# Patient Record
Sex: Male | Born: 1945 | Race: White | Hispanic: No | Marital: Married | Smoking: Current some day smoker
Health system: Southern US, Community
[De-identification: ages and names within clinical notes are randomized; demographics above are authoritative.]

## PROBLEM LIST (undated history)

## (undated) DIAGNOSIS — K5730 Diverticulosis of large intestine without perforation or abscess without bleeding: Secondary | ICD-10-CM

## (undated) DIAGNOSIS — I4891 Unspecified atrial fibrillation: Secondary | ICD-10-CM

## (undated) HISTORY — PX: ATRIAL FIBRILLATION ABLATION: SHX5732

## (undated) HISTORY — PX: VALVE REPLACEMENT: SUR13

## (undated) HISTORY — PX: HERNIA REPAIR: SHX51

---

## 1999-04-01 ENCOUNTER — Inpatient Hospital Stay (HOSPITAL_COMMUNITY): Admission: RE | Admit: 1999-04-01 | Discharge: 1999-04-07 | Payer: Self-pay | Admitting: General Surgery

## 1999-04-01 ENCOUNTER — Encounter: Payer: Self-pay | Admitting: General Surgery

## 1999-04-03 HISTORY — PX: LAPAROSCOPIC INGUINAL HERNIA REPAIR: SUR788

## 2002-11-13 ENCOUNTER — Encounter: Payer: Self-pay | Admitting: Orthopedic Surgery

## 2002-11-13 ENCOUNTER — Ambulatory Visit (HOSPITAL_COMMUNITY): Admission: RE | Admit: 2002-11-13 | Discharge: 2002-11-13 | Payer: Self-pay | Admitting: Orthopedic Surgery

## 2005-04-16 DIAGNOSIS — K56699 Other intestinal obstruction unspecified as to partial versus complete obstruction: Secondary | ICD-10-CM

## 2005-04-16 HISTORY — DX: Other intestinal obstruction unspecified as to partial versus complete obstruction: K56.699

## 2005-05-29 ENCOUNTER — Encounter: Admission: RE | Admit: 2005-05-29 | Discharge: 2005-05-29 | Payer: Self-pay | Admitting: Gastroenterology

## 2005-06-08 ENCOUNTER — Encounter (INDEPENDENT_AMBULATORY_CARE_PROVIDER_SITE_OTHER): Payer: Self-pay | Admitting: Specialist

## 2005-06-08 ENCOUNTER — Ambulatory Visit (HOSPITAL_COMMUNITY): Admission: RE | Admit: 2005-06-08 | Discharge: 2005-06-08 | Payer: Self-pay | Admitting: Gastroenterology

## 2005-07-24 ENCOUNTER — Inpatient Hospital Stay (HOSPITAL_COMMUNITY): Admission: RE | Admit: 2005-07-24 | Discharge: 2005-08-03 | Payer: Self-pay | Admitting: Surgery

## 2005-07-26 ENCOUNTER — Encounter (INDEPENDENT_AMBULATORY_CARE_PROVIDER_SITE_OTHER): Payer: Self-pay | Admitting: *Deleted

## 2005-07-26 HISTORY — PX: COLON RESECTION SIGMOID: SHX6737

## 2009-04-16 DIAGNOSIS — K8020 Calculus of gallbladder without cholecystitis without obstruction: Secondary | ICD-10-CM

## 2009-04-16 HISTORY — DX: Calculus of gallbladder without cholecystitis without obstruction: K80.20

## 2009-12-28 ENCOUNTER — Inpatient Hospital Stay (HOSPITAL_COMMUNITY): Admission: EM | Admit: 2009-12-28 | Discharge: 2009-12-29 | Payer: Self-pay | Admitting: Emergency Medicine

## 2010-06-29 LAB — T4, FREE: Free T4: 1.16 ng/dL (ref 0.80–1.80)

## 2010-06-29 LAB — COMPREHENSIVE METABOLIC PANEL
Albumin: 3.3 g/dL — ABNORMAL LOW (ref 3.5–5.2)
Albumin: 4.7 g/dL (ref 3.5–5.2)
BUN: 23 mg/dL (ref 6–23)
BUN: 23 mg/dL (ref 6–23)
Chloride: 95 mEq/L — ABNORMAL LOW (ref 96–112)
Creatinine, Ser: 1.42 mg/dL (ref 0.4–1.5)
Creatinine, Ser: 1.55 mg/dL — ABNORMAL HIGH (ref 0.4–1.5)
GFR calc Af Amer: >60 mL/min (ref >60)
GFR calc non Af Amer: 46 mL/min — ABNORMAL LOW (ref >60)
Potassium: 3.8 mEq/L (ref 3.5–5.1)
Total Bilirubin: 2.9 mg/dL — ABNORMAL HIGH (ref 0.3–1.2)
Total Protein: 6 g/dL (ref 6.0–8.3)

## 2010-06-29 LAB — URINALYSIS, ROUTINE W REFLEX MICROSCOPIC
Leukocytes, UA: NEGATIVE
Protein, ur: 30 mg/dL — AB
Urobilinogen, UA: 0.2 mg/dL (ref 0.0–1.0)

## 2010-06-29 LAB — CBC
MCH: 32.1 pg (ref 26.0–34.0)
MCH: 32 pg (ref 26.0–34.0)
MCV: 91.8 fL (ref 78.0–100.0)
MCV: 90.4 fL (ref 78.0–100.0)
Platelets: 131 10*3/uL — ABNORMAL LOW (ref 150–400)
Platelets: 172 10*3/uL (ref 150–400)
RBC: 5.88 MIL/uL — ABNORMAL HIGH (ref 4.22–5.81)
RDW: 13.6 % (ref 11.5–15.5)
WBC: 10 10*3/uL (ref 4.0–10.5)

## 2010-06-29 LAB — CULTURE, BLOOD (ROUTINE X 2): Culture  Setup Time: 201109150239

## 2010-06-29 LAB — DIFFERENTIAL
Basophils Absolute: 0 10*3/uL (ref 0.0–0.1)
Lymphocytes Relative: 18 % (ref 12–46)
Lymphocytes Relative: 7 % — ABNORMAL LOW (ref 12–46)
Monocytes Absolute: 0.9 10*3/uL (ref 0.1–1.0)
Monocytes Absolute: 1 10*3/uL (ref 0.1–1.0)
Monocytes Relative: 9 % (ref 3–12)
Neutro Abs: 7.3 10*3/uL (ref 1.7–7.7)
Neutro Abs: 17.3 10*3/uL — ABNORMAL HIGH (ref 1.7–7.7)
Neutrophils Relative %: 88 % — ABNORMAL HIGH (ref 43–77)

## 2010-06-29 LAB — PHOSPHORUS: Phosphorus: 1.9 mg/dL — ABNORMAL LOW (ref 2.3–4.6)

## 2010-06-29 LAB — PROTIME-INR
INR: 3.5 — ABNORMAL HIGH (ref 0.00–1.49)
INR: 2.47 — ABNORMAL HIGH (ref 0.00–1.49)
Prothrombin Time: 35.1 seconds — ABNORMAL HIGH (ref 11.6–15.2)
Prothrombin Time: 26.9 seconds — ABNORMAL HIGH (ref 11.6–15.2)

## 2010-06-29 LAB — TSH: TSH: 1.232 u[IU]/mL (ref 0.350–4.500)

## 2010-06-29 LAB — HEMOGLOBIN A1C
Hgb A1c MFr Bld: 5.4 % (ref <5.7)
Mean Plasma Glucose: 108 mg/dL (ref <117)

## 2010-06-29 LAB — URINE MICROSCOPIC-ADD ON

## 2010-06-29 LAB — PROCALCITONIN: Procalcitonin: 3.61 ng/mL

## 2010-06-29 LAB — LIPASE, BLOOD: Lipase: 24 U/L (ref 11–59)

## 2010-06-29 LAB — APTT: aPTT: 51 seconds — ABNORMAL HIGH (ref 24–37)

## 2010-09-01 NOTE — Op Note (Signed)
NAME:  Alan Willis, Alan Willis NO.:  [id number]   MEDICAL RECORD NO.:  [id number]          PATIENT TYPE:  INP   LOCATION:  5740                         FACILITY:  [facility]   PHYSICIAN:  Velora Heckler, MD      DATE OF BIRTH:  [birth year]   DATE OF PROCEDURE:  07/26/2005  DATE OF DISCHARGE:                                 OPERATIVE REPORT   PREOPERATIVE DIAGNOSIS:  Sigmoid colon stricture.   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   PROCEDURE:  Sigmoid colectomy.   SURGEON:  Velora Heckler, M.D., FACS   ASSISTANT:  Leonie Man, M.D.   ANESTHESIA:  General per Dr. Bedelia Person.   ESTIMATED BLOOD LOSS:  200 mL.   PREPARATION:  Betadine.   COMPLICATIONS:  None.   INDICATIONS:  The patient is a 65 year old white male from [place], [place], referred by Graylin Shiver, M.D. for sigmoid colectomy.  The  patient had had episodes of lower abdominal pain in November and December  2006.  The patient underwent screening virtual colonoscopy and sigmoidoscopy  by Dr. Evette Cristal.  This showed a significant stricture of the distal sigmoid  colon at approximately 35 cm from the anus, worrisome for malignancy.  However, biopsies were negative.  The patient now comes to surgery for  resection.   BODY OF REPORT.:  The procedure was done in OR #1 at the [facility].  The patient is brought to the operating room and placed  a in supine position on the operating room table.  Following administration  of general anesthesia, the patient is prepped and draped in the usual strict  aseptic fashion.  After ascertaining that an adequate level of anesthesia  had been obtained, a midline abdominal incision was made with a #10 blade.  Dissection is carried down through subcutaneous tissues.  Fascia is incised  in the midline and the peritoneal cavity is entered cautiously.  At the  inferior-most extent of the incision, a titanium  tack is encountered.  Mesh  material is encountered in the midline consistent with previous bilateral  laparoscopic inguinal hernia repair.  A Balfour retractor is placed for  exposure.  The abdomen is explored.  Adhesions are lysed.  Sigmoid colon  appears moderately dilated.  It then courses towards the retroperitoneum,  where it is densely adherent at the pelvic brim.  Small bowel loops are also  involved with this inflammatory process.  Using gentle blunt dissection and  limited sharp dissection, this area is separated.  Small bowel mesentery is  mobilized off of the inflammatory mass.  Distal terminal ileum is also  mobilized by incising the retroperitoneum.  The mass is then gently  dissected free from the sacral promontory and mobilized.  This all appears  to be a dense inflammatory reaction encompassing about 15 cm of sigmoid  colon.  A point proximal to the inflammatory process is selected and the  bowel is transected between bowel clamps.  Mesentery is taken down between  [facility] clamps, divided and ligated with 2-0 silk ties and 2-0  silk suture  ligatures.  Dissection is carried along the mesentery distally to posterior  to the inflammatory mass.  Mesentery is markedly thickened at this level.  However, dissection does proceed past the mass to what appears to be normal  distal sigmoid colon at the peritoneal reflection.  Remaining mesentery is  divided between [place]idge clamps and ligated with 2-0 silk ties.  The bowel is  then elevated between stay sutures and a Satinsky clamp is placed below the  inflammatory mass and the bowel is transected with a #10 blade.  Specimen is  passed off the field and submitted to pathology, where Dr. Tammi Sou  confirms an inflammatory process consistent with diverticular disease.  No  evidence of malignancy is identified on gross examination.   Next, an end-to-end anastomosis is created between the proximal sigmoid  colon and distal sigmoid colon.   This is done with a single layer of  interrupted 2-0 silk sutures.  Good approximation is obtained.  There is no  tension on the anastomosis.  Mesenteric defect is closed with interrupted 2-  0 silk sutures.  Abdomen is copiously irrigated with warm saline, which is  evacuated.  Good hemostasis was noted.  A sheet of Surgicel is placed over  the area on the retroperitoneum where the inflammatory mass had been  adherent to achieve complete hemostasis.  Abdomen is copiously irrigated  with warm saline, which was evacuated.  Viscera are returned to their normal  location.  All packs are removed.  Omentum is used to cover the small bowel.  Midline incision is closed with a running #1 Novofil suture.  Subcutaneous  tissues are irrigated.  Skin is closed with stainless steel staples.  Sterile dressings are applied.  The patient is awakened from anesthesia and  brought to the recovery room in stable condition.  The patient tolerated the  procedure well.      Velora Heckler, MD  Electronically Signed     TMG/MEDQ  D:  07/26/2005  T:  07/27/2005  Job:  [id number]   cc:   Loney Loh, M.D.  Cardiology/Internal Medicine Division  [facility]   Graylin Shiver, M.D.  Fax: [phone]   Dellis Anes. Idell Pickles, M.D.  Fax: [phone]

## 2010-09-01 NOTE — Op Note (Signed)
NAME:  Alan Willis, ENGELMANN NO.:  [id number]   MEDICAL RECORD NO.:  [id number]          PATIENT TYPE:  AMB   LOCATION:  ENDO                         FACILITY:  [facility]   PHYSICIAN:  Graylin Shiver, M.D.   DATE OF BIRTH:  [birth year]   DATE OF PROCEDURE:  06/08/2005  DATE OF DISCHARGE:                                 OPERATIVE REPORT   PROCEDURE:  Flexible sigmoidoscopy with biopsy.   INDICATIONS FOR PROCEDURE:  The patient is a 65 year old male who underwent  a screening virtual colonoscopy. This virtual colonoscopy showed a  constricting lesion in the sigmoid colon and a flexible sigmoidoscopy is  being done today to further evaluate. The constricting lesion in the sigmoid  on the virtual colonoscopy was suspicious for malignancy and I discussed  this with the patient and we proceeded with flexible sigmoidoscopy. The  patient is on Coumadin and he remained on it for this procedure. 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   Informed consent was obtained after explanation of the risks of bleeding,  infection, and perforation.   The patient was premedicated as stated on the medication review chart.   PROCEDURE:  With the patient in the left lateral decubitus position, a  rectal exam was performed and no masses were felt. The Olympus pediatric  adjustable colonoscope was inserted into the rectum and advanced into the  sigmoid colon. The scope was advanced to 30 cm. At this point, there was a  stricture noted. There were also numerous diverticula in the area. There was  acute angulation at this area also. There did appear to be some reddish  inflamed appearing mucosa which was biopsied. I did not see an obvious  tumor. However, I cannot get into the strictured area nor can I see above  it. I feel that this area must be considered malignant until proven  otherwise.  No lesions were seen in the rectum. He tolerated the procedure  well without complications.   IMPRESSION:  1.  Diverticulosis.  2.  Stricture at 30 cm with some inflamed mucosa at this area. Biopsies were      obtained for histology. I palpated the mucosa in this area, it did not      feel hard. I cannot see into the strictured area nor can I see above it.      I feel that this area must be considered malignant until proven      otherwise and will therefore refer the patient to a surgeon for further      evaluation to include sigmoid resection.           ______________________________  Graylin Shiver, M.D.     SFG/MEDQ  D:  06/08/2005  T:  06/11/2005  Job:  [id number]   cc:   Dellis Anes. Idell Pickles, M.D.  Fax: [phone]

## 2010-09-01 NOTE — Op Note (Signed)
[facility]  Patient:    Alan Willis                    MRN: [id number] Proc. Date: 04/03/99 Adm. Date:  [id number] Attending:  Arlis Porta                           Operative Report  PREOPERATIVE DIAGNOSIS:  Bilateral inguinal hernias.  POSTOPERATIVE DIAGNOSIS:  Bilateral inguinal hernias.  OPERATION PERFORMED:  Laparoscopic bilateral inguinal hernia repair with mesh.  SURGEON:  Adolph Pollack, M.D.  ASSISTANT:  Nurse.  ANESTHESIA:  General.  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  He has had an enlarging left inguinal bulge that is more painful. On exam, he has bilateral inguinal hernias left greater than right and presents for repair.  He was admitted two days early for IV heparinization and was given perioperative endocarditis prophylaxis with ampicillin and gentamicin.  DESCRIPTION OF PROCEDURE:  He was placed supine on the operating table and a general anesthetic was administered.  The lower abdomen was shaved.  The Foley catheter was placed in the bladder.  The lower abdomen was then sterilely prepped and draped.  Subumbilical transverse incision was made incising the skin sharply and carrying this down to the fascia.  A 1 cm incision was made in the right anterior rectus sheath and then the muscle identified and swept laterally creating a plane where I could see the posterior rectus sheath all the way down to the pubis.  Next, the balloon dissector was placed and balloon dissection was performed under direct vision with adequate dissection, the muscle remained anteriorly and the peritoneum was displaced posteriorly along with the preperitoneal fat.  Next, a trocar was introduced and CO2 gas was used to insufflate the extraperitoneal space.  Two 5 mm trocars were placed in the lower midline.  The  right side was approached first.  The symphysis  pubis and Coopers ligament were  identified.  A direct hernia defect was also identified on the right side.  I identified the spermatic cord, dissected this free and noticed that there was no indirect defect.  The left side was then approached.  The Coopers ligament was identified. Inferior epigastric vessels were identified and there was small direct defect.  I did isolate the spermatic cord and there was also an indirect sac present that was dissected free from the spermatic cord all the way back to the level of the anterior superior iliac spine.  A 4 x 6 inch piece of mesh was then placed in the left extraperitoneal space, anchored to Coopers ligament, the anterior abdominal wall and the lateral abdominal wall with the spiral tacker so that it covered the direct, indirect and femoral spaces adequately.  At this point I noticed that there appeared to be a  pneumoperitoneum and the Veress needle was inserted into the peritoneal cavity ith release of some CO2 gas.  Next, I placed a piece of 4 x 6 inch mesh in the right extraperitoneal space and anchored it to the Coopers ligament, the anterior abdominal wall and the lateral abdominal wall with the spiral tacker.  This also covered the direct, indirect nd femoral spaces adequately.  We then made sure the inferior lateral portions of ach of the meshes was in place and the CO2 gas was released and the extraperitoneal fat was allowed to  approximate with the mesh in the rectus muscle.  The fascial defect in the anterior rectus sheath on the right side was then closed with interrupted 0 Vicryl sutures.  The skin incisions were closed with 4-0 Monocryl subcuticular stitches.  Upon emerging from the anesthetic, the patient strained, sat up and grunted and a loud pop was heard.  We subsequently reinstituted a general anesthetic, reprepped and draped the abdomen and explored the subumbilical wound which demonstrated the sutures  had disrupted.  I then reclosed the fascia layer with interrupted 0 PDS  sutures close together.  I then irrigated this out with antibiotic solution and  reclosed the skin with a running 4-0 Monocryl subcuticular stitch followed by a  sterile dressing.  He then emerged from the anesthetic calmly and without complication.  He was then taken to the recovery room in satisfactory condition. DD:  04/03/99 TD:  04/05/99 Job: [id number]

## 2010-09-01 NOTE — Discharge Summary (Signed)
NAME:  Alan Willis, Alan Willis NO.:  [id number]   MEDICAL RECORD NO.:  [id number]          PATIENT TYPE:  INP   LOCATION:  5740                         FACILITY:  [facility]   PHYSICIAN:  Velora Heckler, MD      DATE OF BIRTH:  [birth year]   DATE OF ADMISSION:  07/24/2005  DATE OF DISCHARGE:  08/03/2005                                 DISCHARGE SUMMARY   REASON FOR ADMISSION:  Stricture sigmoid colon, rule out malignancy.   HISTORY OF PRESENT ILLNESS:  Alan Willis is a pleasant 65 year old  white male from [place], [place], referred by Dr. Wandalee Ferdinand for  sigmoid colon stricture. The patient had undergone a complete workup  including virtual colonoscopy and flexible sigmoidoscopy. He was found to  have a tight stricture at 30 cm with associated diverticulosis. Biopsies  were negative. The patient had partial obstructive symptoms. He is admitted  for sigmoid colectomy.   HOSPITAL COURSE:  The patient was admitted prior to surgery due to need for  heparin anticoagulation due to an aortic valve replacement. The patient was  placed on a heparin drip on April10,2007. He underwent a mechanical bowel  prep. He was taken to the operating room on July 26, 2005 where he  underwent sigmoid colectomy without complication. Postoperatively, the  patient was placed back on intravenous heparin. Final pathology showed acute  and chronic diverticulitis with stricture. No malignancy was identified. The  patient's diet was advanced. He was started on clear liquids and advanced to  a regular diet. He resumed his oral Coumadin therapy. The patient's heparin  levels, prothrombin time, and INR were monitored daily. The patient had a  slow gradual rise in his prothrombin time and INR. The patient was prepared  for discharge home on the eighth postoperative day.   DISCHARGE/PLAN:  The patient is discharged home on April 20,2007, in good  condition, tolerating a regular diet, ambulating  independently. The patient  will be seen back in my office at [facility] in 2-3 weeks for  a wound check. The patient will take 7.5 mg of Coumadin tonight and then  begin his regular dose of Coumadin 5 mg daily on Saturday, April 21. The  patient will go to [facility] for a prothrombin time and INR  level on Monday, April 23.   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   CONDITION ON DISCHARGE:  Improved.      Velora Heckler, MD  Electronically Signed     TMG/MEDQ  D:  08/03/2005  T:  08/03/2005  Job:  [id number]   cc:   Dellis Anes. Idell Pickles, M.D.  Fax: [phone]   Graylin Shiver, M.D.  Fax: [phone]   Redge Gainer, M.D.

## 2011-05-21 DIAGNOSIS — Z952 Presence of prosthetic heart valve: Secondary | ICD-10-CM

## 2013-04-03 ENCOUNTER — Other Ambulatory Visit: Payer: Self-pay | Admitting: Family Medicine

## 2013-04-03 DIAGNOSIS — Z87891 Personal history of nicotine dependence: Secondary | ICD-10-CM

## 2013-04-13 ENCOUNTER — Ambulatory Visit
Admission: RE | Admit: 2013-04-13 | Discharge: 2013-04-13 | Disposition: A | Discharge status: Home / Self Care | Payer: Medicare Other | Source: Ambulatory Visit | Attending: Family Medicine | Admitting: Family Medicine

## 2013-04-13 DIAGNOSIS — Z87891 Personal history of nicotine dependence: Secondary | ICD-10-CM

## 2014-02-17 ENCOUNTER — Other Ambulatory Visit: Payer: Self-pay | Admitting: Dermatology

## 2017-02-05 ENCOUNTER — Other Ambulatory Visit: Payer: Self-pay | Admitting: Dermatology

## 2019-05-11 ENCOUNTER — Ambulatory Visit: Payer: Medicare Other | Attending: Internal Medicine

## 2019-05-11 DIAGNOSIS — Z23 Encounter for immunization: Secondary | ICD-10-CM | POA: Insufficient documentation

## 2019-05-11 NOTE — Progress Notes (Addendum)
   Covid-19 Vaccination Clinic  Name:  Alan Willis    MRN: [id number] DOB: May 06, 1945  05/11/2019  Mr. Badie was observed post Covid-19 immunization for 30 minutes based on pre-vaccination screening without incidence. He was provided with Vaccine Information Sheet and instruction to access the V-Safe system.   Mr. Chriscoe was instructed to call 911 with any severe reactions post vaccine: Marland Kitchen Difficulty breathing  . Swelling of your face and throat  . A fast heartbeat  . A bad rash all over your body  . Dizziness and weakness    Immunizations Administered    Name Date Dose VIS Date Route   Pfizer COVID-19 Vaccine 05/11/2019  8:21 AM 0.3 mL 03/27/2019 Intramuscular   Manufacturer: [employer], [employer]   Lot: [id number]   NDC: [address]-3

## 2019-06-02 ENCOUNTER — Ambulatory Visit: Payer: Medicare Other

## 2019-06-05 ENCOUNTER — Ambulatory Visit: Payer: Medicare Other

## 2019-06-07 ENCOUNTER — Ambulatory Visit: Payer: Medicare Other | Attending: Internal Medicine

## 2019-06-07 DIAGNOSIS — Z23 Encounter for immunization: Secondary | ICD-10-CM | POA: Insufficient documentation

## 2019-06-07 NOTE — Progress Notes (Signed)
   Covid-19 Vaccination Clinic  Name:  Alan Willis    MRN: [id number] DOB: [birth year]  06/07/2019  Alan Willis was observed post Covid-19 immunization for 15 minutes without incidence. He was provided with Vaccine Information Sheet and instruction to access the V-Safe system.   Alan Willis was instructed to call 911 with any severe reactions post vaccine: Marland Kitchen Difficulty breathing  . Swelling of your face and throat  . A fast heartbeat  . A bad rash all over your body  . Dizziness and weakness    Immunizations Administered    Name Date Dose VIS Date Route   Pfizer COVID-19 Vaccine 06/07/2019  8:50 AM 0.3 mL 03/27/2019 Intramuscular   Manufacturer: [employer], [employer]   Lot: [id number]   NDC: [address]-6

## 2019-07-14 DIAGNOSIS — I050 Rheumatic mitral stenosis: Secondary | ICD-10-CM | POA: Insufficient documentation

## 2019-07-14 DIAGNOSIS — I051 Rheumatic mitral insufficiency: Secondary | ICD-10-CM | POA: Insufficient documentation

## 2020-01-08 ENCOUNTER — Ambulatory Visit: Payer: Medicare Other | Attending: Internal Medicine

## 2020-01-08 DIAGNOSIS — Z23 Encounter for immunization: Secondary | ICD-10-CM

## 2020-01-08 NOTE — Progress Notes (Signed)
   Covid-19 Vaccination Clinic  Name:  Alan Willis    MRN: [id number] DOB: 07-07-1945  01/08/2020  Alan Willis was observed post Covid-19 immunization for 15 minutes without incident. He was provided with Vaccine Information Sheet and instruction to access the V-Safe system. Vaccinated by [facility].  Alan Willis was instructed to call 911 with any severe reactions post vaccine: Marland Kitchen Difficulty breathing  . Swelling of face and throat  . A fast heartbeat  . A bad rash all over body  . Dizziness and weakness

## 2020-04-14 ENCOUNTER — Emergency Department (HOSPITAL_BASED_OUTPATIENT_CLINIC_OR_DEPARTMENT_OTHER): Payer: Medicare Other

## 2020-04-14 ENCOUNTER — Encounter (HOSPITAL_BASED_OUTPATIENT_CLINIC_OR_DEPARTMENT_OTHER): Payer: Self-pay

## 2020-04-14 ENCOUNTER — Other Ambulatory Visit: Payer: Self-pay

## 2020-04-14 ENCOUNTER — Emergency Department (HOSPITAL_BASED_OUTPATIENT_CLINIC_OR_DEPARTMENT_OTHER)
Admission: EM | Admit: 2020-04-14 | Discharge: 2020-04-14 | Disposition: A | Discharge status: Home / Self Care | Payer: Medicare Other | Attending: Emergency Medicine | Admitting: Emergency Medicine

## 2020-04-14 DIAGNOSIS — I700 Atherosclerosis of aorta: Secondary | ICD-10-CM | POA: Insufficient documentation

## 2020-04-14 DIAGNOSIS — Z20822 Contact with and (suspected) exposure to covid-19: Secondary | ICD-10-CM | POA: Diagnosis not present

## 2020-04-14 DIAGNOSIS — I4891 Unspecified atrial fibrillation: Secondary | ICD-10-CM | POA: Diagnosis not present

## 2020-04-14 DIAGNOSIS — R404 Transient alteration of awareness: Secondary | ICD-10-CM

## 2020-04-14 DIAGNOSIS — E871 Hypo-osmolality and hyponatremia: Secondary | ICD-10-CM | POA: Diagnosis not present

## 2020-04-14 DIAGNOSIS — Z7901 Long term (current) use of anticoagulants: Secondary | ICD-10-CM | POA: Insufficient documentation

## 2020-04-14 DIAGNOSIS — R6883 Chills (without fever): Secondary | ICD-10-CM

## 2020-04-14 DIAGNOSIS — L0291 Cutaneous abscess, unspecified: Secondary | ICD-10-CM

## 2020-04-14 DIAGNOSIS — R778 Other specified abnormalities of plasma proteins: Secondary | ICD-10-CM | POA: Diagnosis not present

## 2020-04-14 HISTORY — DX: Unspecified atrial fibrillation: I48.91

## 2020-04-14 LAB — CBC
HCT: 39.7 % (ref 39.0–52.0)
Hemoglobin: 13.9 g/dL (ref 13.0–17.0)
MCH: 31.8 pg (ref 26.0–34.0)
MCHC: 35 g/dL (ref 30.0–36.0)
MCV: 90.8 fL (ref 80.0–100.0)
Platelets: 150 10*3/uL (ref 150–400)
RBC: 4.37 MIL/uL (ref 4.22–5.81)
RDW: 13.2 % (ref 11.5–15.5)
WBC: 10 10*3/uL (ref 4.0–10.5)
nRBC: 0 % (ref 0.0–0.2)

## 2020-04-14 LAB — TROPONIN I (HIGH SENSITIVITY)
Troponin I (High Sensitivity): 24 ng/L — ABNORMAL HIGH (ref <18)
Troponin I (High Sensitivity): 25 ng/L — ABNORMAL HIGH (ref <18)

## 2020-04-14 LAB — URINALYSIS, ROUTINE W REFLEX MICROSCOPIC
Bilirubin Urine: NEGATIVE
Glucose, UA: NEGATIVE mg/dL
Hgb urine dipstick: NEGATIVE
Ketones, ur: NEGATIVE mg/dL
Leukocytes,Ua: NEGATIVE
Nitrite: NEGATIVE
Protein, ur: NEGATIVE mg/dL
Specific Gravity, Urine: 1.005 (ref 1.005–1.030)
pH: 6.5 (ref 5.0–8.0)

## 2020-04-14 LAB — CBG MONITORING, ED: Glucose-Capillary: 100 mg/dL — ABNORMAL HIGH (ref 70–99)

## 2020-04-14 LAB — COMPREHENSIVE METABOLIC PANEL
ALT: 19 U/L (ref 0–44)
AST: 31 U/L (ref 15–41)
Albumin: 4.3 g/dL (ref 3.5–5.0)
Alkaline Phosphatase: 42 U/L (ref 38–126)
Anion gap: 10 (ref 5–15)
BUN: 8 mg/dL (ref 8–23)
CO2: 23 mmol/L (ref 22–32)
Calcium: 8.7 mg/dL — ABNORMAL LOW (ref 8.9–10.3)
Chloride: 92 mmol/L — ABNORMAL LOW (ref 98–111)
Creatinine, Ser: 1.02 mg/dL (ref 0.61–1.24)
GFR, Estimated: >60 mL/min (ref >60)
Glucose, Bld: 94 mg/dL (ref 70–99)
Potassium: 3.9 mmol/L (ref 3.5–5.1)
Sodium: 125 mmol/L — ABNORMAL LOW (ref 135–145)
Total Bilirubin: 1.8 mg/dL — ABNORMAL HIGH (ref 0.3–1.2)
Total Protein: 6.6 g/dL (ref 6.5–8.1)

## 2020-04-14 LAB — PROTIME-INR
INR: 1.7 — ABNORMAL HIGH (ref 0.8–1.2)
Prothrombin Time: 19.1 seconds — ABNORMAL HIGH (ref 11.4–15.2)

## 2020-04-14 LAB — SARS CORONAVIRUS 2 (TAT 6-24 HRS): SARS Coronavirus 2: NEGATIVE

## 2020-04-14 MED ORDER — SODIUM CHLORIDE 0.9 % IV SOLN: INTRAVENOUS | Status: DC

## 2020-04-14 MED ORDER — SODIUM CHLORIDE 0.9 % IV BOLUS
500.0000 mL | Freq: Once | INTRAVENOUS | Status: AC
  Administered 2020-04-14: 500 mL via INTRAVENOUS

## 2020-04-14 MED ORDER — IOHEXOL 300 MG/ML  SOLN
100.0000 mL | Freq: Once | INTRAMUSCULAR | Status: AC | PRN
  Administered 2020-04-14: 100 mL via INTRAVENOUS

## 2020-04-14 NOTE — ED Notes (Signed)
Pt. And family were made aware that a urine sample is needed, but could not provide one right now

## 2020-04-14 NOTE — Discharge Instructions (Addendum)
Today received some salt water help bring her sodium up.  You could add some salt to your food.  Just since you are having trouble with the sodium being low.  Would recommend following back up with your primary care doctor and also consideration for an endocrinology evaluation.  Would also consider MRI brain.  And recheck of your sodium.

## 2020-04-14 NOTE — ED Notes (Signed)
PT to CT.

## 2020-04-14 NOTE — ED Notes (Addendum)
Pt had symptoms of flu 10 days ago 12/23 neg covid.  Excessive thirst, chills and confusion.  Been to [facility] walkin clinic, uti negative. Hypothyroid, diabetes all negative.  Low sodium. Pro time is out of balance. Afib in April. Has artificial valve. Pt also complains of headache taking tylenol with relieves pain

## 2020-04-14 NOTE — ED Notes (Signed)
Pt is on the monitor and cycling vitals

## 2020-04-14 NOTE — ED Triage Notes (Signed)
Pt states "I've been having a lot of confusion and running a fever" x 1 week-alert to name/DOB/ place/date-NAD-steady gait-wife states pt with chills/no known fever-states pt has been seen by PCP yesterday and today-states unable to find cause for c/o-neg covid test 12/23 and states pt had flu like sx at that time

## 2020-04-14 NOTE — ED Provider Notes (Addendum)
[facility] EMERGENCY DEPARTMENT Provider Note   CSN: [id number] Arrival date & time: 04/14/20  1128     History Chief Complaint  Patient presents with  . Altered Mental Status    Alan Willis is a 74 y.o. male.  Patient has been seen frequently by [facility] physicians.  Trying to sort out his complaints and his persistent hyponatremia.  Patient had a negative Covid test on December 23.  Patient symptoms have been ongoing for 10 days.  Said excessive thirst chills episodic confusion other times completely normal.  His work-up at [facility] physicians has been negative for UTI hypothyroid diabetes.  Has had some persistent low sodium which they have been able to explain.  His INR was elevated and they stopped his Coumadin.  He just recently restarted that.  Patient has a history of atrial fibrillation in April and has an artificial valve.  Patient had also has complaints of headache taking Tylenol which relieves the pain.  But they did her headaches do come and go.  [facility] physicians recommended that he come in here for additional work-up.  No specific instructions.        Past Medical History:  Diagnosis Date  . A-fib (HCC)     There are no problems to display for this patient.   Past Surgical History:  Procedure Laterality Date  . ATRIAL FIBRILLATION ABLATION    . HERNIA REPAIR    . VALVE REPLACEMENT         No family history on file.  Social History   Tobacco Use  . Smoking status: Former Games developer  . Smokeless tobacco: Never Used  Substance Use Topics  . Alcohol use: Yes    Comment: daily  . Drug use: Never    Home Medications Prior to Admission medications   Medication Sig Start Date End Date Taking? Authorizing Provider  warfarin (COUMADIN) 6 MG tablet Take 6 mg by mouth daily. Friday, Saturday Sunday retest monday   Yes [provider]    Allergies    Codeine  Review of Systems   Review of Systems  Constitutional: Positive for chills.  Negative for fever.  HENT: Negative for congestion, rhinorrhea and sore throat.   Eyes: Negative for visual disturbance.  Respiratory: Negative for cough and shortness of breath.   Cardiovascular: Negative for chest pain and leg swelling.  Gastrointestinal: Negative for abdominal pain, diarrhea, nausea and vomiting.  Endocrine: Positive for polyphagia and polyuria.  Genitourinary: Negative for dysuria.  Musculoskeletal: Negative for back pain and neck pain.  Skin: Negative for rash.  Neurological: Positive for headaches. Negative for dizziness and light-headedness.  Hematological: Does not bruise/bleed easily.  Psychiatric/Behavioral: Positive for confusion.    Physical Exam Updated Vital Signs BP 132/72 (BP Location: Right Arm)   Pulse (!) 50   Temp 97.8 F (36.6 C) (Oral)   Resp 18   Ht 1.778 m (5\' 10" )   Wt 78 kg   SpO2 100%   BMI 24.68 kg/m   Physical Exam Vitals and nursing note reviewed.  Constitutional:      Appearance: Normal appearance. He is well-developed and well-nourished.  HENT:     Head: Normocephalic and atraumatic.     Mouth/Throat:     Mouth: Mucous membranes are moist.  Eyes:     Extraocular Movements: Extraocular movements intact.     Conjunctiva/sclera: Conjunctivae normal.     Pupils: Pupils are equal, round, and reactive to light.  Cardiovascular:     Rate and  Rhythm: Normal rate and regular rhythm.     Heart sounds: No murmur heard.   Pulmonary:     Effort: Pulmonary effort is normal. No respiratory distress.     Breath sounds: Normal breath sounds.  Abdominal:     Palpations: Abdomen is soft.     Tenderness: There is no abdominal tenderness.  Musculoskeletal:        General: No edema. Normal range of motion.     Cervical back: Normal range of motion and neck supple.  Skin:    General: Skin is warm and dry.     Capillary Refill: Capillary refill takes less than 2 seconds.  Neurological:     General: No focal deficit present.      Mental Status: He is alert and oriented to person, place, and time.     Cranial Nerves: No cranial nerve deficit.     Sensory: No sensory deficit.     Motor: No weakness.     Coordination: Coordination normal.  Psychiatric:        Mood and Affect: Mood and affect normal.     ED Results / Procedures / Treatments   Labs (all labs ordered are listed, but only abnormal results are displayed) Labs Reviewed  COMPREHENSIVE METABOLIC PANEL - Abnormal; Notable for the following components:      Result Value   Sodium 125 (*)    Chloride 92 (*)    Calcium 8.7 (*)    Total Bilirubin 1.8 (*)    All other components within normal limits  CBG MONITORING, ED - Abnormal; Notable for the following components:   Glucose-Capillary 100 (*)    All other components within normal limits  TROPONIN I (HIGH SENSITIVITY) - Abnormal; Notable for the following components:   Troponin I (High Sensitivity) 24 (*)    All other components within normal limits  SARS CORONAVIRUS 2 (TAT 6-24 HRS)  CBC  URINALYSIS, ROUTINE W REFLEX MICROSCOPIC  PROTIME-INR    EKG EKG Interpretation  Date/Time:  Thursday April 14 2020 11:51:45 EST Ventricular Rate:  53 PR Interval:  240 QRS Duration: 128 QT Interval:  456 QTC Calculation: 427 R Axis:   -42 Text Interpretation: Sinus bradycardia with 1st degree A-V block Left axis deviation Non-specific intra-ventricular conduction block Minimal voltage criteria for LVH, may be normal variant ( Cornell product ) Abnormal ECG ST elevation in V3-4 Otherwise no significant change Confirmed by Vanetta Mulders [phone]) on 04/14/2020 12:29:12 PM   Radiology DG Chest Port 1 View  Result Date: 04/14/2020 CLINICAL DATA:  Confusion, fever EXAM: PORTABLE CHEST 1 VIEW COMPARISON:  07/24/2005 FINDINGS: No focal consolidation. No pleural effusion or pneumothorax. Heart and mediastinal contours are unremarkable. Prior median sternotomy. No acute osseous abnormality. IMPRESSION: No  active disease. Electronically Signed   By: Elige Ko   On: 04/14/2020 12:50    Procedures Procedures (including critical care time)  Medications Ordered in ED Medications  0.9 %  sodium chloride infusion (has no administration in time range)  sodium chloride 0.9 % bolus 500 mL (500 mLs Intravenous New Bag/Given 04/14/20 1355)    ED Course  I have reviewed the triage vital signs and the nursing notes.  Pertinent labs & imaging results that were available during my care of the patient were reviewed by me and considered in my medical decision making (see chart for details).    MDM Rules/Calculators/A&P  Patient's work-up here troponins x2 without any acute findings.  His INR is 1.7 still little below therapeutic but he is just recently been started back on his Coumadin.  Sodium is 125 definitely low.  But necessarily in a totally critical level.  And his confusion and mental status changes are not persistent they are intermittent.  No good explanation for the hyponatremia.  Not on a diuretic.  But has had the increased urination and thirst.  Assume he was sent in for radiology studies by primary care so we will do chest x-ray which was done no acute findings on that.  CT head has no acute findings.  CT chest abdomen pelvis still pending.  If no significant findings on that would recommend follow back up to [facility] physicians may be consider endocrine follow-up.  Since patient's last Covid test was on the 23rd.  Have ordered the repeat on the 6 to 12-hour test.  This to be complete.  Patient currently nontoxic no acute distress  CT chest abdomen pelvis without any acute findings to explain the symptomatology.  Patient stable for discharge back.  Repeat Covid test is pending and they can follow that up on MyChart.  Will recommend going back to primary care doctor consideration for MRI brain and may be an endocrinology referral.  Since patient's mental status is  fine currently.  Do not feel that he needs admission for the hyponatremia.  Certainly has no depressed mental status.  And the mental status changes are episodic in nature.   Final Clinical Impression(s) / ED Diagnoses Final diagnoses:  Transient alteration of awareness  Hyponatremia  Chills (without fever)  Elevated troponin    Rx / DC Orders ED Discharge Orders    None       Vanetta Mulders, MD 04/14/20 1526    Vanetta Mulders, MD 04/14/20 1538    Vanetta Mulders, MD 04/14/20 [phone]

## 2020-04-18 DIAGNOSIS — Z952 Presence of prosthetic heart valve: Secondary | ICD-10-CM | POA: Diagnosis not present

## 2020-04-18 DIAGNOSIS — E871 Hypo-osmolality and hyponatremia: Secondary | ICD-10-CM | POA: Diagnosis not present

## 2020-04-18 DIAGNOSIS — R631 Polydipsia: Secondary | ICD-10-CM | POA: Diagnosis not present

## 2020-04-18 DIAGNOSIS — Z7901 Long term (current) use of anticoagulants: Secondary | ICD-10-CM | POA: Diagnosis not present

## 2020-04-25 DIAGNOSIS — Z952 Presence of prosthetic heart valve: Secondary | ICD-10-CM | POA: Diagnosis not present

## 2020-04-25 DIAGNOSIS — E871 Hypo-osmolality and hyponatremia: Secondary | ICD-10-CM | POA: Diagnosis not present

## 2020-04-25 DIAGNOSIS — N1831 Chronic kidney disease, stage 3a: Secondary | ICD-10-CM | POA: Diagnosis not present

## 2020-04-25 DIAGNOSIS — R413 Other amnesia: Secondary | ICD-10-CM | POA: Diagnosis not present

## 2020-05-16 DIAGNOSIS — Z7901 Long term (current) use of anticoagulants: Secondary | ICD-10-CM | POA: Diagnosis not present

## 2020-05-16 DIAGNOSIS — E871 Hypo-osmolality and hyponatremia: Secondary | ICD-10-CM | POA: Diagnosis not present

## 2020-06-13 DIAGNOSIS — Z7901 Long term (current) use of anticoagulants: Secondary | ICD-10-CM | POA: Diagnosis not present

## 2020-06-15 DIAGNOSIS — R413 Other amnesia: Secondary | ICD-10-CM | POA: Diagnosis not present

## 2020-06-15 DIAGNOSIS — R631 Polydipsia: Secondary | ICD-10-CM | POA: Diagnosis not present

## 2020-06-15 DIAGNOSIS — E871 Hypo-osmolality and hyponatremia: Secondary | ICD-10-CM | POA: Diagnosis not present

## 2020-06-15 DIAGNOSIS — N1831 Chronic kidney disease, stage 3a: Secondary | ICD-10-CM | POA: Diagnosis not present

## 2020-06-20 DIAGNOSIS — Z7901 Long term (current) use of anticoagulants: Secondary | ICD-10-CM | POA: Diagnosis not present

## 2020-07-11 DIAGNOSIS — Z7901 Long term (current) use of anticoagulants: Secondary | ICD-10-CM | POA: Diagnosis not present

## 2020-07-26 DIAGNOSIS — Z952 Presence of prosthetic heart valve: Secondary | ICD-10-CM | POA: Diagnosis not present

## 2020-07-26 DIAGNOSIS — E871 Hypo-osmolality and hyponatremia: Secondary | ICD-10-CM | POA: Diagnosis not present

## 2020-07-26 DIAGNOSIS — R413 Other amnesia: Secondary | ICD-10-CM | POA: Diagnosis not present

## 2020-07-26 DIAGNOSIS — Z1211 Encounter for screening for malignant neoplasm of colon: Secondary | ICD-10-CM | POA: Diagnosis not present

## 2020-07-26 DIAGNOSIS — N1831 Chronic kidney disease, stage 3a: Secondary | ICD-10-CM | POA: Diagnosis not present

## 2020-07-26 DIAGNOSIS — Z7901 Long term (current) use of anticoagulants: Secondary | ICD-10-CM | POA: Diagnosis not present

## 2020-08-03 DIAGNOSIS — Z7901 Long term (current) use of anticoagulants: Secondary | ICD-10-CM | POA: Diagnosis not present

## 2020-08-12 DIAGNOSIS — Z7901 Long term (current) use of anticoagulants: Secondary | ICD-10-CM | POA: Diagnosis not present

## 2020-08-16 DIAGNOSIS — Z7901 Long term (current) use of anticoagulants: Secondary | ICD-10-CM | POA: Diagnosis not present

## 2020-08-16 DIAGNOSIS — E871 Hypo-osmolality and hyponatremia: Secondary | ICD-10-CM | POA: Diagnosis not present

## 2020-08-16 DIAGNOSIS — Z952 Presence of prosthetic heart valve: Secondary | ICD-10-CM | POA: Diagnosis not present

## 2020-08-16 DIAGNOSIS — I4892 Unspecified atrial flutter: Secondary | ICD-10-CM | POA: Diagnosis not present

## 2020-08-16 DIAGNOSIS — I052 Rheumatic mitral stenosis with insufficiency: Secondary | ICD-10-CM | POA: Diagnosis not present

## 2020-08-16 DIAGNOSIS — I483 Typical atrial flutter: Secondary | ICD-10-CM | POA: Diagnosis not present

## 2020-08-19 DIAGNOSIS — Z7901 Long term (current) use of anticoagulants: Secondary | ICD-10-CM | POA: Diagnosis not present

## 2020-08-22 DIAGNOSIS — Z7901 Long term (current) use of anticoagulants: Secondary | ICD-10-CM | POA: Diagnosis not present

## 2020-08-22 DIAGNOSIS — E871 Hypo-osmolality and hyponatremia: Secondary | ICD-10-CM | POA: Diagnosis not present

## 2020-08-23 DIAGNOSIS — Z0000 Encounter for general adult medical examination without abnormal findings: Secondary | ICD-10-CM | POA: Diagnosis not present

## 2020-08-29 DIAGNOSIS — Z952 Presence of prosthetic heart valve: Secondary | ICD-10-CM | POA: Diagnosis not present

## 2020-08-29 DIAGNOSIS — I440 Atrioventricular block, first degree: Secondary | ICD-10-CM | POA: Diagnosis not present

## 2020-08-29 DIAGNOSIS — Z7901 Long term (current) use of anticoagulants: Secondary | ICD-10-CM | POA: Diagnosis not present

## 2020-08-29 DIAGNOSIS — R001 Bradycardia, unspecified: Secondary | ICD-10-CM | POA: Diagnosis not present

## 2020-08-29 DIAGNOSIS — I444 Left anterior fascicular block: Secondary | ICD-10-CM | POA: Diagnosis not present

## 2020-08-29 DIAGNOSIS — E871 Hypo-osmolality and hyponatremia: Secondary | ICD-10-CM | POA: Diagnosis not present

## 2020-08-29 DIAGNOSIS — I4892 Unspecified atrial flutter: Secondary | ICD-10-CM | POA: Diagnosis not present

## 2020-08-29 DIAGNOSIS — I340 Nonrheumatic mitral (valve) insufficiency: Secondary | ICD-10-CM | POA: Diagnosis not present

## 2020-08-30 DIAGNOSIS — Z7901 Long term (current) use of anticoagulants: Secondary | ICD-10-CM | POA: Diagnosis not present

## 2020-08-31 DIAGNOSIS — R001 Bradycardia, unspecified: Secondary | ICD-10-CM | POA: Diagnosis not present

## 2020-08-31 DIAGNOSIS — I444 Left anterior fascicular block: Secondary | ICD-10-CM | POA: Diagnosis not present

## 2020-08-31 DIAGNOSIS — I440 Atrioventricular block, first degree: Secondary | ICD-10-CM | POA: Diagnosis not present

## 2020-09-06 DIAGNOSIS — Z7901 Long term (current) use of anticoagulants: Secondary | ICD-10-CM | POA: Diagnosis not present

## 2020-09-13 DIAGNOSIS — Z7901 Long term (current) use of anticoagulants: Secondary | ICD-10-CM | POA: Diagnosis not present

## 2020-09-15 DIAGNOSIS — R6883 Chills (without fever): Secondary | ICD-10-CM | POA: Diagnosis not present

## 2020-09-15 DIAGNOSIS — R631 Polydipsia: Secondary | ICD-10-CM | POA: Diagnosis not present

## 2020-09-15 DIAGNOSIS — R519 Headache, unspecified: Secondary | ICD-10-CM | POA: Diagnosis not present

## 2020-09-15 DIAGNOSIS — E871 Hypo-osmolality and hyponatremia: Secondary | ICD-10-CM | POA: Diagnosis not present

## 2020-09-15 DIAGNOSIS — Z8349 Family history of other endocrine, nutritional and metabolic diseases: Secondary | ICD-10-CM | POA: Diagnosis not present

## 2020-09-19 DIAGNOSIS — E871 Hypo-osmolality and hyponatremia: Secondary | ICD-10-CM | POA: Diagnosis not present

## 2020-09-19 DIAGNOSIS — Z8349 Family history of other endocrine, nutritional and metabolic diseases: Secondary | ICD-10-CM | POA: Diagnosis not present

## 2020-09-19 DIAGNOSIS — R519 Headache, unspecified: Secondary | ICD-10-CM | POA: Diagnosis not present

## 2020-09-19 DIAGNOSIS — R6883 Chills (without fever): Secondary | ICD-10-CM | POA: Diagnosis not present

## 2020-09-26 DIAGNOSIS — Z7901 Long term (current) use of anticoagulants: Secondary | ICD-10-CM | POA: Diagnosis not present

## 2020-09-30 DIAGNOSIS — Z7901 Long term (current) use of anticoagulants: Secondary | ICD-10-CM | POA: Diagnosis not present

## 2020-10-05 DIAGNOSIS — R631 Polydipsia: Secondary | ICD-10-CM | POA: Diagnosis not present

## 2020-10-05 DIAGNOSIS — R519 Headache, unspecified: Secondary | ICD-10-CM | POA: Diagnosis not present

## 2020-10-10 DIAGNOSIS — Z7901 Long term (current) use of anticoagulants: Secondary | ICD-10-CM | POA: Diagnosis not present

## 2020-10-14 DIAGNOSIS — Z7901 Long term (current) use of anticoagulants: Secondary | ICD-10-CM | POA: Diagnosis not present

## 2020-10-21 DIAGNOSIS — Z7901 Long term (current) use of anticoagulants: Secondary | ICD-10-CM | POA: Diagnosis not present

## 2020-10-31 DIAGNOSIS — Z7901 Long term (current) use of anticoagulants: Secondary | ICD-10-CM | POA: Diagnosis not present

## 2020-11-02 ENCOUNTER — Other Ambulatory Visit: Payer: Self-pay | Admitting: Internal Medicine

## 2020-11-02 DIAGNOSIS — R631 Polydipsia: Secondary | ICD-10-CM

## 2020-11-02 DIAGNOSIS — R519 Headache, unspecified: Secondary | ICD-10-CM

## 2020-11-04 DIAGNOSIS — Z7901 Long term (current) use of anticoagulants: Secondary | ICD-10-CM | POA: Diagnosis not present

## 2020-11-18 DIAGNOSIS — Z7901 Long term (current) use of anticoagulants: Secondary | ICD-10-CM | POA: Diagnosis not present

## 2020-11-25 DIAGNOSIS — Z7901 Long term (current) use of anticoagulants: Secondary | ICD-10-CM | POA: Diagnosis not present

## 2020-12-02 DIAGNOSIS — Z7901 Long term (current) use of anticoagulants: Secondary | ICD-10-CM | POA: Diagnosis not present

## 2020-12-16 DIAGNOSIS — Z7901 Long term (current) use of anticoagulants: Secondary | ICD-10-CM | POA: Diagnosis not present

## 2020-12-23 DIAGNOSIS — Z7901 Long term (current) use of anticoagulants: Secondary | ICD-10-CM | POA: Diagnosis not present

## 2020-12-26 ENCOUNTER — Other Ambulatory Visit: Payer: Self-pay

## 2020-12-26 ENCOUNTER — Ambulatory Visit
Admission: RE | Admit: 2020-12-26 | Discharge: 2020-12-26 | Disposition: A | Discharge status: Home / Self Care | Payer: Medicare Other | Source: Ambulatory Visit | Attending: Internal Medicine | Admitting: Internal Medicine

## 2020-12-26 DIAGNOSIS — R519 Headache, unspecified: Secondary | ICD-10-CM

## 2020-12-26 DIAGNOSIS — R631 Polydipsia: Secondary | ICD-10-CM

## 2020-12-26 DIAGNOSIS — R410 Disorientation, unspecified: Secondary | ICD-10-CM | POA: Diagnosis not present

## 2020-12-26 MED ORDER — GADOBENATE DIMEGLUMINE 529 MG/ML IV SOLN
10.0000 mL | Freq: Once | INTRAVENOUS | Status: AC | PRN
  Administered 2020-12-26: 10 mL via INTRAVENOUS

## 2020-12-30 DIAGNOSIS — Z7901 Long term (current) use of anticoagulants: Secondary | ICD-10-CM | POA: Diagnosis not present

## 2021-01-06 DIAGNOSIS — Z7901 Long term (current) use of anticoagulants: Secondary | ICD-10-CM | POA: Diagnosis not present

## 2021-01-20 DIAGNOSIS — Z7901 Long term (current) use of anticoagulants: Secondary | ICD-10-CM | POA: Diagnosis not present

## 2021-01-27 DIAGNOSIS — Z7901 Long term (current) use of anticoagulants: Secondary | ICD-10-CM | POA: Diagnosis not present

## 2021-02-01 DIAGNOSIS — I342 Nonrheumatic mitral (valve) stenosis: Secondary | ICD-10-CM | POA: Diagnosis not present

## 2021-02-01 DIAGNOSIS — I483 Typical atrial flutter: Secondary | ICD-10-CM | POA: Diagnosis not present

## 2021-02-01 DIAGNOSIS — Z952 Presence of prosthetic heart valve: Secondary | ICD-10-CM | POA: Diagnosis not present

## 2021-02-01 DIAGNOSIS — I340 Nonrheumatic mitral (valve) insufficiency: Secondary | ICD-10-CM | POA: Diagnosis not present

## 2021-02-01 DIAGNOSIS — I371 Nonrheumatic pulmonary valve insufficiency: Secondary | ICD-10-CM | POA: Diagnosis not present

## 2021-02-06 DIAGNOSIS — Z7901 Long term (current) use of anticoagulants: Secondary | ICD-10-CM | POA: Diagnosis not present

## 2021-02-13 DIAGNOSIS — Z7901 Long term (current) use of anticoagulants: Secondary | ICD-10-CM | POA: Diagnosis not present

## 2021-02-27 DIAGNOSIS — Z7901 Long term (current) use of anticoagulants: Secondary | ICD-10-CM | POA: Diagnosis not present

## 2021-02-28 DIAGNOSIS — I4892 Unspecified atrial flutter: Secondary | ICD-10-CM | POA: Diagnosis not present

## 2021-02-28 DIAGNOSIS — I483 Typical atrial flutter: Secondary | ICD-10-CM | POA: Diagnosis not present

## 2021-02-28 DIAGNOSIS — I340 Nonrheumatic mitral (valve) insufficiency: Secondary | ICD-10-CM | POA: Diagnosis not present

## 2021-02-28 DIAGNOSIS — I517 Cardiomegaly: Secondary | ICD-10-CM | POA: Diagnosis not present

## 2021-02-28 DIAGNOSIS — Z952 Presence of prosthetic heart valve: Secondary | ICD-10-CM | POA: Diagnosis not present

## 2021-02-28 DIAGNOSIS — I342 Nonrheumatic mitral (valve) stenosis: Secondary | ICD-10-CM | POA: Diagnosis not present

## 2021-03-06 DIAGNOSIS — Z7901 Long term (current) use of anticoagulants: Secondary | ICD-10-CM | POA: Diagnosis not present

## 2021-03-13 DIAGNOSIS — Z7901 Long term (current) use of anticoagulants: Secondary | ICD-10-CM | POA: Diagnosis not present

## 2021-03-14 DIAGNOSIS — I340 Nonrheumatic mitral (valve) insufficiency: Secondary | ICD-10-CM | POA: Diagnosis not present

## 2021-03-14 DIAGNOSIS — I499 Cardiac arrhythmia, unspecified: Secondary | ICD-10-CM | POA: Diagnosis not present

## 2021-03-14 DIAGNOSIS — Z8679 Personal history of other diseases of the circulatory system: Secondary | ICD-10-CM | POA: Diagnosis not present

## 2021-03-14 DIAGNOSIS — Z952 Presence of prosthetic heart valve: Secondary | ICD-10-CM | POA: Diagnosis not present

## 2021-03-14 DIAGNOSIS — I050 Rheumatic mitral stenosis: Secondary | ICD-10-CM | POA: Diagnosis not present

## 2021-03-15 DIAGNOSIS — I509 Heart failure, unspecified: Secondary | ICD-10-CM | POA: Diagnosis not present

## 2021-03-15 DIAGNOSIS — I484 Atypical atrial flutter: Secondary | ICD-10-CM | POA: Diagnosis not present

## 2021-03-15 DIAGNOSIS — J90 Pleural effusion, not elsewhere classified: Secondary | ICD-10-CM | POA: Diagnosis not present

## 2021-03-15 DIAGNOSIS — I440 Atrioventricular block, first degree: Secondary | ICD-10-CM | POA: Diagnosis not present

## 2021-03-15 DIAGNOSIS — R0682 Tachypnea, not elsewhere classified: Secondary | ICD-10-CM | POA: Diagnosis not present

## 2021-03-15 DIAGNOSIS — I052 Rheumatic mitral stenosis with insufficiency: Secondary | ICD-10-CM | POA: Diagnosis not present

## 2021-03-15 DIAGNOSIS — I444 Left anterior fascicular block: Secondary | ICD-10-CM | POA: Diagnosis not present

## 2021-03-15 DIAGNOSIS — J811 Chronic pulmonary edema: Secondary | ICD-10-CM | POA: Diagnosis not present

## 2021-03-15 DIAGNOSIS — R000 Tachycardia, unspecified: Secondary | ICD-10-CM | POA: Diagnosis not present

## 2021-03-15 DIAGNOSIS — Z87891 Personal history of nicotine dependence: Secondary | ICD-10-CM | POA: Diagnosis not present

## 2021-03-15 DIAGNOSIS — I959 Hypotension, unspecified: Secondary | ICD-10-CM | POA: Diagnosis not present

## 2021-03-15 DIAGNOSIS — Z9889 Other specified postprocedural states: Secondary | ICD-10-CM | POA: Diagnosis not present

## 2021-03-15 DIAGNOSIS — Z7901 Long term (current) use of anticoagulants: Secondary | ICD-10-CM | POA: Diagnosis not present

## 2021-03-15 DIAGNOSIS — I110 Hypertensive heart disease with heart failure: Secondary | ICD-10-CM | POA: Diagnosis not present

## 2021-03-15 DIAGNOSIS — I499 Cardiac arrhythmia, unspecified: Secondary | ICD-10-CM | POA: Diagnosis not present

## 2021-03-15 DIAGNOSIS — Z20822 Contact with and (suspected) exposure to covid-19: Secondary | ICD-10-CM | POA: Diagnosis not present

## 2021-03-15 DIAGNOSIS — I454 Nonspecific intraventricular block: Secondary | ICD-10-CM | POA: Diagnosis not present

## 2021-03-15 DIAGNOSIS — J9690 Respiratory failure, unspecified, unspecified whether with hypoxia or hypercapnia: Secondary | ICD-10-CM | POA: Diagnosis not present

## 2021-03-15 DIAGNOSIS — Z952 Presence of prosthetic heart valve: Secondary | ICD-10-CM | POA: Diagnosis not present

## 2021-03-15 DIAGNOSIS — I480 Paroxysmal atrial fibrillation: Secondary | ICD-10-CM | POA: Diagnosis not present

## 2021-03-15 DIAGNOSIS — I4891 Unspecified atrial fibrillation: Secondary | ICD-10-CM | POA: Diagnosis not present

## 2021-03-15 DIAGNOSIS — I471 Supraventricular tachycardia: Secondary | ICD-10-CM | POA: Diagnosis not present

## 2021-03-15 DIAGNOSIS — I5033 Acute on chronic diastolic (congestive) heart failure: Secondary | ICD-10-CM | POA: Diagnosis not present

## 2021-03-15 DIAGNOSIS — R0602 Shortness of breath: Secondary | ICD-10-CM | POA: Diagnosis not present

## 2021-03-15 DIAGNOSIS — J9601 Acute respiratory failure with hypoxia: Secondary | ICD-10-CM | POA: Diagnosis not present

## 2021-03-15 DIAGNOSIS — I483 Typical atrial flutter: Secondary | ICD-10-CM | POA: Diagnosis not present

## 2021-03-15 DIAGNOSIS — Z9981 Dependence on supplemental oxygen: Secondary | ICD-10-CM | POA: Diagnosis not present

## 2021-03-15 DIAGNOSIS — I4892 Unspecified atrial flutter: Secondary | ICD-10-CM | POA: Diagnosis not present

## 2021-03-15 DIAGNOSIS — E871 Hypo-osmolality and hyponatremia: Secondary | ICD-10-CM | POA: Diagnosis not present

## 2021-03-15 DIAGNOSIS — I5030 Unspecified diastolic (congestive) heart failure: Secondary | ICD-10-CM | POA: Diagnosis not present

## 2021-03-15 DIAGNOSIS — I080 Rheumatic disorders of both mitral and aortic valves: Secondary | ICD-10-CM | POA: Diagnosis not present

## 2021-03-15 DIAGNOSIS — I517 Cardiomegaly: Secondary | ICD-10-CM | POA: Diagnosis not present

## 2021-03-16 ENCOUNTER — Emergency Department (HOSPITAL_BASED_OUTPATIENT_CLINIC_OR_DEPARTMENT_OTHER)
Admission: EM | Admit: 2021-03-16 | Discharge: 2021-03-16 | Disposition: A | Discharge status: Other Acute Inpatient Hospital | Payer: Medicare Other | Attending: Emergency Medicine | Admitting: Emergency Medicine

## 2021-03-16 ENCOUNTER — Encounter (HOSPITAL_BASED_OUTPATIENT_CLINIC_OR_DEPARTMENT_OTHER): Payer: Self-pay | Admitting: Emergency Medicine

## 2021-03-16 ENCOUNTER — Emergency Department (HOSPITAL_BASED_OUTPATIENT_CLINIC_OR_DEPARTMENT_OTHER): Payer: Medicare Other

## 2021-03-16 ENCOUNTER — Other Ambulatory Visit: Payer: Self-pay

## 2021-03-16 DIAGNOSIS — I517 Cardiomegaly: Secondary | ICD-10-CM | POA: Diagnosis not present

## 2021-03-16 DIAGNOSIS — Z20822 Contact with and (suspected) exposure to covid-19: Secondary | ICD-10-CM | POA: Insufficient documentation

## 2021-03-16 DIAGNOSIS — Z87891 Personal history of nicotine dependence: Secondary | ICD-10-CM | POA: Insufficient documentation

## 2021-03-16 DIAGNOSIS — I4892 Unspecified atrial flutter: Secondary | ICD-10-CM | POA: Diagnosis not present

## 2021-03-16 DIAGNOSIS — E871 Hypo-osmolality and hyponatremia: Secondary | ICD-10-CM | POA: Diagnosis not present

## 2021-03-16 DIAGNOSIS — R0602 Shortness of breath: Secondary | ICD-10-CM | POA: Diagnosis not present

## 2021-03-16 DIAGNOSIS — I4891 Unspecified atrial fibrillation: Secondary | ICD-10-CM | POA: Insufficient documentation

## 2021-03-16 DIAGNOSIS — Z7901 Long term (current) use of anticoagulants: Secondary | ICD-10-CM | POA: Insufficient documentation

## 2021-03-16 DIAGNOSIS — J90 Pleural effusion, not elsewhere classified: Secondary | ICD-10-CM | POA: Diagnosis not present

## 2021-03-16 DIAGNOSIS — I509 Heart failure, unspecified: Secondary | ICD-10-CM | POA: Insufficient documentation

## 2021-03-16 HISTORY — DX: Unspecified atrial flutter: I48.92

## 2021-03-16 LAB — BASIC METABOLIC PANEL
Anion gap: 10 (ref 5–15)
BUN: 17 mg/dL (ref 8–23)
CO2: 22 mmol/L (ref 22–32)
Calcium: 8.9 mg/dL (ref 8.9–10.3)
Chloride: 95 mmol/L — ABNORMAL LOW (ref 98–111)
Creatinine, Ser: 1.18 mg/dL (ref 0.61–1.24)
GFR, Estimated: >60 mL/min (ref >60)
Glucose, Bld: 162 mg/dL — ABNORMAL HIGH (ref 70–99)
Potassium: 4.4 mmol/L (ref 3.5–5.1)
Sodium: 127 mmol/L — ABNORMAL LOW (ref 135–145)

## 2021-03-16 LAB — RESP PANEL BY RT-PCR (FLU A&B, COVID) ARPGX2
Influenza A by PCR: NEGATIVE
Influenza B by PCR: NEGATIVE
SARS Coronavirus 2 by RT PCR: NEGATIVE

## 2021-03-16 LAB — PROTIME-INR
INR: 3.4 — ABNORMAL HIGH (ref 0.8–1.2)
Prothrombin Time: 34.1 seconds — ABNORMAL HIGH (ref 11.4–15.2)

## 2021-03-16 LAB — CBC
HCT: 34.4 % — ABNORMAL LOW (ref 39.0–52.0)
Hemoglobin: 11.3 g/dL — ABNORMAL LOW (ref 13.0–17.0)
MCH: 28.8 pg (ref 26.0–34.0)
MCHC: 32.8 g/dL (ref 30.0–36.0)
MCV: 87.8 fL (ref 80.0–100.0)
Platelets: 196 10*3/uL (ref 150–400)
RBC: 3.92 MIL/uL — ABNORMAL LOW (ref 4.22–5.81)
RDW: 14.6 % (ref 11.5–15.5)
WBC: 14.1 10*3/uL — ABNORMAL HIGH (ref 4.0–10.5)
nRBC: 0 % (ref 0.0–0.2)

## 2021-03-16 LAB — MAGNESIUM: Magnesium: 2 mg/dL (ref 1.7–2.4)

## 2021-03-16 LAB — BRAIN NATRIURETIC PEPTIDE: B Natriuretic Peptide: 644.6 pg/mL — ABNORMAL HIGH (ref 0.0–100.0)

## 2021-03-16 MED ORDER — DILTIAZEM LOAD VIA INFUSION
10.0000 mg | Freq: Once | INTRAVENOUS | Status: AC
  Administered 2021-03-16: 10 mg via INTRAVENOUS
  Filled 2021-03-16: qty 10

## 2021-03-16 MED ORDER — FUROSEMIDE 10 MG/ML IJ SOLN
40.0000 mg | Freq: Once | INTRAMUSCULAR | Status: AC
  Administered 2021-03-16: 40 mg via INTRAVENOUS
  Filled 2021-03-16: qty 4

## 2021-03-16 MED ORDER — DILTIAZEM HCL-DEXTROSE 125-5 MG/125ML-% IV SOLN (PREMIX)
5.0000 mg/h | INTRAVENOUS | Status: DC
  Administered 2021-03-16: 5 mg/h via INTRAVENOUS
  Administered 2021-03-16: 10 mg/h via INTRAVENOUS
  Filled 2021-03-16: qty 125

## 2021-03-16 NOTE — ED Triage Notes (Incomplete)
Pt arrives to ED with c/o shortness of breath

## 2021-03-16 NOTE — ED Triage Notes (Signed)
Pt arrives to ED with c/o SOB that started last night. Pt reports he had a cardioversion for AFlutter at [facility] yesterday and started to feel SOB a couple hours afterwards.Pt with HR 160's, O2 90%.

## 2021-03-16 NOTE — ED Notes (Signed)
Pt at 90% with resp in 40's Moved pt to 4L. Pt now at 94% but resp remain high 30's to low 40's

## 2021-03-16 NOTE — ED Provider Notes (Signed)
[facility] EMERGENCY DEPT Provider Note   CSN: [id number] Arrival date & time: 03/16/21  1358     History Chief Complaint  Patient presents with   Shortness of Breath    Alan Willis is a 75 y.o. male.   Shortness of Breath Associated symptoms: no abdominal pain, no chest pain and no rash   Patient with shortness of breath.  Began last night.  Yesterday at [facility] had a cardioversion for atrial fibrillation.  Last night began to feel worse again.  Upon arrival.  Has heart rate of 160.  Mild hypoxia with sats of about 90%.  Feeling a little better over.  A little cough without sputum production.  Some swelling in his legs.  History of hyponatremia also.  Is on Coumadin and was therapeutic yesterday.  Negative COVID test at home 2 days ago.  Past Medical History:  Diagnosis Date   A-fib (HCC)     There are no problems to display for this patient.   Past Surgical History:  Procedure Laterality Date   ATRIAL FIBRILLATION ABLATION     HERNIA REPAIR     VALVE REPLACEMENT         History reviewed. No pertinent family history.  Social History   Tobacco Use   Smoking status: Former   Smokeless tobacco: Never  Substance Use Topics   Alcohol use: Yes    Comment: daily   Drug use: Never    Home Medications Prior to Admission medications   Medication Sig Start Date End Date Taking? Authorizing Provider  warfarin (COUMADIN) 6 MG tablet Take 6 mg by mouth See admin instructions. Sunday,Monday,Wednesday,Friday-6 mg Tuesday,Thursday,Saturday-3 mg   Yes [provider]    Allergies    Amoxicillin-pot clavulanate and Codeine  Review of Systems   Review of Systems  Constitutional:  Positive for appetite change and fatigue.  HENT:  Negative for congestion.   Respiratory:  Positive for shortness of breath.   Cardiovascular:  Positive for palpitations and leg swelling. Negative for chest pain.  Gastrointestinal:  Negative for abdominal  pain.  Genitourinary:  Negative for flank pain.  Musculoskeletal:  Negative for back pain.  Skin:  Negative for rash.  Neurological:  Negative for weakness.  Psychiatric/Behavioral:  Negative for confusion.    Physical Exam Updated Vital Signs BP (!) 117/99   Pulse (!) 156   Temp 97.9 F (36.6 C) (Oral)   Resp (!) 39   Ht 5\' 10"  (1.778 m)   Wt 77.1 kg   SpO2 93%   BMI 24.39 kg/m   Physical Exam Vitals and nursing note reviewed.  HENT:     Head: Normocephalic.  Cardiovascular:     Rate and Rhythm: Tachycardia present.  Pulmonary:     Comments: Mildly harsh breath sounds.  Tachypnea. Chest:     Chest wall: No tenderness.  Abdominal:     Tenderness: There is no abdominal tenderness.  Musculoskeletal:     Cervical back: Neck supple.     Right lower leg: Edema present.     Left lower leg: Edema present.  Skin:    General: Skin is warm.     Capillary Refill: Capillary refill takes less than 2 seconds.  Neurological:     Mental Status: He is alert and oriented to person, place, and time.    ED Results / Procedures / Treatments   Labs (all labs ordered are listed, but only abnormal results are displayed) Labs Reviewed  BASIC METABOLIC PANEL -  Abnormal; Notable for the following components:      Result Value   Sodium 127 (*)    Chloride 95 (*)    Glucose, Bld 162 (*)    All other components within normal limits  CBC - Abnormal; Notable for the following components:   WBC 14.1 (*)    RBC 3.92 (*)    Hemoglobin 11.3 (*)    HCT 34.4 (*)    All other components within normal limits  PROTIME-INR - Abnormal; Notable for the following components:   Prothrombin Time 34.1 (*)    INR 3.4 (*)    All other components within normal limits  RESP PANEL BY RT-PCR (FLU A&B, COVID) ARPGX2  MAGNESIUM  BRAIN NATRIURETIC PEPTIDE    EKG EKG Interpretation  Date/Time:  Thursday March 16 2021 14:10:23 EST Ventricular Rate:  169 PR Interval:    QRS Duration: 130 QT  Interval:  298 QTC Calculation: 500 R Axis:   -7 Text Interpretation: Wide-QRS tachycardia Nonspecific intraventricular conduction delay Baseline wander in lead(s) I II aVR aVL Confirmed by Davonna Belling [phone]) on 03/16/2021 2:12:50 PM  Radiology DG Chest Portable 1 View  Result Date: 03/16/2021 CLINICAL DATA:  Shortness of breath since last night, had cardioversion for atrial flutter yesterday at [facility], onset of shortness of breath a couple hours afterwards, heart rate in 160s EXAM: PORTABLE CHEST 1 VIEW COMPARISON:  Portable exam 1440 hours compared to 04/14/2020 FINDINGS: Enlargement of cardiac silhouette post AVR. Atherosclerotic calcification aorta. BILATERAL pulmonary infiltrates in the perihilar regions question pulmonary edema versus multifocal infection. Small LEFT pleural effusion. No pneumothorax. Bones demineralized. IMPRESSION: Enlargement of cardiac silhouette with BILATERAL perihilar infiltrates particularly in the upper lobes with small associated LEFT pleural effusion, question pulmonary edema versus multifocal pneumonia. Aortic Atherosclerosis (ICD10-I70.0). Electronically Signed   By: Lavonia Dana M.D.   On: 03/16/2021 14:47    Procedures Procedures   Medications Ordered in ED Medications  diltiazem (CARDIZEM) 1 mg/mL load via infusion 10 mg (10 mg Intravenous Bolus from Bag 03/16/21 1549)    And  diltiazem (CARDIZEM) 125 mg in dextrose 5% 125 mL (1 mg/mL) infusion (5 mg/hr Intravenous New Bag/Given 03/16/21 1548)  furosemide (LASIX) injection 40 mg (40 mg Intravenous Given 03/16/21 1539)    ED Course  I have reviewed the triage vital signs and the nursing notes.  Pertinent labs & imaging results that were available during my care of the patient were reviewed by me and considered in my medical decision making (see chart for details).    MDM Rules/Calculators/A&P                           Patient with shortness of breath.  Found to be in atrial flutter with RVR.   History of same and was cardioverted yesterday.  Likely had been in that for a few days.  Not on any rate control medicine.  X-ray showed pulmonary edema versus multifocal pneumonia.  Does have edema on his legs.  He is on anticoagulation is therapeutic.  However if he is in and out of A. fib this much cardioversion again may not help.  IV Lasix given.  Also start on Cardizem drip.  Will discuss with patient's electrophysiologist.  Likely require admission to the hospital.   Discussed with Dr. Wilmon Pali at [facility] from cardiology.  He request transfer to them for further treatment.  Some Lasix has been given here.  Cardizem drip started.  Chest x-ray showed pneumonia versus CHF.  CRITICAL CARE Performed by: Davonna Belling Total critical care time: 30 minutes Critical care time was exclusive of separately billable procedures and treating other patients. Critical care was necessary to treat or prevent imminent or life-threatening deterioration. Critical care was time spent personally by me on the following activities: development of treatment plan with patient and/or surrogate as well as nursing, discussions with consultants, evaluation of patient's response to treatment, examination of patient, obtaining history from patient or surrogate, ordering and performing treatments and interventions, ordering and review of laboratory studies, ordering and review of radiographic studies, pulse oximetry and re-evaluation of patient's condition.  Final Clinical Impression(s) / ED Diagnoses Final diagnoses:  Atrial flutter with rapid ventricular response (HCC)  Hyponatremia  Congestive heart failure, unspecified HF chronicity, unspecified heart failure type [facility])    Rx / DC Orders ED Discharge Orders     None        Davonna Belling, MD 03/16/21 [phone]

## 2021-03-16 NOTE — ED Notes (Signed)
Pt gave pt 2nd bolus of Cardizem 10 mg over two minutes Per verbal Order Dr. Wallace Cullens

## 2021-03-16 NOTE — ED Notes (Signed)
Patient placed on 2 L [place] per O2 sats of 90-91% w/ shob. Patient HR currently 161; MD aware and at bedside when patient was placed on O2.

## 2021-03-16 NOTE — ED Provider Notes (Signed)
  Provider Note MRN:  [id number]  Arrival date & time: 03/16/21    ED Course and Medical Decision Making  Assumed care from Dr Rubin Payor at shift change.  See not from prior team for complete details, in brief: Pt with atrial flutter, recent cardioversion. To ED with elevated hr, dib. Prior EDP spoke with cardiology at [facility] who recommended transfer. Started on cardiazem and lasix.  Plan per prior physician plan transfer to [facility] for continuity and escalation of care.   Upon EMS arrival patient does have persistently elevated heart rate 150s, 160s, dyspnea.  Bolus of Cardizem ordered, improvement of heart rate.  Improvement to work of breathing.  Blood pressure remained stable. Continue cardiazem infusion, advised EMS to down-titrate if needed if pressure reduces.   .Critical Care Performed by: Sloan Leiter, DO Authorized by: Sloan Leiter, DO   Critical care provider statement:    Critical care time (minutes):  30   Critical care time was exclusive of:  Separately billable procedures and treating other patients   Critical care was necessary to treat or prevent imminent or life-threatening deterioration of the following conditions:  Cardiac failure   Critical care was time spent personally by me on the following activities:  Development of treatment plan with patient or surrogate, discussions with consultants, evaluation of patient's response to treatment, examination of patient, ordering and review of laboratory studies, ordering and review of radiographic studies, ordering and performing treatments and interventions, pulse oximetry, re-evaluation of patient's condition and review of old charts  Final Clinical Impressions(s) / ED Diagnoses     ICD-10-CM   1. Atrial flutter with rapid ventricular response (HCC)  I48.92     2. Hyponatremia  E87.1     3. Congestive heart failure, unspecified HF chronicity, unspecified heart failure type [facility])  I50.9       ED Discharge Orders     None        Discharge Instructions   None         Sloan Leiter, DO 03/16/21 1654

## 2021-03-16 NOTE — ED Notes (Signed)
Lung sounds clear. S1 S2 heard, irregular rapid HR

## 2021-03-29 DIAGNOSIS — I4892 Unspecified atrial flutter: Secondary | ICD-10-CM | POA: Diagnosis not present

## 2021-04-07 DIAGNOSIS — Z7901 Long term (current) use of anticoagulants: Secondary | ICD-10-CM | POA: Diagnosis not present

## 2021-04-24 DIAGNOSIS — D6489 Other specified anemias: Secondary | ICD-10-CM | POA: Diagnosis not present

## 2021-04-24 DIAGNOSIS — I4892 Unspecified atrial flutter: Secondary | ICD-10-CM | POA: Diagnosis not present

## 2021-04-24 DIAGNOSIS — E871 Hypo-osmolality and hyponatremia: Secondary | ICD-10-CM | POA: Diagnosis not present

## 2021-04-24 DIAGNOSIS — Z952 Presence of prosthetic heart valve: Secondary | ICD-10-CM | POA: Diagnosis not present

## 2021-05-08 DIAGNOSIS — I4892 Unspecified atrial flutter: Secondary | ICD-10-CM | POA: Diagnosis not present

## 2021-05-08 DIAGNOSIS — Z9889 Other specified postprocedural states: Secondary | ICD-10-CM | POA: Diagnosis not present

## 2021-05-08 DIAGNOSIS — Z79899 Other long term (current) drug therapy: Secondary | ICD-10-CM | POA: Diagnosis not present

## 2021-05-10 DIAGNOSIS — I498 Other specified cardiac arrhythmias: Secondary | ICD-10-CM | POA: Diagnosis not present

## 2021-05-10 DIAGNOSIS — I440 Atrioventricular block, first degree: Secondary | ICD-10-CM | POA: Diagnosis not present

## 2021-05-10 DIAGNOSIS — I454 Nonspecific intraventricular block: Secondary | ICD-10-CM | POA: Diagnosis not present

## 2021-05-10 DIAGNOSIS — I4892 Unspecified atrial flutter: Secondary | ICD-10-CM | POA: Diagnosis not present

## 2021-05-16 DIAGNOSIS — I4892 Unspecified atrial flutter: Secondary | ICD-10-CM | POA: Diagnosis not present

## 2021-05-16 DIAGNOSIS — I483 Typical atrial flutter: Secondary | ICD-10-CM | POA: Diagnosis not present

## 2021-05-22 DIAGNOSIS — Z7901 Long term (current) use of anticoagulants: Secondary | ICD-10-CM | POA: Diagnosis not present

## 2021-05-29 DIAGNOSIS — Z7901 Long term (current) use of anticoagulants: Secondary | ICD-10-CM | POA: Diagnosis not present

## 2021-06-05 DIAGNOSIS — Z7901 Long term (current) use of anticoagulants: Secondary | ICD-10-CM | POA: Diagnosis not present

## 2021-06-12 DIAGNOSIS — Z7901 Long term (current) use of anticoagulants: Secondary | ICD-10-CM | POA: Diagnosis not present

## 2021-06-13 DIAGNOSIS — I340 Nonrheumatic mitral (valve) insufficiency: Secondary | ICD-10-CM | POA: Diagnosis not present

## 2021-06-13 DIAGNOSIS — I4892 Unspecified atrial flutter: Secondary | ICD-10-CM | POA: Diagnosis not present

## 2021-06-13 DIAGNOSIS — Z952 Presence of prosthetic heart valve: Secondary | ICD-10-CM | POA: Diagnosis not present

## 2021-06-13 DIAGNOSIS — I272 Pulmonary hypertension, unspecified: Secondary | ICD-10-CM | POA: Diagnosis not present

## 2021-06-13 DIAGNOSIS — I081 Rheumatic disorders of both mitral and tricuspid valves: Secondary | ICD-10-CM | POA: Diagnosis not present

## 2021-06-26 DIAGNOSIS — Z7901 Long term (current) use of anticoagulants: Secondary | ICD-10-CM | POA: Diagnosis not present

## 2021-07-04 DIAGNOSIS — I4892 Unspecified atrial flutter: Secondary | ICD-10-CM | POA: Diagnosis not present

## 2021-07-04 DIAGNOSIS — I493 Ventricular premature depolarization: Secondary | ICD-10-CM | POA: Diagnosis not present

## 2021-07-04 DIAGNOSIS — Z952 Presence of prosthetic heart valve: Secondary | ICD-10-CM | POA: Diagnosis not present

## 2021-07-04 DIAGNOSIS — I4891 Unspecified atrial fibrillation: Secondary | ICD-10-CM | POA: Diagnosis not present

## 2021-07-04 DIAGNOSIS — I050 Rheumatic mitral stenosis: Secondary | ICD-10-CM | POA: Diagnosis not present

## 2021-07-04 DIAGNOSIS — Z79899 Other long term (current) drug therapy: Secondary | ICD-10-CM | POA: Diagnosis not present

## 2021-07-10 DIAGNOSIS — Z7901 Long term (current) use of anticoagulants: Secondary | ICD-10-CM | POA: Diagnosis not present

## 2021-07-24 DIAGNOSIS — Z7901 Long term (current) use of anticoagulants: Secondary | ICD-10-CM | POA: Diagnosis not present

## 2021-07-31 DIAGNOSIS — I499 Cardiac arrhythmia, unspecified: Secondary | ICD-10-CM | POA: Diagnosis not present

## 2021-07-31 DIAGNOSIS — Z79899 Other long term (current) drug therapy: Secondary | ICD-10-CM | POA: Diagnosis not present

## 2021-07-31 DIAGNOSIS — I4892 Unspecified atrial flutter: Secondary | ICD-10-CM | POA: Diagnosis not present

## 2021-07-31 DIAGNOSIS — I454 Nonspecific intraventricular block: Secondary | ICD-10-CM | POA: Diagnosis not present

## 2021-07-31 DIAGNOSIS — R9439 Abnormal result of other cardiovascular function study: Secondary | ICD-10-CM | POA: Diagnosis not present

## 2021-08-07 DIAGNOSIS — Z7901 Long term (current) use of anticoagulants: Secondary | ICD-10-CM | POA: Diagnosis not present

## 2021-08-24 DIAGNOSIS — N1831 Chronic kidney disease, stage 3a: Secondary | ICD-10-CM | POA: Diagnosis not present

## 2021-08-24 DIAGNOSIS — D649 Anemia, unspecified: Secondary | ICD-10-CM | POA: Diagnosis not present

## 2021-08-24 DIAGNOSIS — Z7901 Long term (current) use of anticoagulants: Secondary | ICD-10-CM | POA: Diagnosis not present

## 2021-08-24 DIAGNOSIS — Z0000 Encounter for general adult medical examination without abnormal findings: Secondary | ICD-10-CM | POA: Diagnosis not present

## 2021-08-25 DIAGNOSIS — Z1211 Encounter for screening for malignant neoplasm of colon: Secondary | ICD-10-CM | POA: Diagnosis not present

## 2021-08-27 DIAGNOSIS — I4891 Unspecified atrial fibrillation: Secondary | ICD-10-CM | POA: Diagnosis not present

## 2021-08-27 DIAGNOSIS — R0902 Hypoxemia: Secondary | ICD-10-CM | POA: Diagnosis not present

## 2021-08-27 DIAGNOSIS — R609 Edema, unspecified: Secondary | ICD-10-CM | POA: Diagnosis not present

## 2021-08-27 DIAGNOSIS — R55 Syncope and collapse: Secondary | ICD-10-CM | POA: Diagnosis not present

## 2021-08-28 DIAGNOSIS — R790 Abnormal level of blood mineral: Secondary | ICD-10-CM | POA: Diagnosis not present

## 2021-08-28 DIAGNOSIS — I454 Nonspecific intraventricular block: Secondary | ICD-10-CM | POA: Diagnosis not present

## 2021-08-28 DIAGNOSIS — I340 Nonrheumatic mitral (valve) insufficiency: Secondary | ICD-10-CM | POA: Diagnosis not present

## 2021-08-28 DIAGNOSIS — R0602 Shortness of breath: Secondary | ICD-10-CM | POA: Diagnosis not present

## 2021-08-28 DIAGNOSIS — I483 Typical atrial flutter: Secondary | ICD-10-CM | POA: Diagnosis not present

## 2021-08-28 DIAGNOSIS — I4892 Unspecified atrial flutter: Secondary | ICD-10-CM | POA: Diagnosis not present

## 2021-08-28 DIAGNOSIS — Z952 Presence of prosthetic heart valve: Secondary | ICD-10-CM | POA: Diagnosis not present

## 2021-08-31 DIAGNOSIS — I340 Nonrheumatic mitral (valve) insufficiency: Secondary | ICD-10-CM | POA: Diagnosis not present

## 2021-08-31 DIAGNOSIS — R0602 Shortness of breath: Secondary | ICD-10-CM | POA: Diagnosis not present

## 2021-09-01 DIAGNOSIS — I482 Chronic atrial fibrillation, unspecified: Secondary | ICD-10-CM | POA: Diagnosis not present

## 2021-09-01 DIAGNOSIS — Z952 Presence of prosthetic heart valve: Secondary | ICD-10-CM | POA: Diagnosis not present

## 2021-09-01 DIAGNOSIS — I081 Rheumatic disorders of both mitral and tricuspid valves: Secondary | ICD-10-CM | POA: Diagnosis not present

## 2021-09-01 DIAGNOSIS — Z87891 Personal history of nicotine dependence: Secondary | ICD-10-CM | POA: Diagnosis not present

## 2021-09-01 DIAGNOSIS — E871 Hypo-osmolality and hyponatremia: Secondary | ICD-10-CM | POA: Diagnosis not present

## 2021-09-01 DIAGNOSIS — I4892 Unspecified atrial flutter: Secondary | ICD-10-CM | POA: Diagnosis not present

## 2021-09-01 DIAGNOSIS — I5030 Unspecified diastolic (congestive) heart failure: Secondary | ICD-10-CM | POA: Diagnosis not present

## 2021-09-01 DIAGNOSIS — I454 Nonspecific intraventricular block: Secondary | ICD-10-CM | POA: Diagnosis not present

## 2021-09-01 DIAGNOSIS — I509 Heart failure, unspecified: Secondary | ICD-10-CM | POA: Diagnosis not present

## 2021-09-01 DIAGNOSIS — I350 Nonrheumatic aortic (valve) stenosis: Secondary | ICD-10-CM | POA: Diagnosis not present

## 2021-09-01 DIAGNOSIS — R0902 Hypoxemia: Secondary | ICD-10-CM | POA: Diagnosis not present

## 2021-09-01 DIAGNOSIS — I272 Pulmonary hypertension, unspecified: Secondary | ICD-10-CM | POA: Diagnosis not present

## 2021-09-01 DIAGNOSIS — D509 Iron deficiency anemia, unspecified: Secondary | ICD-10-CM | POA: Diagnosis not present

## 2021-09-01 DIAGNOSIS — R791 Abnormal coagulation profile: Secondary | ICD-10-CM | POA: Diagnosis not present

## 2021-09-01 DIAGNOSIS — I5033 Acute on chronic diastolic (congestive) heart failure: Secondary | ICD-10-CM | POA: Diagnosis not present

## 2021-09-01 DIAGNOSIS — J90 Pleural effusion, not elsewhere classified: Secondary | ICD-10-CM | POA: Diagnosis not present

## 2021-09-01 DIAGNOSIS — I342 Nonrheumatic mitral (valve) stenosis: Secondary | ICD-10-CM | POA: Diagnosis not present

## 2021-09-01 DIAGNOSIS — Z7901 Long term (current) use of anticoagulants: Secondary | ICD-10-CM | POA: Diagnosis not present

## 2021-09-01 DIAGNOSIS — I052 Rheumatic mitral stenosis with insufficiency: Secondary | ICD-10-CM | POA: Diagnosis not present

## 2021-09-01 DIAGNOSIS — E876 Hypokalemia: Secondary | ICD-10-CM | POA: Diagnosis not present

## 2021-09-01 DIAGNOSIS — J9601 Acute respiratory failure with hypoxia: Secondary | ICD-10-CM | POA: Diagnosis not present

## 2021-09-06 DIAGNOSIS — Z7901 Long term (current) use of anticoagulants: Secondary | ICD-10-CM | POA: Diagnosis not present

## 2021-09-12 DIAGNOSIS — I4430 Unspecified atrioventricular block: Secondary | ICD-10-CM | POA: Diagnosis not present

## 2021-09-12 DIAGNOSIS — Z952 Presence of prosthetic heart valve: Secondary | ICD-10-CM | POA: Diagnosis not present

## 2021-09-12 DIAGNOSIS — I484 Atypical atrial flutter: Secondary | ICD-10-CM | POA: Diagnosis not present

## 2021-09-12 DIAGNOSIS — Z8639 Personal history of other endocrine, nutritional and metabolic disease: Secondary | ICD-10-CM | POA: Diagnosis not present

## 2021-09-12 DIAGNOSIS — Z79899 Other long term (current) drug therapy: Secondary | ICD-10-CM | POA: Diagnosis not present

## 2021-09-12 DIAGNOSIS — Z7901 Long term (current) use of anticoagulants: Secondary | ICD-10-CM | POA: Diagnosis not present

## 2021-09-12 DIAGNOSIS — I454 Nonspecific intraventricular block: Secondary | ICD-10-CM | POA: Diagnosis not present

## 2021-09-12 DIAGNOSIS — I340 Nonrheumatic mitral (valve) insufficiency: Secondary | ICD-10-CM | POA: Diagnosis not present

## 2021-09-12 DIAGNOSIS — I052 Rheumatic mitral stenosis with insufficiency: Secondary | ICD-10-CM | POA: Diagnosis not present

## 2021-09-12 DIAGNOSIS — I4892 Unspecified atrial flutter: Secondary | ICD-10-CM | POA: Diagnosis not present

## 2021-09-17 DIAGNOSIS — I4892 Unspecified atrial flutter: Secondary | ICD-10-CM | POA: Diagnosis not present

## 2021-09-17 DIAGNOSIS — I454 Nonspecific intraventricular block: Secondary | ICD-10-CM | POA: Diagnosis not present

## 2021-09-17 DIAGNOSIS — I340 Nonrheumatic mitral (valve) insufficiency: Secondary | ICD-10-CM | POA: Diagnosis not present

## 2021-09-18 DIAGNOSIS — Z7901 Long term (current) use of anticoagulants: Secondary | ICD-10-CM | POA: Diagnosis not present

## 2021-09-27 DIAGNOSIS — Z7901 Long term (current) use of anticoagulants: Secondary | ICD-10-CM | POA: Diagnosis not present

## 2021-09-30 DIAGNOSIS — I342 Nonrheumatic mitral (valve) stenosis: Secondary | ICD-10-CM | POA: Diagnosis not present

## 2021-09-30 DIAGNOSIS — Z79899 Other long term (current) drug therapy: Secondary | ICD-10-CM | POA: Diagnosis not present

## 2021-09-30 DIAGNOSIS — I340 Nonrheumatic mitral (valve) insufficiency: Secondary | ICD-10-CM | POA: Diagnosis not present

## 2021-09-30 DIAGNOSIS — I444 Left anterior fascicular block: Secondary | ICD-10-CM | POA: Diagnosis not present

## 2021-09-30 DIAGNOSIS — I440 Atrioventricular block, first degree: Secondary | ICD-10-CM | POA: Diagnosis not present

## 2021-09-30 DIAGNOSIS — Z952 Presence of prosthetic heart valve: Secondary | ICD-10-CM | POA: Diagnosis not present

## 2021-09-30 DIAGNOSIS — Z87891 Personal history of nicotine dependence: Secondary | ICD-10-CM | POA: Diagnosis not present

## 2021-09-30 DIAGNOSIS — I4891 Unspecified atrial fibrillation: Secondary | ICD-10-CM | POA: Diagnosis not present

## 2021-09-30 DIAGNOSIS — D6869 Other thrombophilia: Secondary | ICD-10-CM | POA: Diagnosis not present

## 2021-09-30 DIAGNOSIS — I4892 Unspecified atrial flutter: Secondary | ICD-10-CM | POA: Diagnosis not present

## 2021-09-30 DIAGNOSIS — Z5181 Encounter for therapeutic drug level monitoring: Secondary | ICD-10-CM | POA: Diagnosis not present

## 2021-09-30 DIAGNOSIS — I499 Cardiac arrhythmia, unspecified: Secondary | ICD-10-CM | POA: Diagnosis not present

## 2021-09-30 DIAGNOSIS — Z7901 Long term (current) use of anticoagulants: Secondary | ICD-10-CM | POA: Diagnosis not present

## 2021-10-03 DIAGNOSIS — I444 Left anterior fascicular block: Secondary | ICD-10-CM | POA: Diagnosis not present

## 2021-10-03 DIAGNOSIS — I4891 Unspecified atrial fibrillation: Secondary | ICD-10-CM | POA: Diagnosis not present

## 2021-10-03 DIAGNOSIS — I454 Nonspecific intraventricular block: Secondary | ICD-10-CM | POA: Diagnosis not present

## 2021-10-03 DIAGNOSIS — I4892 Unspecified atrial flutter: Secondary | ICD-10-CM | POA: Diagnosis not present

## 2021-10-12 DIAGNOSIS — I700 Atherosclerosis of aorta: Secondary | ICD-10-CM | POA: Diagnosis not present

## 2021-10-12 DIAGNOSIS — H6123 Impacted cerumen, bilateral: Secondary | ICD-10-CM | POA: Diagnosis not present

## 2021-10-12 DIAGNOSIS — Z87891 Personal history of nicotine dependence: Secondary | ICD-10-CM | POA: Diagnosis not present

## 2021-10-12 DIAGNOSIS — Z23 Encounter for immunization: Secondary | ICD-10-CM | POA: Diagnosis not present

## 2021-10-12 DIAGNOSIS — H903 Sensorineural hearing loss, bilateral: Secondary | ICD-10-CM | POA: Diagnosis not present

## 2021-10-12 DIAGNOSIS — Z952 Presence of prosthetic heart valve: Secondary | ICD-10-CM | POA: Diagnosis not present

## 2021-10-12 DIAGNOSIS — Z7901 Long term (current) use of anticoagulants: Secondary | ICD-10-CM | POA: Diagnosis not present

## 2021-10-12 DIAGNOSIS — I4892 Unspecified atrial flutter: Secondary | ICD-10-CM | POA: Diagnosis not present

## 2021-10-26 DIAGNOSIS — Z7901 Long term (current) use of anticoagulants: Secondary | ICD-10-CM | POA: Diagnosis not present

## 2021-10-30 DIAGNOSIS — I4430 Unspecified atrioventricular block: Secondary | ICD-10-CM | POA: Diagnosis not present

## 2021-10-30 DIAGNOSIS — I499 Cardiac arrhythmia, unspecified: Secondary | ICD-10-CM | POA: Diagnosis not present

## 2021-10-30 DIAGNOSIS — R001 Bradycardia, unspecified: Secondary | ICD-10-CM | POA: Diagnosis not present

## 2021-10-30 DIAGNOSIS — Z7901 Long term (current) use of anticoagulants: Secondary | ICD-10-CM | POA: Diagnosis not present

## 2021-10-30 DIAGNOSIS — I471 Supraventricular tachycardia: Secondary | ICD-10-CM | POA: Diagnosis not present

## 2021-10-30 DIAGNOSIS — Z79899 Other long term (current) drug therapy: Secondary | ICD-10-CM | POA: Diagnosis not present

## 2021-10-30 DIAGNOSIS — Z5181 Encounter for therapeutic drug level monitoring: Secondary | ICD-10-CM | POA: Diagnosis not present

## 2021-10-30 DIAGNOSIS — I4892 Unspecified atrial flutter: Secondary | ICD-10-CM | POA: Diagnosis not present

## 2021-10-30 DIAGNOSIS — Z952 Presence of prosthetic heart valve: Secondary | ICD-10-CM | POA: Diagnosis not present

## 2021-10-30 DIAGNOSIS — I454 Nonspecific intraventricular block: Secondary | ICD-10-CM | POA: Diagnosis not present

## 2021-10-31 DIAGNOSIS — Z952 Presence of prosthetic heart valve: Secondary | ICD-10-CM | POA: Diagnosis not present

## 2021-10-31 DIAGNOSIS — I4892 Unspecified atrial flutter: Secondary | ICD-10-CM | POA: Diagnosis not present

## 2021-10-31 DIAGNOSIS — R5383 Other fatigue: Secondary | ICD-10-CM | POA: Diagnosis not present

## 2021-10-31 DIAGNOSIS — Z7901 Long term (current) use of anticoagulants: Secondary | ICD-10-CM | POA: Diagnosis not present

## 2021-10-31 DIAGNOSIS — E871 Hypo-osmolality and hyponatremia: Secondary | ICD-10-CM | POA: Diagnosis not present

## 2021-10-31 DIAGNOSIS — Z79899 Other long term (current) drug therapy: Secondary | ICD-10-CM | POA: Diagnosis not present

## 2021-10-31 DIAGNOSIS — I342 Nonrheumatic mitral (valve) stenosis: Secondary | ICD-10-CM | POA: Diagnosis not present

## 2021-10-31 DIAGNOSIS — R001 Bradycardia, unspecified: Secondary | ICD-10-CM | POA: Diagnosis not present

## 2021-10-31 DIAGNOSIS — I509 Heart failure, unspecified: Secondary | ICD-10-CM | POA: Diagnosis not present

## 2021-10-31 DIAGNOSIS — I340 Nonrheumatic mitral (valve) insufficiency: Secondary | ICD-10-CM | POA: Diagnosis not present

## 2021-10-31 DIAGNOSIS — I052 Rheumatic mitral stenosis with insufficiency: Secondary | ICD-10-CM | POA: Diagnosis not present

## 2021-11-06 ENCOUNTER — Other Ambulatory Visit: Payer: Self-pay

## 2021-11-06 ENCOUNTER — Emergency Department (HOSPITAL_COMMUNITY)
Admission: EM | Admit: 2021-11-06 | Discharge: 2021-11-06 | Disposition: A | Discharge status: Home / Self Care | Payer: Medicare Other | Attending: Emergency Medicine | Admitting: Emergency Medicine

## 2021-11-06 ENCOUNTER — Emergency Department (HOSPITAL_COMMUNITY): Payer: Medicare Other

## 2021-11-06 DIAGNOSIS — R569 Unspecified convulsions: Secondary | ICD-10-CM | POA: Diagnosis not present

## 2021-11-06 DIAGNOSIS — Z7901 Long term (current) use of anticoagulants: Secondary | ICD-10-CM | POA: Insufficient documentation

## 2021-11-06 DIAGNOSIS — R4182 Altered mental status, unspecified: Secondary | ICD-10-CM | POA: Insufficient documentation

## 2021-11-06 DIAGNOSIS — I499 Cardiac arrhythmia, unspecified: Secondary | ICD-10-CM | POA: Diagnosis not present

## 2021-11-06 DIAGNOSIS — R404 Transient alteration of awareness: Secondary | ICD-10-CM | POA: Diagnosis not present

## 2021-11-06 DIAGNOSIS — R000 Tachycardia, unspecified: Secondary | ICD-10-CM | POA: Diagnosis not present

## 2021-11-06 DIAGNOSIS — Z743 Need for continuous supervision: Secondary | ICD-10-CM | POA: Diagnosis not present

## 2021-11-06 DIAGNOSIS — E871 Hypo-osmolality and hyponatremia: Secondary | ICD-10-CM | POA: Diagnosis not present

## 2021-11-06 LAB — PROTIME-INR
INR: 2.8 — ABNORMAL HIGH (ref 0.8–1.2)
Prothrombin Time: 29.3 seconds — ABNORMAL HIGH (ref 11.4–15.2)

## 2021-11-06 LAB — COMPREHENSIVE METABOLIC PANEL
ALT: 14 U/L (ref 0–44)
AST: 23 U/L (ref 15–41)
Albumin: 3.8 g/dL (ref 3.5–5.0)
Alkaline Phosphatase: 56 U/L (ref 38–126)
Anion gap: 8 (ref 5–15)
BUN: 13 mg/dL (ref 8–23)
CO2: 25 mmol/L (ref 22–32)
Calcium: 9.1 mg/dL (ref 8.9–10.3)
Chloride: 104 mmol/L (ref 98–111)
Creatinine, Ser: 1.33 mg/dL — ABNORMAL HIGH (ref 0.61–1.24)
GFR, Estimated: 56 mL/min — ABNORMAL LOW (ref >60)
Glucose, Bld: 104 mg/dL — ABNORMAL HIGH (ref 70–99)
Potassium: 3.7 mmol/L (ref 3.5–5.1)
Sodium: 137 mmol/L (ref 135–145)
Total Bilirubin: 0.7 mg/dL (ref 0.3–1.2)
Total Protein: 7.2 g/dL (ref 6.5–8.1)

## 2021-11-06 LAB — CBC WITH DIFFERENTIAL/PLATELET
Abs Immature Granulocytes: 0.08 10*3/uL — ABNORMAL HIGH (ref 0.00–0.07)
Basophils Absolute: 0 10*3/uL (ref 0.0–0.1)
Basophils Relative: 0 %
Eosinophils Absolute: 0.2 10*3/uL (ref 0.0–0.5)
Eosinophils Relative: 3 %
HCT: 40.7 % (ref 39.0–52.0)
Hemoglobin: 12.8 g/dL — ABNORMAL LOW (ref 13.0–17.0)
Immature Granulocytes: 1 %
Lymphocytes Relative: 14 %
Lymphs Abs: 1.1 10*3/uL (ref 0.7–4.0)
MCH: 25.8 pg — ABNORMAL LOW (ref 26.0–34.0)
MCHC: 31.4 g/dL (ref 30.0–36.0)
MCV: 81.9 fL (ref 80.0–100.0)
Monocytes Absolute: 0.4 10*3/uL (ref 0.1–1.0)
Monocytes Relative: 6 %
Neutro Abs: 5.5 10*3/uL (ref 1.7–7.7)
Neutrophils Relative %: 76 %
Platelets: 122 10*3/uL — ABNORMAL LOW (ref 150–400)
RBC: 4.97 MIL/uL (ref 4.22–5.81)
RDW: 18.4 % — ABNORMAL HIGH (ref 11.5–15.5)
WBC: 7.3 10*3/uL (ref 4.0–10.5)
nRBC: 0 % (ref 0.0–0.2)

## 2021-11-06 MED ORDER — LEVETIRACETAM 500 MG PO TABS: 500.0000 mg | ORAL_TABLET | Freq: Two times a day (BID) | ORAL | 2 refills | Status: DC

## 2021-11-06 MED ORDER — LEVETIRACETAM 500 MG PO TABS
500.0000 mg | ORAL_TABLET | Freq: Once | ORAL | Status: AC
  Administered 2021-11-06: 500 mg via ORAL
  Filled 2021-11-06: qty 1

## 2021-11-06 NOTE — ED Triage Notes (Signed)
Pt is arriving from home via [facility]. His wife called because he was having a seizure. Pt does not have a hx of seizures. Episode lasted 1 minute. Pt did not fall out of bed, however EMS noticed small trauma to tongue.  Pt spO2 was 90 on arrival, nonrebreather was administered and his spO2 has been at 100% since.   Pt is Aox1, GCS currently 14 however the pt is confused. Pt has a hx of Afib and is on Coumadin.   Last VS   HR 111 BP 111/74 CBG 127  SPo2 100  no pain reported

## 2021-11-06 NOTE — ED Notes (Signed)
Patient transported to CT 

## 2021-11-06 NOTE — ED Provider Notes (Signed)
[facility] EMERGENCY DEPARTMENT Provider Note   CSN: [id number] Arrival date & time: 11/06/21  0435     History  Chief Complaint  Patient presents with   Seizures   Altered Mental Status    Alan Willis is a 76 y.o. male.  Patient brought to the emergency department for evaluation of seizure.  Patient was noted to be experiencing generalized tonic-clonic seizure by wife.  Lasted about 1 minute.  After the seizure resolved, patient was confused.  Patient does not have a history of seizures, does have a history of low sodium secondary to diuretic use.       Home Medications Prior to Admission medications   Medication Sig Start Date End Date Taking? Authorizing Provider  levETIRAcetam (KEPPRA) 500 MG tablet Take 1 tablet (500 mg total) by mouth 2 (two) times daily. 11/06/21  Yes Baily Serpe, Canary Brim, MD  warfarin (COUMADIN) 6 MG tablet Take 6 mg by mouth See admin instructions. Sunday,Monday,Wednesday,Friday-6 mg Tuesday,Thursday,Saturday-3 mg    [provider]      Allergies    Amoxicillin-pot clavulanate and Codeine    Review of Systems   Review of Systems  Physical Exam Updated Vital Signs BP 114/75   Pulse 64   Temp 98.5 F (36.9 C)   Resp (!) 23   SpO2 99%  Physical Exam Vitals and nursing note reviewed.  Constitutional:      General: He is not in acute distress.    Appearance: He is well-developed.  HENT:     Head: Normocephalic and atraumatic.     Mouth/Throat:     Mouth: Mucous membranes are moist.     Comments: Tongue abrasion right side Eyes:     General: Vision grossly intact. Gaze aligned appropriately.     Extraocular Movements: Extraocular movements intact.     Conjunctiva/sclera: Conjunctivae normal.  Cardiovascular:     Rate and Rhythm: Normal rate and regular rhythm.     Pulses: Normal pulses.     Heart sounds: Normal heart sounds, S1 normal and S2 normal. No murmur heard.    No friction rub. No gallop.   Pulmonary:     Effort: Pulmonary effort is normal. No respiratory distress.     Breath sounds: Normal breath sounds.  Abdominal:     Palpations: Abdomen is soft.     Tenderness: There is no abdominal tenderness. There is no guarding or rebound.     Hernia: No hernia is present.  Musculoskeletal:        General: No swelling.     Cervical back: Full passive range of motion without pain, normal range of motion and neck supple. No pain with movement, spinous process tenderness or muscular tenderness. Normal range of motion.     Right lower leg: No edema.     Left lower leg: No edema.  Skin:    General: Skin is warm and dry.     Capillary Refill: Capillary refill takes less than 2 seconds.     Findings: No ecchymosis, erythema, lesion or wound.  Neurological:     Mental Status: He is alert.     GCS: GCS eye subscore is 4. GCS verbal subscore is 5. GCS motor subscore is 6.     Cranial Nerves: Cranial nerves 2-12 are intact.     Sensory: Sensation is intact.     Motor: Motor function is intact. No weakness or abnormal muscle tone.     Coordination: Coordination is intact.  Psychiatric:  Mood and Affect: Mood normal.        Speech: Speech normal.        Behavior: Behavior normal.     ED Results / Procedures / Treatments   Labs (all labs ordered are listed, but only abnormal results are displayed) Labs Reviewed  CBC WITH DIFFERENTIAL/PLATELET - Abnormal; Notable for the following components:      Result Value   Hemoglobin 12.8 (*)    MCH 25.8 (*)    RDW 18.4 (*)    Platelets 122 (*)    Abs Immature Granulocytes 0.08 (*)    All other components within normal limits  COMPREHENSIVE METABOLIC PANEL - Abnormal; Notable for the following components:   Glucose, Bld 104 (*)    Creatinine, Ser 1.33 (*)    GFR, Estimated 56 (*)    All other components within normal limits  PROTIME-INR - Abnormal; Notable for the following components:   Prothrombin Time 29.3 (*)    INR 2.8 (*)     All other components within normal limits    EKG EKG Interpretation  Date/Time:  Monday November 06 2021 04:57:00 EDT Ventricular Rate:  106 PR Interval:  144 QRS Duration: 128 QT Interval:  380 QTC Calculation: 505 R Axis:   -53 Text Interpretation: Sinus tachycardia LAE, consider biatrial enlargement Left bundle branch block Confirmed by Gilda Crease [phone]) on 11/06/2021 5:50:23 AM  Radiology CT HEAD WO CONTRAST ( )  Result Date: 11/06/2021 CLINICAL DATA:  Seizure EXAM: CT HEAD WITHOUT CONTRAST TECHNIQUE: Contiguous axial images were obtained from the base of the skull through the vertex without intravenous contrast. RADIATION DOSE REDUCTION: This exam was performed according to the departmental dose-optimization program which includes automated exposure control, adjustment of the mA and/or kV according to patient size and/or use of iterative reconstruction technique. COMPARISON:  04/14/2020 FINDINGS: Brain: No evidence of acute infarction, hemorrhage, hydrocephalus, extra-axial collection or mass lesion/mass effect. Small foci of peripheral calcification along the left cerebral convexity also seen in 2021. These could be vascular or subarachnoid, no associated infarcts. Mild cerebral volume loss. Vascular: No hyperdense vessel or unexpected calcification. Skull: Normal. Negative for fracture or focal lesion. Sinuses/Orbits: No acute finding. IMPRESSION: No acute finding.  No focal cortical abnormality. Electronically Signed   By: Tiburcio Pea M.D.   On: 11/06/2021 05:42    Procedures Procedures    Medications Ordered in ED Medications  levETIRAcetam (KEPPRA) tablet 500 mg (has no administration in time range)    ED Course/ Medical Decision Making/ A&P                           Medical Decision Making Amount and/or Complexity of Data Reviewed Labs: ordered. Radiology: ordered.   Patient presents with what sounds convincingly as a self-limited generalized  tonic-clonic seizure.  He has never had a generalized tonic-clonic seizure, but wife does describe a previous episode where he was staring off and not responding.  Cannot rule out this being a focal seizure followed by a generalized tonic-clonic seizure tonight.  His work-up has been unrevealing.  Discussed briefly with Dr. Derry Lory, recommends initiating Keppra and outpatient follow-up for routine seizure work-up.  Will return for any further episodes.        Final Clinical Impression(s) / ED Diagnoses Final diagnoses:  Seizure (HCC)    Rx / DC Orders ED Discharge Orders          Ordered    levETIRAcetam (KEPPRA) 500  MG tablet  2 times daily        11/06/21 0708              Gilda Crease, MD 11/06/21 [phone]

## 2021-11-07 ENCOUNTER — Ambulatory Visit: Payer: Medicare Other | Admitting: Neurology

## 2021-11-07 ENCOUNTER — Encounter: Payer: Self-pay | Admitting: Neurology

## 2021-11-07 VITALS — BP 111/73 | HR 110 | Ht 70.0 in | Wt 162.2 lb

## 2021-11-07 DIAGNOSIS — R569 Unspecified convulsions: Secondary | ICD-10-CM

## 2021-11-07 NOTE — Progress Notes (Signed)
NEUROLOGY CONSULTATION NOTE  Alan Willis MRN: [id number] DOB: [birth year]  Referring provider: Dr. Jaci Carrel (ER) Primary care provider: Dr. [name]  Reason for consult:  new onset seizure  Dear Dr Blinda Leatherwood:  Thank you for your kind referral of Alan Willis for consultation of the above symptoms. Although his history is well known to you, please allow me to reiterate it for the purpose of our medical record. The patient was accompanied to the clinic by his wife who also provides collateral information. Records and images were personally reviewed where available.   HISTORY OF PRESENT ILLNESS: This is a pleasant 76 year old right-handed man with a history of mechanical aortic valve replacement, atrial fibrillation on Coumadin, presenting for evaluation of new onset seizures. He was in his usual state of health, went to bed feeling fine and woke up to use the bathroom with no issues earlier that night, went back to sleep, then next recollection is coming to in the hospital. His wife reports that at around 4am she heard him make a guttural sound and found him convulsing with his head turned to the left side and arms flexed over his chest. This lasted a couple of minutes followed by sonorous respiration. No focal weakness, he was confused when EMS arrived but started answering questions and was back to baseline in the ER. In the ER, he was found to have a small abrasion on the right side of his tongue. Bloodwork showed normal sodium 137, creatinine slightly elevated 1.33, normal WBC and glucose (104). I personally reviewed head CT without contrast which did not show any acute changes, there was moderate diffuse atrophy. EKG showed sinus tachycardia, LAE, consider biatrial enlargement, LBBB. He was discharged home on Levetiracetam 500mg  BID. He only took one dose yesterday and it made him sleepy. He did not take dose today.    He denies any prior history of convulsions,  however his wife reports an incident last May 2023 on Mother's Day, they were talking when he suddenly stared off and was unresponsive for a few minutes. By the time EMS arrived, he started responding and vital signs were normal. He saw his cardiologist afterwards who noted his sodium level and Hct were low, so he was admitted to the hospital. His wife recalls a different episode in 2017 or 2018 while he was in the car and suddenly just stopped talking, she would see he was looking for his words with extended pauses but was not staring off. He denies any olfactory/gustatory hallucinations, deja vu, rising epigastric sensation, focal numbness/tingling/weakness, myoclonic jerks. Since the seizure, he has noticed pain in his left buttock and hip, with pain on hip flexion. 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 He usually takes Tylenol with good response. No dizziness, diplopia, dysarthria/dysphagia, neck pain, bowel/bladder dysfunction. He usually gets 8 hours of sleep. He drinks 1-2 beers daily but his wife reminds him he had more alcohol than usual the day prior when they had a family gathering. Memory is "so-so." His wife started noticing more confusion and memory changes 4-5 years ago, losing his keys, phone, and wallet more often. He was getting confused with directions, going to a different place from where she told him to go. He manages his own Coumadin, but she took over his other medications 1.5 years ago because he was forgetting them. He continues to manage finances without issues. Mood is pretty  good, no personality changes, paranoia, or hallucinations.   I personally reviewed MRI brain with and without contrast done 12/2020 for "increased thirst, confusion," no acute changes, there was moderate diffuse atrophy.   Epilepsy Risk Factors:  He had a normal birth and early development.  There is no  history of febrile convulsions, CNS infections such as meningitis/encephalitis, significant traumatic brain injury, neurosurgical procedures, or family history of seizures.    PAST MEDICAL HISTORY: Past Medical History:  Diagnosis Date   A-fib (HCC)     PAST SURGICAL HISTORY: Past Surgical History:  Procedure Laterality Date   ATRIAL FIBRILLATION ABLATION     HERNIA REPAIR     VALVE REPLACEMENT      MEDICATIONS: Current Outpatient Medications on File Prior to Visit  Medication Sig Dispense Refill   furosemide (LASIX) 40 MG tablet 1 tablet     dofetilide (TIKOSYN) 250 MCG capsule Take 250 mcg by mouth every 12 (twelve) hours.     levETIRAcetam (KEPPRA) 500 MG tablet Take 1 tablet (500 mg total) by mouth 2 (two) times daily. 60 tablet 2   spironolactone (ALDACTONE) 25 MG tablet Take 12.5 mg by mouth daily.     warfarin (COUMADIN) 6 MG tablet Take 6 mg by mouth See admin instructions. Sunday,Monday,Wednesday,Friday-6 mg Tuesday,Thursday,Saturday-3 mg     No current facility-administered medications on file prior to visit.    ALLERGIES: Allergies  Allergen Reactions   Amoxicillin-Pot Clavulanate Other (See Comments)    Other reaction(s): rash   Codeine     FAMILY HISTORY: No family history on file.  SOCIAL HISTORY: Social History   Socioeconomic History   Marital status: Married    Spouse name: Not on file   Number of children: Not on file   Years of education: Not on file   Highest education level: Not on file  Occupational History   Not on file  Tobacco Use   Smoking status: Former   Smokeless tobacco: Never  Substance and Sexual Activity   Alcohol use: Yes    Comment: daily   Drug use: Never   Sexual activity: Not on file  Other Topics Concern   Not on file  Social History Narrative   Right handed    Social Determinants of Health   Financial Resource Strain: Not on file  Food Insecurity: Not on file  Transportation Needs: Not on file  Physical  Activity: Not on file  Stress: Not on file  Social Connections: Not on file  Intimate Partner Violence: Not on file     PHYSICAL EXAM: Vitals:   11/07/21 1018  BP: 111/73  Pulse: (!) 110  SpO2: 98%   General: No acute distress Head:  Normocephalic/atraumatic Skin/Extremities: No rash, no edema Neurological Exam: Mental status: alert and oriented to person, place, year/day of week. Month is November, season is Spring. No dysarthria or aphasia, Fund of knowledge is appropriate.  Recent and remote memory are impaired.  Attention and concentration are normal.    Able to name objects and repeat phrases. MMSE 23/30    11/07/2021   11:00 AM  MMSE - Mini Mental State Exam  Orientation to time 2  Orientation to Place 4  Registration 3  Attention/ Calculation 5  Recall 0  Language- name 2 objects 2  Language- repeat 1  Language- follow 3 step command 3  Language- read & follow direction 1  Write a sentence 1  Copy design 1  Total score 23    Cranial nerves: CN I:  not tested CN II: pupils equal, round and reactive to light, visual fields intact CN III, IV, VI:  full range of motion, no nystagmus, no ptosis CN V: facial sensation intact CN VII: upper and lower face symmetric CN VIII: hearing intact to conversation Bulk & Tone: normal, no fasciculations. Motor: 5/5 throughout except for pain on left hip flexion, no pronator drift. Sensation: intact to light touch, cold, pin, vibration sense.  No extinction to double simultaneous stimulation.  Romberg test negative Deep Tendon Reflexes: +1 throughout Cerebellar: no incoordination on finger to nose testing Gait: slow and cautious favoring left leg due to pain Tremor: none   IMPRESSION: This is a pleasant 76 year old right-handed man with a history of mechanical aortic valve replacement, atrial fibrillation on Coumadin, presenting for evaluation of new onset seizures. He had a nocturnal convulsion on 11/06/21 with head turn to the  left, suggestive of right hemisphere onset. His wife also reports an episode of staring last May 2023 in the setting of hyponatremia. Sodium level normal with recent seizure. Etiology of seizures unclear, we discussed doing a brain MRI with and without contrast and EEG to further classify his seizures. He has only taken one dose of Levetiracetam, discussed side effects, they are agreeable to trying 500mg  BID and if too drowsy, will call our office to switch to possibly Lacosamide or Briviact. He has had memory changes the past 4-5 years, MMSE today 23/30, continue to monitor. [address] driving laws were discussed with the patient, and he knows to stop driving after a seizure, until 6 months seizure-free. We discussed avoidance of seizure triggers, including missing medication, sleep deprivation, and alcohol. Follow-up in 3 months, call for any changes.    Thank you for allowing me to participate in the care of this patient. Please do not hesitate to call for any questions or concerns.   01-14-1997, M.D.  CC: Dr. Patrcia Dolly, Dr. Blinda Leatherwood

## 2021-11-07 NOTE — Patient Instructions (Addendum)
Good to meet you.  Schedule MRI brain with and without contrast  2. Schedule routine EEG  3. Start the Keppra 500mg  twice a day. Let me know if any issues  4. Follow-up in 3 months, call for any changes   Seizure Precautions: 1. If medication has been prescribed for you to prevent seizures, take it exactly as directed.  Do not stop taking the medicine without talking to your doctor first, even if you have not had a seizure in a long time.   2. Avoid activities in which a seizure would cause danger to yourself or to others.  Don't operate dangerous machinery, swim alone, or climb in high or dangerous places, such as on ladders, roofs, or girders.  Do not drive unless your doctor says you may.  3. If you have any warning that you may have a seizure, lay down in a safe place where you can't hurt yourself.    4.  No driving for 6 months from last seizure, as per [facility].   Please refer to the following link on the Epile[employer] of [employer]'s website for more information: http://www.epilepsyfoundation.org/answerplace/Social/driving/drivingu.cfm   5.  Maintain good sleep hygiene. Start weaning down alcohol intake.  6.  Contact your doctor if you have any problems that may be related to the medicine you are taking.  7.  Call 911 and bring the patient back to the ED if:        A.  The seizure lasts longer than 5 minutes.       B.  The patient doesn't awaken shortly after the seizure  C.  The patient has new problems such as difficulty seeing, speaking or moving  D.  The patient was injured during the seizure  E.  The patient has a temperature over 102 F (39C)  F.  The patient vomited and now is having trouble breathing

## 2021-11-14 DIAGNOSIS — R001 Bradycardia, unspecified: Secondary | ICD-10-CM | POA: Diagnosis not present

## 2021-11-14 DIAGNOSIS — M25562 Pain in left knee: Secondary | ICD-10-CM | POA: Diagnosis not present

## 2021-11-14 DIAGNOSIS — M25552 Pain in left hip: Secondary | ICD-10-CM | POA: Diagnosis not present

## 2021-11-19 ENCOUNTER — Ambulatory Visit
Admission: RE | Admit: 2021-11-19 | Discharge: 2021-11-19 | Disposition: A | Discharge status: Home / Self Care | Payer: Medicare Other | Source: Ambulatory Visit | Attending: Neurology | Admitting: Neurology

## 2021-11-19 DIAGNOSIS — R569 Unspecified convulsions: Secondary | ICD-10-CM | POA: Diagnosis not present

## 2021-11-19 MED ORDER — GADOBENATE DIMEGLUMINE 529 MG/ML IV SOLN
15.0000 mL | Freq: Once | INTRAVENOUS | Status: AC | PRN
  Administered 2021-11-19: 15 mL via INTRAVENOUS

## 2021-11-20 DIAGNOSIS — Z7901 Long term (current) use of anticoagulants: Secondary | ICD-10-CM | POA: Diagnosis not present

## 2021-11-24 DIAGNOSIS — Z7901 Long term (current) use of anticoagulants: Secondary | ICD-10-CM | POA: Diagnosis not present

## 2021-11-28 DIAGNOSIS — I471 Supraventricular tachycardia: Secondary | ICD-10-CM | POA: Diagnosis not present

## 2021-11-28 DIAGNOSIS — I440 Atrioventricular block, first degree: Secondary | ICD-10-CM | POA: Diagnosis not present

## 2021-11-28 DIAGNOSIS — R001 Bradycardia, unspecified: Secondary | ICD-10-CM | POA: Diagnosis not present

## 2021-12-01 DIAGNOSIS — Z7901 Long term (current) use of anticoagulants: Secondary | ICD-10-CM | POA: Diagnosis not present

## 2021-12-13 ENCOUNTER — Telehealth: Payer: Self-pay | Admitting: Neurology

## 2021-12-13 DIAGNOSIS — M25552 Pain in left hip: Secondary | ICD-10-CM | POA: Diagnosis not present

## 2021-12-13 DIAGNOSIS — M25562 Pain in left knee: Secondary | ICD-10-CM | POA: Diagnosis not present

## 2021-12-13 NOTE — Telephone Encounter (Signed)
Called and spoke to patients wife. Patient takes Keppra at 7:30 AM and 7:30 PM and has been getting these episodes of HA and nausea for the past two weeks around 1:00 PM he doesn't have more than one of these episodes a day and usually takes a Tylenol and a cold compress to help with the discomfort. I asked   about fluids and patients wife said after the congestive heart failure that the patietn is limited on his fluids but has been drinking more recently with the heat.

## 2021-12-13 NOTE — Telephone Encounter (Signed)
Pt has been on keppra about a month now. He seems to be having increased HA and nausea. Wants to know if this is due to the new meds.

## 2021-12-15 DIAGNOSIS — Z7901 Long term (current) use of anticoagulants: Secondary | ICD-10-CM | POA: Diagnosis not present

## 2021-12-15 DIAGNOSIS — Z952 Presence of prosthetic heart valve: Secondary | ICD-10-CM | POA: Diagnosis not present

## 2021-12-15 NOTE — Telephone Encounter (Signed)
Pls let him know that increased headaches and nausea could be from a lot of different reasons, and Keppra could be one of them. The only way we can say it is medication is if he switches to a different medication and does not feel the same way. If he would like to do this, I have samples of a different medication that he would switch to, it is called Briviact. Side effects of Briviact may include drowsiness, dizziness. Thanks

## 2021-12-15 NOTE — Telephone Encounter (Signed)
Patient would like to think about it further and get back to me saying that the patient seems to be doing better at this time

## 2021-12-22 DIAGNOSIS — Z7901 Long term (current) use of anticoagulants: Secondary | ICD-10-CM | POA: Diagnosis not present

## 2021-12-22 DIAGNOSIS — Z952 Presence of prosthetic heart valve: Secondary | ICD-10-CM | POA: Diagnosis not present

## 2021-12-25 ENCOUNTER — Ambulatory Visit (HOSPITAL_COMMUNITY)
Admission: RE | Admit: 2021-12-25 | Discharge: 2021-12-25 | Disposition: A | Discharge status: Home / Self Care | Payer: Medicare Other | Source: Ambulatory Visit | Attending: Neurology | Admitting: Neurology

## 2021-12-25 DIAGNOSIS — R569 Unspecified convulsions: Secondary | ICD-10-CM

## 2021-12-25 NOTE — Procedures (Signed)
ELECTROENCEPHALOGRAM REPORT  Date of Study: 12/25/2021  Patient's Name: Alan Willis MRN: [id number] Date of Birth: [birth year]  Referring Provider: Dr. Patrcia Dolly  Clinical History: This is a 76 year old man with new onset seizures. EEG for classification.  Medications: Keppra Lasix Tikosyn Aldactone Coumadin  Technical Summary: A multichannel digital EEG recording measured by the international 10-20 system with electrodes applied with paste and impedances below 5000 ohms performed in our laboratory with EKG monitoring in an awake and asleep patient.  Hyperventilation was not performed. Photic stimulation were performed.  The digital EEG was referentially recorded, reformatted, and digitally filtered in a variety of bipolar and referential montages for optimal display.    Description: The patient is awake and asleep during the recording.  During maximal wakefulness, there is a symmetric, medium voltage 9.5-10 Hz posterior dominant rhythm that attenuates with eye opening.  The record is symmetric.  During drowsiness and sleep, there is an increase in theta slowing of the background.  Vertex waves and symmetric sleep spindles were seen. Photic stimulation did not elicit any abnormalities.  There were no epileptiform discharges or electrographic seizures seen.    EKG lead was unremarkable.  Impression: This awake and asleep EEG is normal.    Clinical Correlation: A normal EEG does not exclude a clinical diagnosis of epilepsy.  If further clinical questions remain, prolonged EEG may be helpful.  Clinical correlation is advised.   Patrcia Dolly, M.D.

## 2021-12-25 NOTE — Progress Notes (Signed)
Outpatient EEG complete - results pending.  

## 2021-12-27 ENCOUNTER — Telehealth: Payer: Self-pay | Admitting: Neurology

## 2021-12-27 NOTE — Telephone Encounter (Signed)
Pt gave verbal consent over the phone for me to to talk to his wife she was given the results of the EEG and she said that the pt stated he still has some nausea and a headache but its not bothering him much when asked if she need to call the Dr he said no he is fine.

## 2021-12-27 NOTE — Telephone Encounter (Signed)
Patient's wife called, no DPR on file to share PHI, for EEG results.   She also said they are getting ready to travel out of state next week and hope to get the results before then.

## 2021-12-27 NOTE — Telephone Encounter (Signed)
Pt called no answer left a voice mail to call the office back  °

## 2021-12-29 ENCOUNTER — Telehealth: Payer: Self-pay | Admitting: Neurology

## 2021-12-29 NOTE — Telephone Encounter (Signed)
Called Pharmacy and Patient has refill.Called Pt and informed her of refill.

## 2021-12-29 NOTE — Telephone Encounter (Signed)
1. Which medications need refilled? (List name and dosage, if known) levetiracetam   2. Which pharmacy/location is medication to be sent to? (include street and city if local pharmacy) [facility] [address] 

## 2022-01-01 DIAGNOSIS — Z952 Presence of prosthetic heart valve: Secondary | ICD-10-CM | POA: Diagnosis not present

## 2022-01-01 DIAGNOSIS — Z7901 Long term (current) use of anticoagulants: Secondary | ICD-10-CM | POA: Diagnosis not present

## 2022-01-15 DIAGNOSIS — Z7901 Long term (current) use of anticoagulants: Secondary | ICD-10-CM | POA: Diagnosis not present

## 2022-01-15 DIAGNOSIS — Z952 Presence of prosthetic heart valve: Secondary | ICD-10-CM | POA: Diagnosis not present

## 2022-01-25 ENCOUNTER — Telehealth: Payer: Self-pay | Admitting: Neurology

## 2022-01-25 MED ORDER — LEVETIRACETAM 500 MG PO TABS: 500.0000 mg | ORAL_TABLET | Freq: Two times a day (BID) | ORAL | 0 refills | Status: DC

## 2022-01-25 NOTE — Telephone Encounter (Signed)
1. Which medications need refilled? (List name and dosage, if known) levetiracetam   2. Which pharmacy/location is medication to be sent to? (include street and city if local pharmacy) [facility] [address]

## 2022-01-25 NOTE — Telephone Encounter (Signed)
Refill sent in for pt. 

## 2022-01-29 DIAGNOSIS — Z7901 Long term (current) use of anticoagulants: Secondary | ICD-10-CM | POA: Diagnosis not present

## 2022-02-08 ENCOUNTER — Encounter: Payer: Self-pay | Admitting: Neurology

## 2022-02-08 ENCOUNTER — Ambulatory Visit: Payer: Medicare Other | Admitting: Neurology

## 2022-02-08 VITALS — BP 123/64 | HR 70 | Ht 70.0 in | Wt 166.0 lb

## 2022-02-08 DIAGNOSIS — R569 Unspecified convulsions: Secondary | ICD-10-CM

## 2022-02-08 DIAGNOSIS — G3184 Mild cognitive impairment, so stated: Secondary | ICD-10-CM

## 2022-02-08 MED ORDER — LEVETIRACETAM 500 MG PO TABS: 500.0000 mg | ORAL_TABLET | Freq: Two times a day (BID) | ORAL | 3 refills | Status: DC

## 2022-02-08 MED ORDER — DONEPEZIL HCL 10 MG PO TABS: ORAL_TABLET | ORAL | 6 refills | Status: DC

## 2022-02-08 NOTE — Progress Notes (Signed)
NEUROLOGY FOLLOW UP OFFICE NOTE  Alan Willis [id number] 03-Apr-1946  HISTORY OF PRESENT ILLNESS: I had the pleasure of seeing Alan Willis in follow-up in the neurology clinic on 02/08/2022.  The patient was last seen 3 months ago for new onset seizures. He is again accompanied by his wife who helps supplement the history today.  Records and images were personally reviewed where available.  I personally reviewed MRI brain with and without contrast done 11/2021 which did not show any acute changes, there was mild diffuse atrophy. EEG in 12/2021 was normal. He is on Levetiracetam 500mg  BID with no further seizures since 11/06/2021. No staring/unresponsive episodes, gaps in time, olfactory/gustatory hallucinations, focal numbness/tingling/weakness, myoclonic jerks. Since starting the LEV, they feel he has had an increase in headaches. He states today that it starts with his migraine aura of bright flashing lights, then he would have a mild headache over the temples. He states it is not frequent, however his wife feels it is every other day. He takes Tylenol which helps. He would then be drowsy after. Headaches tend to occur toward early afternoon after lunch, but sometimes in the morning. No dizziness, vision changes, no falls. We had previously discussed memory concerns, MMSE 23/30 in July 2023. His wife now manages medications otherwise he would forget. She manages finances. He has not been driving since the seizure but was getting confused with directions. No personality changes but he is sometimes more irritable.     History on Initial Assessment 11/07/2021: This is a pleasant 76 year old right-handed man with a history of mechanical aortic valve replacement, atrial fibrillation on Coumadin, presenting for evaluation of new onset seizures. He was in his usual state of health, went to bed feeling fine and woke up to use the bathroom with no issues earlier that night, went back to sleep, then next  recollection is coming to in the hospital. His wife reports that at around 4am she heard him make a guttural sound and found him convulsing with his head turned to the left side and arms flexed over his chest. This lasted a couple of minutes followed by sonorous respiration. No focal weakness, he was confused when EMS arrived but started answering questions and was back to baseline in the ER. In the ER, he was found to have a small abrasion on the right side of his tongue. Bloodwork showed normal sodium 137, creatinine slightly elevated 1.33, normal WBC and glucose (104). I personally reviewed head CT without contrast which did not show any acute changes, there was moderate diffuse atrophy. EKG showed sinus tachycardia, LAE, consider biatrial enlargement, LBBB. He was discharged home on Levetiracetam 500mg  BID. He only took one dose yesterday and it made him sleepy. He did not take dose today.    He denies any prior history of convulsions, however his wife reports an incident last May 2023 on Mother's Day, they were talking when he suddenly stared off and was unresponsive for a few minutes. By the time EMS arrived, he started responding and vital signs were normal. He saw his cardiologist afterwards who noted his sodium level and Hct were low, so he was admitted to the hospital. His wife recalls a different episode in 2017 or 2018 while he was in the car and suddenly just stopped talking, she would see he was looking for his words with extended pauses but was not staring off. He denies any olfactory/gustatory hallucinations, deja vu, rising epigastric sensation, focal numbness/tingling/weakness, myoclonic jerks. Since the  seizure, he has noticed pain in his left buttock and hip, with pain on hip flexion. 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 He usually takes Tylenol with good response. No dizziness,  diplopia, dysarthria/dysphagia, neck pain, bowel/bladder dysfunction. He usually gets 8 hours of sleep. He drinks 1-2 beers daily but his wife reminds him he had more alcohol than usual the day prior when they had a family gathering. Memory is "so-so." His wife started noticing more confusion and memory changes 4-5 years ago, losing his keys, phone, and wallet more often. He was getting confused with directions, going to a different place from where she told him to go. He manages his own Coumadin, but she took over his other medications 1.5 years ago because he was forgetting them. He continues to manage finances without issues. Mood is pretty good, no personality changes, paranoia, or hallucinations.   I personally reviewed MRI brain with and without contrast done 12/2020 for "increased thirst, confusion," no acute changes, there was moderate diffuse atrophy.   Epilepsy Risk Factors:  He had a normal birth and early development.  There is no history of febrile convulsions, CNS infections such as meningitis/encephalitis, significant traumatic brain injury, neurosurgical procedures, or family history of seizures.    PAST MEDICAL HISTORY: Past Medical History:  Diagnosis Date   A-fib [facility])     MEDICATIONS: Current Outpatient Medications on File Prior to Visit  Medication Sig Dispense Refill   dofetilide (TIKOSYN) 250 MCG capsule Take 250 mcg by mouth every 12 (twelve) hours.     furosemide (LASIX) 40 MG tablet 1 tablet     levETIRAcetam (KEPPRA) 500 MG tablet Take 1 tablet (500 mg total) by mouth 2 (two) times daily. 60 tablet 0   spironolactone (ALDACTONE) 25 MG tablet Take 12.5 mg by mouth daily.     warfarin (COUMADIN) 6 MG tablet Take 6 mg by mouth See admin instructions. Sunday,Monday,Wednesday,Friday-6 mg Tuesday,Thursday,Saturday-3 mg     No current facility-administered medications on file prior to visit.    ALLERGIES: Allergies  Allergen Reactions   Amoxicillin-Pot Clavulanate  Other (See Comments)    Other reaction(s): rash   Codeine     FAMILY HISTORY: History reviewed. No pertinent family history.  SOCIAL HISTORY: Social History   Socioeconomic History   Marital status: Married    Spouse name: Not on file   Number of children: Not on file   Years of education: Not on file   Highest education level: Not on file  Occupational History   Not on file  Tobacco Use   Smoking status: Former   Smokeless tobacco: Never  Substance and Sexual Activity   Alcohol use: Yes    Comment: daily   Drug use: Never   Sexual activity: Not on file  Other Topics Concern   Not on file  Social History Narrative   Right handed    Social Determinants of Health   Financial Resource Strain: Not on file  Food Insecurity: Not on file  Transportation Needs: Not on file  Physical Activity: Not on file  Stress: Not on file  Social Connections: Not on file  Intimate Partner Violence: Not on file     PHYSICAL EXAM: Vitals:   02/08/22 1039  BP: (!) 141/102  Pulse: 70  SpO2: 97%   General: No acute distress Head:  Normocephalic/atraumatic Skin/Extremities: No rash, no edema Neurological Exam: alert and oriented to person, place,  month. Year is 2024. No aphasia or dysarthria. Fund of knowledge is appropriate.  Recent and remote memory are intact, 2/3 delayed recall.  Attention and concentration are normal, 5/5 WORLD backwards.   Cranial nerves: Pupils equal, round. Extraocular movements intact with no nystagmus. Visual fields full.  No facial asymmetry.  Motor: Bulk and tone normal, muscle strength 5/5 throughout with no pronator drift.   Finger to nose testing intact.  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 Last seizure was 11/06/2021. He is on Levetiracetam  500mg  BID but appears to be having an increase in headaches. We discussed switching to Lacosamide, he would like to stay on Levetiracetam for now. We agreed to monitor headaches and keep a diary of frequency, intensity, triggers. We had previously discussed memory concerns, MMSE 23/30 in July 2023, they would like to proceed with starting Donepezil, side effects and expectations discussed. Start 10mg  1/2 tablet daily for 2 weeks, then increase to 1 tablet daily. We discussed the importance of control of vascular risk factors, physical exercise, brain stimulation exercises, and MIND diet for overall brain health. He is aware of [place] driving laws to stop driving until 6 months seizure-free. Follow-up in 3-4 months, call for any changes.   Thank you for allowing me to participate in his care.  Please do not hesitate to call for any questions or concerns.    10-12-1993, M.D.   CC: Dr. 

## 2022-02-08 NOTE — Patient Instructions (Addendum)
Good to see you.  Continue Levetiracetam (Keppra) 500mg  twice a day  2. Start Donepezil 10mg : take 1/2 tablet daily for 2 weeks, then increase to 1 tablet daily  3. Start keeping a diary of your headaches, log down what you are feeling and how intense they are on a scale of 1-10, how long they last  4. Follow-up in 3-4 months, call for any changes   Seizure Precautions: 1. If medication has been prescribed for you to prevent seizures, take it exactly as directed.  Do not stop taking the medicine without talking to your doctor first, even if you have not had a seizure in a long time.   2. Avoid activities in which a seizure would cause danger to yourself or to others.  Don't operate dangerous machinery, swim alone, or climb in high or dangerous places, such as on ladders, roofs, or girders.  Do not drive unless your doctor says you may.  3. If you have any warning that you may have a seizure, lay down in a safe place where you can't hurt yourself.    4.  No driving for 6 months from last seizure, as per [facility].   Please refer to the following link on the [place] website for more information: http://[email or web address].org/[email or web address]/driving/drivingu.cfm   5.  Maintain good sleep hygiene. Avoid alcohol  6.  Contact your doctor if you have any problems that may be related to the medicine you are taking.  7.  Call 911 and bring the patient back to the ED if:        A.  The seizure lasts longer than 5 minutes.       B.  The patient doesn't awaken shortly after the seizure  C.  The patient has new problems such as difficulty seeing, speaking or moving  D.  The patient was injured during the seizure  E.  The patient has a temperature over 102 F (39C)  F.  The patient vomited and now is having trouble breathing

## 2022-02-26 DIAGNOSIS — Z7901 Long term (current) use of anticoagulants: Secondary | ICD-10-CM | POA: Diagnosis not present

## 2022-03-12 DIAGNOSIS — Z7901 Long term (current) use of anticoagulants: Secondary | ICD-10-CM | POA: Diagnosis not present

## 2022-04-05 DIAGNOSIS — R1031 Right lower quadrant pain: Secondary | ICD-10-CM | POA: Diagnosis not present

## 2022-04-10 DIAGNOSIS — Z7901 Long term (current) use of anticoagulants: Secondary | ICD-10-CM | POA: Diagnosis not present

## 2022-05-07 DIAGNOSIS — Z7901 Long term (current) use of anticoagulants: Secondary | ICD-10-CM | POA: Diagnosis not present

## 2022-05-08 DIAGNOSIS — Z7901 Long term (current) use of anticoagulants: Secondary | ICD-10-CM | POA: Diagnosis not present

## 2022-05-08 DIAGNOSIS — I050 Rheumatic mitral stenosis: Secondary | ICD-10-CM | POA: Diagnosis not present

## 2022-05-08 DIAGNOSIS — Z9889 Other specified postprocedural states: Secondary | ICD-10-CM | POA: Diagnosis not present

## 2022-05-08 DIAGNOSIS — I051 Rheumatic mitral insufficiency: Secondary | ICD-10-CM | POA: Diagnosis not present

## 2022-05-08 DIAGNOSIS — Z952 Presence of prosthetic heart valve: Secondary | ICD-10-CM | POA: Diagnosis not present

## 2022-05-08 DIAGNOSIS — I4892 Unspecified atrial flutter: Secondary | ICD-10-CM | POA: Diagnosis not present

## 2022-05-08 DIAGNOSIS — R001 Bradycardia, unspecified: Secondary | ICD-10-CM | POA: Diagnosis not present

## 2022-05-08 DIAGNOSIS — Z79899 Other long term (current) drug therapy: Secondary | ICD-10-CM | POA: Diagnosis not present

## 2022-05-08 DIAGNOSIS — I052 Rheumatic mitral stenosis with insufficiency: Secondary | ICD-10-CM | POA: Diagnosis not present

## 2022-05-08 DIAGNOSIS — E871 Hypo-osmolality and hyponatremia: Secondary | ICD-10-CM | POA: Diagnosis not present

## 2022-05-14 DIAGNOSIS — Z5181 Encounter for therapeutic drug level monitoring: Secondary | ICD-10-CM | POA: Diagnosis not present

## 2022-05-14 DIAGNOSIS — R001 Bradycardia, unspecified: Secondary | ICD-10-CM | POA: Diagnosis not present

## 2022-05-14 DIAGNOSIS — Z7901 Long term (current) use of anticoagulants: Secondary | ICD-10-CM | POA: Diagnosis not present

## 2022-05-14 DIAGNOSIS — Z79899 Other long term (current) drug therapy: Secondary | ICD-10-CM | POA: Diagnosis not present

## 2022-05-14 DIAGNOSIS — I4892 Unspecified atrial flutter: Secondary | ICD-10-CM | POA: Diagnosis not present

## 2022-05-19 DIAGNOSIS — I440 Atrioventricular block, first degree: Secondary | ICD-10-CM | POA: Diagnosis not present

## 2022-05-19 DIAGNOSIS — I454 Nonspecific intraventricular block: Secondary | ICD-10-CM | POA: Diagnosis not present

## 2022-05-19 DIAGNOSIS — I499 Cardiac arrhythmia, unspecified: Secondary | ICD-10-CM | POA: Diagnosis not present

## 2022-05-19 DIAGNOSIS — I4892 Unspecified atrial flutter: Secondary | ICD-10-CM | POA: Diagnosis not present

## 2022-05-21 ENCOUNTER — Encounter: Payer: Self-pay | Admitting: Neurology

## 2022-05-21 ENCOUNTER — Ambulatory Visit: Payer: Medicare Other | Admitting: Neurology

## 2022-05-21 VITALS — BP 128/64 | HR 48 | Ht 70.0 in | Wt 172.6 lb

## 2022-05-21 DIAGNOSIS — R569 Unspecified convulsions: Secondary | ICD-10-CM

## 2022-05-21 DIAGNOSIS — Z7901 Long term (current) use of anticoagulants: Secondary | ICD-10-CM | POA: Diagnosis not present

## 2022-05-21 DIAGNOSIS — G3184 Mild cognitive impairment, so stated: Secondary | ICD-10-CM | POA: Diagnosis not present

## 2022-05-21 MED ORDER — LEVETIRACETAM 500 MG PO TABS
500.0000 mg | ORAL_TABLET | Freq: Two times a day (BID) | ORAL | 3 refills | Status: DC
Start: 2022-05-21 — End: 2022-08-23

## 2022-05-21 NOTE — Patient Instructions (Signed)
Good to see you. Continue Levetiracetam (Keppra) 500mg  twice a day. Follow-up in 6 months, call for any changes.   Seizure Precautions: 1. If medication has been prescribed for you to prevent seizures, take it exactly as directed.  Do not stop taking the medicine without talking to your doctor first, even if you have not had a seizure in a long time.   2. Avoid activities in which a seizure would cause danger to yourself or to others.  Don't operate dangerous machinery, swim alone, or climb in high or dangerous places, such as on ladders, roofs, or girders.  Do not drive unless your doctor says you may.  3. If you have any warning that you may have a seizure, lay down in a safe place where you can't hurt yourself.    4.  No driving for 6 months from last seizure, as per [facility].   Please refer to the following link on the [place] website for more information: http://www.epilepsyfoundation.org/answerplace/Social/driving/drivingu.cfm   5.  Maintain good sleep hygiene.  6.  Contact your doctor if you have any problems that may be related to the medicine you are taking.  7.  Call 911 and bring the patient back to the ED if:        A.  The seizure lasts longer than 5 minutes.       B.  The patient doesn't awaken shortly after the seizure  C.  The patient has new problems such as difficulty seeing, speaking or moving  D.  The patient was injured during the seizure  E.  The patient has a temperature over 102 F (39C)  F.  The patient vomited and now is having trouble breathing        FALL PRECAUTIONS: Be cautious when walking. Scan the area for obstacles that may increase the risk of trips and falls. When getting up in the mornings, sit up at the edge of the bed for a few minutes before getting out of bed. Consider elevating the bed at the head end to avoid drop of blood pressure when getting up. Walk always in a well-lit room (use night lights in the  walls). Avoid area rugs or power cords from appliances in the middle of the walkways. Use a walker or a cane if necessary and consider physical therapy for balance exercise. Get your eyesight checked regularly.  FINANCIAL OVERSIGHT: Supervision, especially oversight when making financial decisions or transactions is also recommended as difficulties arise.  HOME SAFETY: Consider the safety of the kitchen when operating appliances like stoves, microwave oven, and blender. Consider having supervision and share cooking responsibilities until no longer able to participate in those. Accidents with firearms and other hazards in the house should be identified and addressed as well.  DRIVING: Regarding driving, in patients with progressive memory problems, driving will be impaired. We advise to have someone else do the driving if trouble finding directions or if minor accidents are reported. Independent driving assessment is available to determine safety of driving.  ABILITY TO BE LEFT ALONE: If patient is unable to contact 911 operator, consider using LifeLine, or when the need is there, arrange for someone to stay with patients. Smoking is a fire hazard, consider supervision or cessation. Risk of wandering should be assessed by caregiver and if detected at any point, supervision and safe proof recommendations should be instituted.  MEDICATION SUPERVISION: Inability to self-administer medication needs to be constantly addressed. Implement a mechanism to ensure  safe administration of the medications.  RECOMMENDATIONS FOR ALL PATIENTS WITH MEMORY PROBLEMS: 1. Continue to exercise (Recommend 30 minutes of walking everyday, or 3 hours every week) 2. Increase social interactions - continue going to [place] and enjoy social gatherings with friends and family 3. Eat healthy, avoid fried foods and eat more fruits and vegetables 4. Maintain adequate blood pressure, blood sugar, and blood cholesterol level. Reducing  the risk of stroke and cardiovascular disease also helps promoting better memory. 5. Avoid stressful situations. Live a simple life and avoid aggravations. Organize your time and prepare for the next day in anticipation. 6. Sleep well, avoid any interruptions of sleep and avoid any distractions in the bedroom that may interfere with adequate sleep quality 7. Avoid sugar, avoid sweets as there is a strong link between excessive sugar intake, diabetes, and cognitive impairment We discussed the Mediterranean diet, which has been shown to help patients reduce the risk of progressive memory disorders and reduces cardiovascular risk. This includes eating fish, eat fruits and green leafy vegetables, nuts like almonds and hazelnuts, walnuts, and also use olive oil. Avoid fast foods and fried foods as much as possible. Avoid sweets and sugar as sugar use has been linked to worsening of memory function.      Mediterranean Diet  Why follow it? Research shows. Those who follow the Mediterranean diet have a reduced risk of heart disease  The diet is associated with a reduced incidence of Parkinson's and Alzheimer's diseases People following the diet may have longer life expectancies and lower rates of chronic diseases  The Dietary Guidelines for Americans recommends the Mediterranean diet as an eating plan to promote health and prevent disease  What Is the Mediterranean Diet?  Healthy eating plan based on typical foods and recipes of Mediterranean-style cooking The diet is primarily a plant based diet; these foods should make up a majority of meals   Starches - Plant based foods should make up a majority of meals - They are an important sources of vitamins, minerals, energy, antioxidants, and fiber - Choose whole grains, foods high in fiber and minimally processed items  - Typical grain sources include wheat, oats, barley, corn, brown rice, bulgar, farro, millet, polenta, couscous  - Various types of beans  include chickpeas, lentils, fava beans, black beans, white beans   Fruits  Veggies - Large quantities of antioxidant rich fruits & veggies; 6 or more servings  - Vegetables can be eaten raw or lightly drizzled with oil and cooked  - Vegetables common to the traditional Mediterranean Diet include: artichokes, arugula, beets, broccoli, brussel sprouts, cabbage, carrots, celery, collard greens, cucumbers, eggplant, kale, leeks, lemons, lettuce, mushrooms, okra, onions, peas, peppers, potatoes, pumpkin, radishes, rutabaga, shallots, spinach, sweet potatoes, turnips, zucchini - Fruits common to the Mediterranean Diet include: apples, apricots, avocados, cherries, clementines, dates, figs, grapefruits, grapes, melons, nectarines, oranges, peaches, pears, pomegranates, strawberries, tangerines  Fats - Replace butter and margarine with healthy oils, such as olive oil, canola oil, and tahini  - Limit nuts to no more than a handful a day  - Nuts include walnuts, almonds, pecans, pistachios, pine nuts  - Limit or avoid candied, honey roasted or heavily salted nuts - Olives are central to the [employer] - can be eaten whole or used in a variety of dishes   Meats Protein - Limiting red meat: no more than a few times a month - When eating red meat: choose lean cuts and keep the portion to the size of  deck of cards - Eggs: approx. 0 to 4 times a week  - Fish and lean poultry: at least 2 a week  - Healthy protein sources include, chicken, [place], lean beef, lamb - Increase intake of seafood such as tuna, salmon, trout, mackerel, shrimp, scallops - Avoid or limit high fat processed meats such as sausage and bacon  Dairy - Include moderate amounts of low fat dairy products  - Focus on healthy dairy such as fat free yogurt, skim milk, low or reduced fat cheese - Limit dairy products higher in fat such as whole or 2% milk, cheese, ice cream  Alcohol - Moderate amounts of red wine is ok  - No more than 5 oz  daily for women (all ages) and men older than age 89  - No more than 10 oz of wine daily for men younger than 49  Other - Limit sweets and other desserts  - Use herbs and spices instead of salt to flavor foods  - Herbs and spices common to the traditional Mediterranean Diet include: basil, bay leaves, chives, cloves, cumin, fennel, garlic, lavender, marjoram, mint, oregano, parsley, pepper, rosemary, sage, savory, sumac, tarragon, thyme   It's not just a diet, it's a lifestyle:  The Mediterranean diet includes lifestyle factors typical of those in the region  Foods, drinks and meals are best eaten with others and savored Daily physical activity is important for overall good health This could be strenuous exercise like running and aerobics This could also be more leisurely activities such as walking, housework, yard-work, or taking the stairs Moderation is the key; a balanced and healthy diet accommodates most foods and drinks Consider portion sizes and frequency of consumption of certain foods   Meal Ideas & Options:  Breakfast:  Whole wheat toast or whole wheat English muffins with peanut butter & hard boiled egg Steel cut oats topped with apples & cinnamon and skim milk  Fresh fruit: banana, strawberries, melon, berries, peaches  Smoothies: strawberries, bananas, greek yogurt, peanut butter Low fat greek yogurt with blueberries and granola  Egg white omelet with spinach and mushrooms Breakfast couscous: whole wheat couscous, apricots, skim milk, cranberries  Sandwiches:  Hummus and grilled vegetables (peppers, zucchini, squash) on whole wheat bread   Grilled chicken on whole wheat pita with lettuce, tomatoes, cucumbers or tzatziki  [place] salad on whole wheat bread: tuna salad made with greek yogurt, olives, red peppers, capers, green onions Garlic rosemary lamb pita: lamb sauted with garlic, rosemary, salt & pepper; add lettuce, cucumber, greek yogurt to pita - flavor with lemon juice  and black pepper  Seafood:  Mediterranean grilled salmon, seasoned with garlic, basil, parsley, lemon juice and black pepper Shrimp, lemon, and spinach whole-grain pasta salad made with low fat greek yogurt  Seared scallops with lemon orzo  Seared tuna steaks seasoned salt, pepper, coriander topped with tomato mixture of olives, tomatoes, olive oil, minced garlic, parsley, green onions and cappers  Meats:  Herbed greek chicken salad with kalamata olives, cucumber, feta  Red bell peppers stuffed with spinach, bulgur, lean ground beef (or lentils) & topped with feta   Kebabs: skewers of chicken, tomatoes, onions, zucchini, squash  [place] burgers: made with red onions, mint, dill, lemon juice, feta cheese topped with roasted red peppers Vegetarian Cucumber salad: cucumbers, artichoke hearts, celery, red onion, feta cheese, tossed in olive oil & lemon juice  Hummus and whole grain pita points with a greek salad (lettuce, tomato, feta, olives, cucumbers, red onion) Lentil soup with  celery, carrots made with vegetable broth, garlic, salt and pepper  Tabouli salad: parsley, bulgur, mint, scallions, cucumbers, tomato, radishes, lemon juice, olive oil, salt and pepper.

## 2022-05-21 NOTE — Progress Notes (Unsigned)
NEUROLOGY FOLLOW UP OFFICE NOTE  Alan Willis [id number] November 07, 1945  HISTORY OF PRESENT ILLNESS: I had the pleasure of seeing Alan Willis in follow-up in the neurology clinic on 05/21/2022.  The patient was last seen 4 months ago for new onset seizure and memory loss. He is again accompanied by his wife who helps supplement the history today.  Records and images were personally reviewed where available.  On his last visit, he reported headaches on Levetiracetam but opted to stay on 500mg  BID, no further seizures since 11/06/2021. No staring/unresponsive episodes, gaps in time, olfactory/gustatory hallucinations, focal numbness/tingling/weakness, myoclonic jerks. 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 His wife reports he complains of a migraine several times a week, he had one this morning. He states it is once a week at most. He takes Tylenol prn. He and his wife were concerned about memory, MMSE 23/30 in 76/2023. He was started on Donepezil 10mg  daily however due to interaction between Aricept and Dofetilide, they opted to stop medication instead of doing an EKG. His wife manages medications and finances. She states he would forget medications if he did it by himself. He has not been driving. He is independent with dressing and bathing. No personality changes, paranoia, or hallucinations.   History on Initial Assessment 11/07/2021: This is a pleasant 77 year old right-handed man with a history of mechanical aortic valve replacement, atrial fibrillation on Coumadin, presenting for evaluation of new onset seizures. He was in his usual state of health, went to bed feeling fine and woke up to use the bathroom with no issues earlier that night, went back to sleep, then next recollection is coming to in the hospital. His wife reports that at around 4am she heard him make a guttural sound and found him convulsing with his head turned to  the left side and arms flexed over his chest. This lasted a couple of minutes followed by sonorous respiration. No focal weakness, he was confused when EMS arrived but started answering questions and was back to baseline in the ER. In the ER, he was found to have a small abrasion on the right side of his tongue. Bloodwork showed normal sodium 137, creatinine slightly elevated 1.33, normal WBC and glucose (104). I personally reviewed head CT without contrast which did not show any acute changes, there was moderate diffuse atrophy. EKG showed sinus tachycardia, LAE, consider biatrial enlargement, LBBB. He was discharged home on Levetiracetam 500mg  BID. He only took one dose yesterday and it made him sleepy. He did not take dose today.    He denies any prior history of convulsions, however his wife reports an incident last May 2023 on Mother's Day, they were talking when he suddenly stared off and was unresponsive for a few minutes. By the time EMS arrived, he started responding and vital signs were normal. He saw his cardiologist afterwards who noted his sodium level and Hct were low, so he was admitted to the hospital. His wife recalls a different episode in 2017 or 2018 while he was in the car and suddenly just stopped talking, she would see he was looking for his words with extended pauses but was not staring off. He denies any olfactory/gustatory hallucinations, deja vu, rising epigastric sensation, focal numbness/tingling/weakness, myoclonic jerks. Since the seizure, he has noticed pain in his left buttock and hip, with pain on hip flexion. 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 He usually takes Tylenol with good response. No dizziness, diplopia, dysarthria/dysphagia, neck pain, bowel/bladder dysfunction. He usually gets 8 hours of sleep. He drinks 1-2 beers daily but his wife reminds him he had more  alcohol than usual the day prior when they had a family gathering. Memory is "so-so." His wife started noticing more confusion and memory changes 4-5 years ago, losing his keys, phone, and wallet more often. He was getting confused with directions, going to a different place from where she told him to go. He manages his own Coumadin, but she took over his other medications 1.5 years ago because he was forgetting them. He continues to manage finances without issues. Mood is pretty good, no personality changes, paranoia, or hallucinations.   I personally reviewed MRI brain with and without contrast done 12/2020 for "increased thirst, confusion," no acute changes, there was moderate diffuse atrophy.   Epilepsy Risk Factors:  He had a normal birth and early development.  There is no history of febrile convulsions, CNS infections such as meningitis/encephalitis, significant traumatic brain injury, neurosurgical procedures, or family history of seizures.  Diagnostic Data: MRI brain with and without contrast done 11/2021 did not show any acute changes, there was mild diffuse atrophy.  EEG in 12/2021 was normal.  PAST MEDICAL HISTORY: Past Medical History:  Diagnosis Date   A-fib [facility])     MEDICATIONS: Current Outpatient Medications on File Prior to Visit  Medication Sig Dispense Refill   dofetilide (TIKOSYN) 250 MCG capsule Take 250 mcg by mouth every 12 (twelve) hours.     donepezil (ARICEPT) 10 MG tablet Take 1/2 tablet daily for 2 weeks, then increase to 1 tablet daily 30 tablet 6   furosemide (LASIX) 40 MG tablet 1 tablet     levETIRAcetam (KEPPRA) 500 MG tablet Take 1 tablet (500 mg total) by mouth 2 (two) times daily. 180 tablet 3   spironolactone (ALDACTONE) 25 MG tablet Take 12.5 mg by mouth daily.     warfarin (COUMADIN) 6 MG tablet Take 6 mg by mouth See admin instructions. Sunday,Monday,Wednesday,Friday-6 mg Tuesday,Thursday,Saturday-3 mg     No current facility-administered medications  on file prior to visit.    ALLERGIES: Allergies  Allergen Reactions   Amoxicillin-Pot Clavulanate Other (See Comments)    Other reaction(s): rash   Codeine     FAMILY HISTORY: No family history on file.  SOCIAL HISTORY: Social History   Socioeconomic History   Marital status: Married    Spouse name: Not on file   Number of children: Not on file   Years of education: Not on file   Highest education level: Not on file  Occupational History   Not on file  Tobacco Use   Smoking status: Former   Smokeless tobacco: Never  Substance and Sexual Activity   Alcohol use: Yes    Comment: daily   Drug use: Never   Sexual activity: Not on file  Other Topics Concern   Not on file  Social History Narrative   Right handed    Social Determinants of Health   Financial Resource Strain: Not on file  Food Insecurity: Not on file  Transportation Needs: Not on file  Physical Activity: Not on file  Stress: Not on file  Social Connections: Not on file  Intimate Partner Violence: Not on file     PHYSICAL EXAM: Vitals:   05/21/22 1117  BP: 128/64  Pulse: (!) 48  SpO2:  100%   General: No acute distress Head:  Normocephalic/atraumatic Skin/Extremities: No rash, no edema Neurological Exam: alert and oriented to person, place, states it is November 2022. No aphasia or dysarthria. Fund of knowledge is appropriate.  Recent and remote memory are impaired, 0/3 delayed recall. Attention and concentration are reduced, 3/5 WORLD backwards. Cranial nerves: Pupils equal, round. Extraocular movements intact. No facial asymmetry.  Motor: moves all extremities symmetrically at least anti-gravity x 4. Gait narrow-based and steady, no ataxia.    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 He has been doing well on Levetiracetam 500mg   BID with no seizures or seizure-like symptoms since 11/06/2021. Headaches at baseline. We discussed cognitive concerns and expectations from medications. MMSE 23/30 in July 2023. They have opted not to start Donepezil due to interaction with cardiac medication (he is also bradycardic). We will re-evaluate on next visit and may consider Memantine. We discussed the importance of control of vascular risk factors, physical and brain stimulation exercises, MIND diet for overall brain health. We discussed driving restrictions with seizures and cognitive impairment, recommended driving evaluation. His wife will drive with him and let us know if they would like to proceed. Follow-up in 6 months, call for any changes.    Thank you for allowing me to participate in his care.  Please do not hesitate to call for any questions or concerns.    Ellouise Newer, M.D.   CC: Dr. Sabra Heck

## 2022-06-18 DIAGNOSIS — Z7901 Long term (current) use of anticoagulants: Secondary | ICD-10-CM | POA: Diagnosis not present

## 2022-07-06 ENCOUNTER — Telehealth: Payer: Self-pay | Admitting: Anesthesiology

## 2022-07-06 NOTE — Telephone Encounter (Signed)
Pt's wife left message with AN stating pt is on Keppra for Hx of seizures has not missed any doses, last one was in July. States she thinks pt had a focal seizure this morning. States he is fine now.

## 2022-07-06 NOTE — Telephone Encounter (Signed)
Pt called spoke with his wife If just that one time, would stay on same medication, call if any change or increase in seizures. He cannot drive until 6 months seizure-free

## 2022-07-06 NOTE — Telephone Encounter (Signed)
They didn't notice any triggers. Pt wife stated that it started with staring, she was standing when it happened, wife took his arm tried to talk to him he was not responding lasted 60-90 seconds, pt wife stated he came out of it slowly and started talking answering questions. He is now fine he ate and is watching tv

## 2022-07-06 NOTE — Telephone Encounter (Signed)
If just that one time, would stay on same medication, call if any change or increase in seizures. He cannot drive until 6 months seizure-free. Thanks

## 2022-07-06 NOTE — Telephone Encounter (Signed)
Pls ask what happened, was it staring/unresponsiveness, any triggers? Thanks

## 2022-07-09 DIAGNOSIS — R42 Dizziness and giddiness: Secondary | ICD-10-CM | POA: Diagnosis not present

## 2022-07-09 DIAGNOSIS — E871 Hypo-osmolality and hyponatremia: Secondary | ICD-10-CM | POA: Diagnosis not present

## 2022-07-16 DIAGNOSIS — I517 Cardiomegaly: Secondary | ICD-10-CM | POA: Diagnosis not present

## 2022-07-16 DIAGNOSIS — I052 Rheumatic mitral stenosis with insufficiency: Secondary | ICD-10-CM | POA: Diagnosis not present

## 2022-07-17 DIAGNOSIS — Z7901 Long term (current) use of anticoagulants: Secondary | ICD-10-CM | POA: Diagnosis not present

## 2022-08-14 DIAGNOSIS — Z7901 Long term (current) use of anticoagulants: Secondary | ICD-10-CM | POA: Diagnosis not present

## 2022-08-23 ENCOUNTER — Telehealth: Payer: Self-pay | Admitting: Neurology

## 2022-08-23 MED ORDER — LEVETIRACETAM 500 MG PO TABS: 500.0000 mg | ORAL_TABLET | Freq: Two times a day (BID) | ORAL | 0 refills | Status: DC

## 2022-08-23 NOTE — Telephone Encounter (Signed)
Patient needs a refill on the Keprra and will run out of the medication while he is out of town. He has enough for the rest of the week. [facility] [address]   Patient has a return appt with Dr Karel Jarvis on 12-19-22

## 2022-08-23 NOTE — Telephone Encounter (Signed)
Refill sent in for pt. 

## 2022-08-28 DIAGNOSIS — Z0000 Encounter for general adult medical examination without abnormal findings: Secondary | ICD-10-CM | POA: Diagnosis not present

## 2022-08-30 DIAGNOSIS — Z7901 Long term (current) use of anticoagulants: Secondary | ICD-10-CM | POA: Diagnosis not present

## 2022-09-07 DIAGNOSIS — I700 Atherosclerosis of aorta: Secondary | ICD-10-CM | POA: Diagnosis not present

## 2022-09-07 DIAGNOSIS — R569 Unspecified convulsions: Secondary | ICD-10-CM | POA: Diagnosis not present

## 2022-09-07 DIAGNOSIS — I4892 Unspecified atrial flutter: Secondary | ICD-10-CM | POA: Diagnosis not present

## 2022-09-25 DIAGNOSIS — I5030 Unspecified diastolic (congestive) heart failure: Secondary | ICD-10-CM | POA: Diagnosis not present

## 2022-09-25 DIAGNOSIS — D6489 Other specified anemias: Secondary | ICD-10-CM | POA: Diagnosis not present

## 2022-09-25 DIAGNOSIS — Z7901 Long term (current) use of anticoagulants: Secondary | ICD-10-CM | POA: Diagnosis not present

## 2022-09-25 DIAGNOSIS — N1831 Chronic kidney disease, stage 3a: Secondary | ICD-10-CM | POA: Diagnosis not present

## 2022-09-25 DIAGNOSIS — E785 Hyperlipidemia, unspecified: Secondary | ICD-10-CM | POA: Diagnosis not present

## 2022-10-12 DIAGNOSIS — Z7901 Long term (current) use of anticoagulants: Secondary | ICD-10-CM | POA: Diagnosis not present

## 2022-10-22 DIAGNOSIS — Z7901 Long term (current) use of anticoagulants: Secondary | ICD-10-CM | POA: Diagnosis not present

## 2022-11-05 DIAGNOSIS — I484 Atypical atrial flutter: Secondary | ICD-10-CM | POA: Diagnosis not present

## 2022-11-05 DIAGNOSIS — I440 Atrioventricular block, first degree: Secondary | ICD-10-CM | POA: Diagnosis not present

## 2022-11-19 DIAGNOSIS — Z7901 Long term (current) use of anticoagulants: Secondary | ICD-10-CM | POA: Diagnosis not present

## 2022-11-20 DIAGNOSIS — Z952 Presence of prosthetic heart valve: Secondary | ICD-10-CM | POA: Diagnosis not present

## 2022-11-20 DIAGNOSIS — I051 Rheumatic mitral insufficiency: Secondary | ICD-10-CM | POA: Diagnosis not present

## 2022-11-20 DIAGNOSIS — I050 Rheumatic mitral stenosis: Secondary | ICD-10-CM | POA: Diagnosis not present

## 2022-11-20 DIAGNOSIS — I484 Atypical atrial flutter: Secondary | ICD-10-CM | POA: Diagnosis not present

## 2022-12-03 DIAGNOSIS — Z7901 Long term (current) use of anticoagulants: Secondary | ICD-10-CM | POA: Diagnosis not present

## 2022-12-05 ENCOUNTER — Telehealth: Payer: Self-pay | Admitting: Neurology

## 2022-12-05 ENCOUNTER — Other Ambulatory Visit: Payer: Self-pay | Admitting: Neurology

## 2022-12-05 MED ORDER — LEVETIRACETAM 500 MG PO TABS: 500.0000 mg | ORAL_TABLET | Freq: Two times a day (BID) | ORAL | 0 refills | Status: DC

## 2022-12-05 NOTE — Telephone Encounter (Signed)
Pts spouse called in stating that pt is needing a refill on Rx levetiracetam (KEPPRA) 50 MG  [facility] [address] [facility]

## 2022-12-05 NOTE — Telephone Encounter (Signed)
Refill sent in for pt. 

## 2022-12-06 NOTE — Telephone Encounter (Signed)
Patient is needing a refill on LevETRIAcetam 500mg  [facility] [place] [phone]

## 2022-12-18 DIAGNOSIS — Z7901 Long term (current) use of anticoagulants: Secondary | ICD-10-CM | POA: Diagnosis not present

## 2022-12-19 ENCOUNTER — Ambulatory Visit: Payer: Medicare Other | Admitting: Neurology

## 2022-12-19 ENCOUNTER — Encounter: Payer: Self-pay | Admitting: Neurology

## 2022-12-19 VITALS — BP 116/59 | HR 51 | Ht 70.0 in | Wt 176.8 lb

## 2022-12-19 DIAGNOSIS — G3184 Mild cognitive impairment, so stated: Secondary | ICD-10-CM

## 2022-12-19 DIAGNOSIS — G40009 Localization-related (focal) (partial) idiopathic epilepsy and epileptic syndromes with seizures of localized onset, not intractable, without status epilepticus: Secondary | ICD-10-CM

## 2022-12-19 MED ORDER — LEVETIRACETAM 500 MG PO TABS: ORAL_TABLET | ORAL | Status: DC

## 2022-12-19 NOTE — Patient Instructions (Signed)
Always a pleasure to see you.  Increase Levetiracetam (Keppra) 500mg : take 1 tablet in AM, 1 and 1/2 tablets in PM  2. Follow-up in 6 months, call for any changes   Seizure Precautions: 1. If medication has been prescribed for you to prevent seizures, take it exactly as directed.  Do not stop taking the medicine without talking to your doctor first, even if you have not had a seizure in a long time.   2. Avoid activities in which a seizure would cause danger to yourself or to others.  Don't operate dangerous machinery, swim alone, or climb in high or dangerous places, such as on ladders, roofs, or girders.  Do not drive unless your doctor says you may.  3. If you have any warning that you may have a seizure, lay down in a safe place where you can't hurt yourself.    4.  No driving for 6 months from last seizure, as per [facility].   Please refer to the following link on the Epilepsy Foundation of America's website for more information: http://www.epilepsyfoundation.org/answerplace/Social/driving/drivingu.cfm   5.  Maintain good sleep hygiene. Avoid alcohol.  6.  Contact your doctor if you have any problems that may be related to the medicine you are taking.  7.  Call 911 and bring the patient back to the ED if:        A.  The seizure lasts longer than 5 minutes.       B.  The patient doesn't awaken shortly after the seizure  C.  The patient has new problems such as difficulty seeing, speaking or moving  D.  The patient was injured during the seizure  E.  The patient has a temperature over 102 F (39C)  F.  The patient vomited and now is having trouble breathing

## 2022-12-19 NOTE — Progress Notes (Signed)
NEUROLOGY FOLLOW UP OFFICE NOTE  Alan Willis [id number] 03-30-1946  HISTORY OF PRESENT ILLNESS: I had the pleasure of seeing Alan Willis in follow-up in the neurology clinic on 12/19/2022.  The patient was last seen 7 months ago for seizures and memory loss. He is again accompanied by his wife who helps supplement the history today.  Records and images were personally reviewed where available. Since his last visit, his wife called our office to report a focal seizure with impaired awareness on 07/06/22. Prior to this, he had been event-free for 8 months. He was staring, unresponsive for 60-90 seconds. No warning symptoms, tongue bite, incontinence, focal weakness. No missed medications, sleep deprivation, or alcohol. He is on Levetiracetam 500mg  BID without side effects. He denies any olfactory/gustatory hallucinations, myoclonic jerks. He has a history of migraines since youth and has migraines every once in a while. His wife reminds him he sometimes says his temples feel like there is congestion. No dizziness, vision changes. Memory has "good and bad days." His wife manages medications and finances. He does very little driving "just to get the mail." He is independent with dressing and bathing. He is usually in a good mood. He walks regularly and does take an afternoon nap regularly, sometimes for 3 hours per wife. She has noticed more anxiety towards the evening hours, asking if he locked the doors, if he needs to walk the dog, if the dishes are in the dishwasher. No paranoia or hallucinations. No falls.    History on Initial Assessment 11/07/2021: This is a pleasant 77 year old right-handed man with a history of mechanical aortic valve replacement, atrial fibrillation on Coumadin, presenting for evaluation of new onset seizures. He was in his usual state of health, went to bed feeling fine and woke up to use the bathroom with no issues earlier that night, went back to sleep, then next  recollection is coming to in the hospital. His wife reports that at around 4am she heard him make a guttural sound and found him convulsing with his head turned to the left side and arms flexed over his chest. This lasted a couple of minutes followed by sonorous respiration. No focal weakness, he was confused when EMS arrived but started answering questions and was back to baseline in the ER. In the ER, he was found to have a small abrasion on the right side of his tongue. Bloodwork showed normal sodium 137, creatinine slightly elevated 1.33, normal WBC and glucose (104). I personally reviewed head CT without contrast which did not show any acute changes, there was moderate diffuse atrophy. EKG showed sinus tachycardia, LAE, consider biatrial enlargement, LBBB. He was discharged home on Levetiracetam 500mg  BID. He only took one dose yesterday and it made him sleepy. He did not take dose today.    He denies any prior history of convulsions, however his wife reports an incident last May 2023 on Mother's Day, they were talking when he suddenly stared off and was unresponsive for a few minutes. By the time EMS arrived, he started responding and vital signs were normal. He saw his cardiologist afterwards who noted his sodium level and Hct were low, so he was admitted to the hospital. His wife recalls a different episode in 2017 or 2018 while he was in the car and suddenly just stopped talking, she would see he was looking for his words with extended pauses but was not staring off. He denies any olfactory/gustatory hallucinations, deja vu, rising epigastric sensation,  focal numbness/tingling/weakness, myoclonic jerks. Since the seizure, he has noticed pain in his left buttock and hip, with pain on hip flexion. 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 He usually takes Tylenol with good response. No dizziness,  diplopia, dysarthria/dysphagia, neck pain, bowel/bladder dysfunction. He usually gets 8 hours of sleep. He drinks 1-2 beers daily but his wife reminds him he had more alcohol than usual the day prior when they had a family gathering. Memory is "so-so." His wife started noticing more confusion and memory changes 4-5 years ago, losing his keys, phone, and wallet more often. He was getting confused with directions, going to a different place from where she told him to go. He manages his own Coumadin, but she took over his other medications 1.5 years ago because he was forgetting them. He continues to manage finances without issues. Mood is pretty good, no personality changes, paranoia, or hallucinations.   I personally reviewed MRI brain with and without contrast done 12/2020 for "increased thirst, confusion," no acute changes, there was moderate diffuse atrophy.   Epilepsy Risk Factors:  He had a normal birth and early development.  There is no history of febrile convulsions, CNS infections such as meningitis/encephalitis, significant traumatic brain injury, neurosurgical procedures, or family history of seizures.  Diagnostic Data: MRI brain with and without contrast done 11/2021 did not show any acute changes, there was mild diffuse atrophy.  EEG in 12/2021 was normal.  PAST MEDICAL HISTORY: Past Medical History:  Diagnosis Date   A-fib [facility])     MEDICATIONS: Current Outpatient Medications on File Prior to Visit  Medication Sig Dispense Refill   dofetilide (TIKOSYN) 250 MCG capsule Take 250 mcg by mouth every 12 (twelve) hours.     doxycycline (VIBRA-TABS) 100 MG tablet 1 tablet Orally 30-60 minutes prior to procedure     furosemide (LASIX) 40 MG tablet 1 tablet     spironolactone (ALDACTONE) 25 MG tablet Take 12.5 mg by mouth daily.     warfarin (COUMADIN) 6 MG tablet Take 6 mg by mouth See admin instructions. Sunday,Monday,Wednesday,Friday-6 mg Tuesday,Thursday,Saturday-3 mg     No current  facility-administered medications on file prior to visit.    ALLERGIES: Allergies  Allergen Reactions   Amoxicillin-Pot Clavulanate Other (See Comments)    Other reaction(s): rash   Codeine     FAMILY HISTORY: History reviewed. No pertinent family history.  SOCIAL HISTORY: Social History   Socioeconomic History   Marital status: Married    Spouse name: Not on file   Number of children: Not on file   Years of education: Not on file   Highest education level: Not on file  Occupational History   Not on file  Tobacco Use   Smoking status: Some Days    Types: Cigarettes, Cigars   Smokeless tobacco: Never  Vaping Use   Vaping status: Never Used  Substance and Sexual Activity   Alcohol use: Not Currently    Comment: daily   Drug use: Never   Sexual activity: Not on file  Other Topics Concern   Not on file  Social History Narrative   Right handed    Lives with family    Retired    Drinks a etoh free beer a day    Social Determinants of Corporate investment banker Strain: Not on file  Food Insecurity: Not on file  Transportation Needs: Not on file  Physical Activity:  Not on file  Stress: Not on file  Social Connections: Not on file  Intimate Partner Violence: Not on file     PHYSICAL EXAM: Vitals:   12/19/22 0755  BP: (!) 116/59  Pulse: (!) 51  SpO2: 97%   General: No acute distress Head:  Normocephalic/atraumatic Skin/Extremities: No rash, no edema Neurological Exam: alert and oriented to person, place, says it is May 2015, Spring (it is September 2024, summer/fall). No aphasia or dysarthria. Fund of knowledge is appropriate.  Recent and remote memory are intact.  Attention and concentration are normal.  MMSE 24/30    12/19/2022    8:00 AM 11/07/2021   11:00 AM  MMSE - Mini Mental State Exam  Orientation to time 0 2  Orientation to Place 4 4  Registration 3 3  Attention/ Calculation 5 5  Recall 3 0  Language- name 2 objects 2 2  Language- repeat 1 1   Language- follow 3 step command 3 3  Language- read & follow direction 1 1  Write a sentence 1 1  Copy design 1 1  Total score 24 23   Cranial nerves: Pupils equal, round. Extraocular movements intact with no nystagmus. Visual fields full.  No facial asymmetry.  Motor: Bulk and tone normal, muscle strength 5/5 throughout with no pronator drift.   Finger to nose testing intact.  Gait narrow-based and steady, no ataxia. No tremors.    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 He had another focal seizure with impaired awareness on 07/06/22. We discussed increasing Levetiracetam to 500mg  in AM, 750mg  in PM, monitor for excessive drowsiness. We again discussed memory, MMSE today stable 24/30. His wife has mainly noticed more anxiety in the evening hours. Continue to monitor, we may consider starting Memantine on next visit (avoiding cholinesterase inhibitors due to bradycardia). We discussed the importance of control of vascular risk factors, physical exercise and brain stimulation exercises, MIND diet for overall brain health. He is aware of [place] driving laws to stop driving until 6 months seizure-free. Follow-up in 6 months, call for any changes.    Thank you for allowing me to participate in his care.  Please do not hesitate to call for any questions or concerns.    Patrcia Dolly, M.D.   CC: Dr. Hyacinth Meeker

## 2022-12-25 DIAGNOSIS — Z7901 Long term (current) use of anticoagulants: Secondary | ICD-10-CM | POA: Diagnosis not present

## 2022-12-31 ENCOUNTER — Telehealth: Payer: Self-pay | Admitting: Neurology

## 2022-12-31 MED ORDER — LEVETIRACETAM 500 MG PO TABS: ORAL_TABLET | ORAL | 1 refills | Status: DC

## 2022-12-31 NOTE — Telephone Encounter (Signed)
Refill has been

## 2022-12-31 NOTE — Telephone Encounter (Signed)
Pt needs a [facility]ill [facility] [address]

## 2022-12-31 NOTE — Telephone Encounter (Signed)
Refill has been sent. Called patients wife and informed her that refill of medication has been sent in.

## 2023-01-08 DIAGNOSIS — Z7901 Long term (current) use of anticoagulants: Secondary | ICD-10-CM | POA: Diagnosis not present

## 2023-01-23 DIAGNOSIS — Z7901 Long term (current) use of anticoagulants: Secondary | ICD-10-CM | POA: Diagnosis not present

## 2023-02-13 DIAGNOSIS — Z7901 Long term (current) use of anticoagulants: Secondary | ICD-10-CM | POA: Diagnosis not present

## 2023-03-03 ENCOUNTER — Emergency Department (HOSPITAL_BASED_OUTPATIENT_CLINIC_OR_DEPARTMENT_OTHER): Payer: Medicare Other

## 2023-03-03 ENCOUNTER — Inpatient Hospital Stay (HOSPITAL_COMMUNITY): Payer: Medicare Other

## 2023-03-03 ENCOUNTER — Encounter (HOSPITAL_BASED_OUTPATIENT_CLINIC_OR_DEPARTMENT_OTHER): Payer: Self-pay | Admitting: Emergency Medicine

## 2023-03-03 ENCOUNTER — Inpatient Hospital Stay (HOSPITAL_BASED_OUTPATIENT_CLINIC_OR_DEPARTMENT_OTHER)
Admission: EM | Admit: 2023-03-03 | Discharge: 2023-03-17 | DRG: 368 | Disposition: A | Discharge status: Home Health | Payer: Medicare Other | Attending: Internal Medicine | Admitting: Internal Medicine

## 2023-03-03 ENCOUNTER — Other Ambulatory Visit: Payer: Self-pay

## 2023-03-03 DIAGNOSIS — K828 Other specified diseases of gallbladder: Secondary | ICD-10-CM | POA: Diagnosis not present

## 2023-03-03 DIAGNOSIS — I700 Atherosclerosis of aorta: Secondary | ICD-10-CM | POA: Insufficient documentation

## 2023-03-03 DIAGNOSIS — R310 Gross hematuria: Secondary | ICD-10-CM | POA: Diagnosis not present

## 2023-03-03 DIAGNOSIS — D62 Acute posthemorrhagic anemia: Secondary | ICD-10-CM | POA: Diagnosis present

## 2023-03-03 DIAGNOSIS — K56609 Unspecified intestinal obstruction, unspecified as to partial versus complete obstruction: Secondary | ICD-10-CM | POA: Diagnosis not present

## 2023-03-03 DIAGNOSIS — F17210 Nicotine dependence, cigarettes, uncomplicated: Secondary | ICD-10-CM | POA: Diagnosis present

## 2023-03-03 DIAGNOSIS — K5731 Diverticulosis of large intestine without perforation or abscess with bleeding: Secondary | ICD-10-CM | POA: Diagnosis present

## 2023-03-03 DIAGNOSIS — R579 Shock, unspecified: Secondary | ICD-10-CM

## 2023-03-03 DIAGNOSIS — J9811 Atelectasis: Secondary | ICD-10-CM | POA: Diagnosis not present

## 2023-03-03 DIAGNOSIS — I5033 Acute on chronic diastolic (congestive) heart failure: Secondary | ICD-10-CM

## 2023-03-03 DIAGNOSIS — I4821 Permanent atrial fibrillation: Secondary | ICD-10-CM | POA: Diagnosis present

## 2023-03-03 DIAGNOSIS — Z952 Presence of prosthetic heart valve: Secondary | ICD-10-CM | POA: Diagnosis not present

## 2023-03-03 DIAGNOSIS — R9341 Abnormal radiologic findings on diagnostic imaging of renal pelvis, ureter, or bladder: Secondary | ICD-10-CM | POA: Diagnosis not present

## 2023-03-03 DIAGNOSIS — T45515A Adverse effect of anticoagulants, initial encounter: Secondary | ICD-10-CM | POA: Diagnosis not present

## 2023-03-03 DIAGNOSIS — I77810 Thoracic aortic ectasia: Secondary | ICD-10-CM | POA: Diagnosis not present

## 2023-03-03 DIAGNOSIS — I5181 Takotsubo syndrome: Secondary | ICD-10-CM | POA: Diagnosis present

## 2023-03-03 DIAGNOSIS — N182 Chronic kidney disease, stage 2 (mild): Secondary | ICD-10-CM | POA: Diagnosis not present

## 2023-03-03 DIAGNOSIS — R578 Other shock: Secondary | ICD-10-CM | POA: Diagnosis not present

## 2023-03-03 DIAGNOSIS — F17290 Nicotine dependence, other tobacco product, uncomplicated: Secondary | ICD-10-CM | POA: Diagnosis not present

## 2023-03-03 DIAGNOSIS — I4891 Unspecified atrial fibrillation: Secondary | ICD-10-CM | POA: Diagnosis not present

## 2023-03-03 DIAGNOSIS — E874 Mixed disorder of acid-base balance: Secondary | ICD-10-CM | POA: Diagnosis present

## 2023-03-03 DIAGNOSIS — I493 Ventricular premature depolarization: Secondary | ICD-10-CM | POA: Diagnosis present

## 2023-03-03 DIAGNOSIS — J90 Pleural effusion, not elsewhere classified: Secondary | ICD-10-CM | POA: Diagnosis not present

## 2023-03-03 DIAGNOSIS — K922 Gastrointestinal hemorrhage, unspecified: Secondary | ICD-10-CM | POA: Diagnosis not present

## 2023-03-03 DIAGNOSIS — K8020 Calculus of gallbladder without cholecystitis without obstruction: Secondary | ICD-10-CM | POA: Diagnosis present

## 2023-03-03 DIAGNOSIS — Q402 Other specified congenital malformations of stomach: Secondary | ICD-10-CM | POA: Diagnosis not present

## 2023-03-03 DIAGNOSIS — J9601 Acute respiratory failure with hypoxia: Secondary | ICD-10-CM | POA: Diagnosis not present

## 2023-03-03 DIAGNOSIS — R7989 Other specified abnormal findings of blood chemistry: Secondary | ICD-10-CM | POA: Diagnosis not present

## 2023-03-03 DIAGNOSIS — D696 Thrombocytopenia, unspecified: Secondary | ICD-10-CM | POA: Diagnosis not present

## 2023-03-03 DIAGNOSIS — K5730 Diverticulosis of large intestine without perforation or abscess without bleeding: Secondary | ICD-10-CM | POA: Diagnosis not present

## 2023-03-03 DIAGNOSIS — K4091 Unilateral inguinal hernia, without obstruction or gangrene, recurrent: Secondary | ICD-10-CM | POA: Diagnosis present

## 2023-03-03 DIAGNOSIS — Z87898 Personal history of other specified conditions: Secondary | ICD-10-CM | POA: Diagnosis not present

## 2023-03-03 DIAGNOSIS — K259 Gastric ulcer, unspecified as acute or chronic, without hemorrhage or perforation: Secondary | ICD-10-CM | POA: Diagnosis not present

## 2023-03-03 DIAGNOSIS — I130 Hypertensive heart and chronic kidney disease with heart failure and stage 1 through stage 4 chronic kidney disease, or unspecified chronic kidney disease: Secondary | ICD-10-CM | POA: Diagnosis not present

## 2023-03-03 DIAGNOSIS — K5669 Other partial intestinal obstruction: Secondary | ICD-10-CM | POA: Diagnosis present

## 2023-03-03 DIAGNOSIS — E871 Hypo-osmolality and hyponatremia: Secondary | ICD-10-CM | POA: Diagnosis present

## 2023-03-03 DIAGNOSIS — R413 Other amnesia: Secondary | ICD-10-CM | POA: Diagnosis present

## 2023-03-03 DIAGNOSIS — N179 Acute kidney failure, unspecified: Secondary | ICD-10-CM | POA: Diagnosis not present

## 2023-03-03 DIAGNOSIS — Z7901 Long term (current) use of anticoagulants: Secondary | ICD-10-CM | POA: Diagnosis not present

## 2023-03-03 DIAGNOSIS — I509 Heart failure, unspecified: Secondary | ICD-10-CM | POA: Diagnosis not present

## 2023-03-03 DIAGNOSIS — G40909 Epilepsy, unspecified, not intractable, without status epilepticus: Secondary | ICD-10-CM | POA: Diagnosis present

## 2023-03-03 DIAGNOSIS — K3189 Other diseases of stomach and duodenum: Secondary | ICD-10-CM | POA: Diagnosis present

## 2023-03-03 DIAGNOSIS — E861 Hypovolemia: Secondary | ICD-10-CM | POA: Diagnosis present

## 2023-03-03 DIAGNOSIS — Z7984 Long term (current) use of oral hypoglycemic drugs: Secondary | ICD-10-CM

## 2023-03-03 DIAGNOSIS — R112 Nausea with vomiting, unspecified: Secondary | ICD-10-CM | POA: Diagnosis not present

## 2023-03-03 DIAGNOSIS — I5023 Acute on chronic systolic (congestive) heart failure: Secondary | ICD-10-CM | POA: Diagnosis present

## 2023-03-03 DIAGNOSIS — K920 Hematemesis: Secondary | ICD-10-CM | POA: Diagnosis not present

## 2023-03-03 DIAGNOSIS — R918 Other nonspecific abnormal finding of lung field: Secondary | ICD-10-CM | POA: Diagnosis not present

## 2023-03-03 DIAGNOSIS — Z880 Allergy status to penicillin: Secondary | ICD-10-CM

## 2023-03-03 DIAGNOSIS — D6832 Hemorrhagic disorder due to extrinsic circulating anticoagulants: Secondary | ICD-10-CM | POA: Diagnosis present

## 2023-03-03 DIAGNOSIS — E860 Dehydration: Secondary | ICD-10-CM | POA: Diagnosis present

## 2023-03-03 DIAGNOSIS — Z9049 Acquired absence of other specified parts of digestive tract: Secondary | ICD-10-CM

## 2023-03-03 DIAGNOSIS — I052 Rheumatic mitral stenosis with insufficiency: Secondary | ICD-10-CM | POA: Diagnosis present

## 2023-03-03 DIAGNOSIS — N281 Cyst of kidney, acquired: Secondary | ICD-10-CM | POA: Diagnosis not present

## 2023-03-03 DIAGNOSIS — G40009 Localization-related (focal) (partial) idiopathic epilepsy and epileptic syndromes with seizures of localized onset, not intractable, without status epilepticus: Secondary | ICD-10-CM | POA: Insufficient documentation

## 2023-03-03 DIAGNOSIS — K56601 Complete intestinal obstruction, unspecified as to cause: Secondary | ICD-10-CM | POA: Diagnosis not present

## 2023-03-03 DIAGNOSIS — Z79899 Other long term (current) drug therapy: Secondary | ICD-10-CM

## 2023-03-03 DIAGNOSIS — R1084 Generalized abdominal pain: Secondary | ICD-10-CM | POA: Diagnosis not present

## 2023-03-03 DIAGNOSIS — R0902 Hypoxemia: Secondary | ICD-10-CM | POA: Diagnosis not present

## 2023-03-03 DIAGNOSIS — K226 Gastro-esophageal laceration-hemorrhage syndrome: Secondary | ICD-10-CM | POA: Diagnosis not present

## 2023-03-03 DIAGNOSIS — I440 Atrioventricular block, first degree: Secondary | ICD-10-CM | POA: Diagnosis present

## 2023-03-03 DIAGNOSIS — K4090 Unilateral inguinal hernia, without obstruction or gangrene, not specified as recurrent: Secondary | ICD-10-CM | POA: Diagnosis not present

## 2023-03-03 DIAGNOSIS — K921 Melena: Secondary | ICD-10-CM | POA: Diagnosis not present

## 2023-03-03 DIAGNOSIS — I4892 Unspecified atrial flutter: Secondary | ICD-10-CM | POA: Diagnosis not present

## 2023-03-03 DIAGNOSIS — Z4682 Encounter for fitting and adjustment of non-vascular catheter: Secondary | ICD-10-CM | POA: Diagnosis not present

## 2023-03-03 DIAGNOSIS — D72829 Elevated white blood cell count, unspecified: Secondary | ICD-10-CM | POA: Diagnosis present

## 2023-03-03 DIAGNOSIS — I5031 Acute diastolic (congestive) heart failure: Secondary | ICD-10-CM | POA: Diagnosis not present

## 2023-03-03 DIAGNOSIS — Z885 Allergy status to narcotic agent status: Secondary | ICD-10-CM

## 2023-03-03 DIAGNOSIS — I480 Paroxysmal atrial fibrillation: Secondary | ICD-10-CM | POA: Diagnosis not present

## 2023-03-03 HISTORY — DX: Diverticulosis of large intestine without perforation or abscess without bleeding: K57.30

## 2023-03-03 HISTORY — DX: Long term (current) use of anticoagulants: Z79.01

## 2023-03-03 HISTORY — DX: Personal history of other specified conditions: Z87.898

## 2023-03-03 HISTORY — DX: Other amnesia: R41.3

## 2023-03-03 LAB — LACTIC ACID, PLASMA
Lactic Acid, Venous: 1.9 mmol/L (ref 0.5–1.9)
Lactic Acid, Venous: 2.2 mmol/L (ref 0.5–1.9)

## 2023-03-03 LAB — BASIC METABOLIC PANEL
Anion gap: 14 (ref 5–15)
Anion gap: 13 (ref 5–15)
BUN: 50 mg/dL — ABNORMAL HIGH (ref 8–23)
BUN: 49 mg/dL — ABNORMAL HIGH (ref 8–23)
CO2: 28 mmol/L (ref 22–32)
CO2: 24 mmol/L (ref 22–32)
Calcium: 9 mg/dL (ref 8.9–10.3)
Calcium: 8.2 mg/dL — ABNORMAL LOW (ref 8.9–10.3)
Chloride: 84 mmol/L — ABNORMAL LOW (ref 98–111)
Chloride: 89 mmol/L — ABNORMAL LOW (ref 98–111)
Creatinine, Ser: 1.76 mg/dL — ABNORMAL HIGH (ref 0.61–1.24)
Creatinine, Ser: 1.56 mg/dL — ABNORMAL HIGH (ref 0.61–1.24)
GFR, Estimated: 40 mL/min — ABNORMAL LOW (ref >60)
GFR, Estimated: 46 mL/min — ABNORMAL LOW (ref >60)
Glucose, Bld: 146 mg/dL — ABNORMAL HIGH (ref 70–99)
Glucose, Bld: 150 mg/dL — ABNORMAL HIGH (ref 70–99)
Potassium: 4.4 mmol/L (ref 3.5–5.1)
Potassium: 4.2 mmol/L (ref 3.5–5.1)
Sodium: 126 mmol/L — ABNORMAL LOW (ref 135–145)
Sodium: 126 mmol/L — ABNORMAL LOW (ref 135–145)

## 2023-03-03 LAB — COMPREHENSIVE METABOLIC PANEL
ALT: 13 U/L (ref 0–44)
AST: 26 U/L (ref 15–41)
Albumin: 4.8 g/dL (ref 3.5–5.0)
Alkaline Phosphatase: 52 U/L (ref 38–126)
Anion gap: 14 (ref 5–15)
BUN: 42 mg/dL — ABNORMAL HIGH (ref 8–23)
CO2: 31 mmol/L (ref 22–32)
Calcium: 9.6 mg/dL (ref 8.9–10.3)
Chloride: 82 mmol/L — ABNORMAL LOW (ref 98–111)
Creatinine, Ser: 1.81 mg/dL — ABNORMAL HIGH (ref 0.61–1.24)
GFR, Estimated: 38 mL/min — ABNORMAL LOW (ref >60)
Glucose, Bld: 136 mg/dL — ABNORMAL HIGH (ref 70–99)
Potassium: 4.3 mmol/L (ref 3.5–5.1)
Sodium: 127 mmol/L — ABNORMAL LOW (ref 135–145)
Total Bilirubin: 2.5 mg/dL — ABNORMAL HIGH (ref <1.2)
Total Protein: 8 g/dL (ref 6.5–8.1)

## 2023-03-03 LAB — BLOOD GAS, VENOUS
Acid-Base Excess: 8.6 mmol/L — ABNORMAL HIGH (ref 0.0–2.0)
Bicarbonate: 31.6 mmol/L — ABNORMAL HIGH (ref 20.0–28.0)
O2 Saturation: 94.9 %
Patient temperature: 36.9
pCO2, Ven: 37 mm[Hg] — ABNORMAL LOW (ref 44–60)
pH, Ven: 7.54 — ABNORMAL HIGH (ref 7.25–7.43)
pO2, Ven: 66 mm[Hg] — ABNORMAL HIGH (ref 32–45)

## 2023-03-03 LAB — CBC WITH DIFFERENTIAL/PLATELET
Abs Immature Granulocytes: 0.16 10*3/uL — ABNORMAL HIGH (ref 0.00–0.07)
Abs Immature Granulocytes: 0.55 10*3/uL — ABNORMAL HIGH (ref 0.00–0.07)
Basophils Absolute: 0 10*3/uL (ref 0.0–0.1)
Basophils Absolute: 0 10*3/uL (ref 0.0–0.1)
Basophils Relative: 0 %
Basophils Relative: 0 %
Eosinophils Absolute: 0 10*3/uL (ref 0.0–0.5)
Eosinophils Absolute: 0 10*3/uL (ref 0.0–0.5)
Eosinophils Relative: 0 %
Eosinophils Relative: 0 %
HCT: 45.9 % (ref 39.0–52.0)
HCT: 33.6 % — ABNORMAL LOW (ref 39.0–52.0)
Hemoglobin: 16.2 g/dL (ref 13.0–17.0)
Hemoglobin: 11.6 g/dL — ABNORMAL LOW (ref 13.0–17.0)
Immature Granulocytes: 1 %
Immature Granulocytes: 2 %
Lymphocytes Relative: 6 %
Lymphocytes Relative: 3 %
Lymphs Abs: 1.2 10*3/uL (ref 0.7–4.0)
Lymphs Abs: 0.9 10*3/uL (ref 0.7–4.0)
MCH: 30.7 pg (ref 26.0–34.0)
MCH: 31.8 pg (ref 26.0–34.0)
MCHC: 35.3 g/dL (ref 30.0–36.0)
MCHC: 34.5 g/dL (ref 30.0–36.0)
MCV: 87.1 fL (ref 80.0–100.0)
MCV: 92.1 fL (ref 80.0–100.0)
Monocytes Absolute: 1.7 10*3/uL — ABNORMAL HIGH (ref 0.1–1.0)
Monocytes Absolute: 2.4 10*3/uL — ABNORMAL HIGH (ref 0.1–1.0)
Monocytes Relative: 8 %
Monocytes Relative: 7 %
Neutro Abs: 18.6 10*3/uL — ABNORMAL HIGH (ref 1.7–7.7)
Neutro Abs: 29.8 10*3/uL — ABNORMAL HIGH (ref 1.7–7.7)
Neutrophils Relative %: 85 %
Neutrophils Relative %: 88 %
Platelets: 181 10*3/uL (ref 150–400)
Platelets: 155 10*3/uL (ref 150–400)
RBC: 5.27 MIL/uL (ref 4.22–5.81)
RBC: 3.65 MIL/uL — ABNORMAL LOW (ref 4.22–5.81)
RDW: 13.5 % (ref 11.5–15.5)
RDW: 13.6 % (ref 11.5–15.5)
WBC: 21.7 10*3/uL — ABNORMAL HIGH (ref 4.0–10.5)
WBC: 33.7 10*3/uL — ABNORMAL HIGH (ref 4.0–10.5)
nRBC: 0 % (ref 0.0–0.2)
nRBC: 0 % (ref 0.0–0.2)

## 2023-03-03 LAB — HEMOGLOBIN AND HEMATOCRIT, BLOOD
HCT: 40.9 % (ref 39.0–52.0)
Hemoglobin: 14.3 g/dL (ref 13.0–17.0)

## 2023-03-03 LAB — PROTIME-INR
INR: 2.9 — ABNORMAL HIGH (ref 0.8–1.2)
INR: 4.1 (ref 0.8–1.2)
Prothrombin Time: 30.3 s — ABNORMAL HIGH (ref 11.4–15.2)
Prothrombin Time: 40.2 s — ABNORMAL HIGH (ref 11.4–15.2)

## 2023-03-03 LAB — LIPASE, BLOOD: Lipase: <10 U/L — ABNORMAL LOW (ref 11–51)

## 2023-03-03 LAB — PHOSPHORUS: Phosphorus: 4 mg/dL (ref 2.5–4.6)

## 2023-03-03 LAB — TROPONIN I (HIGH SENSITIVITY)
Troponin I (High Sensitivity): 104 ng/L (ref <18)
Troponin I (High Sensitivity): 91 ng/L — ABNORMAL HIGH (ref <18)

## 2023-03-03 LAB — MAGNESIUM: Magnesium: 2 mg/dL (ref 1.7–2.4)

## 2023-03-03 LAB — OCCULT BLOOD X 1 CARD TO LAB, STOOL: Fecal Occult Bld: POSITIVE — AB

## 2023-03-03 MED ORDER — LIDOCAINE HCL URETHRAL/MUCOSAL 2 % EX GEL
1.0000 | Freq: Once | CUTANEOUS | Status: DC
  Filled 2023-03-03: qty 11

## 2023-03-03 MED ORDER — ACETAMINOPHEN 650 MG RE SUPP
650.0000 mg | Freq: Four times a day (QID) | RECTAL | Status: DC | PRN
  Administered 2023-03-03 – 2023-03-04 (×2): 650 mg via RECTAL
  Filled 2023-03-03 (×2): qty 1

## 2023-03-03 MED ORDER — LACTATED RINGERS IV BOLUS
1000.0000 mL | Freq: Once | INTRAVENOUS | Status: AC
  Administered 2023-03-03: 1000 mL via INTRAVENOUS

## 2023-03-03 MED ORDER — NAPHAZOLINE-GLYCERIN 0.012-0.25 % OP SOLN: 1.0000 [drp] | Freq: Four times a day (QID) | OPHTHALMIC | Status: DC | PRN

## 2023-03-03 MED ORDER — PHENYLEPHRINE HCL-NACL 20-0.9 MG/250ML-% IV SOLN
25.0000 ug/min | INTRAVENOUS | Status: DC
Start: 2023-03-03 — End: 2023-03-05
  Administered 2023-03-03: 25 ug/min via INTRAVENOUS
  Administered 2023-03-04: 35 ug/min via INTRAVENOUS
  Administered 2023-03-04: 25 ug/min via INTRAVENOUS
  Administered 2023-03-05: 45 ug/min via INTRAVENOUS
  Filled 2023-03-03 (×4): qty 250

## 2023-03-03 MED ORDER — DIPHENHYDRAMINE HCL 50 MG/ML IJ SOLN
25.0000 mg | Freq: Once | INTRAMUSCULAR | Status: AC
  Administered 2023-03-03: 25 mg via INTRAVENOUS
  Filled 2023-03-03: qty 1

## 2023-03-03 MED ORDER — BISACODYL 10 MG RE SUPP: 10.0000 mg | Freq: Two times a day (BID) | RECTAL | Status: DC | PRN

## 2023-03-03 MED ORDER — SODIUM CHLORIDE 0.9% IV SOLUTION: Freq: Once | INTRAVENOUS | Status: DC

## 2023-03-03 MED ORDER — ONDANSETRON HCL 4 MG/2ML IJ SOLN
4.0000 mg | Freq: Four times a day (QID) | INTRAMUSCULAR | Status: DC | PRN
  Administered 2023-03-03 – 2023-03-04 (×2): 4 mg via INTRAVENOUS
  Filled 2023-03-03 (×2): qty 2

## 2023-03-03 MED ORDER — FENTANYL CITRATE PF 50 MCG/ML IJ SOSY
12.5000 ug | PREFILLED_SYRINGE | INTRAMUSCULAR | Status: DC | PRN
  Administered 2023-03-03: 12.5 ug via INTRAVENOUS
  Administered 2023-03-06: 25 ug via INTRAVENOUS
  Administered 2023-03-07: 12.5 ug via INTRAVENOUS
  Filled 2023-03-03 (×3): qty 1

## 2023-03-03 MED ORDER — PANTOPRAZOLE SODIUM 40 MG IV SOLR: 40.0000 mg | Freq: Once | INTRAVENOUS | Status: AC

## 2023-03-03 MED ORDER — DIATRIZOATE MEGLUMINE & SODIUM 66-10 % PO SOLN
90.0000 mL | Freq: Once | ORAL | Status: AC
  Administered 2023-03-04: 90 mL via NASOGASTRIC
  Filled 2023-03-03: qty 90

## 2023-03-03 MED ORDER — PHENYLEPHRINE HCL-NACL 20-0.9 MG/250ML-% IV SOLN
25.0000 ug/min | INTRAVENOUS | Status: DC
Start: 2023-03-03 — End: 2023-03-03

## 2023-03-03 MED ORDER — ALUM & MAG HYDROXIDE-SIMETH 200-200-20 MG/5ML PO SUSP: 30.0000 mL | Freq: Four times a day (QID) | ORAL | Status: DC | PRN

## 2023-03-03 MED ORDER — PANTOPRAZOLE SODIUM 40 MG IV SOLR
INTRAVENOUS | Status: AC
  Administered 2023-03-03: 40 mg via INTRAVENOUS
  Filled 2023-03-03: qty 10

## 2023-03-03 MED ORDER — SALINE SPRAY 0.65 % NA SOLN: 1.0000 | Freq: Four times a day (QID) | NASAL | Status: DC | PRN

## 2023-03-03 MED ORDER — ONDANSETRON HCL 4 MG/2ML IJ SOLN
4.0000 mg | Freq: Once | INTRAMUSCULAR | Status: AC
  Administered 2023-03-03: 4 mg via INTRAVENOUS
  Filled 2023-03-03: qty 2

## 2023-03-03 MED ORDER — ACETAMINOPHEN 650 MG RE SUPP: 650.0000 mg | Freq: Four times a day (QID) | RECTAL | Status: DC | PRN

## 2023-03-03 MED ORDER — LEVETIRACETAM IN NACL 500 MG/100ML IV SOLN
500.0000 mg | INTRAVENOUS | Status: DC
  Administered 2023-03-04 – 2023-03-14 (×11): 500 mg via INTRAVENOUS
  Filled 2023-03-03 (×13): qty 100

## 2023-03-03 MED ORDER — LEVETIRACETAM IN NACL 500 MG/100ML IV SOLN
500.0000 mg | Freq: Two times a day (BID) | INTRAVENOUS | Status: DC
  Filled 2023-03-03: qty 100

## 2023-03-03 MED ORDER — MAGIC MOUTHWASH: 15.0000 mL | Freq: Four times a day (QID) | ORAL | Status: DC | PRN

## 2023-03-03 MED ORDER — ALBUMIN HUMAN 25 % IV SOLN
25.0000 g | Freq: Four times a day (QID) | INTRAVENOUS | Status: AC
Start: 2023-03-03 — End: 2023-03-04
  Administered 2023-03-03 – 2023-03-04 (×4): 25 g via INTRAVENOUS
  Filled 2023-03-03 (×4): qty 100

## 2023-03-03 MED ORDER — LACTATED RINGERS IV BOLUS: 1000.0000 mL | Freq: Three times a day (TID) | INTRAVENOUS | Status: AC | PRN

## 2023-03-03 MED ORDER — MENTHOL 3 MG MT LOZG: 1.0000 | LOZENGE | OROMUCOSAL | Status: DC | PRN

## 2023-03-03 MED ORDER — SODIUM CHLORIDE 0.9 % IV SOLN
250.0000 mL | INTRAVENOUS | Status: AC
  Administered 2023-03-03: 250 mL via INTRAVENOUS

## 2023-03-03 MED ORDER — MAGNESIUM SULFATE 2 GM/50ML IV SOLN
2.0000 g | Freq: Once | INTRAVENOUS | Status: AC
  Administered 2023-03-03: 2 g via INTRAVENOUS
  Filled 2023-03-03: qty 50

## 2023-03-03 MED ORDER — LACTATED RINGERS IV SOLN: INTRAVENOUS | Status: AC

## 2023-03-03 MED ORDER — LEVETIRACETAM IN NACL 500 MG/100ML IV SOLN
500.0000 mg | Freq: Once | INTRAVENOUS | Status: AC
  Administered 2023-03-03: 500 mg via INTRAVENOUS
  Filled 2023-03-03: qty 100

## 2023-03-03 MED ORDER — SODIUM CHLORIDE 0.9 % IV SOLN: 8.0000 mg | Freq: Four times a day (QID) | INTRAVENOUS | Status: DC | PRN

## 2023-03-03 MED ORDER — PROCHLORPERAZINE EDISYLATE 10 MG/2ML IJ SOLN
5.0000 mg | INTRAMUSCULAR | Status: DC | PRN
  Administered 2023-03-03 – 2023-03-05 (×2): 10 mg via INTRAVENOUS
  Filled 2023-03-03 (×2): qty 2

## 2023-03-03 MED ORDER — SODIUM CHLORIDE 0.9 % IV SOLN: 250.0000 mL | INTRAVENOUS | Status: DC

## 2023-03-03 MED ORDER — LACTATED RINGERS IV BOLUS
1000.0000 mL | Freq: Once | INTRAVENOUS | Status: AC
  Administered 2023-03-03: 500 mL via INTRAVENOUS

## 2023-03-03 MED ORDER — LEVETIRACETAM 500 MG/5ML IV SOLN
750.0000 mg | INTRAVENOUS | Status: DC
  Administered 2023-03-03 – 2023-03-14 (×13): 750 mg via INTRAVENOUS
  Filled 2023-03-03 (×12): qty 7.5

## 2023-03-03 MED ORDER — METHOCARBAMOL 1000 MG/10ML IJ SOLN: 1000.0000 mg | Freq: Four times a day (QID) | INTRAMUSCULAR | Status: DC | PRN

## 2023-03-03 MED ORDER — ACETAMINOPHEN 325 MG PO TABS: 650.0000 mg | ORAL_TABLET | Freq: Four times a day (QID) | ORAL | Status: DC | PRN

## 2023-03-03 MED ORDER — PHENOL 1.4 % MT LIQD: 2.0000 | OROMUCOSAL | Status: DC | PRN

## 2023-03-03 MED ORDER — PANTOPRAZOLE SODIUM 40 MG IV SOLR
40.0000 mg | Freq: Two times a day (BID) | INTRAVENOUS | Status: DC
  Administered 2023-03-03 – 2023-03-04 (×2): 40 mg via INTRAVENOUS
  Filled 2023-03-03 (×2): qty 10

## 2023-03-03 NOTE — ED Provider Notes (Signed)
I saw and evaluated the patient, reviewed the resident's note and I agree with the findings and plan.  EKG Interpretation Date/Time:  Sunday March 03 2023 08:20:02 EST Ventricular Rate:  82 PR Interval:  251 QRS Duration:  126 QT Interval:  402 QTC Calculation: 470 R Axis:   -50  Text Interpretation: Sinus rhythm Prolonged PR interval Probable left atrial enlargement Nonspecific IVCD with LAD Left ventricular hypertrophy ST elevation, consider anterior injury Baseline wander in lead(s) V1 V2 Confirmed by Lorre Nick ([id number]) on 03/03/2023 8:31:92 AM    77 year old presents with several days of coffee-ground emesis.  He is on Coumadin for an aortic valve.  Denies any prior history of GI bleed.  There is no some epigastric pain.  Denies any black stools.  His EKG per interpretation shows normal sinus rhythm.  Suspect upper GI bleed and patient will be started on Protonix.  He is a patient of [facility].  Patient will require admission likely   Lorre Nick, MD 03/03/23 [phone]

## 2023-03-03 NOTE — Consult Note (Addendum)
Alan Willis [id number]  12/25/1945 [id number]  CARE TEAM:  PCP: Alan Hazel, MD  Outpatient Care Team: Patient Care Team: Alan Hazel, MD as PCP - General (Family Medicine) Alan Clines, MD as Consulting Physician (Neurology) Alan Clines, MD as Consulting Physician (Neurology) Alan Rack, MD as Referring Physician (Cardiology) Alan Batten, MD (Inactive) as Referring Physician (Cardiology) Alan Shiver, MD as Consulting Physician (Gastroenterology)  Inpatient Treatment Team: Treatment Team:  Bobette Mo, MD Tammy Sours, RN Ccs, Md, MD Small, Ginette Otto, RN Arlean Hopping, MD Willis Modena, MD Lbcardiology, Rounding, MD   This patient is a 77 y.o.male who presents today for surgical evaluation at the request of Alan Willis, MCDB ED.   Chief complaint / Reason for evaluation: Hematemesis with abdominal pain.  77 year old male with numerous health issues.  History of rheumatic and nonrheumatic valvular disease.  Aortic stenosis status post valve replacement with mechanical heart valve 1983 chronically anticoagulated on warfarin.  Some chronic kidney disease.  Atrial fibrillation.  Followed by neurology for some occasional seizures and memory loss.  Followed by [facility].  Noticed abdominal pain with nausea and vomiting and some loose bowel movements.  Some coffee-ground emesis and dark stools.  Got to the point he could not keep anything down.  Became worse.  Came to emergency department this morning.  Found to be dehydrated with elevated creatinine.  INR 2.9.  CT scan shows dilated gallbladder with gallstone but no cholecystitis.  Also has dilated small bowel with transition point in anterior abdominal wall near midline incision  Recommendation made for hospitalization.  Medical and surgical consultation.  Patient arrives [facility] tachycardic with heart rate in 130s and elevated creatinine.  Somewhat elevated troponin as well.   Patient had laparoscopic bilateral inguinal hernia repairs in 2000.  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 Gerrit Willis with our group in 2007.  Pathology showed diverticulitis as the etiology.  Has been scoped by [facility] Dr. Evette Cristal in the distant past.  Do not know current gastroenterologist.   Assessment  Alan Willis  77 y.o. male       Problem List:  Principal Problem:   GI bleed Active Problems:   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 Mallie Mussel)   Chronic anticoagulation   H/O heart valve replacement with mechanical valve   Memory loss   History of seizures   AKI (acute kidney injury) (HCC)   Gallstone   CKD (chronic kidney disease) stage 2, GFR 60-89 ml/min   Elevated troponin   Recurrent right inguinal hernia   Atrial fibrillation with RVR (HCC)   Diverticulosis of colon with hemorrhage   Multiple issues going on  Plan:  Small bowel obstruction most likely due to adhesions from prior sigmoid colectomy with fecalization.  Suspect this has been a chronic issue but is acutely obstructed.  Agree with NG tube.  I wrote orders.  Keep NPO.  I would not trust his digestive tract for any enteral medications to work.  There is no evidence of any pneumoperitoneum/ perforation or pneumatosis.  There is no evidence of any strangulated hernia or closed-loop obstruction or volvulus.  I see no hard indications for emergency surgery at this time.  With him in uncontrolled atrial fibrillation and fully anticoagulated, his surgical risks are extremely high right now so would be better to try and medically optimize him through internal medicine and possibly critical  care and/or cardiology.  If he needs emergency surgery, need to correct his INR to be less than 1.7 before considering that.  Then could do heparin drip intermittently if needed.  Consider vitamin K.  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  I suspect he is got a dilated gallbladder with may be some ileus but he is not have any active cholecystitis and that is not the etiology of what is going on.  Patient also looks like he has a recurrent right inguinal hernia containing a small loop of nonobstructed bowel.  That is not the source of obstruction and there is no hard indications for intervention repair.  Aggressive IV fluid resuscitation in the setting of dehydration.  I do not know if he really needs antibiotics from a surgery standpoint but most likely suspect medicine will want to start with that and regroup.  Usually do Cipro/Flagyl for abdominal coverage.  With his vomiting confusion will order chest x-ray to make sure he did not have aspiration event.  With his penicillin allergy, assume Levaquin for pulmonary coverage.  Defer to medicine.  Coffee-ground emesis and GI bleeding most likely due to being fully anticoagulated with nausea.  Reasonable to do IV PPI for now and regroup.  Hemoglobin not severely low.  Suspect he is falsely elevated in his hemoglobin because he is dehydrated.  Probably will drop a few points once he is appropriate resuscitated.  We will see.  Cardiac, renal, neurological, and other issues per primary  Surgery will help follow.    I reviewed nursing notes, ED provider notes, last 24 h vitals and pain scores, last 48 h intake and output, last 24 h labs and trends, and last 24 h imaging results. I have reviewed this patient's available data, including medical history, events of note, test results, etc as part of my evaluation.  A significant portion of that time was spent in counseling.  Care during the described time interval was provided by me.  This care required high  level of medical decision making.  03/03/2023  Alan Sportsman, MD, FACS, MASCRS Esophageal, Gastrointestinal & Colorectal Surgery Robotic and Minimally Invasive Surgery  [facility] [address] Alan [place], [place] [address] [phone] Fax [phone] Main  CONTACT INFORMATION: Weekday (9AM-5PM): Call [facility] main office at [phone] Weeknight (5PM-9AM) or Weekend/Holiday: Check EPIC "Web Links" tab & use "AMION" (password " [email or web address]") for General Surgery [facility] coverage  Please, DO NOT use SecureChat  (it is not reliable communication to reach operating surgeons & will lead to a delay in care).   Epic staff messaging available for outptient concerns needing 1-2 business day response.      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   Stricture of sigmoid colon s/p colectomy 2007 2007    Past Surgical History:  Procedure Laterality Date   ATRIAL FIBRILLATION ABLATION     COLON RESECTION SIGMOID  07/26/2005   Dr Alan Willis for diverticular sigmoid colon stricture  LAPAROSCOPIC INGUINAL HERNIA REPAIR Bilateral 04/03/1999   Dr Lowella Curb   VALVE REPLACEMENT      Social History   Socioeconomic History   Marital status: Married    Spouse name: Not on file   Number of children: Not on file   Years of education: Not on file   Highest education level: Not on file  Occupational History   Not on file  Tobacco Use   Smoking status: Some Days    Types: Cigarettes, Cigars   Smokeless tobacco: Never  Vaping Use   Vaping status: Never Used  Substance and Sexual Activity   Alcohol use: Not Currently    Comment: daily   Drug use: Never   Sexual activity: Not on file  Other Topics Concern   Not on file  Social  History Narrative   Right handed    Lives with family    Retired    Drinks a etoh free beer a day    Social Determinants of Corporate investment banker Strain: Not on file  Food Insecurity: No Food Insecurity (03/03/2023)   Hunger Vital Sign    Worried About Running Out of Food in the Last Year: Never true    Ran Out of Food in the Last Year: Never true  Transportation Needs: No Transportation Needs (03/03/2023)   PRAPARE - Administrator, Civil Service (Medical): No    Lack of Transportation (Non-Medical): No  Physical Activity: Not on file  Stress: Not on file  Social Connections: Not on file  Intimate Partner Violence: Not on file    History reviewed. No pertinent family history.  Current Facility-Administered Medications  Medication Dose Route Frequency Provider Last Rate Last Admin   0.9 %  sodium chloride infusion  250 mL Intravenous Continuous Bobette Mo, MD       acetaminophen (TYLENOL) suppository 650 mg  650 mg Rectal Q6H PRN Karie Soda, MD       albumin human 25 % solution 25 g  25 g Intravenous Q6H Lorin Glass, MD       alum & mag hydroxide-simeth (MAALOX/MYLANTA) 200-200-20 MG/5ML suspension 30 mL  30 mL Oral Q6H PRN Karie Soda, MD       bisacodyl (DULCOLAX) suppository 10 mg  10 mg Rectal Q12H PRN Karie Soda, MD       fentaNYL (SUBLIMAZE) injection 12.5-25 mcg  12.5-25 mcg Intravenous Q2H PRN Karie Soda, MD       lactated ringers bolus 1,000 mL  1,000 mL Intravenous Q8H PRN Karie Soda, MD       lidocaine (XYLOCAINE) 2 % jelly 1 Application  1 Application Topical Once Lorre Nick, MD       magic mouthwash  15 mL Oral QID PRN Karie Soda, MD       magnesium sulfate IVPB 2 g 50 mL  2 g Intravenous Once Lorin Glass, MD       menthol-cetylpyridinium (CEPACOL) lozenge 3 mg  1 lozenge Oral PRN Karie Soda, MD       methocarbamol (ROBAXIN) injection 1,000 mg  1,000 mg Intravenous Q6H PRN Karie Soda, MD        naphazoline-glycerin (CLEAR EYES REDNESS) ophth solution 1-2 drop  1-2 drop Both Eyes QID PRN Karie Soda, MD       ondansetron [facility]) injection 4 mg  4 mg Intravenous Q6H PRN Karie Soda, MD       Or   ondansetron (ZOFRAN) 8 mg in sodium chloride 0.9 %  50 mL IVPB  8 mg Intravenous Q6H PRN Karie Soda, MD       pantoprazole (PROTONIX) injection 40 mg  40 mg Intravenous Q12H Bobette Mo, MD       phenol [facility]) mouth spray 2 spray  2 spray Mouth/Throat PRN Karie Soda, MD       phenylephrine (NEO-SYNEPHRINE) 20mg /NS premix infusion  25-200 mcg/min Intravenous Titrated Bobette Mo, MD       prochlorperazine (COMPAZINE) injection 5-10 mg  5-10 mg Intravenous Q4H PRN Karie Soda, MD   10 mg at 03/03/23 1504   sodium chloride (OCEAN) 0.65 % nasal spray 1-2 spray  1-2 spray Each Nare Q6H PRN Karie Soda, MD         Allergies  Allergen Reactions   Codeine Other (See Comments)    Stomach upset   Amoxicillin-Pot Clavulanate Rash     BP 97/66 (BP Location: Right Arm)   Pulse (!) 137   Temp 98 F (36.7 C) (Oral)   Resp (!) 23   Ht 5\' 10"  (1.778 m)   Wt 79.3 kg   SpO2 95%   BMI 25.08 kg/m     Results:   Labs: Results for orders placed or performed during the hospital encounter of 03/03/23 (from the past 48 hour(s))  Comprehensive metabolic panel     Status: Abnormal   Collection Time: 03/03/23  8:43 AM  Result Value Ref Range   Sodium 127 (L) 135 - 145 mmol/L   Potassium 4.3 3.5 - 5.1 mmol/L   Chloride 82 (L) 98 - 111 mmol/L   CO2 31 22 - 32 mmol/L   Glucose, Bld 136 (H) 70 - 99 mg/dL    Comment: Glucose reference range applies only to samples taken after fasting for at least 8 hours.   BUN 42 (H) 8 - 23 mg/dL   Creatinine, Ser 2.77 (H) 0.61 - 1.24 mg/dL   Calcium 9.6 8.9 - 82.4 mg/dL   Total Protein 8.0 6.5 - 8.1 g/dL   Albumin 4.8 3.5 - 5.0 g/dL   AST 26 15 - 41 U/L   ALT 13 0 - 44 U/L   Alkaline Phosphatase 52 38 - 126 U/L   Total  Bilirubin 2.5 (H) <1.2 mg/dL   GFR, Estimated 38 (L) >60 mL/min    Comment: (NOTE) Calculated using the CKD-EPI Creatinine Equation (2021)    Anion gap 14 5 - 15    Comment: Performed at [facility], [address], [place], [place] [address]  Troponin I (High Sensitivity)     Status: Abnormal   Collection Time: 03/03/23  8:43 AM  Result Value Ref Range   Troponin I (High Sensitivity) 104 (HH) <18 ng/L    Comment: CRITICAL RESULT CALLED TO, READ BACK BY AND VERIFIED WITH: PEGRAM,J@[id number]  BY MATTHEWS, B 11.17.2024 (NOTE) Elevated high sensitivity troponin I (hsTnI) values and significant  changes across serial measurements may suggest ACS but many other  chronic and acute conditions are known to elevate hsTnI results.  Refer to the Links section for chest pain algorithms and additional  guidance. Performed at [facility], [address] [place], [place] [address]   CBC with Differential     Status: Abnormal   Collection Time: 03/03/23  8:43 AM  Result Value Ref Range   WBC 21.7 (H) 4.0 - 10.5 K/uL   RBC 5.27 4.22 - 5.81 MIL/uL   Hemoglobin 16.2 13.0 - 17.0 g/dL   HCT 54.0 08.6 - 76.1 %  MCV 87.1 80.0 - 100.0 fL   MCH 30.7 26.0 - 34.0 pg   MCHC 35.3 30.0 - 36.0 g/dL   RDW 44.0 34.7 - 42.5 %   Platelets 181 150 - 400 K/uL   nRBC 0.0 0.0 - 0.2 %   Neutrophils Relative % 85 %   Neutro Abs 18.6 (H) 1.7 - 7.7 K/uL   Lymphocytes Relative 6 %   Lymphs Abs 1.2 0.7 - 4.0 K/uL   Monocytes Relative 8 %   Monocytes Absolute 1.7 (H) 0.1 - 1.0 K/uL   Eosinophils Relative 0 %   Eosinophils Absolute 0.0 0.0 - 0.5 K/uL   Basophils Relative 0 %   Basophils Absolute 0.0 0.0 - 0.1 K/uL   Immature Granulocytes 1 %   Abs Immature Granulocytes 0.16 (H) 0.00 - 0.07 K/uL    Comment: Performed at [facility], [address], [place], [place] [address]  Occult blood card to lab, stool     Status: Abnormal   Collection Time: 03/03/23   8:43 AM  Result Value Ref Range   Fecal Occult Bld POSITIVE (A) NEGATIVE    Comment: Performed at [facility], [address], [place], [place] [address]  Protime-INR     Status: Abnormal   Collection Time: 03/03/23  8:43 AM  Result Value Ref Range   Prothrombin Time 30.3 (H) 11.4 - 15.2 seconds   INR 2.9 (H) 0.8 - 1.2    Comment: (NOTE) INR goal varies based on device and disease states. Performed at [facility], [address] [place], [place] [address]   Troponin I (High Sensitivity)     Status: Abnormal   Collection Time: 03/03/23 10:48 AM  Result Value Ref Range   Troponin I (High Sensitivity) 91 (H) <18 ng/L    Comment: (NOTE) Elevated high sensitivity troponin I (hsTnI) values and significant  changes across serial measurements may suggest ACS but many other  chronic and acute conditions are known to elevate hsTnI results.  Refer to the "Links" section for chest pain algorithms and additional  guidance. Performed at [facility], [address] [place], [place] [address]   Lipase, blood     Status: Abnormal   Collection Time: 03/03/23 10:48 AM  Result Value Ref Range   Lipase <10 (L) 11 - 51 U/L    Comment: Performed at [facility], [address], [place], [place] [address]  Type and screen [facility]     Status: None   Collection Time: 03/03/23  1:56 PM  Result Value Ref Range   ABO/RH(D) O POS    Antibody Screen NEG    Sample Expiration      03/06/2023,2359 Performed at [facility], [address] [place], [place] [address]   Hemoglobin and hematocrit, blood     Status: None   Collection Time: 03/03/23  2:04 PM  Result Value Ref Range   Hemoglobin 14.3 13.0 - 17.0 g/dL   HCT 93.2 35.5 - 73.2 %    Comment: Performed at [facility], [address], [place], [place] [address]  Basic metabolic panel     Status: Abnormal    Collection Time: 03/03/23  2:04 PM  Result Value Ref Range   Sodium 126 (L) 135 - 145 mmol/L   Potassium 4.4 3.5 - 5.1 mmol/L   Chloride 84 (L) 98 - 111 mmol/L   CO2 28 22 - 32 mmol/L   Glucose, Bld 146 (H)  70 - 99 mg/dL    Comment: Glucose reference range applies only to samples taken after fasting for at least 8 hours.   BUN 50 (H) 8 - 23 mg/dL   Creatinine, Ser 7.82 (H) 0.61 - 1.24 mg/dL   Calcium 9.0 8.9 - 95.6 mg/dL   GFR, Estimated 40 (L) >60 mL/min    Comment: (NOTE) Calculated using the CKD-EPI Creatinine Equation (2021)    Anion gap 14 5 - 15    Comment: Performed at [facility], [address], [place], [place] [address]  Magnesium     Status: None   Collection Time: 03/03/23  2:04 PM  Result Value Ref Range   Magnesium 2.0 1.7 - 2.4 mg/dL    Comment: Performed at [facility], [address] [place], [place] [address]  Phosphorus     Status: None   Collection Time: 03/03/23  2:04 PM  Result Value Ref Range   Phosphorus 4.0 2.5 - 4.6 mg/dL    Comment: Performed at [facility], [address] [place], [place] [address]    Imaging / Studies: DG Abd Portable 1 View  Result Date: 03/03/2023 CLINICAL DATA:  Status post NG tube placement. EXAM: PORTABLE ABDOMEN - 1 VIEW COMPARISON:  CT abdomen and pelvis 03/03/2023 FINDINGS: An enteric tube has been placed with its tip overlying the distal esophagus near the GE junction. Gastric and small bowel distension were more fully evaluated on today's CT. A calcified gallstone and aortic valve replacement are noted. IMPRESSION: Enteric tube tip near the GE junction. Recommend advancement and obtaining a repeat radiograph to ensure intragastric placement. Electronically Signed   By: Sebastian Ache M.D.   On: 03/03/2023 12:42   CT ABDOMEN PELVIS WO CONTRAST  Result Date: 03/03/2023 CLINICAL DATA:  Abdominal pain, acute, nonlocalized EXAM: CT ABDOMEN AND PELVIS WITHOUT  CONTRAST TECHNIQUE: Multidetector CT imaging of the abdomen and pelvis was performed following the standard protocol without IV contrast. RADIATION DOSE REDUCTION: This exam was performed according to the departmental dose-optimization program which includes automated exposure control, adjustment of the mA and/or kV according to patient size and/or use of iterative reconstruction technique. COMPARISON:  04/14/2020 FINDINGS: Lower chest: Lung bases are clear.  Coronary artery atherosclerosis. Hepatobiliary: No focal liver abnormality identified on noncontrast images. Markedly dilated gallbladder. 1.7 cm stone in the region of the gallbladder neck. No pericholecystic inflammatory changes by CT. No intrahepatic biliary dilatation. Pancreas: Unremarkable. No pancreatic ductal dilatation or surrounding inflammatory changes. Spleen: Normal in size without focal abnormality. Adrenals/Urinary Tract: Unremarkable adrenal glands. Bilateral renal cysts, which require no follow-up imaging. Kidneys otherwise unremarkable. No stone or hydronephrosis. Urinary bladder within normal limits. Stomach/Bowel: Fluid within the distal esophagus. Moderately dilated stomach. Multiple fluid-filled, dilated loops of small bowel throughout the abdomen with abrupt transition to decompressed bowel anteriorly within the central abdomen (series 2, image 54). There is fecalization of small bowel content immediately upstream to the transition site. Distal small bowel is decompressed. A small loop of distal small bowel extends into patient's right inguinal hernia. Normal appendix in the right lower quadrant. Colonic diverticulosis. No colonic wall thickening or inflammatory changes. Vascular/Lymphatic: Extensive aortoiliac atherosclerosis. No lymphadenopathy. Reproductive: Prostate gland within normal limits. Other: No ascites or pneumoperitoneum. Right inguinal hernia containing fat and distal small bowel. Musculoskeletal: No acute or significant  osseous findings. IMPRESSION: 1. Small-bowel obstruction with abrupt transition to decompressed bowel anteriorly within the central abdomen, possibly secondary to adhesions. 2. Markedly dilated gallbladder  with 1.7 cm stone in the region of the gallbladder neck. No pericholecystic inflammatory changes by CT. Correlate clinically for acute cholecystitis. 3. Right inguinal hernia containing fat and distal small bowel. 4. Colonic diverticulosis without evidence of acute diverticulitis. 5. Aortic atherosclerosis (ICD10-I70.0). These results were called by telephone at the time of interpretation on 03/03/2023 at 10:26 am to provider Lorre Nick , who verbally acknowledged these results. Electronically Signed   By: Duanne Guess D.O.   On: 03/03/2023 10:27    Medications / Allergies: per chart  Antibiotics: Anti-infectives (From admission, onward)    None         Note: Portions of this report may have been transcribed using voice recognition software. Every effort was made to ensure accuracy; however, inadvertent computerized transcription errors may be present.   Any transcriptional errors that result from this process are unintentional.    Alan Sportsman, MD, FACS, MASCRS Esophageal, Gastrointestinal & Colorectal Surgery Robotic and Minimally Invasive Surgery  [facility] [address] [place], [place] [address] [phone] Fax [phone] Main  CONTACT INFORMATION: Weekday (9AM-5PM): Call [facility] main office at [phone] Weeknight (5PM-9AM) or Weekend/Holiday: Check EPIC "Web Links" tab & use "AMION" (password " [email or web address]") for General Surgery [facility] coverage  Please, DO NOT use SecureChat  (it is not reliable communication to reach operating surgeons & will lead to a delay in care).   Epic staff messaging available for outptient concerns needing 1-2 business day response.       03/03/2023  3:33 PM

## 2023-03-03 NOTE — ED Notes (Signed)
Transported to ct 

## 2023-03-03 NOTE — ED Triage Notes (Signed)
Pt bib spouse, c/o n/v/d starting yesterday. Endorses "coffee ground" stool and emesis. Pt on thinners

## 2023-03-03 NOTE — Consult Note (Signed)
NAME:  Alan Willis, MRN:  [id number], DOB:  [birth year], LOS: 0 ADMISSION DATE:  03/03/2023, CONSULTATION DATE:  03/03/23 REFERRING MD:  Robb Matar, CHIEF COMPLAINT:  hematemesis   History of Present Illness:  77 year old man w/ hx of afib, AVR on warfarin p/w anorexia, abd pain followed by hematemesis.  Workup has revealed SBO.  Has been in Afib vs flutter with borderline Bps.  PCCM consulted to assist with management.  Overall patient feels tired but better than admit.  Some ongoing abd pain and mild SOB.  Pertinent  Medical History   Past Medical History:  Diagnosis Date   A-fib [facility])    Atrial flutter with rapid ventricular response (HCC) 03/16/2021   Cholelithiasis 2011   Chronic anticoagulation 03/03/2023   Colon, diverticulosis    H/O heart valve replacement with mechanical valve 03/03/2023   Bjork-Shiley tilting disc valve in 1983 at [facility] in [place], [place].        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   Stricture of sigmoid colon s/p colectomy 2007 2007     Significant Hospital Events: Including procedures, antibiotic start and stop dates in addition to other pertinent events   11/17 admit; CCS/GI/Cards/PCCM consulted  Interim History / Subjective:  Consult  Objective   Blood pressure 97/66, pulse (!) 137, temperature 98 F (36.7 C), temperature source Oral, resp. rate (!) 23, height 5\' 10"  (1.778 m), weight 79.3 kg, SpO2 95%.        Intake/Output Summary (Last 24 hours) at 03/03/2023 1545 Last data filed at 03/03/2023 1300 Gross per 24 hour  Intake 843.55 ml  Output 300 ml  Net 543.55 ml   Filed Weights   03/03/23 0813 03/03/23 1320  Weight: 79.4 kg 79.3 kg    Examination: General: no distress, non toxic appearing HENT: NGT in place with some bloody output, MM dry Lungs: clear, no wheezing Cardiovascular: tachy, regular, aflutter on monitor occasionally switching to sinus; bedside echo with LVH and EF down  in stress type pattern; RV looks okay Abdomen: mild ttp, absent BS Extremities: no edema Neuro: moves to command, slightly hard of hearing Skin: no rashes  Sodium is down, chloride down,  has AKI (baseline Cr 1.2) INR 2.9 WBC up, Hgb up  Resolved Hospital Problem list   N/A  Assessment & Plan:  Small bowel obstruction SIRS-type response with Afib/flutter w/ RVR, relative hypotension, hyponatremia, AKI, stress cardiomyopathy, delirium: all related to dehydration and SBO; no real signs of infection at this time; abd is not acute; suspect remains but volume down and with impared glycocalyx integrity UGIB- likely related to obstruction, do not suspect he is in hemorrhagic shock at this time Hx AVR, memory issues, seizures  - Continue crystalloid and albumin administration - 2g mag - Allowing a tikosyn washout before we use any amio; cards to eval - Would hold warfarin for now, continue PPI - Can use pressors if we really need to; think with additional resuscitation BP may perk up - CCS following  Best Practice (right click and "Reselect all SmartList Selections" daily)  Per primary  Labs   CBC: Recent Labs  Lab 03/03/23 0843 03/03/23 1404  WBC 21.7*  --   NEUTROABS 18.6*  --   HGB 16.2 14.3  HCT 45.9 40.9  MCV 87.1  --   PLT 181  --     Basic Metabolic Panel: Recent Labs  Lab 03/03/23 0843 03/03/23 1404  NA 127* 126*  K  4.3 4.4  CL 82* 84*  CO2 31 28  GLUCOSE 136* 146*  BUN 42* 50*  CREATININE 1.81* 1.76*  CALCIUM 9.6 9.0  MG  --  2.0  PHOS  --  4.0   GFR: Estimated Creatinine Clearance: 36.9 mL/min (A) (by C-G formula based on SCr of 1.76 mg/dL (H)). Recent Labs  Lab 03/03/23 0843  WBC 21.7*    Liver Function Tests: Recent Labs  Lab 03/03/23 0843  AST 26  ALT 13  ALKPHOS 52  BILITOT 2.5*  PROT 8.0  ALBUMIN 4.8   Recent Labs  Lab 03/03/23 1048  LIPASE <10*   No results for input(s): "AMMONIA" in the last 168 hours.  ABG No results  found for: "PHART", "PCO2ART", "PO2ART", "HCO3", "TCO2", "ACIDBASEDEF", "O2SAT"   Coagulation Profile: Recent Labs  Lab 03/03/23 0843  INR 2.9*    Cardiac Enzymes: No results for input(s): "CKTOTAL", "CKMB", "CKMBINDEX", "TROPONINI" in the last 168 hours.  HbA1C: Hgb A1c MFr Bld  Date/Time Value Ref Range Status  12/28/2009 05:42 PM  <5.7 % Final   5.4 (NOTE)                                                                       According to the ADA Clinical Practice Recommendations for 2011, when HbA1c is used as a screening test:   >=6.5%   Diagnostic of Diabetes Mellitus           (if abnormal result  is confirmed)  5.7-6.4%   Increased risk of developing Diabetes Mellitus  References:Diagnosis and Classification of Diabetes Mellitus,Diabetes Care,2011,34(Suppl 1):S62-S69 and Standards of Medical Care in         Diabetes - 2011,Diabetes Care,2011,34  (Suppl 1):S11-S61.    CBG: No results for input(s): "GLUCAP" in the last 168 hours.  Review of Systems:    Positive Symptoms in bold:  Constitutional fevers, chills, weight loss, fatigue, anorexia, malaise  Eyes decreased vision, double vision, eye irritation  Ears, Nose, Mouth, Throat sore throat, trouble swallowing, sinus congestion  Cardiovascular chest pain, paroxysmal nocturnal dyspnea, lower ext edema, palpitations   Respiratory SOB, cough, DOE, hemoptysis, wheezing  Gastrointestinal nausea, vomiting, diarrhea  Genitourinary burning with urination, trouble urinating  Musculoskeletal joint aches, joint swelling, back pain  Integumentary  rashes, skin lesions  Neurological focal weakness, focal numbness, trouble speaking, headaches  Psychiatric depression, anxiety, confusion  Endocrine polyuria, polydipsia, cold intolerance, heat intolerance  Hematologic abnormal bruising, abnormal bleeding, unexplained nose bleeds  Allergic/Immunologic recurrent infections, hives, swollen lymph nodes     Past Medical History:  He,   has a past medical history of A-fib (HCC), Atrial flutter with rapid ventricular response (HCC) (03/16/2021), Cholelithiasis (2011), Chronic anticoagulation (03/03/2023), Colon, diverticulosis, H/O heart valve replacement with mechanical valve (03/03/2023), History of seizures (03/03/2023), Memory loss (03/03/2023), and Stricture of sigmoid colon s/p colectomy 2007 (2007).   Surgical History:   Past Surgical History:  Procedure Laterality Date   ATRIAL FIBRILLATION ABLATION     COLON RESECTION SIGMOID  07/26/2005   Dr Gerrit Friends for diverticular sigmoid colon stricture   LAPAROSCOPIC INGUINAL HERNIA REPAIR Bilateral 04/03/1999   Dr Lowella Curb   VALVE REPLACEMENT       Social History:   reports that he  has been smoking cigarettes and cigars. He has never used smokeless tobacco. He reports that he does not currently use alcohol. He reports that he does not use drugs.   Family History:  His family history is not on file.   Allergies Allergies  Allergen Reactions   Amoxicillin-Pot Clavulanate Rash   Codeine Other (See Comments)    Stomach upset     Home Medications  Prior to Admission medications   Medication Sig Start Date End Date Taking? Authorizing Provider  Cholecalciferol (VITAMIN D3) 1000 units CAPS Take 1,000 Units by mouth daily.   Yes [provider]  dofetilide (TIKOSYN) 250 MCG capsule Take 250 mcg by mouth every 12 (twelve) hours. 10/25/21  Yes [provider]  doxycycline (VIBRA-TABS) 100 MG tablet Take 100 mg by mouth See admin instructions. Take 100 mg by mouth 30-60 minutes prior to dental procedures 05/25/22  Yes [provider]  ferrous sulfate 325 (65 FE) MG tablet Take 325 mg by mouth daily with breakfast.   Yes [provider]  furosemide (LASIX) 40 MG tablet Take 40 mg by mouth in the morning. 09/05/21  Yes [provider]  IMODIUM A-D 2 MG tablet Take 2 mg by mouth 4 (four) times daily as needed for diarrhea or loose  stools.   Yes [provider]  levETIRAcetam (KEPPRA) 500 MG tablet Take 1 tablet in AM and 1 and 1/2 tablets in PM Patient taking differently: Take 500-750 mg by mouth See admin instructions. Take 500 mg by mouth in the morning and 750 mg in the evening 12/31/22  Yes Van Clines, MD  magnesium oxide (MAG-OX) 400 (240 Mg) MG tablet Take 400 mg by mouth daily.   Yes [provider]  spironolactone (ALDACTONE) 25 MG tablet Take 12.5 mg by mouth in the morning. 10/24/21  Yes [provider]  TYLENOL 500 MG tablet Take 500 mg by mouth every 6 (six) hours as needed for mild pain (pain score 1-3) or headache.   Yes [provider]  warfarin (COUMADIN) 6 MG tablet Take 3-6 mg by mouth See admin instructions. Take 6 mg by mouth in the morning on Sun/Mon/Wed/Thurs/Fri/Sat and 3 mg on Tues   Yes [provider]     Critical care time: N/A

## 2023-03-03 NOTE — Plan of Care (Signed)
Discussed with patient in front of sons plan of care for the evening, pain management and blood pressure related medications with some teach back from the son's.  What is important is to evaluate his urinary retention, attend to his hypotension and replace his NG tube which he had pulled out.    Primary AM Dr. Robb Matar and E-link PM Dr. Marlane Mingle and Dr. Gaynell Face (in-person) have assesses the patient with new orders being initiated.   Problem: Education: Goal: Knowledge of General Education information will improve Description: Including pain rating scale, medication(s)/side effects and non-pharmacologic comfort measures Outcome: Not Progressing   Problem: Coping: Goal: Level of anxiety will decrease Outcome: Not Progressing   Problem: Elimination: Goal: Will not experience complications related to urinary retention Outcome: Not Progressing   Problem: Pain Management: Goal: General experience of comfort will improve Outcome: Progressing

## 2023-03-03 NOTE — ED Notes (Signed)
Chaperone for EDP for rectal exam. Pt tolerated well 

## 2023-03-03 NOTE — ED Notes (Signed)
[place] with  cl called for transport 

## 2023-03-03 NOTE — Progress Notes (Addendum)
eLink Physician-Brief Progress Note Patient Name: Alan Willis DOB: December 07, 1945 MRN: [id number]   Date of Service  03/03/2023  HPI/Events of Note  Elert. Camera: S/p 5 lit, SBP dipping < 90 and MAP also per RN discussion.  Family at bed side.  77 year old man w/ hx of afib, AVR on warfarin p/w anorexia, abd pain followed by hematemesis. Workup has revealed SBO. Has been in Afib vs flutter with borderline Bps( Cardiology said observe for now), Surgery notes reviewed   eICU Interventions  Start Neo synephrine.has US guided 20 G PIV. Avoid extravasation. MAP improved to 70.   Follow stat CBC ( last Hg > 14, could be spuriously elevated from dehydration). BMP, VBG, LA. - transfuse if HG dropping. GI is aware. - asp precautions, has NG tube - GCS ok, so far able to protect airways. - on levoquin, SCD, PPI.        Intervention Category Major Interventions: Shock - evaluation and management Minor Interventions: Communication with other healthcare providers and/or family  Ranee Gosselin 03/03/2023, 7:12 PM  20:09 Labs reviewed Metabolic alkalosis pH 7.57, some resp acidosis component also. Sodium stable at 126. Cr improving. INR at 2  Hg dropped to 11.6.   - follow Hg closely.  Transfuse if having active bleeding or further rapid dropping in Hg.ordered Hg for midnight once.  On 35 of Neo. Lungs sound ok per RN discussion. Pulled out NG, need to re insert. LA up at 2, LR 500 ml once instead of 1 lit. Type and screen is ready, if need urgent transfusion. Due to IV fluid shortage, not getting Protonix drip, instead on q12 hrly Protonix .   02:41 HR better at 80's.  Has had 2 out of the 3 units of FFP ordered, and getting more Albumin after the 3rd one done.  - continue care.

## 2023-03-03 NOTE — H&P (Signed)
History and Physical    Patient: Alan Willis JXB:[id number] DOB: 1945-12-07 DOA: 03/03/2023 DOS: the patient was seen and examined on 03/03/2023 PCP: [name], MD  Patient coming from: Home  Chief Complaint:  Chief Complaint  Patient presents with   Emesis   HPI: NEKO KOMISAR is a 77 y.o. male with medical history significant of atrial flutter/history of ablation, paroxysmal atrial fibrillation on Tikosyn, aortic valve replacement on warfarin, mitral valve stenosis, rheumatic mitral valve regurgitation, abdominal aortic atherosclerosis, history of seizures, stage II CKD, diverticulosis, cholelithiasis, right inguinal hernia who presented to the emergency department [facility] due to abdominal pain since yesterday morning associated with abdominal distention, multiple episodes of nausea/emesis subsequently turning into coffee-ground hematemesis, diarrhea with melena.  He has not being able to eat since Friday.  He tried to take his medications yesterday morning, but threw them up. He denied fever, chills, rhinorrhea, sore throat, wheezing or hemoptysis. No chest pain, palpitations, diaphoresis, PND, orthopnea or pitting edema of the lower extremities. No flank pain, dysuria, frequency or hematuria.  No polyuria, polydipsia, polyphagia or blurred vision.   Lab work: CBC showed white count 21.7, hemoglobin 16.2 g/dL and platelets 130.  PT 30.3 and INR 2.9.  Fecal occult blood was positive.  Troponin was 91 ng/L.  Lipase is unremarkable.  CMP showed a 727, potassium 4.3, chloride 82 and CO2 31 mmol/L.  Glucose 136, BUN 42, creatinine 1.81 and total bilirubin 2.5 mg/dL.  The rest of the LFTs and electrolytes were normal.  Repeat H&H was 14.3/40.9.  Magnesium 2.0 and phosphorus 4.0 mg/dL.  Imaging: CT abdomen/pelvis without contrast showing a small bowel obstruction with abrupt transition to the decompressed bowel anteriorly within the central abdomen, possibly secondary  to adhesions.  Markedly dilated gallbladder with a 1.7 cm stone in the region of the gallbladder neck.  No pericholecystic inflammatory changes by CT.  Right inguinal hernia containing fat and distal small bowel.  Colonic diverticulosis.  Diverticulitis.  Aortic atherosclerosis.  ED course: Initial vital signs were temperature 98 F, pulse 91, respirations 20, BP 111/79 mmHg O2 sat 93% on room air.  The patient received pantoprazole 40 mg IVP, ondansetron 4 mg IVP, LR 1000 mm bolus x 2 and Keppra 500 mg IVPB.  Upon arrival to the stepdown unit, the patient was tachycardic with a monomorphic wide-complex QRS and hypotension.  Case discussed with cardiology on-call who thinks this is atrial flutter. Cardiology only recommended supportive care and no antiarrhythmics until 48 hours from Saturday morning which is the last time he tried to take his Tikosyn.  PCCM consulted.  He received 2 more liters of IV fluids, briefly converted back to sinus rhythm for about 20 to 30 minutes, but is back into the previous rhythm in the 120s/130s.  His blood pressure though has improved.   Review of Systems: As mentioned in the history of present illness. All other systems reviewed and are negative. Past Medical History:  Diagnosis Date   A-fib [facility])    Atrial flutter with rapid ventricular response (HCC) 03/16/2021   Chronic anticoagulation 03/03/2023   History of aortic valve replacement 05/21/2011   Past Surgical History:  Procedure Laterality Date   ATRIAL FIBRILLATION ABLATION     COLON RESECTION SIGMOID  07/26/2005   Dr Gerrit Friends for diverticular sigmoid colon stricture   LAPAROSCOPIC INGUINAL HERNIA REPAIR Bilateral 04/03/1999   Dr Lowella Curb   VALVE REPLACEMENT     Social History:  reports that he has been smoking  cigarettes and cigars. He has never used smokeless tobacco. He reports that he does not currently use alcohol. He reports that he does not use drugs.  Allergies  Allergen Reactions    Amoxicillin-Pot Clavulanate Other (See Comments)    Other reaction(s): rash   Codeine     History reviewed. No pertinent family history.  Prior to Admission medications   Medication Sig Start Date End Date Taking? Authorizing Provider  dofetilide (TIKOSYN) 250 MCG capsule Take 250 mcg by mouth every 12 (twelve) hours. 10/25/21   [provider]  doxycycline (VIBRA-TABS) 100 MG tablet 1 tablet Orally 30-60 minutes prior to procedure 05/25/22   [provider]  furosemide (LASIX) 40 MG tablet 1 tablet 09/05/21   [provider]  levETIRAcetam (KEPPRA) 500 MG tablet Take 1 tablet in AM and 1 and 1/2 tablets in PM 12/31/22   Van Clines, MD  spironolactone (ALDACTONE) 25 MG tablet Take 12.5 mg by mouth daily. 10/24/21   [provider]  warfarin (COUMADIN) 6 MG tablet Take 6 mg by mouth See admin instructions. Sunday,Monday,Wednesday,Friday-6 mg Tuesday,Thursday,Saturday-3 mg    [provider]    Physical Exam: Vitals:   03/03/23 0900 03/03/23 0915 03/03/23 0930 03/03/23 1000  BP: 109/76  113/75 122/80  Pulse: 78 77 74 66  Resp: (!) 22 19 (!) 22 18  Temp:      TempSrc:      SpO2: 96% 98% 98% 98%  Weight:       Physical Exam Vitals and nursing note reviewed.  Constitutional:      Appearance: Normal appearance. He is ill-appearing.  HENT:     Head: Normocephalic.     Nose: No rhinorrhea.     Mouth/Throat:     Mouth: Mucous membranes are moist.  Eyes:     General: No scleral icterus.    Pupils: Pupils are equal, round, and reactive to light.  Neck:     Vascular: No JVD.  Cardiovascular:     Rate and Rhythm: Regular rhythm. Tachycardia present.     Pulses: Normal pulses.     Heart sounds: Normal heart sounds, S1 normal and S2 normal.  Pulmonary:     Effort: Pulmonary effort is normal.     Breath sounds: Normal breath sounds. No wheezing, rhonchi or rales.  Abdominal:     General: Bowel sounds are absent. There is distension.      Palpations: Abdomen is soft.     Tenderness: There is abdominal tenderness in the epigastric area. There is no right CVA tenderness, left CVA tenderness, guarding or rebound.  Musculoskeletal:     Cervical back: Neck supple.     Right lower leg: No edema.     Left lower leg: No edema.  Skin:    General: Skin is warm and dry.  Neurological:     General: No focal deficit present.     Mental Status: He is alert. Mental status is at baseline.  Psychiatric:        Mood and Affect: Mood normal.        Behavior: Behavior normal.    Data Reviewed:   Results are pending, will review when available.  EKG at [facility]: Vent. rate 82 BPM PR interval 251 ms QRS duration 126 ms QT/QTcB 402/470 ms P-R-T axes 48 -50 17 Sinus rhythm Prolonged PR interval Probable left atrial enlargement Nonspecific IVCD with LAD Left ventricular hypertrophy ST elevation, consider anterior injury Baseline wander in lead(s) V1 V2  EKG  at [facility]: Vent. rate 136 BPM PR interval * ms QRS duration 120 ms QT/QTcB 336/505 ms P-R-T axes * -40 83 Wide QRS tachycardia with Fusion complexes Left axis deviation Non-specific intra-ventricular conduction delay Minimal voltage criteria for LVH, may be normal variant ( Cornell product ) Abnormal ECG  Assessment and Plan: Principal Problem:   GI bleed Admit to stepdown/inpatient. Keep NPO for now. Continue IV fluids. Continue pantoprazole 40 mg IVP twice daily.. Monitor H&H. Transfuse as needed. Discussed with GI.   Currently has SBO/no procedures planned for now. Official consult to follow.  Active Problems:   SBO (small bowel obstruction) (HCC)  Discussed with general surgery. Keep NPO. Continue NTG at [facility]. Continue IV fluids. Antiemetics as needed. Keep electrolytes optimized. Follow-up CBC and CMP in AM. Follow-up imaging in the morning. General surgery input appreciated. No significant pain at the moment. Avoid narcotics if hypotensive.    Recurrent  right inguinal hernia No signs of a strangulation.    Gallstone Follow-up LFTs. Consider RUQ ultrasound.    AKI (acute kidney injury) (HCC) Superimposed on:   CKD (chronic kidney disease) stage 2, GFR 60-89 ml/min Continue IV fluids. Avoid hypotension. Avoid nephrotoxins. Monitor intake and output. Monitor renal function/electrolytes.    History of aortic valve replacement -  Aortic stenosis s/p SAVR (1983 Mallie Mussel) Requiring high intensity:   Chronic anticoagulation Holding warfarin at the moment. Daily PT/INR.    Atrial fibrillation with RVR [facility]) Discussed with cardiology. Supportive care only. No antiarrhythmics until 48 hours from Saturday morning.    Elevated troponin Likely demand ischemia. Will continue to monitor.    Memory loss Supportive care.    History of seizures Continue levetiracetam parenterally. Discussed with pharmacy.     Advance Care Planning:   Code Status: Full Code   Consults: PCCM Levon Hedger, MD), [facility] Sprang GI Willis Modena, MD). [place] [facility] Karie Soda, MD), cardiology Abran Richard, MD)  Family Communication: I discussed the case with his wife and 2 sons who were at bedside.  Severity of Illness: The appropriate patient status for this patient is INPATIENT. Inpatient status is judged to be reasonable and necessary in order to provide the required intensity of service to ensure the patient's safety. The patient's presenting symptoms, physical exam findings, and initial radiographic and laboratory data in the context of their chronic comorbidities is felt to place them at high risk for further clinical deterioration. Furthermore, it is not anticipated that the patient will be medically stable for discharge from the hospital within 2 midnights of admission.   * I certify that at the point of admission it is my clinical judgment that the patient will require inpatient hospital care spanning beyond 2 midnights from the  point of admission due to high intensity of service, high risk for further deterioration and high frequency of surveillance required.*  Author: Bobette Mo, MD 03/03/2023 1:34 PM  For on call review [email or web address].   This document was prepared using Dragon voice recognition software and may contain some unintended transcription errors.

## 2023-03-03 NOTE — ED Notes (Signed)
Pts wife has his cellphone and wallet. Pts son is at bedside and is taking pt's clothes.

## 2023-03-03 NOTE — ED Provider Notes (Signed)
[place] EMERGENCY DEPARTMENT AT [facility] Provider Note   CSN: [id number] Arrival date & time: 03/03/23  0757   History  Chief Complaint  Patient presents with   Emesis    Alan Willis is a 77 y.o. male.  77 year old male brought in by spouse with concerns of nausea/vomiting/diarrhea which began yesterday in addition to coffee ground stool and emesis.  Of note, patient takes warfarin as he has had a valve replacement.  Yesterday morning, he had abdominal pain accompanied with many episodes of dark emesis although denies frank blood.  He was vomiting so frequently, he was unable to keep down any of his medications.  Further endorses diarrhea which is normal colored, denies darkening or frank blood.  Denies chest pain/pressure, shortness of breath, abdominal pain.   Emesis      Home Medications Prior to Admission medications   Medication Sig Start Date End Date Taking? Authorizing Provider  dofetilide (TIKOSYN) 250 MCG capsule Take 250 mcg by mouth every 12 (twelve) hours. 10/25/21   [provider]  doxycycline (VIBRA-TABS) 100 MG tablet 1 tablet Orally 30-60 minutes prior to procedure 05/25/22   [provider]  furosemide (LASIX) 40 MG tablet 1 tablet 09/05/21   [provider]  levETIRAcetam (KEPPRA) 500 MG tablet Take 1 tablet in AM and 1 and 1/2 tablets in PM 12/31/22   Van Clines, MD  spironolactone (ALDACTONE) 25 MG tablet Take 12.5 mg by mouth daily. 10/24/21   [provider]  warfarin (COUMADIN) 6 MG tablet Take 6 mg by mouth See admin instructions. Sunday,Monday,Wednesday,Friday-6 mg Tuesday,Thursday,Saturday-3 mg    [provider]      Allergies    Amoxicillin-pot clavulanate and Codeine    Review of Systems   Review of Systems  Gastrointestinal:  Positive for vomiting.   Physical Exam Updated Vital Signs BP 122/80   Pulse 66   Temp 98 F (36.7 C) (Oral)   Resp 18   Wt 79.4 kg   SpO2 98%    BMI 25.11 kg/m  Physical Exam Constitutional:      General: He is not in acute distress.    Appearance: He is ill-appearing.  Cardiovascular:     Rate and Rhythm: Normal rate and regular rhythm.     Heart sounds: Murmur heard.  Pulmonary:     Breath sounds: Normal breath sounds.  Abdominal:     General: Abdomen is flat. Bowel sounds are normal.     Palpations: Abdomen is soft.    ED Results / Procedures / Treatments   Labs (all labs ordered are listed, but only abnormal results are displayed) Labs Reviewed  COMPREHENSIVE METABOLIC PANEL - Abnormal; Notable for the following components:      Result Value   Sodium 127 (*)    Chloride 82 (*)    Glucose, Bld 136 (*)    BUN 42 (*)    Creatinine, Ser 1.81 (*)    Total Bilirubin 2.5 (*)    GFR, Estimated 38 (*)    All other components within normal limits  CBC WITH DIFFERENTIAL/PLATELET - Abnormal; Notable for the following components:   WBC 21.7 (*)    Neutro Abs 18.6 (*)    Monocytes Absolute 1.7 (*)    Abs Immature Granulocytes 0.16 (*)    All other components within normal limits  OCCULT BLOOD X 1 CARD TO LAB, STOOL - Abnormal; Notable for the following components:   Fecal Occult Bld POSITIVE (*)  All other components within normal limits  PROTIME-INR - Abnormal; Notable for the following components:   Prothrombin Time 30.3 (*)    INR 2.9 (*)    All other components within normal limits  LIPASE, BLOOD - Abnormal; Notable for the following components:   Lipase <10 (*)    All other components within normal limits  TROPONIN I (HIGH SENSITIVITY) - Abnormal; Notable for the following components:   Troponin I (High Sensitivity) 104 (*)    All other components within normal limits  TROPONIN I (HIGH SENSITIVITY) - Abnormal; Notable for the following components:   Troponin I (High Sensitivity) 91 (*)    All other components within normal limits  CBG MONITORING, ED    EKG EKG Interpretation Date/Time:  Sunday March 03 2023 08:20:02 EST Ventricular Rate:  82 PR Interval:  251 QRS Duration:  126 QT Interval:  402 QTC Calculation: 470 R Axis:   -50  Text Interpretation: Sinus rhythm Prolonged PR interval Probable left atrial enlargement Nonspecific IVCD with LAD Left ventricular hypertrophy ST elevation, consider anterior injury Baseline wander in lead(s) V1 V2 Confirmed by Lorre Nick ([id number]) on 03/03/2023 8:31:53 AM  Radiology CT ABDOMEN PELVIS WO CONTRAST  Result Date: 03/03/2023 CLINICAL DATA:  Abdominal pain, acute, nonlocalized EXAM: CT ABDOMEN AND PELVIS WITHOUT CONTRAST TECHNIQUE: Multidetector CT imaging of the abdomen and pelvis was performed following the standard protocol without IV contrast. RADIATION DOSE REDUCTION: This exam was performed according to the departmental dose-optimization program which includes automated exposure control, adjustment of the mA and/or kV according to patient size and/or use of iterative reconstruction technique. COMPARISON:  04/14/2020 FINDINGS: Lower chest: Lung bases are clear.  Coronary artery atherosclerosis. Hepatobiliary: No focal liver abnormality identified on noncontrast images. Markedly dilated gallbladder. 1.7 cm stone in the region of the gallbladder neck. No pericholecystic inflammatory changes by CT. No intrahepatic biliary dilatation. Pancreas: Unremarkable. No pancreatic ductal dilatation or surrounding inflammatory changes. Spleen: Normal in size without focal abnormality. Adrenals/Urinary Tract: Unremarkable adrenal glands. Bilateral renal cysts, which require no follow-up imaging. Kidneys otherwise unremarkable. No stone or hydronephrosis. Urinary bladder within normal limits. Stomach/Bowel: Fluid within the distal esophagus. Moderately dilated stomach. Multiple fluid-filled, dilated loops of small bowel throughout the abdomen with abrupt transition to decompressed bowel anteriorly within the central abdomen (series 2, image 54). There is fecalization of  small bowel content immediately upstream to the transition site. Distal small bowel is decompressed. A small loop of distal small bowel extends into patient's right inguinal hernia. Normal appendix in the right lower quadrant. Colonic diverticulosis. No colonic wall thickening or inflammatory changes. Vascular/Lymphatic: Extensive aortoiliac atherosclerosis. No lymphadenopathy. Reproductive: Prostate gland within normal limits. Other: No ascites or pneumoperitoneum. Right inguinal hernia containing fat and distal small bowel. Musculoskeletal: No acute or significant osseous findings. IMPRESSION: 1. Small-bowel obstruction with abrupt transition to decompressed bowel anteriorly within the central abdomen, possibly secondary to adhesions. 2. Markedly dilated gallbladder with 1.7 cm stone in the region of the gallbladder neck. No pericholecystic inflammatory changes by CT. Correlate clinically for acute cholecystitis. 3. Right inguinal hernia containing fat and distal small bowel. 4. Colonic diverticulosis without evidence of acute diverticulitis. 5. Aortic atherosclerosis (ICD10-I70.0). These results were called by telephone at the time of interpretation on 03/03/2023 at 10:26 am to provider Lorre Nick , who verbally acknowledged these results. Electronically Signed   By: Duanne Guess D.O.   On: 03/03/2023 10:27    Procedures Procedures    Medications Ordered in ED Medications  lidocaine (XYLOCAINE) 2 % jelly 1 Application (has no administration in time range)  levETIRAcetam (KEPPRA) IVPB 500 mg/100 mL premix (500 mg Intravenous New Bag/Given 03/03/23 1205)  pantoprazole (PROTONIX) injection 40 mg (40 mg Intravenous Given 03/03/23 0910)  lactated ringers bolus 1,000 mL ( Intravenous Stopped 03/03/23 1022)  ondansetron (ZOFRAN) injection 4 mg (4 mg Intravenous Given 03/03/23 1016)    ED Course/ Medical Decision Making/ A&P                                 Medical Decision Making 77 year old  male presenting with nausea/vomiting/diarrhea and coffee ground stool and emesis x 1 day and inability to take his medications.  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  Differential diagnosis with raise concern of upper GI bleed but may also consider ACS, lower GI bleed, anemia, SBO, diverticulitis, viral gastroenteritis, AKI.  Reassuringly normal physical exam however he does continue to have coffee-ground emesis and normal to low blood pressure.  Initiated 1 L LR bolus, IV Protonix, Zofran given primary concern of upper GI bleed.EKG did not show considerable change from prior with the exception of ST elevation in V2.  CBC with leukocytosis of WBC 21.7 and ANC 18.6.  CMP shows AKI with creatinine 1.81 and BUN 42 likely secondary to GI bleed and hypovolemia secondary to emesis.  His INR is in appropriate range at 2.9.  Troponin returned at 104 with second troponin 91 likely representing demand ischemia.  Lipase <10.  FOBT positive, further investigated with CT abdomen/pelvis.  This showed SBO with abrupt transition to decompressed bowel anteriorly within the central abdomen possibly secondary to adhesions.  Additionally there is a markedly dilated gallbladder with 1.7 cm stone however no pericholecystic inflammatory changes for I am not as much concerned of cholecystitis.  Discussed case with general surgery who advised no surgical intervention at this time given he is having diarrhea which represents incomplete bowel obstruction.  Consulted GI who recommends surgery consult and IV PPI.  They will not proceed with endoscopy at this time given coffee-ground emesis.  Consulted hospitalist for admission regarding SBO and coffee-ground emesis with further recommendations by GI and general surgery.  Provide Keppra given he has been unable to take his home dosing.  He will be admitted to hospital by Dr. Robb Matar.        Final Clinical Impression(s) / ED Diagnoses Final  diagnoses:  Small bowel obstruction (HCC)  AKI (acute kidney injury) [facility])   Rx / DC Orders ED Discharge Orders     None        Shelby Mattocks, DO 03/03/23 1217    Lorre Nick, MD 03/05/23 1114

## 2023-03-03 NOTE — Plan of Care (Signed)
Patient not progressing. Unstable assessment and vitals.

## 2023-03-03 NOTE — Progress Notes (Signed)
Plan of Care Note for accepted transfer   Patient: Alan Willis MRN: [id number]   DOA: 03/03/2023  Facility requesting transfer: [facility] ED. Requesting Provider: Dr. Freida Busman and Dr. Royal Piedra. Reason for transfer: GI bleed, SBO, AKI. 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  The case has already been discussed with general surgery and gastroenterology.  Started on Protonix, after receiving LR 1000 mm bolus, ondansetron 4 mg IVP and also received Keppra since the patient has a history of seizures.  Lab work: Troponin I (High Sensitivity) [id number] (Abnormal)   Collected: 03/03/23 1048   Updated: 03/03/23 1127    Troponin I (High Sensitivity) 91 High  ng/L  Lipase, blood [[id number]] (Abnormal)   Collected: 03/03/23 1048   Updated: 03/03/23 1126   Specimen Type: Blood    Lipase <10 Low  U/L  Occult blood card to lab, stool [[id number]] (Abnormal)   Collected: 03/03/23 0843   Updated: 03/03/23 1002   Specimen Type: Stool    Fecal Occult Bld POSITIVE Abnormal   Troponin I (High Sensitivity) [[id number]] (Abnormal)   Collected: 03/03/23 0843   Updated: 03/03/23 0924   Specimen Source: Vein    Troponin I (High Sensitivity) 104 High Panic  ng/L  Protime-INR [[id number]] (Abnormal)   Collected: 03/03/23 0843   Updated: 03/03/23 0921   Specimen Type: Blood   Specimen Source: Vein    Prothrombin Time 30.3 High  seconds   INR 2.9 High   Comprehensive metabolic panel [[id number]] (Abnormal)   Collected: 03/03/23 0843   Updated: 03/03/23 0919   Specimen Type: Blood   Specimen Source: Vein    Sodium 127 Low  mmol/L   Potassium 4.3 mmol/L    Chloride 82 Low  mmol/L   CO2 31 mmol/L   Glucose, Bld 136 High  mg/dL   BUN 42 High  mg/dL   Creatinine, Ser 5.63 High  mg/dL   Calcium 9.6 mg/dL   Total Protein 8.0 g/dL   Albumin 4.8 g/dL   AST 26 U/L   ALT 13 U/L   Alkaline Phosphatase 52 U/L   Total Bilirubin 2.5 High  mg/dL   GFR, Estimated 38 Low  mL/min   Anion gap 14  CBC with Differential [[id number]] (Abnormal)   Collected: 03/03/23 0843   Updated: 03/03/23 0851   Specimen Type: Blood   Specimen Source: Vein    WBC 21.7 High  K/uL   RBC 5.27 MIL/uL   Hemoglobin 16.2 g/dL   HCT 51.8 %   MCV 84.1 fL   MCH 30.7 pg   MCHC 35.3 g/dL   RDW 66.0 %   Platelets 181 K/uL   nRBC 0.0 %   Neutrophils Relative % 85 %   Neutro Abs 18.6 High  K/uL   Lymphocytes Relative 6 %   Lymphs Abs 1.2 K/uL   Monocytes Relative 8 %   Monocytes Absolute 1.7 High  K/uL   Eosinophils Relative 0 %   Eosinophils Absolute 0.0 K/uL   Basophils Relative 0 %   Basophils Absolute 0.0 K/uL   Immature Granulocytes 1 %   Abs Immature Granulocytes 0.16 High  K/uL   Imaging: EXAM: CT ABDOMEN AND PELVIS WITHOUT CONTRAST   TECHNIQUE: Multidetector CT imaging of the abdomen  and pelvis was performed following the standard protocol without IV contrast.   RADIATION DOSE REDUCTION: This exam was performed according to the departmental dose-optimization program which includes automated exposure control, adjustment of the mA and/or kV according to patient size and/or use of iterative reconstruction technique.   COMPARISON:  04/14/2020   FINDINGS: Lower chest: Lung bases are clear.  Coronary artery atherosclerosis.   Hepatobiliary: No focal liver abnormality identified on noncontrast images. Markedly dilated gallbladder. 1.7 cm stone in the region of the gallbladder neck. No pericholecystic inflammatory changes by CT. No intrahepatic biliary dilatation.   Pancreas: Unremarkable. No pancreatic ductal dilatation or surrounding inflammatory  changes.   Spleen: Normal in size without focal abnormality.   Adrenals/Urinary Tract: Unremarkable adrenal glands. Bilateral renal cysts, which require no follow-up imaging. Kidneys otherwise unremarkable. No stone or hydronephrosis. Urinary bladder within normal limits.   Stomach/Bowel: Fluid within the distal esophagus. Moderately dilated stomach. Multiple fluid-filled, dilated loops of small bowel throughout the abdomen with abrupt transition to decompressed bowel anteriorly within the central abdomen (series 2, image 54). There is fecalization of small bowel content immediately upstream to the transition site. Distal small bowel is decompressed. A small loop of distal small bowel extends into patient's right inguinal hernia. Normal appendix in the right lower quadrant. Colonic diverticulosis. No colonic wall thickening or inflammatory changes.   Vascular/Lymphatic: Extensive aortoiliac atherosclerosis. No lymphadenopathy.   Reproductive: Prostate gland within normal limits.   Other: No ascites or pneumoperitoneum. Right inguinal hernia containing fat and distal small bowel.   Musculoskeletal: No acute or significant osseous findings.   IMPRESSION: 1. Small-bowel obstruction with abrupt transition to decompressed bowel anteriorly within the central abdomen, possibly secondary to adhesions. 2. Markedly dilated gallbladder with 1.7 cm stone in the region of the gallbladder neck. No pericholecystic inflammatory changes by CT. Correlate clinically for acute cholecystitis. 3. Right inguinal hernia containing fat and distal small bowel. 4. Colonic diverticulosis without evidence of acute diverticulitis. 5. Aortic atherosclerosis (ICD10-I70.0).   These results were called by telephone at the time of interpretation on 03/03/2023 at 10:26 am to provider Lorre Nick , who verbally acknowledged these results.     Electronically Signed   By: Duanne Guess D.O.   On:  03/03/2023 10:27    Plan of care: The patient is accepted for admission to [facility] unit, at [facility].  Author: Bobette Mo, MD 03/03/2023  Check [email or web address] for on-call coverage.  Nursing staff, Please call [facility] Admits & Consults System-Wide number on Amion as soon as patient's arrival, so appropriate admitting provider can evaluate the pt.

## 2023-03-04 ENCOUNTER — Inpatient Hospital Stay (HOSPITAL_COMMUNITY): Payer: Medicare Other

## 2023-03-04 DIAGNOSIS — N179 Acute kidney failure, unspecified: Secondary | ICD-10-CM

## 2023-03-04 DIAGNOSIS — Z7901 Long term (current) use of anticoagulants: Secondary | ICD-10-CM | POA: Diagnosis not present

## 2023-03-04 DIAGNOSIS — Z952 Presence of prosthetic heart valve: Secondary | ICD-10-CM | POA: Diagnosis not present

## 2023-03-04 DIAGNOSIS — K56609 Unspecified intestinal obstruction, unspecified as to partial versus complete obstruction: Secondary | ICD-10-CM | POA: Diagnosis not present

## 2023-03-04 DIAGNOSIS — I4891 Unspecified atrial fibrillation: Secondary | ICD-10-CM

## 2023-03-04 LAB — COMPREHENSIVE METABOLIC PANEL
ALT: 13 U/L (ref 0–44)
AST: 26 U/L (ref 15–41)
Albumin: 3.6 g/dL (ref 3.5–5.0)
Alkaline Phosphatase: 30 U/L — ABNORMAL LOW (ref 38–126)
Anion gap: 7 (ref 5–15)
BUN: 46 mg/dL — ABNORMAL HIGH (ref 8–23)
CO2: 28 mmol/L (ref 22–32)
Calcium: 8.1 mg/dL — ABNORMAL LOW (ref 8.9–10.3)
Chloride: 90 mmol/L — ABNORMAL LOW (ref 98–111)
Creatinine, Ser: 1.55 mg/dL — ABNORMAL HIGH (ref 0.61–1.24)
GFR, Estimated: 46 mL/min — ABNORMAL LOW (ref >60)
Glucose, Bld: 151 mg/dL — ABNORMAL HIGH (ref 70–99)
Potassium: 4.4 mmol/L (ref 3.5–5.1)
Sodium: 125 mmol/L — ABNORMAL LOW (ref 135–145)
Total Bilirubin: 2.3 mg/dL — ABNORMAL HIGH (ref <1.2)
Total Protein: 5.6 g/dL — ABNORMAL LOW (ref 6.5–8.1)

## 2023-03-04 LAB — MRSA NEXT GEN BY PCR, NASAL: MRSA by PCR Next Gen: NOT DETECTED

## 2023-03-04 LAB — CBC
HCT: 23.8 % — ABNORMAL LOW (ref 39.0–52.0)
Hemoglobin: 8.5 g/dL — ABNORMAL LOW (ref 13.0–17.0)
MCH: 32.4 pg (ref 26.0–34.0)
MCHC: 35.7 g/dL (ref 30.0–36.0)
MCV: 90.8 fL (ref 80.0–100.0)
Platelets: 128 10*3/uL — ABNORMAL LOW (ref 150–400)
RBC: 2.62 MIL/uL — ABNORMAL LOW (ref 4.22–5.81)
RDW: 13.7 % (ref 11.5–15.5)
WBC: 23 10*3/uL — ABNORMAL HIGH (ref 4.0–10.5)
nRBC: 0 % (ref 0.0–0.2)

## 2023-03-04 LAB — LACTIC ACID, PLASMA
Lactic Acid, Venous: 2.3 mmol/L (ref 0.5–1.9)
Lactic Acid, Venous: 2.2 mmol/L (ref 0.5–1.9)

## 2023-03-04 LAB — PREPARE RBC (CROSSMATCH)

## 2023-03-04 LAB — PROTIME-INR
INR: 2.9 — ABNORMAL HIGH (ref 0.8–1.2)
Prothrombin Time: 30.3 s — ABNORMAL HIGH (ref 11.4–15.2)

## 2023-03-04 LAB — HEMOGLOBIN AND HEMATOCRIT, BLOOD
HCT: 20.8 % — ABNORMAL LOW (ref 39.0–52.0)
HCT: 26.8 % — ABNORMAL LOW (ref 39.0–52.0)
Hemoglobin: 7 g/dL — ABNORMAL LOW (ref 13.0–17.0)
Hemoglobin: 9.3 g/dL — ABNORMAL LOW (ref 13.0–17.0)

## 2023-03-04 LAB — PREALBUMIN: Prealbumin: 16 mg/dL — ABNORMAL LOW (ref 18–38)

## 2023-03-04 MED ORDER — SODIUM CHLORIDE 0.9% IV SOLUTION: Freq: Once | INTRAVENOUS | Status: AC

## 2023-03-04 MED ORDER — ORAL CARE MOUTH RINSE
15.0000 mL | OROMUCOSAL | Status: DC | PRN
Start: 2023-03-04 — End: 2023-03-05

## 2023-03-04 MED ORDER — PANTOPRAZOLE SODIUM 40 MG IV SOLR
40.0000 mg | Freq: Four times a day (QID) | INTRAVENOUS | Status: AC
Start: 2023-03-04 — End: 2023-03-07
  Administered 2023-03-04 – 2023-03-07 (×12): 40 mg via INTRAVENOUS
  Filled 2023-03-04 (×12): qty 10

## 2023-03-04 MED ORDER — PANTOPRAZOLE SODIUM 40 MG IV SOLR
40.0000 mg | INTRAVENOUS | Status: AC
  Administered 2023-03-04 (×2): 40 mg via INTRAVENOUS
  Filled 2023-03-04 (×2): qty 10

## 2023-03-04 MED ORDER — VITAMIN K1 10 MG/ML IJ SOLN
10.0000 mg | Freq: Once | INTRAVENOUS | Status: AC
  Administered 2023-03-04: 10 mg via INTRAVENOUS
  Filled 2023-03-04: qty 1

## 2023-03-04 MED ORDER — ORAL CARE MOUTH RINSE
15.0000 mL | OROMUCOSAL | Status: DC
  Administered 2023-03-04 – 2023-03-05 (×5): 15 mL via OROMUCOSAL

## 2023-03-04 MED ORDER — CHLORHEXIDINE GLUCONATE CLOTH 2 % EX PADS
6.0000 | MEDICATED_PAD | Freq: Every day | CUTANEOUS | Status: DC
  Administered 2023-03-04 – 2023-03-17 (×12): 6 via TOPICAL

## 2023-03-04 MED ORDER — SODIUM CHLORIDE 0.9% IV SOLUTION: Freq: Once | INTRAVENOUS | Status: DC

## 2023-03-04 MED ORDER — PANTOPRAZOLE SODIUM 40 MG IV SOLR
40.0000 mg | Freq: Two times a day (BID) | INTRAVENOUS | Status: DC
  Administered 2023-03-07 – 2023-03-17 (×19): 40 mg via INTRAVENOUS
  Filled 2023-03-04 (×19): qty 10

## 2023-03-04 NOTE — Consult Note (Signed)
[facility] Consult  Referring Provider: ER Primary Care Physician:  [name], MD Primary Gastroenterologist: Previous patient of Dr. Bing [facility]  Reason for Consultation: Coffee-ground emesis  HPI: Alan Willis is a 77 y.o. male was in his usual state of health until Saturday, 3 days ago when he had few episodes of diarrhea, loose stools, followed by multiple episodes of vomiting. Patient's wife states that initially he vomited food that he had eaten for dinner on Friday, however continued to have worsening abdominal distention, abdominal pain, had multiple episodes of retching and vomiting, subsequent episodes of vomiting contained coffee-ground material and appeared gritty. When he came to the ER he was found to be dehydrated with a creatinine of 1.81 had elevated troponin I of 104, severe hyponatremia 127. Initial hemoglobin was 16.2 which was likely related to hemoconcentration, subsequent hemoglobin 14.3/11.6/9.3/8.5  He had significant leukocytosis 21.7/33.7/23 and had elevated PT/INR of 40.2/4.1 which is now 30.3/2.9 after 3 units FFP.  CT abdomen and pelvis without contrast showed fluid within distal esophagus, moderately dilated stomach, multiple fluid-filled dilated loops of small bowel throughout abdomen with abrupt transition to decompressed bowel anteriorly within central abdomen with fecalization of small bowel content immediately upstream to this transition site, small loop of distal small bowel extends into patient's right inguinal hernia. He also has dilated gallbladder with a 1.7 cm stone in region of gallbladder neck.  He is on Coumadin for aortic valve replacement, mechanical valve and atrial fibrillation, last dose was on Saturday morning, 2 days ago.  Virtual colonoscopy 2007: Concerning for annular constricting lesion in sigmoid Flexible sigmoidoscopy, 2007, Dr. 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 Authorizing Provider  Cholecalciferol (VITAMIN D3) 1000 units CAPS Take 1,000 Units by mouth daily.   Yes [provider]  dofetilide (TIKOSYN) 250 MCG capsule Take 250 mcg by mouth every 12 (twelve) hours. 10/25/21  Yes [provider]  doxycycline (VIBRA-TABS) 100 MG tablet Take 100 mg by mouth See admin instructions. Take 100 mg by mouth 30-60 minutes prior to dental procedures 05/25/22  Yes [provider]  ferrous sulfate 325 (65 FE) MG tablet Take 325 mg by mouth daily with breakfast.   Yes [provider]  furosemide (LASIX) 40 MG tablet Take 40 mg by mouth in the morning. 09/05/21  Yes [provider]  IMODIUM A-D 2 MG tablet Take 2 mg by mouth 4 (four) times daily as needed for diarrhea or loose stools.   Yes [provider]  levETIRAcetam (KEPPRA) 500 MG tablet Take 1  tablet in AM and 1 and 1/2 tablets in PM Patient taking differently: Take 500-750 mg by mouth See admin instructions. Take 500 mg by mouth in the morning and 750 mg in the evening 12/31/22  Yes Van Clines, MD  magnesium oxide (MAG-OX) 400 (240 Mg) MG tablet Take 400 mg by mouth daily.   Yes [provider]  spironolactone (ALDACTONE) 25 MG tablet Take 12.5 mg by mouth in the morning. 10/24/21  Yes [provider]  TYLENOL 500 MG tablet Take 500 mg by mouth every 6 (six) hours as needed for mild pain (pain score 1-3) or headache.   Yes [provider]  warfarin (COUMADIN) 6 MG tablet Take 3-6 mg by mouth See admin instructions. Take 6 mg by mouth in the morning on Sun/Mon/Wed/Thurs/Fri/Sat and 3 mg on Tues   Yes [provider]    Current Facility-Administered Medications  Medication Dose Route Frequency Provider Last Rate Last Admin   0.9 %  sodium chloride infusion (Manually program via Guardrails IV Fluids)   Intravenous Once Briant Sites, DO   Held at 03/03/23 2333   0.9 %  sodium chloride infusion (Manually program via Guardrails IV Fluids)   Intravenous Once Harris, Alphonzo Lemmings D, NP       0.9 %  sodium chloride infusion  250 mL Intravenous Continuous Ranee Gosselin, MD   Stopped at 03/04/23 0322   acetaminophen (TYLENOL) suppository 650 mg  650 mg Rectal Q6H PRN Karie Soda, MD   650 mg at 03/03/23 2134   albumin human 25 % solution 25 g  25 g Intravenous Q6H Lorin Glass, MD   Stopped at 03/04/23 0700   alum & mag hydroxide-simeth (MAALOX/MYLANTA) 200-200-20 MG/5ML suspension 30 mL  30 mL Oral Q6H PRN Karie Soda, MD       bisacodyl (DULCOLAX) suppository 10 mg  10 mg Rectal Q12H PRN Karie Soda, MD       Chlorhexidine Gluconate Cloth 2 % PADS 6 each  6 each Topical Daily Bobette Mo, MD       fentaNYL (SUBLIMAZE) injection 12.5-25 mcg  12.5-25 mcg Intravenous Q2H PRN Karie Soda, MD   12.5 mcg at 03/03/23 2034   lactated ringers bolus 1,000 mL  1,000 mL Intravenous Q8H PRN Karie Soda, MD       lactated ringers infusion   Intravenous Continuous Bobette Mo, MD 100 mL/hr at 03/04/23 0900 Infusion Verify at 03/04/23  0900   levETIRAcetam (KEPPRA) IVPB 500 mg/100 mL premix  500 mg Intravenous Q24H Bobette Mo, MD 400 mL/hr at 03/04/23 0930 500 mg at 03/04/23 0930   And   levETIRAcetam (KEPPRA) 750 mg in sodium chloride 0.9 % 100 mL IVPB  750 mg Intravenous Q24H Bobette Mo, MD   Stopped at 03/03/23 2332   lidocaine (XYLOCAINE) 2 % jelly 1 Application  1 Application Topical Once Lorre Nick, MD       magic mouthwash  15 mL Oral QID PRN Karie Soda, MD       menthol-cetylpyridinium (CEPACOL) lozenge 3 mg  1 lozenge Oral PRN Karie Soda, MD       methocarbamol (ROBAXIN) injection 1,000 mg  1,000 mg Intravenous Q6H PRN Karie Soda, MD       naphazoline-glycerin (CLEAR EYES REDNESS) ophth solution 1-2 drop  1-2 drop Both Eyes QID PRN Karie Soda, MD       ondansetron [facility]) injection 4 mg  4 mg Intravenous Q6H PRN Karie Soda, MD  4 mg at 03/03/23 2134   Or   ondansetron (ZOFRAN) 8 mg in sodium chloride 0.9 % 50 mL IVPB  8 mg Intravenous Q6H PRN Karie Soda, MD       Oral care mouth rinse  15 mL Mouth Rinse 4 times per day Bobette Mo, MD   15 mL at 03/04/23 0800   Oral care mouth rinse  15 mL Mouth Rinse PRN Bobette Mo, MD       pantoprazole (PROTONIX) injection 40 mg  40 mg Intravenous Q5 min Kerin Salen, MD       Followed by   pantoprazole (PROTONIX) injection 40 mg  40 mg Intravenous Q6H Kerin Salen, MD       Followed by   Melene Muller ON 03/07/2023] pantoprazole (PROTONIX) injection 40 mg  40 mg Intravenous Q12H Kerin Salen, MD       phenol (CHLORASEPTIC) mouth spray 2 spray  2 spray Mouth/Throat PRN Karie Soda, MD       phenylephrine (NEO-SYNEPHRINE) 20mg /NS premix infusion  25-200 mcg/min Intravenous Titrated Josiah Lobo T, MD 18.75 mL/hr at 03/04/23 0900 25 mcg/min at 03/04/23 0900   prochlorperazine (COMPAZINE) injection 5-10 mg  5-10 mg Intravenous Q4H PRN Karie Soda, MD   10 mg at 03/03/23 1504   sodium chloride (OCEAN) 0.65 % nasal spray 1-2  spray  1-2 spray Each Nare Q6H PRN Karie Soda, MD        Allergies as of 03/03/2023 - Review Complete 03/03/2023  Allergen Reaction Noted   Amoxicillin-pot clavulanate Rash 08/24/2020   Codeine Other (See Comments) 04/14/2020    History reviewed. No pertinent family history.  Social History   Socioeconomic History   Marital status: Married    Spouse name: Not on file   Number of children: Not on file   Years of education: Not on file   Highest education level: Not on file  Occupational History   Not on file  Tobacco Use   Smoking status: Some Days    Types: Cigarettes, Cigars   Smokeless tobacco: Never  Vaping Use   Vaping status: Never Used  Substance and Sexual Activity   Alcohol use: Not Currently    Comment: daily   Drug use: Never   Sexual activity: Not on file  Other Topics Concern   Not on file  Social History Narrative   Right handed    Lives with family    Retired    Drinks a etoh free beer a day    Social Determinants of Corporate investment banker Strain: Not on file  Food Insecurity: No Food Insecurity (03/03/2023)   Hunger Vital Sign    Worried About Running Out of Food in the Last Year: Never true    Ran Out of Food in the Last Year: Never true  Transportation Needs: No Transportation Needs (03/03/2023)   PRAPARE - Administrator, Civil Service (Medical): No    Lack of Transportation (Non-Medical): No  Physical Activity: Not on file  Stress: Not on file  Social Connections: Not on file  Intimate Partner Violence: Not At Risk (03/03/2023)   Humiliation, Afraid, Rape, and Kick questionnaire    Fear of Current or Ex-Partner: No    Emotionally Abused: No    Physically Abused: No    Sexually Abused: No    Review of Systems: As per HPI Physical Exam: Vital signs in last 24 hours: Temp:  [98.3 F (36.8 C)-101.5 F (38.6 C)] 99.1 F (  37.3 C) (11/18 0900) Pulse Rate:  [66-151] 75 (11/18 0900) Resp:  [10-32] 19 (11/18 0900) BP:  (65-131)/(37-96) 99/57 (11/18 0900) SpO2:  [90 %-100 %] 100 % (11/18 0900) Weight:  [79.3 kg] 79.3 kg (11/17 1320) Last BM Date : 03/03/23 (PTA)  General: Sleepy, NG tube To wall suction draining liquid dark red blood Head:  Normocephalic and atraumatic. Eyes:  Sclera clear, no icterus.  Mild pallor Ears:  Normal auditory acuity. Nose:  No deformity, discharge,  or lesions. Mouth:  No deformity or lesions.  Oropharynx pink & moist. Neck:  Supple; no masses or thyromegaly. Lungs:  Clear throughout to auscultation.   No wheezes, crackles, or rhonchi. No acute distress. Heart:  Regular rate and rhythm; no murmurs, clicks, rubs,  or gallops. Extremities:  Without clubbing or edema. Neurologic:  Alert and  oriented x4;  grossly normal neurologically. Skin:  Intact without significant lesions or rashes. Psych:  Alert and cooperative. Normal mood and affect. Abdomen:  Soft, nontender and distended. No masses, hepatosplenomegaly or hernias noted.  Sluggish bowel sounds    Lab Results: Recent Labs    03/03/23 0843 03/03/23 1404 03/03/23 1922 03/04/23 0012 03/04/23 0242  WBC 21.7*  --  33.7*  --  23.0*  HGB 16.2   < > 11.6* 9.3* 8.5*  HCT 45.9   < > 33.6* 26.8* 23.8*  PLT 181  --  155  --  128*   < > = values in this interval not displayed.   BMET Recent Labs    03/03/23 1404 03/03/23 1922 03/04/23 0242  NA 126* 126* 125*  K 4.4 4.2 4.4  CL 84* 89* 90*  CO2 28 24 28   GLUCOSE 146* 150* 151*  BUN 50* 49* 46*  CREATININE 1.76* 1.56* 1.55*  CALCIUM 9.0 8.2* 8.1*   LFT Recent Labs    03/04/23 0242  PROT 5.6*  ALBUMIN 3.6  AST 26  ALT 13  ALKPHOS 30*  BILITOT 2.3*   PT/INR Recent Labs    03/03/23 1922 03/04/23 0242  LABPROT 40.2* 30.3*  INR 4.1* 2.9*    Studies/Results: DG CHEST PORT 1 VIEW  Result Date: 03/03/2023 CLINICAL DATA:  NG tube placement EXAM: PORTABLE CHEST 1 VIEW COMPARISON:  03/03/2023 FINDINGS: NG tube is in the stomach. Prior median  sternotomy and valve replacement. Heart and mediastinal contours within normal limits. No confluent airspace opacities or effusions. Minimal bibasilar opacities, favor atelectasis. No acute bony abnormality. IMPRESSION: NG tube in the stomach. Bibasilar atelectasis. Electronically Signed   By: Charlett Nose M.D.   On: 03/03/2023 21:37   DG Chest Port 1 View  Result Date: 03/03/2023 CLINICAL DATA:  Nausea and vomiting. EXAM: PORTABLE CHEST 1 VIEW COMPARISON:  03/16/2021. FINDINGS: Cardiac silhouette appears prominent. No pneumonia or pulmonary edema. No pneumothorax or pleural effusion. NG tube tip is at the epigastric region and side port is overlying distal esophagus. The tube should be advanced about 7 cm. IMPRESSION: Enlarged cardiac silhouette. Lungs are clear. NG tube should be advanced. Electronically Signed   By: Layla Maw M.D.   On: 03/03/2023 18:24   DG Abd Portable 1 View  Result Date: 03/03/2023 CLINICAL DATA:  Status post NG tube placement. EXAM: PORTABLE ABDOMEN - 1 VIEW COMPARISON:  CT abdomen and pelvis 03/03/2023 FINDINGS: An enteric tube has been placed with its tip overlying the distal esophagus near the GE junction. Gastric and small bowel distension were more fully evaluated on today's CT. A calcified gallstone and aortic valve  replacement are noted. IMPRESSION: Enteric tube tip near the GE junction. Recommend advancement and obtaining a repeat radiograph to ensure intragastric placement. Electronically Signed   By: Sebastian Ache M.D.   On: 03/03/2023 12:42   CT ABDOMEN PELVIS WO CONTRAST  Result Date: 03/03/2023 CLINICAL DATA:  Abdominal pain, acute, nonlocalized EXAM: CT ABDOMEN AND PELVIS WITHOUT CONTRAST TECHNIQUE: Multidetector CT imaging of the abdomen and pelvis was performed following the standard protocol without IV contrast. RADIATION DOSE REDUCTION: This exam was performed according to the departmental dose-optimization program which includes automated exposure  control, adjustment of the mA and/or kV according to patient size and/or use of iterative reconstruction technique. COMPARISON:  04/14/2020 FINDINGS: Lower chest: Lung bases are clear.  Coronary artery atherosclerosis. Hepatobiliary: No focal liver abnormality identified on noncontrast images. Markedly dilated gallbladder. 1.7 cm stone in the region of the gallbladder neck. No pericholecystic inflammatory changes by CT. No intrahepatic biliary dilatation. Pancreas: Unremarkable. No pancreatic ductal dilatation or surrounding inflammatory changes. Spleen: Normal in size without focal abnormality. Adrenals/Urinary Tract: Unremarkable adrenal glands. Bilateral renal cysts, which require no follow-up imaging. Kidneys otherwise unremarkable. No stone or hydronephrosis. Urinary bladder within normal limits. Stomach/Bowel: Fluid within the distal esophagus. Moderately dilated stomach. Multiple fluid-filled, dilated loops of small bowel throughout the abdomen with abrupt transition to decompressed bowel anteriorly within the central abdomen (series 2, image 54). There is fecalization of small bowel content immediately upstream to the transition site. Distal small bowel is decompressed. A small loop of distal small bowel extends into patient's right inguinal hernia. Normal appendix in the right lower quadrant. Colonic diverticulosis. No colonic wall thickening or inflammatory changes. Vascular/Lymphatic: Extensive aortoiliac atherosclerosis. No lymphadenopathy. Reproductive: Prostate gland within normal limits. Other: No ascites or pneumoperitoneum. Right inguinal hernia containing fat and distal small bowel. Musculoskeletal: No acute or significant osseous findings. IMPRESSION: 1. Small-bowel obstruction with abrupt transition to decompressed bowel anteriorly within the central abdomen, possibly secondary to adhesions. 2. Markedly dilated gallbladder with 1.7 cm stone in the region of the gallbladder neck. No  pericholecystic inflammatory changes by CT. Correlate clinically for acute cholecystitis. 3. Right inguinal hernia containing fat and distal small bowel. 4. Colonic diverticulosis without evidence of acute diverticulitis. 5. Aortic atherosclerosis (ICD10-I70.0). These results were called by telephone at the time of interpretation on 03/03/2023 at 10:26 am to provider Lorre Nick , who verbally acknowledged these results. Electronically Signed   By: Duanne Guess D.O.   On: 03/03/2023 10:27    Impression: Small bowel obstruction likely related to adhesions, history of prior sigmoid colectomy for diverticular disease  Coffee-ground emesis, now has dark blood return from NG tube, could be related to Mallory-Weiss tear from multiple episodes of retching  Mechanical heart valve, was on Coumadin, elevated INR of 4, now 2.9 after 3 units FFP transfusion  Plan: Abdominal x-ray with small bowel protocol has been ordered, results pending. Surgery on board.  I would recommend vitamin K 10 mg IV x 1 dose to keep INR less than 2.  If needed we can consider using IV heparin for mechanical heart valve.  He is on IV pressors but not tachycardic.  I will increase his Protonix, he is to receive 80 mg IV x 1 followed by 40 mg every 6 hours for 72 hours and thereafter 40 mg every 12 hours.  Hopefully small bowel obstruction will resolve with conservative management, IV fluids and NG tube decompression.  If hemoglobin continues to trend down, we may have to consider EGD,  however will need INR to be less than 2 for any therapeutic intervention.  This was discussed in details with the patient's wife at bedside and his son at bedside as well as the patient's nurse.   LOS: 1 day   Kerin Salen, MD  03/04/2023, 10:31 AM

## 2023-03-04 NOTE — Progress Notes (Signed)
NAME:  Alan Willis, MRN:  [id number], DOB:  November 23, 1945, LOS: 1 ADMISSION DATE:  03/03/2023, CONSULTATION DATE:  03/03/23 REFERRING MD:  Robb Matar, CHIEF COMPLAINT:  hematemesis   History of Present Illness:  77 year old man w/ hx of afib, AVR on warfarin p/w anorexia, abd pain followed by hematemesis.  Workup has revealed SBO.  Has been in Afib vs flutter with borderline Bps.  PCCM consulted to assist with management.  Overall patient feels tired but better than admit.  Some ongoing abd pain and mild SOB.  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 rate 19, height 5\' 10"  (1.778 m), weight 79.3 kg, SpO2 100%.        Intake/Output Summary (Last 24 hours) at 03/04/2023 0749 Last data filed at 03/04/2023 0700 Gross per 24 hour  Intake 6897.38 ml  Output 4350 ml  Net 2547.38 ml   Filed Weights   03/03/23 0813 03/03/23 1320  Weight: 79.4 kg 79.3 kg    Examination: General: Acute on chronically ill appearing elderly male, in NAD HEENT: [place]/AT, MM pink/moist, PERRL,  Neuro: Alert and oriented  x1 CV: s1s2 regular rate and rhythm, no murmur, rubs, or gallops,  PULM:  Clear to auscultation, no increased work of breathing, on RA GI: soft, bowel sounds hypoactive in all 4 quadrants, non-tender, non-distended Extremities: warm/dry, no edema  Skin: no rashes or lesions  Resolved Hospital Problem list   N/A  Assessment & Plan:   SIRS-type response, multifactorial  -with Afib/flutter w/ RVR, relative hypotension in the setting of GI bleed, hyponatremia, AKI, stress cardiomyopathy: all related to dehydration and SBO; no real signs of infection at this time; abd is not acute P: Supplemental oxygen as needed  Monitor off antibiotics  Aggressive IV hydration provided  Continue pressors for MAP< 65  Monitor urine output  Small bowel obstruction UGIB -Likely related to obstruction but unable to rule out possible ulcer  P: Transfuse one unit PRBC now given significant drop in H&H and need for low dose pressors  Continue pressor support for MAP goal of > 65 Trend CBC  Identify and treat course  Consult GI  NPO   Mechanical aortic valve replacement  Chronic anticoagulation with Coumadin  Supratherapeutic INR  Chronic A-flutter with A-fib RVR on admission  P:  Recommend cardiology consult  Continuous telemetry  Hold anticoagulation Trend INR  Bridge with Heparin drip  Tikosyn washout  Pressors as above  Delirium  Seizures Memory issues P: Delirium precautions  Nutrition and bowel regiment  Seizure precautions  Aspirations precautions  Best Practice (right click and "Reselect all SmartList Selections" daily)  Per primary  Critical care time:  CRITICAL CARE Performed by: Ioan Landini D. Harris  Total critical care time: 38 minutes  Critical care time was exclusive of separately billable procedures and treating other patients.  Critical care was necessary to treat or prevent imminent or life-threatening deterioration.  Critical care was time spent personally by  me on the following activities: development of treatment plan with patient and/or surrogate as well as nursing, discussions with consultants, evaluation of patient's response to treatment, examination of patient, obtaining history from patient or surrogate, ordering and performing treatments and interventions, ordering and review of laboratory studies, ordering and review of radiographic studies, pulse oximetry and re-evaluation of patient's condition.  Keymani Mclean D. Harris, NP-C [facility] Personal contact information can be found on Amion  If no contact or response made please call [phone] 03/04/2023, 7:53 AM

## 2023-03-04 NOTE — Plan of Care (Signed)
Discussed with patient and wife plan of care for the evening, pain management and medications with some teach back displayed reinforcement needed to the patient.  Wife has signed informed consent for possible procedure tomorrow depending on lab values.    What is important to the patient tonight is rest and darkness.  Placed a blanket over the computer because it was disturbing his sleep with the light (even on a dark screen).  Problem: Education: Goal: Knowledge of General Education information will improve Description: Including pain rating scale, medication(s)/side effects and non-pharmacologic comfort measures Outcome: Progressing   Problem: Health Behavior/Discharge Planning: Goal: Ability to manage health-related needs will improve Outcome: Not Progressing

## 2023-03-04 NOTE — H&P (View-Only) (Signed)
[facility] Consult  Referring Provider: ER Primary Care Physician:  [name], MD Primary Gastroenterologist: Previous patient of Dr. Bing [facility]  Reason for Consultation: Coffee-ground emesis  HPI: Alan Willis is a 77 y.o. male was in his usual state of health until Saturday, 3 days ago when he had few episodes of diarrhea, loose stools, followed by multiple episodes of vomiting. Patient's wife states that initially he vomited food that he had eaten for dinner on Friday, however continued to have worsening abdominal distention, abdominal pain, had multiple episodes of retching and vomiting, subsequent episodes of vomiting contained coffee-ground material and appeared gritty. When he came to the ER he was found to be dehydrated with a creatinine of 1.81 had elevated troponin I of 104, severe hyponatremia 127. Initial hemoglobin was 16.2 which was likely related to hemoconcentration, subsequent hemoglobin 14.3/11.6/9.3/8.5  He had significant leukocytosis 21.7/33.7/23 and had elevated PT/INR of 40.2/4.1 which is now 30.3/2.9 after 3 units FFP.  CT abdomen and pelvis without contrast showed fluid within distal esophagus, moderately dilated stomach, multiple fluid-filled dilated loops of small bowel throughout abdomen with abrupt transition to decompressed bowel anteriorly within central abdomen with fecalization of small bowel content immediately upstream to this transition site, small loop of distal small bowel extends into patient's right inguinal hernia. He also has dilated gallbladder with a 1.7 cm stone in region of gallbladder neck.  He is on Coumadin for aortic valve replacement, mechanical valve and atrial fibrillation, last dose was on Saturday morning, 2 days ago.  Virtual colonoscopy 2007: Concerning for annular constricting lesion in sigmoid Flexible sigmoidoscopy, 2007, Dr. 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 Authorizing Provider  Cholecalciferol (VITAMIN D3) 1000 units CAPS Take 1,000 Units by mouth daily.   Yes [provider]  dofetilide (TIKOSYN) 250 MCG capsule Take 250 mcg by mouth every 12 (twelve) hours. 10/25/21  Yes [provider]  doxycycline (VIBRA-TABS) 100 MG tablet Take 100 mg by mouth See admin instructions. Take 100 mg by mouth 30-60 minutes prior to dental procedures 05/25/22  Yes [provider]  ferrous sulfate 325 (65 FE) MG tablet Take 325 mg by mouth daily with breakfast.   Yes [provider]  furosemide (LASIX) 40 MG tablet Take 40 mg by mouth in the morning. 09/05/21  Yes [provider]  IMODIUM A-D 2 MG tablet Take 2 mg by mouth 4 (four) times daily as needed for diarrhea or loose stools.   Yes [provider]  levETIRAcetam (KEPPRA) 500 MG tablet Take 1  tablet in AM and 1 and 1/2 tablets in PM Patient taking differently: Take 500-750 mg by mouth See admin instructions. Take 500 mg by mouth in the morning and 750 mg in the evening 12/31/22  Yes Van Clines, MD  magnesium oxide (MAG-OX) 400 (240 Mg) MG tablet Take 400 mg by mouth daily.   Yes [provider]  spironolactone (ALDACTONE) 25 MG tablet Take 12.5 mg by mouth in the morning. 10/24/21  Yes [provider]  TYLENOL 500 MG tablet Take 500 mg by mouth every 6 (six) hours as needed for mild pain (pain score 1-3) or headache.   Yes [provider]  warfarin (COUMADIN) 6 MG tablet Take 3-6 mg by mouth See admin instructions. Take 6 mg by mouth in the morning on Sun/Mon/Wed/Thurs/Fri/Sat and 3 mg on Tues   Yes [provider]    Current Facility-Administered Medications  Medication Dose Route Frequency Provider Last Rate Last Admin   0.9 %  sodium chloride infusion (Manually program via Guardrails IV Fluids)   Intravenous Once Briant Sites, DO   Held at 03/03/23 2333   0.9 %  sodium chloride infusion (Manually program via Guardrails IV Fluids)   Intravenous Once Harris, Alphonzo Lemmings D, NP       0.9 %  sodium chloride infusion  250 mL Intravenous Continuous Ranee Gosselin, MD   Stopped at 03/04/23 0322   acetaminophen (TYLENOL) suppository 650 mg  650 mg Rectal Q6H PRN Karie Soda, MD   650 mg at 03/03/23 2134   albumin human 25 % solution 25 g  25 g Intravenous Q6H Lorin Glass, MD   Stopped at 03/04/23 0700   alum & mag hydroxide-simeth (MAALOX/MYLANTA) 200-200-20 MG/5ML suspension 30 mL  30 mL Oral Q6H PRN Karie Soda, MD       bisacodyl (DULCOLAX) suppository 10 mg  10 mg Rectal Q12H PRN Karie Soda, MD       Chlorhexidine Gluconate Cloth 2 % PADS 6 each  6 each Topical Daily Bobette Mo, MD       fentaNYL (SUBLIMAZE) injection 12.5-25 mcg  12.5-25 mcg Intravenous Q2H PRN Karie Soda, MD   12.5 mcg at 03/03/23 2034   lactated ringers bolus 1,000 mL  1,000 mL Intravenous Q8H PRN Karie Soda, MD       lactated ringers infusion   Intravenous Continuous Bobette Mo, MD 100 mL/hr at 03/04/23 0900 Infusion Verify at 03/04/23  0900   levETIRAcetam (KEPPRA) IVPB 500 mg/100 mL premix  500 mg Intravenous Q24H Bobette Mo, MD 400 mL/hr at 03/04/23 0930 500 mg at 03/04/23 0930   And   levETIRAcetam (KEPPRA) 750 mg in sodium chloride 0.9 % 100 mL IVPB  750 mg Intravenous Q24H Bobette Mo, MD   Stopped at 03/03/23 2332   lidocaine (XYLOCAINE) 2 % jelly 1 Application  1 Application Topical Once Lorre Nick, MD       magic mouthwash  15 mL Oral QID PRN Karie Soda, MD       menthol-cetylpyridinium (CEPACOL) lozenge 3 mg  1 lozenge Oral PRN Karie Soda, MD       methocarbamol (ROBAXIN) injection 1,000 mg  1,000 mg Intravenous Q6H PRN Karie Soda, MD       naphazoline-glycerin (CLEAR EYES REDNESS) ophth solution 1-2 drop  1-2 drop Both Eyes QID PRN Karie Soda, MD       ondansetron [facility]) injection 4 mg  4 mg Intravenous Q6H PRN Karie Soda, MD  4 mg at 03/03/23 2134   Or   ondansetron (ZOFRAN) 8 mg in sodium chloride 0.9 % 50 mL IVPB  8 mg Intravenous Q6H PRN Karie Soda, MD       Oral care mouth rinse  15 mL Mouth Rinse 4 times per day Bobette Mo, MD   15 mL at 03/04/23 0800   Oral care mouth rinse  15 mL Mouth Rinse PRN Bobette Mo, MD       pantoprazole (PROTONIX) injection 40 mg  40 mg Intravenous Q5 min Kerin Salen, MD       Followed by   pantoprazole (PROTONIX) injection 40 mg  40 mg Intravenous Q6H Kerin Salen, MD       Followed by   Melene Muller ON 03/07/2023] pantoprazole (PROTONIX) injection 40 mg  40 mg Intravenous Q12H Kerin Salen, MD       phenol (CHLORASEPTIC) mouth spray 2 spray  2 spray Mouth/Throat PRN Karie Soda, MD       phenylephrine (NEO-SYNEPHRINE) 20mg /NS premix infusion  25-200 mcg/min Intravenous Titrated Josiah Lobo T, MD 18.75 mL/hr at 03/04/23 0900 25 mcg/min at 03/04/23 0900   prochlorperazine (COMPAZINE) injection 5-10 mg  5-10 mg Intravenous Q4H PRN Karie Soda, MD   10 mg at 03/03/23 1504   sodium chloride (OCEAN) 0.65 % nasal spray 1-2  spray  1-2 spray Each Nare Q6H PRN Karie Soda, MD        Allergies as of 03/03/2023 - Review Complete 03/03/2023  Allergen Reaction Noted   Amoxicillin-pot clavulanate Rash 08/24/2020   Codeine Other (See Comments) 04/14/2020    History reviewed. No pertinent family history.  Social History   Socioeconomic History   Marital status: Married    Spouse name: Not on file   Number of children: Not on file   Years of education: Not on file   Highest education level: Not on file  Occupational History   Not on file  Tobacco Use   Smoking status: Some Days    Types: Cigarettes, Cigars   Smokeless tobacco: Never  Vaping Use   Vaping status: Never Used  Substance and Sexual Activity   Alcohol use: Not Currently    Comment: daily   Drug use: Never   Sexual activity: Not on file  Other Topics Concern   Not on file  Social History Narrative   Right handed    Lives with family    Retired    Drinks a etoh free beer a day    Social Determinants of Corporate investment banker Strain: Not on file  Food Insecurity: No Food Insecurity (03/03/2023)   Hunger Vital Sign    Worried About Running Out of Food in the Last Year: Never true    Ran Out of Food in the Last Year: Never true  Transportation Needs: No Transportation Needs (03/03/2023)   PRAPARE - Administrator, Civil Service (Medical): No    Lack of Transportation (Non-Medical): No  Physical Activity: Not on file  Stress: Not on file  Social Connections: Not on file  Intimate Partner Violence: Not At Risk (03/03/2023)   Humiliation, Afraid, Rape, and Kick questionnaire    Fear of Current or Ex-Partner: No    Emotionally Abused: No    Physically Abused: No    Sexually Abused: No    Review of Systems: As per HPI Physical Exam: Vital signs in last 24 hours: Temp:  [98.3 F (36.8 C)-101.5 F (38.6 C)] 99.1 F (  37.3 C) (11/18 0900) Pulse Rate:  [66-151] 75 (11/18 0900) Resp:  [10-32] 19 (11/18 0900) BP:  (65-131)/(37-96) 99/57 (11/18 0900) SpO2:  [90 %-100 %] 100 % (11/18 0900) Weight:  [79.3 kg] 79.3 kg (11/17 1320) Last BM Date : 03/03/23 (PTA)  General: Sleepy, NG tube To wall suction draining liquid dark red blood Head:  Normocephalic and atraumatic. Eyes:  Sclera clear, no icterus.  Mild pallor Ears:  Normal auditory acuity. Nose:  No deformity, discharge,  or lesions. Mouth:  No deformity or lesions.  Oropharynx pink & moist. Neck:  Supple; no masses or thyromegaly. Lungs:  Clear throughout to auscultation.   No wheezes, crackles, or rhonchi. No acute distress. Heart:  Regular rate and rhythm; no murmurs, clicks, rubs,  or gallops. Extremities:  Without clubbing or edema. Neurologic:  Alert and  oriented x4;  grossly normal neurologically. Skin:  Intact without significant lesions or rashes. Psych:  Alert and cooperative. Normal mood and affect. Abdomen:  Soft, nontender and distended. No masses, hepatosplenomegaly or hernias noted.  Sluggish bowel sounds    Lab Results: Recent Labs    03/03/23 0843 03/03/23 1404 03/03/23 1922 03/04/23 0012 03/04/23 0242  WBC 21.7*  --  33.7*  --  23.0*  HGB 16.2   < > 11.6* 9.3* 8.5*  HCT 45.9   < > 33.6* 26.8* 23.8*  PLT 181  --  155  --  128*   < > = values in this interval not displayed.   BMET Recent Labs    03/03/23 1404 03/03/23 1922 03/04/23 0242  NA 126* 126* 125*  K 4.4 4.2 4.4  CL 84* 89* 90*  CO2 28 24 28   GLUCOSE 146* 150* 151*  BUN 50* 49* 46*  CREATININE 1.76* 1.56* 1.55*  CALCIUM 9.0 8.2* 8.1*   LFT Recent Labs    03/04/23 0242  PROT 5.6*  ALBUMIN 3.6  AST 26  ALT 13  ALKPHOS 30*  BILITOT 2.3*   PT/INR Recent Labs    03/03/23 1922 03/04/23 0242  LABPROT 40.2* 30.3*  INR 4.1* 2.9*    Studies/Results: DG CHEST PORT 1 VIEW  Result Date: 03/03/2023 CLINICAL DATA:  NG tube placement EXAM: PORTABLE CHEST 1 VIEW COMPARISON:  03/03/2023 FINDINGS: NG tube is in the stomach. Prior median  sternotomy and valve replacement. Heart and mediastinal contours within normal limits. No confluent airspace opacities or effusions. Minimal bibasilar opacities, favor atelectasis. No acute bony abnormality. IMPRESSION: NG tube in the stomach. Bibasilar atelectasis. Electronically Signed   By: Charlett Nose M.D.   On: 03/03/2023 21:37   DG Chest Port 1 View  Result Date: 03/03/2023 CLINICAL DATA:  Nausea and vomiting. EXAM: PORTABLE CHEST 1 VIEW COMPARISON:  03/16/2021. FINDINGS: Cardiac silhouette appears prominent. No pneumonia or pulmonary edema. No pneumothorax or pleural effusion. NG tube tip is at the epigastric region and side port is overlying distal esophagus. The tube should be advanced about 7 cm. IMPRESSION: Enlarged cardiac silhouette. Lungs are clear. NG tube should be advanced. Electronically Signed   By: Layla Maw M.D.   On: 03/03/2023 18:24   DG Abd Portable 1 View  Result Date: 03/03/2023 CLINICAL DATA:  Status post NG tube placement. EXAM: PORTABLE ABDOMEN - 1 VIEW COMPARISON:  CT abdomen and pelvis 03/03/2023 FINDINGS: An enteric tube has been placed with its tip overlying the distal esophagus near the GE junction. Gastric and small bowel distension were more fully evaluated on today's CT. A calcified gallstone and aortic valve  replacement are noted. IMPRESSION: Enteric tube tip near the GE junction. Recommend advancement and obtaining a repeat radiograph to ensure intragastric placement. Electronically Signed   By: Sebastian Ache M.D.   On: 03/03/2023 12:42   CT ABDOMEN PELVIS WO CONTRAST  Result Date: 03/03/2023 CLINICAL DATA:  Abdominal pain, acute, nonlocalized EXAM: CT ABDOMEN AND PELVIS WITHOUT CONTRAST TECHNIQUE: Multidetector CT imaging of the abdomen and pelvis was performed following the standard protocol without IV contrast. RADIATION DOSE REDUCTION: This exam was performed according to the departmental dose-optimization program which includes automated exposure  control, adjustment of the mA and/or kV according to patient size and/or use of iterative reconstruction technique. COMPARISON:  04/14/2020 FINDINGS: Lower chest: Lung bases are clear.  Coronary artery atherosclerosis. Hepatobiliary: No focal liver abnormality identified on noncontrast images. Markedly dilated gallbladder. 1.7 cm stone in the region of the gallbladder neck. No pericholecystic inflammatory changes by CT. No intrahepatic biliary dilatation. Pancreas: Unremarkable. No pancreatic ductal dilatation or surrounding inflammatory changes. Spleen: Normal in size without focal abnormality. Adrenals/Urinary Tract: Unremarkable adrenal glands. Bilateral renal cysts, which require no follow-up imaging. Kidneys otherwise unremarkable. No stone or hydronephrosis. Urinary bladder within normal limits. Stomach/Bowel: Fluid within the distal esophagus. Moderately dilated stomach. Multiple fluid-filled, dilated loops of small bowel throughout the abdomen with abrupt transition to decompressed bowel anteriorly within the central abdomen (series 2, image 54). There is fecalization of small bowel content immediately upstream to the transition site. Distal small bowel is decompressed. A small loop of distal small bowel extends into patient's right inguinal hernia. Normal appendix in the right lower quadrant. Colonic diverticulosis. No colonic wall thickening or inflammatory changes. Vascular/Lymphatic: Extensive aortoiliac atherosclerosis. No lymphadenopathy. Reproductive: Prostate gland within normal limits. Other: No ascites or pneumoperitoneum. Right inguinal hernia containing fat and distal small bowel. Musculoskeletal: No acute or significant osseous findings. IMPRESSION: 1. Small-bowel obstruction with abrupt transition to decompressed bowel anteriorly within the central abdomen, possibly secondary to adhesions. 2. Markedly dilated gallbladder with 1.7 cm stone in the region of the gallbladder neck. No  pericholecystic inflammatory changes by CT. Correlate clinically for acute cholecystitis. 3. Right inguinal hernia containing fat and distal small bowel. 4. Colonic diverticulosis without evidence of acute diverticulitis. 5. Aortic atherosclerosis (ICD10-I70.0). These results were called by telephone at the time of interpretation on 03/03/2023 at 10:26 am to provider Lorre Nick , who verbally acknowledged these results. Electronically Signed   By: Duanne Guess D.O.   On: 03/03/2023 10:27    Impression: Small bowel obstruction likely related to adhesions, history of prior sigmoid colectomy for diverticular disease  Coffee-ground emesis, now has dark blood return from NG tube, could be related to Mallory-Weiss tear from multiple episodes of retching  Mechanical heart valve, was on Coumadin, elevated INR of 4, now 2.9 after 3 units FFP transfusion  Plan: Abdominal x-ray with small bowel protocol has been ordered, results pending. Surgery on board.  I would recommend vitamin K 10 mg IV x 1 dose to keep INR less than 2.  If needed we can consider using IV heparin for mechanical heart valve.  He is on IV pressors but not tachycardic.  I will increase his Protonix, he is to receive 80 mg IV x 1 followed by 40 mg every 6 hours for 72 hours and thereafter 40 mg every 12 hours.  Hopefully small bowel obstruction will resolve with conservative management, IV fluids and NG tube decompression.  If hemoglobin continues to trend down, we may have to consider EGD,  however will need INR to be less than 2 for any therapeutic intervention.  This was discussed in details with the patient's wife at bedside and his son at bedside as well as the patient's nurse.   LOS: 1 day   Kerin Salen, MD  03/04/2023, 10:31 AM

## 2023-03-04 NOTE — Progress Notes (Signed)
Subjective/Chief Complaint: Resting comfortably this morning.  Neo started overnight.  Son is at bedside.   Objective: Vital signs in last 24 hours: Temp:  [98.3 F (36.8 C)-101.5 F (38.6 C)] 99 F (37.2 C) (11/18 0730) Pulse Rate:  [66-151] 79 (11/18 0730) Resp:  [10-32] 19 (11/18 0730) BP: (65-131)/(37-96) 100/51 (11/18 0730) SpO2:  [90 %-100 %] 100 % (11/18 0730) Weight:  [79.3 kg] 79.3 kg (11/17 1320) Last BM Date : 03/03/23 (PTA)  Intake/Output from previous day: 11/17 0701 - 11/18 0700 In: 6897.4 [I.V.:2091.2; Blood:632.5; NG/GT:360; IV Piggyback:3813.7] Out: 4350 [Urine:1600; Emesis/NG output:2450] Intake/Output this shift: No intake/output data recorded.  Sleeping, difficult to awaken Unlabored respirations Regular rhythm, heart rate currently in the 80s Abdomen is soft, mildly distended, completely nontender this morning.  NG tube in place with copious dark bilious output, 2.5 L recorded  Lab Results:  Recent Labs    03/03/23 1922 03/04/23 0012 03/04/23 0242  WBC 33.7*  --  23.0*  HGB 11.6* 9.3* 8.5*  HCT 33.6* 26.8* 23.8*  PLT 155  --  128*   BMET Recent Labs    03/03/23 1922 03/04/23 0242  NA 126* 125*  K 4.2 4.4  CL 89* 90*  CO2 24 28  GLUCOSE 150* 151*  BUN 49* 46*  CREATININE 1.56* 1.55*  CALCIUM 8.2* 8.1*   PT/INR Recent Labs    03/03/23 1922 03/04/23 0242  LABPROT 40.2* 30.3*  INR 4.1* 2.9*   ABG Recent Labs    03/03/23 1922  HCO3 31.6*    Studies/Results: DG CHEST PORT 1 VIEW  Result Date: 03/03/2023 CLINICAL DATA:  NG tube placement EXAM: PORTABLE CHEST 1 VIEW COMPARISON:  03/03/2023 FINDINGS: NG tube is in the stomach. Prior median sternotomy and valve replacement. Heart and mediastinal contours within normal limits. No confluent airspace opacities or effusions. Minimal bibasilar opacities, favor atelectasis. No acute bony abnormality. IMPRESSION: NG tube in the stomach. Bibasilar atelectasis. Electronically Signed    By: Charlett Nose M.D.   On: 03/03/2023 21:37   DG Chest Port 1 View  Result Date: 03/03/2023 CLINICAL DATA:  Nausea and vomiting. EXAM: PORTABLE CHEST 1 VIEW COMPARISON:  03/16/2021. FINDINGS: Cardiac silhouette appears prominent. No pneumonia or pulmonary edema. No pneumothorax or pleural effusion. NG tube tip is at the epigastric region and side port is overlying distal esophagus. The tube should be advanced about 7 cm. IMPRESSION: Enlarged cardiac silhouette. Lungs are clear. NG tube should be advanced. Electronically Signed   By: Layla Maw M.D.   On: 03/03/2023 18:24   DG Abd Portable 1 View  Result Date: 03/03/2023 CLINICAL DATA:  Status post NG tube placement. EXAM: PORTABLE ABDOMEN - 1 VIEW COMPARISON:  CT abdomen and pelvis 03/03/2023 FINDINGS: An enteric tube has been placed with its tip overlying the distal esophagus near the GE junction. Gastric and small bowel distension were more fully evaluated on today's CT. A calcified gallstone and aortic valve replacement are noted. IMPRESSION: Enteric tube tip near the GE junction. Recommend advancement and obtaining a repeat radiograph to ensure intragastric placement. Electronically Signed   By: Sebastian Ache M.D.   On: 03/03/2023 12:42   CT ABDOMEN PELVIS WO CONTRAST  Result Date: 03/03/2023 CLINICAL DATA:  Abdominal pain, acute, nonlocalized EXAM: CT ABDOMEN AND PELVIS WITHOUT CONTRAST TECHNIQUE: Multidetector CT imaging of the abdomen and pelvis was performed following the standard protocol without IV contrast. RADIATION DOSE REDUCTION: This exam was performed according to the departmental dose-optimization program which includes automated  exposure control, adjustment of the mA and/or kV according to patient size and/or use of iterative reconstruction technique. COMPARISON:  04/14/2020 FINDINGS: Lower chest: Lung bases are clear.  Coronary artery atherosclerosis. Hepatobiliary: No focal liver abnormality identified on noncontrast images.  Markedly dilated gallbladder. 1.7 cm stone in the region of the gallbladder neck. No pericholecystic inflammatory changes by CT. No intrahepatic biliary dilatation. Pancreas: Unremarkable. No pancreatic ductal dilatation or surrounding inflammatory changes. Spleen: Normal in size without focal abnormality. Adrenals/Urinary Tract: Unremarkable adrenal glands. Bilateral renal cysts, which require no follow-up imaging. Kidneys otherwise unremarkable. No stone or hydronephrosis. Urinary bladder within normal limits. Stomach/Bowel: Fluid within the distal esophagus. Moderately dilated stomach. Multiple fluid-filled, dilated loops of small bowel throughout the abdomen with abrupt transition to decompressed bowel anteriorly within the central abdomen (series 2, image 54). There is fecalization of small bowel content immediately upstream to the transition site. Distal small bowel is decompressed. A small loop of distal small bowel extends into patient's right inguinal hernia. Normal appendix in the right lower quadrant. Colonic diverticulosis. No colonic wall thickening or inflammatory changes. Vascular/Lymphatic: Extensive aortoiliac atherosclerosis. No lymphadenopathy. Reproductive: Prostate gland within normal limits. Other: No ascites or pneumoperitoneum. Right inguinal hernia containing fat and distal small bowel. Musculoskeletal: No acute or significant osseous findings. IMPRESSION: 1. Small-bowel obstruction with abrupt transition to decompressed bowel anteriorly within the central abdomen, possibly secondary to adhesions. 2. Markedly dilated gallbladder with 1.7 cm stone in the region of the gallbladder neck. No pericholecystic inflammatory changes by CT. Correlate clinically for acute cholecystitis. 3. Right inguinal hernia containing fat and distal small bowel. 4. Colonic diverticulosis without evidence of acute diverticulitis. 5. Aortic atherosclerosis (ICD10-I70.0). These results were called by telephone at the  time of interpretation on 03/03/2023 at 10:26 am to provider Lorre Nick , who verbally acknowledged these results. Electronically Signed   By: Duanne Guess D.O.   On: 03/03/2023 10:27    Anti-infectives: Anti-infectives (From admission, onward)    None       Assessment/Plan: 77 year old man with partial small bowel obstruction and subsequent dehydration, severe metabolic derangements on presentation.  X-ray this morning with well-positioned NG tube and massively dilated stomach, some contrast noted.  Final read pending. -Continue NG tube decompression, -Fluid resuscitation, correct electrolytes -Will continue to follow exam-reassuringly nontender this morning  Additional problems present on admission: Leukocytosis-23 this morning from 33.7 yesterday, 21.7 on presentation A-fib/flutter with RVR and secondary hypotension Stress cardiomyopathy Acute kidney injury on chronic kidney disease-creatinine 1.55 (1.8 on presentation), BUN 46 (stable); lactate 2.2 stable Hyponatremia-125 today Delirium Possible upper GI bleed-continue to monitor, hemoglobin 8.5 this morning from 16 on initial presentation although suspect initial value was hemoconcentrated History of aortic valve replacement Memory issues Seizures Chronic anticoagulation Cholelithiasis Prior bilateral laparoscopic inguinal hernia repairs (2000), prior open sigmoid colectomy (2007)   history of A-fib/AVR previously on warfarin, history of sigmoid colectomy in 2007  LOS: 1 day    Berna Bue 03/04/2023

## 2023-03-04 NOTE — Consult Note (Signed)
Cardiology Consultation   Patient ID: Alan Willis MRN: [id number]; DOB: 13-Mar-1946  Admit date: 03/03/2023 Date of Consult: 03/04/2023  PCP:  Alan Hazel, MD   [facility] Providers Cardiologist:  None        Patient Profile:   Alan Willis is a 77 y.o. 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 Robb Matar.  History of Present Illness:   Alan Willis presented to the ED with abdominal pain since 11/16 along with abdominal distension, multiple episodes of emesis with coffee ground appearance, diarrhea with melena. Patient unable to tolerate daily medications since Saturday. In the ED, labs with leukocytosis to 21.7. Hemoglobin 16.2 platelets 181. Fecal occult blood test positive. Troponin 104->91. Lactic acid 2.2. CT abd/pelvis with SBO as well as dilated gallbladder. Upon admission, patient with wide complex QRS and hypotension, thought to be atrial flutter when reviewed by cardiology at that time.   Patient seen this morning, is now back in sinus rhythm with PACs/PVCs. No focal complaints including chest pain, palpitations, shortness of breath.   Patient followed closely by [facility], last seen 11/20/22. Recently maintained on Tikosyn q12hr, Lasix 40mg  daily, Spironolactone 12.5mg  BID, Warfarin.    Past Medical History:  Diagnosis Date   A-fib [facility])    Atrial flutter with rapid ventricular response (HCC) 03/16/2021   Cholelithiasis 2011   Chronic anticoagulation 03/03/2023   Colon, diverticulosis    H/O heart valve replacement with mechanical valve 03/03/2023   Bjork-Shiley tilting disc  valve in 1983 at [facility] in [place], [place]        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 Authorizing Provider  Cholecalciferol (VITAMIN D3) 1000 units CAPS Take 1,000 Units by mouth daily.   Yes [provider]  dofetilide (TIKOSYN) 250 MCG capsule Take 250 mcg by mouth every 12 (twelve) hours. 10/25/21  Yes [provider]  doxycycline (VIBRA-TABS) 100 MG tablet Take 100 mg by mouth See admin instructions. Take 100 mg by mouth 30-60 minutes prior to dental procedures 05/25/22  Yes [provider]  ferrous sulfate 325 (65 FE) MG tablet Take 325 mg by mouth daily with breakfast.   Yes [provider]  furosemide (LASIX) 40 MG tablet Take 40 mg by mouth in the morning. 09/05/21  Yes [provider]  IMODIUM A-D 2 MG tablet Take 2 mg by mouth 4 (four) times daily as needed for diarrhea or loose stools.   Yes [provider]  levETIRAcetam (KEPPRA) 500 MG tablet Take 1 tablet in AM and 1 and 1/2 tablets in PM Patient taking differently: Take 500-750 mg by mouth See admin instructions. Take 500 mg by mouth in the morning and 750 mg in  the evening 12/31/22  Yes Van Clines, MD  magnesium oxide (MAG-OX) 400 (240 Mg) MG tablet Take 400 mg by mouth daily.   Yes [provider]  spironolactone (ALDACTONE) 25 MG tablet Take 12.5 mg by mouth in the morning. 10/24/21  Yes [provider]  TYLENOL 500 MG tablet Take 500 mg by mouth every 6 (six) hours as needed for mild  pain (pain score 1-3) or headache.   Yes [provider]  warfarin (COUMADIN) 6 MG tablet Take 3-6 mg by mouth See admin instructions. Take 6 mg by mouth in the morning on Sun/Mon/Wed/Thurs/Fri/Sat and 3 mg on Tues   Yes [provider]    Inpatient Medications: Scheduled Meds:  sodium chloride   Intravenous Once   Chlorhexidine Gluconate Cloth  6 each Topical Daily   lidocaine  1 Application Topical Once   mouth rinse  15 mL Mouth Rinse 4 times per day   pantoprazole (PROTONIX) IV  40 mg Intravenous Q6H   Followed by   Melene Muller ON 03/07/2023] pantoprazole (PROTONIX) IV  40 mg Intravenous Q12H   Continuous Infusions:  sodium chloride Stopped (03/04/23 0322)   albumin human Stopped (03/04/23 0700)   lactated ringers     lactated ringers 100 mL/hr at 03/04/23 1100   levETIRAcetam Stopped (03/04/23 0945)   And   levETIRAcetam Stopped (03/03/23 2332)   ondansetron (ZOFRAN) IV     phenylephrine (NEO-SYNEPHRINE) Adult infusion 25 mcg/min (03/04/23 1100)   PRN Meds: acetaminophen, alum & mag hydroxide-simeth, bisacodyl, fentaNYL (SUBLIMAZE) injection, lactated ringers, magic mouthwash, menthol-cetylpyridinium, methocarbamol (ROBAXIN) injection, naphazoline-glycerin, ondansetron (ZOFRAN) IV **OR** ondansetron (ZOFRAN) IV, mouth rinse, phenol, prochlorperazine, sodium chloride  Allergies:    Allergies  Allergen Reactions   Amoxicillin-Pot Clavulanate Rash   Codeine Other (See Comments)    Stomach upset    Social History:   Social History   Socioeconomic History   Marital status: Married    Spouse name: Not on file   Number of children: Not on file   Years of education: Not on file   Highest education level: Not on file  Occupational History   Not on file  Tobacco Use   Smoking status: Some Days    Types: Cigarettes, Cigars   Smokeless tobacco: Never  Vaping Use   Vaping status: Never Used  Substance and Sexual Activity   Alcohol use: Not Currently     Comment: daily   Drug use: Never   Sexual activity: Not on file  Other Topics Concern   Not on file  Social History Narrative   Right handed    Lives with family    Retired    Drinks a etoh free beer a day    Social Determinants of Corporate investment banker Strain: Not on file  Food Insecurity: No Food Insecurity (03/03/2023)   Hunger Vital Sign    Worried About Running Out of Food in the Last Year: Never true    Ran Out of Food in the Last Year: Never true  Transportation Needs: No Transportation Needs (03/03/2023)   PRAPARE - Administrator, Civil Service (Medical): No    Lack of Transportation (Non-Medical): No  Physical Activity: Not on file  Stress: Not on file  Social Connections: Not on file  Intimate Partner Violence: Not At Risk (03/03/2023)   Humiliation, Afraid, Rape, and Kick questionnaire    Fear of Current or Ex-Partner: No    Emotionally Abused: No    Physically  Abused: No    Sexually Abused: No    Family History:   History reviewed. No pertinent family history.   ROS:  Please see the history of present illness.   All other ROS reviewed and negative.     Physical Exam/Data:   Vitals:   03/04/23 1100 03/04/23 1103 03/04/23 1115 03/04/23 1118  BP: (!) 95/49 (!) 95/49 (!) 107/38 (!) 95/44  Pulse: 83 80 81 78  Resp: 19 (!) 23 17 19   Temp: 99.3 F (37.4 C) 99.3 F (37.4 C) 99.3 F (37.4 C) 99.3 F (37.4 C)  TempSrc: Bladder Bladder    SpO2: 98% 99% 98% 98%  Weight:      Height:        Intake/Output Summary (Last 24 hours) at 03/04/2023 1136 Last data filed at 03/04/2023 1102 Gross per 24 hour  Intake 7565.28 ml  Output 6000 ml  Net 1565.28 ml      03/03/2023    1:20 PM 03/03/2023    8:13 AM 12/19/2022    7:55 AM  Last 3 Weights  Weight (lbs) 174 lb 13.2 oz 175 lb 176 lb 12.8 oz  Weight (kg) 79.3 kg 79.379 kg 80.196 kg     Body mass index is 25.08 kg/m.  General:  Ill appearing but in no acute distress HEENT:  normal Neck: no JVD Vascular: No carotid bruits; Distal pulses 2+ bilaterally Cardiac:  normal S1, S2; slightly irregular rhythm; 4/6 systolic murmur with crisp mechanical valve closure sound. Lungs:  clear to auscultation bilaterally, no wheezing, rhonchi or rales  Abd: absent bowel sounds Ext: no edema Musculoskeletal:  No deformities, BUE and BLE strength normal and equal Skin: warm and dry  Neuro:  CNs 2-12 intact, no focal abnormalities noted Psych:  Normal affect   EKG:  The EKG was personally reviewed and demonstrates:  atrial flutter with 2:1 conduction, Ventricular rate 136. Telemetry:  Telemetry was personally reviewed and demonstrates:  atrial flutter with conversion to NSR with PACs/PVCs around 1:50am on 11/18.   Relevant CV Studies:  07/16/22 TTE  SUMMARY  Left ventricular systolic function is normal.  LV ejection fraction = 55-60%.  No segmental wall motion abnormalities seen in the left ventricle  Moderate left ventricular hypertrophy  The right ventricle is normal in size and function.  The left atrium is severely dilated.  The right atrium is mildly dilated.  There is a mechanical aortic valve.  Aortic valve mean pressure gradient is 24 mmHg.  There is trace aortic regurgitation.  There is mild mitral stenosis.  There is moderate mitral regurgitation.  Estimated right ventricular systolic pressure is 30-35 mmHg.  There is no pericardial effusion.  Compared to the last study dated 06-13-2021, there is no interval change in LV function or mitral  valve function. The aortic valve mean gradient appears a bit higher today  now 24 mmHg, previously  14 mmHg .  -  FINDINGS  LEFT VENTRICLE  The left ventricle is moderately dilated. Moderate left ventricular hypertrophy. Left ventricular  systolic function is normal. LV ejection fraction = 55-60%. Left ventricular diastolic function and  atrial pressure are indeterminate due to  mitral stenosis . No segmental wall  motion abnormalities  seen in the left ventricle.  LV WALL MOTION  -  RIGHT VENTRICLE  The right ventricle is normal in size and function.  LEFT ATRIUM  The left atrium is severely dilated.  RIGHT ATRIUM  The right atrium is mildly dilated. There is no Doppler evidence  for a patent foramen ovale.  -  AORTIC VALVE  s-p AVR. The peak aortic valve velocity 321 cm-s. Aortic valve mean pressure gradient is 24 mmHg. AS  dimensionless index  VTI  is 0.56. There is trace aortic regurgitation. There is a mechanical aortic  valve.  -  MITRAL VALVE  Diffuse thickening of the mitral leaftlet with restricted leaflet opening. Focally calcified mitral  leaflet. There is mild mitral stenosis. The mean gradient across the mitral valve is 5 mmHg. The  heart rate for the mean mitral valve gradient is 50 BPM. The mitral valve area by pressure halftime  is 1.33 cm sq. There is moderate mitral regurgitation.  -  TRICUSPID VALVE  Structurally normal tricuspid valve. There is trace tricuspid regurgitation. There is no tricuspid  stenosis. Estimated right ventricular systolic pressure is 30-35 mmHg.  -  PULMONIC VALVE  The pulmonic valve is not well visualized. Trace pulmonic valvular regurgitation. There is no  pulmonic valvular stenosis.  -  ARTERIES  The aortic sinus is normal size. The ascending aorta is normal size.  -  VENOUS  IVC size was mildly dilated. Right atrial pressure is estimated to be 8 mm Hg.  -  EFFUSION  There is no pericardial effusion.   Laboratory Data:  High Sensitivity Troponin:   Recent Labs  Lab 03/03/23 0843 03/03/23 1048  TROPONINIHS 104* 91*     Chemistry Recent Labs  Lab 03/03/23 1404 03/03/23 1922 03/04/23 0242  NA 126* 126* 125*  K 4.4 4.2 4.4  CL 84* 89* 90*  CO2 28 24 28   GLUCOSE 146* 150* 151*  BUN 50* 49* 46*  CREATININE 1.76* 1.56* 1.55*  CALCIUM 9.0 8.2* 8.1*  MG 2.0  --   --   GFRNONAA 40* 46* 46*  ANIONGAP 14 13 7     Recent Labs  Lab  03/03/23 0843 03/04/23 0242  PROT 8.0 5.6*  ALBUMIN 4.8 3.6  AST 26 26  ALT 13 13  ALKPHOS 52 30*  BILITOT 2.5* 2.3*   Lipids No results for input(s): "CHOL", "TRIG", "HDL", "LABVLDL", "LDLCALC", "CHOLHDL" in the last 168 hours.  Hematology Recent Labs  Lab 03/03/23 0843 03/03/23 1404 03/03/23 1922 03/04/23 0012 03/04/23 0242  WBC 21.7*  --  33.7*  --  23.0*  RBC 5.27  --  3.65*  --  2.62*  HGB 16.2   < > 11.6* 9.3* 8.5*  HCT 45.9   < > 33.6* 26.8* 23.8*  MCV 87.1  --  92.1  --  90.8  MCH 30.7  --  31.8  --  32.4  MCHC 35.3  --  34.5  --  35.7  RDW 13.5  --  13.6  --  13.7  PLT 181  --  155  --  128*   < > = values in this interval not displayed.   Thyroid No results for input(s): "TSH", "FREET4" in the last 168 hours.  BNPNo results for input(s): "BNP", "PROBNP" in the last 168 hours.  DDimer No results for input(s): "DDIMER" in the last 168 hours.   Radiology/Studies:  DG CHEST PORT 1 VIEW  Result Date: 03/03/2023 CLINICAL DATA:  NG tube placement EXAM: PORTABLE CHEST 1 VIEW COMPARISON:  03/03/2023 FINDINGS: NG tube is in the stomach. Prior median sternotomy and valve replacement. Heart and mediastinal contours within normal limits. No confluent airspace opacities or effusions. Minimal bibasilar opacities, favor atelectasis. No acute bony abnormality. IMPRESSION: NG tube in the stomach. Bibasilar atelectasis. Electronically Signed  By: Charlett Nose M.D.   On: 03/03/2023 21:37   DG Chest Port 1 View  Result Date: 03/03/2023 CLINICAL DATA:  Nausea and vomiting. EXAM: PORTABLE CHEST 1 VIEW COMPARISON:  03/16/2021. FINDINGS: Cardiac silhouette appears prominent. No pneumonia or pulmonary edema. No pneumothorax or pleural effusion. NG tube tip is at the epigastric region and side port is overlying distal esophagus. The tube should be advanced about 7 cm. IMPRESSION: Enlarged cardiac silhouette. Lungs are clear. NG tube should be advanced. Electronically Signed   By: Layla Maw M.D.   On: 03/03/2023 18:24   DG Abd Portable 1 View  Result Date: 03/03/2023 CLINICAL DATA:  Status post NG tube placement. EXAM: PORTABLE ABDOMEN - 1 VIEW COMPARISON:  CT abdomen and pelvis 03/03/2023 FINDINGS: An enteric tube has been placed with its tip overlying the distal esophagus near the GE junction. Gastric and small bowel distension were more fully evaluated on today's CT. A calcified gallstone and aortic valve replacement are noted. IMPRESSION: Enteric tube tip near the GE junction. Recommend advancement and obtaining a repeat radiograph to ensure intragastric placement. Electronically Signed   By: Sebastian Ache M.D.   On: 03/03/2023 12:42   CT ABDOMEN PELVIS WO CONTRAST  Result Date: 03/03/2023 CLINICAL DATA:  Abdominal pain, acute, nonlocalized EXAM: CT ABDOMEN AND PELVIS WITHOUT CONTRAST TECHNIQUE: Multidetector CT imaging of the abdomen and pelvis was performed following the standard protocol without IV contrast. RADIATION DOSE REDUCTION: This exam was performed according to the departmental dose-optimization program which includes automated exposure control, adjustment of the mA and/or kV according to patient size and/or use of iterative reconstruction technique. COMPARISON:  04/14/2020 FINDINGS: Lower chest: Lung bases are clear.  Coronary artery atherosclerosis. Hepatobiliary: No focal liver abnormality identified on noncontrast images. Markedly dilated gallbladder. 1.7 cm stone in the region of the gallbladder neck. No pericholecystic inflammatory changes by CT. No intrahepatic biliary dilatation. Pancreas: Unremarkable. No pancreatic ductal dilatation or surrounding inflammatory changes. Spleen: Normal in size without focal abnormality. Adrenals/Urinary Tract: Unremarkable adrenal glands. Bilateral renal cysts, which require no follow-up imaging. Kidneys otherwise unremarkable. No stone or hydronephrosis. Urinary bladder within normal limits. Stomach/Bowel: Fluid within the  distal esophagus. Moderately dilated stomach. Multiple fluid-filled, dilated loops of small bowel throughout the abdomen with abrupt transition to decompressed bowel anteriorly within the central abdomen (series 2, image 54). There is fecalization of small bowel content immediately upstream to the transition site. Distal small bowel is decompressed. A small loop of distal small bowel extends into patient's right inguinal hernia. Normal appendix in the right lower quadrant. Colonic diverticulosis. No colonic wall thickening or inflammatory changes. Vascular/Lymphatic: Extensive aortoiliac atherosclerosis. No lymphadenopathy. Reproductive: Prostate gland within normal limits. Other: No ascites or pneumoperitoneum. Right inguinal hernia containing fat and distal small bowel. Musculoskeletal: No acute or significant osseous findings. IMPRESSION: 1. Small-bowel obstruction with abrupt transition to decompressed bowel anteriorly within the central abdomen, possibly secondary to adhesions. 2. Markedly dilated gallbladder with 1.7 cm stone in the region of the gallbladder neck. No pericholecystic inflammatory changes by CT. Correlate clinically for acute cholecystitis. 3. Right inguinal hernia containing fat and distal small bowel. 4. Colonic diverticulosis without evidence of acute diverticulitis. 5. Aortic atherosclerosis (ICD10-I70.0). These results were called by telephone at the time of interpretation on 03/03/2023 at 10:26 am to provider Lorre Nick , who verbally acknowledged these results. Electronically Signed   By: Duanne Guess D.O.   On: 03/03/2023 10:27     Assessment and Plan:  Paroxysmal Atrial flutter with RVR  Patient admitted with SBO, inability to tolerate oral meds since 11/16. Was previously taking Tikosyn following multiple cardioversions for atrial flutter (most recently June 2023). Is followed by [facility] EP team in addition to general cardiology.   Patient back in NSR this morning.  Recommend close follow up with outpatient EP team for consideration of Tikosyn re-load vs ablation. Soft BP preclude use of AV nodal agents. If patient were to have significant recurrence of atrial flutter with RVR, Amiodarone really only option (not ideal as this would significantly delay potential transition back to Tikosyn due to long half life).  Heparin bridging as able until safe to resume Warfarin. Currently receiving PRBCs and not anti-coagulated.  S/P mechanical AV  TTE in April with mean gradient 24 mmHg (up slightly from 14 previously), peak velocity 3.2 cm/ s, VTI 0.56.   Continue Heparin bridging until able to resume Warfarin.  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 Continue outpatient monitoring.   SBO UGIB  Per primary team.  Risk Assessment/Risk Scores:          CHA2DS2-VASc Score = 5   This indicates a 7.2% annual risk of stroke. The patient's score is based upon: CHF History: 1 HTN History: 0 Diabetes History: 0 Stroke History: 2 Vascular Disease History: 0 Age Score: 2 Gender Score: 0         For questions or updates, please contact [facility] Please consult [email or web address] for contact info under    Signed, Quantay Trbovich, PA-C  03/04/2023 11:36 AM

## 2023-03-04 NOTE — Plan of Care (Signed)
  Problem: Education: Goal: Knowledge of General Education information will improve Description: Including pain rating scale, medication(s)/side effects and non-pharmacologic comfort measures Outcome: Progressing   Problem: Coping: Goal: Level of anxiety will decrease Outcome: Progressing   Problem: Pain Management: Goal: General experience of comfort will improve Outcome: Progressing

## 2023-03-05 ENCOUNTER — Encounter (HOSPITAL_COMMUNITY): Payer: Self-pay | Admitting: *Deleted

## 2023-03-05 ENCOUNTER — Inpatient Hospital Stay (HOSPITAL_COMMUNITY): Payer: Medicare Other | Admitting: Anesthesiology

## 2023-03-05 ENCOUNTER — Encounter (HOSPITAL_COMMUNITY)
Admission: EM | Disposition: A | Discharge status: Home Health | Payer: Self-pay | Source: Home / Self Care | Attending: Family Medicine

## 2023-03-05 DIAGNOSIS — K259 Gastric ulcer, unspecified as acute or chronic, without hemorrhage or perforation: Secondary | ICD-10-CM

## 2023-03-05 DIAGNOSIS — K3189 Other diseases of stomach and duodenum: Secondary | ICD-10-CM

## 2023-03-05 DIAGNOSIS — K56609 Unspecified intestinal obstruction, unspecified as to partial versus complete obstruction: Secondary | ICD-10-CM | POA: Diagnosis not present

## 2023-03-05 DIAGNOSIS — R579 Shock, unspecified: Secondary | ICD-10-CM | POA: Diagnosis not present

## 2023-03-05 DIAGNOSIS — K922 Gastrointestinal hemorrhage, unspecified: Secondary | ICD-10-CM | POA: Diagnosis not present

## 2023-03-05 HISTORY — PX: HEMOSTASIS CLIP PLACEMENT: SHX6857

## 2023-03-05 HISTORY — PX: ESOPHAGOGASTRODUODENOSCOPY (EGD) WITH PROPOFOL: SHX5813

## 2023-03-05 HISTORY — PX: BIOPSY: SHX5522

## 2023-03-05 LAB — CBC
HCT: 22.5 % — ABNORMAL LOW (ref 39.0–52.0)
HCT: 24.4 % — ABNORMAL LOW (ref 39.0–52.0)
Hemoglobin: 7.4 g/dL — ABNORMAL LOW (ref 13.0–17.0)
Hemoglobin: 8.3 g/dL — ABNORMAL LOW (ref 13.0–17.0)
MCH: 31 pg (ref 26.0–34.0)
MCH: 31.7 pg (ref 26.0–34.0)
MCHC: 32.9 g/dL (ref 30.0–36.0)
MCHC: 34 g/dL (ref 30.0–36.0)
MCV: 94.1 fL (ref 80.0–100.0)
MCV: 93.1 fL (ref 80.0–100.0)
Platelets: 89 10*3/uL — ABNORMAL LOW (ref 150–400)
Platelets: 73 10*3/uL — ABNORMAL LOW (ref 150–400)
RBC: 2.39 MIL/uL — ABNORMAL LOW (ref 4.22–5.81)
RBC: 2.62 MIL/uL — ABNORMAL LOW (ref 4.22–5.81)
RDW: 14.1 % (ref 11.5–15.5)
RDW: 13.7 % (ref 11.5–15.5)
WBC: 11.1 10*3/uL — ABNORMAL HIGH (ref 4.0–10.5)
WBC: 8.3 10*3/uL (ref 4.0–10.5)
nRBC: 0 % (ref 0.0–0.2)
nRBC: 0 % (ref 0.0–0.2)

## 2023-03-05 LAB — PREPARE FRESH FROZEN PLASMA
Unit division: 0
Unit division: 0

## 2023-03-05 LAB — BPAM FFP
Blood Product Expiration Date: 202411222359
Blood Product Expiration Date: 202411222359
Blood Product Expiration Date: 202411232359
ISSUE DATE / TIME: 202411172357
ISSUE DATE / TIME: 202411180141
ISSUE DATE / TIME: 202411180401
Unit Type and Rh: 5100
Unit Type and Rh: 5100
Unit Type and Rh: 5100
Unit Type and Rh: 5100

## 2023-03-05 LAB — BASIC METABOLIC PANEL
Anion gap: 5 (ref 5–15)
BUN: 35 mg/dL — ABNORMAL HIGH (ref 8–23)
CO2: 28 mmol/L (ref 22–32)
Calcium: 8 mg/dL — ABNORMAL LOW (ref 8.9–10.3)
Chloride: 99 mmol/L (ref 98–111)
Creatinine, Ser: 1.34 mg/dL — ABNORMAL HIGH (ref 0.61–1.24)
GFR, Estimated: 55 mL/min — ABNORMAL LOW (ref >60)
Glucose, Bld: 92 mg/dL (ref 70–99)
Potassium: 4 mmol/L (ref 3.5–5.1)
Sodium: 132 mmol/L — ABNORMAL LOW (ref 135–145)

## 2023-03-05 LAB — PROTIME-INR
INR: 1.4 — ABNORMAL HIGH (ref 0.8–1.2)
Prothrombin Time: 17.5 s — ABNORMAL HIGH (ref 11.4–15.2)

## 2023-03-05 LAB — HEMOGLOBIN AND HEMATOCRIT, BLOOD
HCT: 22.6 % — ABNORMAL LOW (ref 39.0–52.0)
HCT: 23.1 % — ABNORMAL LOW (ref 39.0–52.0)
Hemoglobin: 7.4 g/dL — ABNORMAL LOW (ref 13.0–17.0)
Hemoglobin: 7.5 g/dL — ABNORMAL LOW (ref 13.0–17.0)

## 2023-03-05 LAB — HEPARIN LEVEL (UNFRACTIONATED): Heparin Unfractionated: 0.11 [IU]/mL — ABNORMAL LOW (ref 0.30–0.70)

## 2023-03-05 LAB — MAGNESIUM: Magnesium: 2.3 mg/dL (ref 1.7–2.4)

## 2023-03-05 LAB — PREPARE RBC (CROSSMATCH)

## 2023-03-05 SURGERY — ESOPHAGOGASTRODUODENOSCOPY (EGD) WITH PROPOFOL
Anesthesia: Monitor Anesthesia Care

## 2023-03-05 MED ORDER — ACETAMINOPHEN 325 MG PO TABS
650.0000 mg | ORAL_TABLET | Freq: Four times a day (QID) | ORAL | Status: DC | PRN
  Administered 2023-03-05 – 2023-03-16 (×6): 650 mg via ORAL
  Filled 2023-03-05 (×6): qty 2

## 2023-03-05 MED ORDER — LACTATED RINGERS IV BOLUS
500.0000 mL | Freq: Once | INTRAVENOUS | Status: AC
  Administered 2023-03-05: 500 mL via INTRAVENOUS

## 2023-03-05 MED ORDER — LIDOCAINE HCL (CARDIAC) PF 100 MG/5ML IV SOSY
PREFILLED_SYRINGE | INTRAVENOUS | Status: DC | PRN
  Administered 2023-03-05: 50 mg via INTRAVENOUS

## 2023-03-05 MED ORDER — HALOPERIDOL LACTATE 5 MG/ML IJ SOLN
5.0000 mg | Freq: Four times a day (QID) | INTRAMUSCULAR | Status: AC | PRN
  Administered 2023-03-05 – 2023-03-10 (×2): 5 mg via INTRAVENOUS
  Filled 2023-03-05 (×2): qty 1

## 2023-03-05 MED ORDER — ORAL CARE MOUTH RINSE: 15.0000 mL | OROMUCOSAL | Status: DC | PRN

## 2023-03-05 MED ORDER — SODIUM CHLORIDE 0.9 % IV SOLN: INTRAVENOUS | Status: DC

## 2023-03-05 MED ORDER — DOFETILIDE 250 MCG PO CAPS
250.0000 ug | ORAL_CAPSULE | Freq: Two times a day (BID) | ORAL | Status: DC
  Administered 2023-03-05 – 2023-03-13 (×16): 250 ug via ORAL
  Filled 2023-03-05 (×18): qty 1

## 2023-03-05 MED ORDER — SODIUM CHLORIDE 0.9% IV SOLUTION: Freq: Once | INTRAVENOUS | Status: AC

## 2023-03-05 MED ORDER — HEPARIN (PORCINE) 25000 UT/250ML-% IV SOLN
1200.0000 [IU]/h | INTRAVENOUS | Status: DC
  Administered 2023-03-05: 1000 [IU]/h via INTRAVENOUS
  Filled 2023-03-05: qty 250

## 2023-03-05 MED ORDER — PROPOFOL 500 MG/50ML IV EMUL
INTRAVENOUS | Status: AC
  Filled 2023-03-05: qty 50

## 2023-03-05 MED ORDER — PROPOFOL 10 MG/ML IV BOLUS
INTRAVENOUS | Status: DC | PRN
  Administered 2023-03-05: 280 mg via INTRAVENOUS

## 2023-03-05 MED ORDER — HALOPERIDOL LACTATE 5 MG/ML IJ SOLN
INTRAMUSCULAR | Status: AC
  Filled 2023-03-05: qty 1

## 2023-03-05 MED ORDER — SODIUM CHLORIDE 0.9 % IV SOLN: INTRAVENOUS | Status: DC | PRN

## 2023-03-05 SURGICAL SUPPLY — 14 items

## 2023-03-05 NOTE — Progress Notes (Addendum)
NAME:  Alan Willis, MRN:  [id number], DOB:  October 19, 1945, LOS: 2 ADMISSION DATE:  03/03/2023, CONSULTATION DATE:  03/03/23 REFERRING MD:  Robb Matar, CHIEF COMPLAINT:  hematemesis   History of Present Illness:  77 year old man w/ hx of afib, AVR on warfarin p/w anorexia, abd pain followed by hematemesis.  Workup has revealed SBO.  Has been in Afib vs flutter with borderline Bps.  PCCM consulted to assist with management.  Overall patient feels tired but better than admit.  Some ongoing abd pain and mild SOB.  Pertinent  Medical History   Past Medical History:  Diagnosis Date   A-fib [facility])    Atrial flutter with rapid ventricular response (HCC) 03/16/2021   Cholelithiasis 2011   Chronic anticoagulation 03/03/2023   Colon, diverticulosis    H/O heart valve replacement with mechanical valve 03/03/2023   Bjork-Shiley tilting disc valve in 1983 at [facility] in [place], [place].        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   Stricture of sigmoid colon s/p colectomy 2007 2007   Significant Hospital Events: Including procedures, antibiotic start and stop dates in addition to other pertinent events   11/17 admit; CCS/GI/Cards/PCCM consulted 11/18 Hgb further dropped tp 8.5 from 11.6 with dark NG output seen   Interim History / Subjective:  Resting comfortably in bed No complaints Passing gas, BM yesterday per patient  Objective   Blood pressure (!) 113/46, pulse (!) 57, temperature 99 F (37.2 C), resp. rate 18, height 5\' 10"  (1.778 m), weight 79.3 kg, SpO2 100%.        Intake/Output Summary (Last 24 hours) at 03/05/2023 0807 Last data filed at 03/05/2023 0700 Gross per 24 hour  Intake 2965.76 ml  Output 3775 ml  Net -809.24 ml   Filed Weights   03/03/23 0813 03/03/23 1320  Weight: 79.4 kg 79.3 kg    Examination: General: elderly appearing male in NAD HEENT: [place]/AT, PERRL, no JVD Neuro: Alert and oriented x 3 CV: RRR, no MRG PULM:   Clear bilateral breath sounds GI: soft, non-tender, high pitched normal frequency bowel sounds in all four quadrants. NG output appears bilious.  Extremities: warm/dry, no edema  Skin: no rashes or lesions  Resolved Hospital Problem list   N/A  Assessment & Plan:   SIRS-type response, multifactorial  -with Afib/flutter w/ RVR, relative hypotension in the setting of GI bleed, hyponatremia, AKI, stress cardiomyopathy: all related to dehydration and SBO; no real signs of infection at this time; abd is not acute P: Supplemental oxygen as needed  Monitor off antibiotics  Remains on low dose neo, attempt to wean Hopefully will get the go-ahead to add oral hydration today.  Monitor urine output  Small bowel obstruction UGIB ABLA, thromobcytopenia - Likely related to obstruction but unable to rule out possible ulcer  - Transfused 11/18 1 unit PRBC P: GI following - increased PPI. Hoping for improvement with conservative management.  Potential endoscopy NG tube decompression Trend CBC  NPO  Trend CBC  Mechanical aortic valve replacement  Chronic anticoagulation with Coumadin  Supratherapeutic INR  Chronic A-flutter with A-fib RVR on admission  P:  Cardiology following - currently sinus in the 60s.  Dofetilide on hold Tikosyn washout  If AF is an issue amio is the only option, but avoid if at all possible so dofetilide can be resumed prior to discharge. Holding warfarin/anticoagulation with GI bleed.  INR now below 2, will need to consider heparin bridge.  Trend INR   Delirium  Seizures Memory issues P: Delirium precautions  Nutrition and bowel regimen Keppra Seizure precautions  Aspirations precautions    Best Practice (right click and "Reselect all SmartList Selections" daily)  Per primary  Critical care time:  38 minutes    Joneen Roach, AGACNP-BC [facility]  See Amion for personal pager PCCM on call pager [phone] until  7pm. Please call Elink 7p-7a. [phone]  03/05/2023 8:12 AM

## 2023-03-05 NOTE — TOC Initial Note (Addendum)
Transition of Care [facility]) - Initial/Assessment Note    Patient Details  Name: Alan Willis MRN: [id number] Date of Birth: 15-Jan-1946  Transition of [facility]) CM/SW Contact:    Lavenia Atlas, RN Phone Number: 03/05/2023, 1:00 PM  Clinical Narrative:    Per chart review patient currently in [facility] ICU. Prior to admission patient wears hearing aide, and has no home health or DME services.   Transportation at discharge: wife  TOC will continue to follow for discharge needs.                Expected Discharge Plan: Home/Self Care Barriers to Discharge: Continued Medical Work up   Patient Goals and CMS Choice Patient states their goals for this hospitalization and ongoing recovery are:: to feel better CMS Medicare.gov Compare Post Acute Care list provided to:: Patient Represenative (must comment) (Spouse: [name]) Choice offered to / list presented to : NA [place] ownership interest in [facility].provided to::  (N/A)    Expected Discharge Plan and Services In-house Referral: NA Discharge Planning Services: CM Consult Post Acute Care Choice: NA Living arrangements for the past 2 months: Single Family Home                 DME Arranged: N/A DME Agency: NA       HH Arranged: NA HH Agency: NA        Prior Living Arrangements/Services Living arrangements for the past 2 months: Single Family Home Lives with:: Spouse Patient language and need for interpreter reviewed:: Yes Do you feel safe going back to the place where you live?: Yes      Need for Family Participation in Patient Care: No (Comment) Care giver support system in place?: Yes (comment) Current home services: Other (comment) (None) Criminal Activity/Legal Involvement Pertinent to Current Situation/Hospitalization: No - Comment as needed  Activities of Daily Living   ADL Screening (condition at time of admission) Independently performs ADLs?: Yes (appropriate for developmental age) Is  the patient deaf or have difficulty hearing?: Yes Does the patient have difficulty seeing, even when wearing glasses/contacts?: No Does the patient have difficulty concentrating, remembering, or making decisions?: Yes  Permission Sought/Granted Permission sought to share information with : Case Manager Permission granted to share information with : Yes, Verbal Permission Granted  Share Information with NAME: Case manager           Emotional Assessment Appearance:: Appears stated age Attitude/Demeanor/Rapport: Gracious Affect (typically observed): Accepting Orientation: : Oriented to Place, Oriented to Self, Oriented to  Time, Oriented to Situation Alcohol / Substance Use: Tobacco Use (cigarettes and cigars) Psych Involvement: No (comment)  Admission diagnosis:  Small bowel obstruction (HCC) [K56.609] GI bleed [K92.2] AKI (acute kidney injury) (HCC) [N17.9] Patient Active Problem List   Diagnosis Date Noted   Shock (HCC) 03/05/2023   GI bleed 03/03/2023   Chronic anticoagulation 03/03/2023   H/O heart valve replacement with mechanical valve 03/03/2023   Memory loss 03/03/2023   History of seizures 03/03/2023   Localization-related idiopathic epilepsy and epileptic syndromes with seizures of localized onset, not intractable, without status epilepticus (HCC) 03/03/2023   AKI (acute kidney injury) (HCC) 03/03/2023   Gallstone 03/03/2023   CKD (chronic kidney disease) stage 2, GFR 60-89 ml/min 03/03/2023   Elevated troponin 03/03/2023   Recurrent right inguinal hernia 03/03/2023   Abdominal aortic atherosclerosis (HCC) 03/03/2023   Atrial fibrillation with RVR (HCC) 03/03/2023   Diverticulosis of colon with hemorrhage 03/03/2023   Small bowel obstruction (  HCC) 03/03/2023   Mitral valve stenosis 07/14/2019   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 Mallie Mussel) 05/21/2011   PCP:  [name],  MD Pharmacy:   [facility] [phone] [facility] - [place], [place] - [address] [place] [address] Phone: [phone] Fax: [phone]     Social Determinants of Health (SDOH) Social History: SDOH Screenings   Food Insecurity: No Food Insecurity (03/03/2023)  Housing: Low Risk  (03/03/2023)  Transportation Needs: No Transportation Needs (03/03/2023)  Utilities: Not At Risk (03/03/2023)  Tobacco Use: High Risk (03/05/2023)   SDOH Interventions:     Readmission Risk Interventions    03/05/2023   12:52 PM  Readmission Risk Prevention Plan  Transportation Screening Complete  PCP or Specialist Appt within 3-5 Days Complete  HRI or Home Care Consult Complete  Social Work Consult for Recovery Care Planning/Counseling Complete  Palliative Care Screening Not Applicable  Medication Review Oceanographer) Complete

## 2023-03-05 NOTE — Transfer of Care (Signed)
Immediate Anesthesia Transfer of Care Note  Patient: Alan Willis  Procedure(s) Performed: ESOPHAGOGASTRODUODENOSCOPY (EGD) WITH PROPOFOL HEMOSTASIS CLIP PLACEMENT BIOPSY  Patient Location: PACU and Endoscopy Unit  Anesthesia Type:MAC  Level of Consciousness: drowsy  Airway & Oxygen Therapy: Patient Spontanous Breathing and Patient connected to face mask oxygen  Post-op Assessment: Report given to RN and Post -op Vital signs reviewed and stable  Post vital signs: Reviewed and stable  Last Vitals:  Vitals Value Taken Time  BP 99/62   Temp 36.9 C 03/05/23 1211  Pulse 70 03/05/23 1213  Resp 18 03/05/23 1213  SpO2 97 % 03/05/23 1213  Vitals shown include unfiled device data.  Last Pain:  Vitals:   03/05/23 1211  TempSrc: Temporal  PainSc:       Patients Stated Pain Goal: 2 (03/03/23 2045)  Complications: No notable events documented.

## 2023-03-05 NOTE — Progress Notes (Addendum)
eLink Physician-Brief Progress Note Patient Name: Alan Willis DOB: Sep 21, 1945 MRN: [id number]   Date of Service  03/05/2023  HPI/Events of Note  77 year old male with atrial fibrillation on warfarin, presented with coffee ground emesis. PCCM consulted for hypotension. Found to be in hemorrhagic shock in the setting of GI bleed while anticoagulated.   Persistently agitated, Qtc 458.  Calm but hyperalert during examination.  eICU Interventions  Start haldol as needed x3 doses   0313 -has been having intermittent tachyarrhythmias.  Typically nonsustained.  Blood pressure is quite borderline.  Resume Tikosyn earlier.  Net positive for the past 24 hours.  Developing low-grade fever and being treated with acetaminophen.  Hemoglobin above 8 this morning.  For now, continue observation.  If sustained SBP less than 90 or persistent tachycardia, may attempt an albumin bolus.  0421 - Persistent hypotension, will start with Albumin 25% bolus  Intervention Category Minor Interventions: Agitation / anxiety - evaluation and management  Danitza Schoenfeldt 03/05/2023, 9:38 PM

## 2023-03-05 NOTE — Plan of Care (Signed)
  Problem: Education: Goal: Knowledge of General Education information will improve Description: Including pain rating scale, medication(s)/side effects and non-pharmacologic comfort measures Outcome: Progressing   Problem: Clinical Measurements: Goal: Diagnostic test results will improve Outcome: Progressing   Problem: Nutrition: Goal: Adequate nutrition will be maintained Outcome: Progressing   

## 2023-03-05 NOTE — Op Note (Signed)
[facility] Patient Name: Alan Willis Procedure Date: 03/05/2023 MRN: [id number] Attending MD: Kerin Salen , MD, [id number] Date of Birth: 1945-06-13 CSN: [id number] Age: 77 Admit Type: Inpatient Procedure:                Upper GI endoscopy Indications:              Acute post hemorrhagic anemia, Coffee-ground                            emesis,bloody return in NG tube, Melena, SBO,                            retching Providers:                Kerin Salen, MD, Marge Duncans, RN, Geoffery Lyons,                            Technician Referring MD:             ICU team, Surgical team Medicines:                Monitored Anesthesia Care Complications:            No immediate complications. Estimated blood loss:                            Minimal. Estimated Blood Loss:     Estimated blood loss was minimal. Procedure:                Pre-Anesthesia Assessment:                           - Prior to the procedure, a History and Physical                            was performed, and patient medications and                            allergies were reviewed. The patient's tolerance of                            previous anesthesia was also reviewed. The risks                            and benefits of the procedure and the sedation                            options and risks were discussed with the patient.                            All questions were answered, and informed consent                            was obtained. Prior Anticoagulants: The patient has                            taken Coumadin (warfarin),  last dose was 3 days                            prior to procedure. ASA Grade Assessment: III - A                            patient with severe systemic disease. After                            reviewing the risks and benefits, the patient was                            deemed in satisfactory condition to undergo the                            procedure.                            After obtaining informed consent, the endoscope was                            passed under direct vision. Throughout the                            procedure, the patient's blood pressure, pulse, and                            oxygen saturations were monitored continuously. The                            [id number]-[id number] ([id number]) Olympus endoscope was introduced                            through the mouth, and advanced to the second part                            of duodenum. The upper GI endoscopy was                            accomplished without difficulty. The patient                            tolerated the procedure well. Scope In: Scope Out: Findings:      Two large non-bleeding Mallory-Weiss tear with stigmata of recent       bleeding was found.      There were two large clots noted at the same site.      To repair the defect, the tissue edges were approximated and three       hemostatic clips were successfully placed (MR conditional). Clip       manufacturer: [employer].      There was no bleeding at the end of the procedure.      Two non-bleeding linear and superficial gastric ulcers with a clean       ulcer base (Forrest Class III) was found in the gastric fundus.  The       lesion was 8 mm in largest dimension.      Localized severe mucosal changes characterized by erythema,       inflammation, nodularity and altered texture were found in the duodenal       bulb. Biopsies were taken with a cold forceps for histology.      The second portion of the duodenum was normal.      The greater curvature of the stomach, lesser curvature of the stomach,       incisura, gastric antrum and pylorus were normal. Impression:               - Mallory-Weiss tear. Clips (MR conditional) were                            placed. Clip manufacturer: [employer].                           - Non-bleeding gastric ulcers with a clean ulcer                            base  (Forrest Class III).                           - Mucosal changes in the duodenum. Biopsied.                           - Normal second portion of the duodenum.                           - Normal greater curvature of the stomach, lesser                            curvature of the stomach, incisura, antrum and                            pylorus. Moderate Sedation:      Patient did not receive moderate sedation for this procedure, but       instead received monitored anesthesia care. Recommendation:           - Clear liquid diet.                           - PPI BID for 2 months, then once a day                            indefinitely as he needs to resume warfarin.                           - Ok to start heparin drip today.                           - Ok to resume warfarin in 3 days. Procedure Code(s):        --- Professional ---  16109, Esophagogastroduodenoscopy, flexible,                            transoral; with biopsy, single or multiple Diagnosis Code(s):        --- Professional ---                           K22.6, Gastro-esophageal laceration-hemorrhage                            syndrome                           K25.9, Gastric ulcer, unspecified as acute or                            chronic, without hemorrhage or perforation                           K31.89, Other diseases of stomach and duodenum                           D62, Acute posthemorrhagic anemia                           K92.0, Hematemesis                           K92.1, Melena (includes Hematochezia) CPT copyright 2022 American [employer] Association. All rights reserved. The codes documented in this report are preliminary and upon coder review may  be revised to meet current compliance requirements. Kerin Salen, MD 03/05/2023 12:13:04 PM This report has been signed electronically. Number of Addenda: 0

## 2023-03-05 NOTE — Anesthesia Preprocedure Evaluation (Addendum)
Anesthesia Evaluation  Patient identified by MRN, date of birth, ID band Patient awake    Reviewed: Allergy & Precautions, H&P , NPO status , Patient's Chart, lab work & pertinent test results  Airway Mallampati: II  TM Distance: >3 FB Neck ROM: Full    Dental no notable dental hx. (+) Teeth Intact, Dental Advisory Given   Pulmonary Current Smoker   Pulmonary exam normal breath sounds clear to auscultation       Cardiovascular + dysrhythmias Atrial Fibrillation + Valvular Problems/Murmurs MR  Rhythm:Regular Rate:Normal  S/p AVR   Neuro/Psych Seizures -, Well Controlled,   negative psych ROS   GI/Hepatic negative GI ROS, Neg liver ROS,,,  Endo/Other  negative endocrine ROS    Renal/GU Renal InsufficiencyRenal disease  negative genitourinary   Musculoskeletal   Abdominal   Peds  Hematology negative hematology ROS (+)   Anesthesia Other Findings   Reproductive/Obstetrics negative OB ROS                             Anesthesia Physical Anesthesia Plan  ASA: 3  Anesthesia Plan: MAC   Post-op Pain Management: Minimal or no pain anticipated   Induction: Intravenous  PONV Risk Score and Plan: 0 and Propofol infusion  Airway Management Planned: Natural Airway and Simple Face Mask  Additional Equipment:   Intra-op Plan:   Post-operative Plan:   Informed Consent: I have reviewed the patients History and Physical, chart, labs and discussed the procedure including the risks, benefits and alternatives for the proposed anesthesia with the patient or authorized representative who has indicated his/her understanding and acceptance.     Dental advisory given  Plan Discussed with: CRNA  Anesthesia Plan Comments:        Anesthesia Quick Evaluation

## 2023-03-05 NOTE — Progress Notes (Signed)
Patient went into atrial fibrillation with RVR this evening.  Will resume his home dofetilide.  Would like to avoid amiodarone if possible so that he doesn't need a wash out period prior to resuming dofetilide.  However, if he becomes unstable, OK to stop dofetilide and start IV amiodarone.   Recommend transfusing to maintain hgb >8.  We will see him again tomorrow.  Alan Devan C. Duke Salvia, MD, [facility] 03/05/2023 5:12 PM

## 2023-03-05 NOTE — Progress Notes (Signed)
Patient ID: Alan Willis, male   DOB: [birth year], 77 y.o.   MRN: [id number] [facility] Surgery Progress Note     Subjective: CC-  No abdominal complaints today. He denies abdominal pain, bloating, nausea, or vomiting. He is passing some flatus this morning and had a BM yesterday. NG with 1325cc out last 24hr. Xray showed contrast in the colon and normal bowel gas pattern.  Going for EGD today. He was given 1u PRBCs 11/18. Hgb 7.4 << 7 << 8.5  Objective: Vital signs in last 24 hours: Temp:  [98.8 F (37.1 C)-100.6 F (38.1 C)] 99.1 F (37.3 C) (11/19 0830) Pulse Rate:  [33-95] 66 (11/19 0830) Resp:  [0-26] 18 (11/19 0830) BP: (88-129)/(28-61) 93/50 (11/19 0830) SpO2:  [87 %-100 %] 100 % (11/19 0830) Last BM Date : 03/04/23  Intake/Output from previous day: 11/18 0701 - 11/19 0700 In: 3085.8 [I.V.:1522.5; Blood:623.3; NG/GT:600; IV Piggyback:340] Out: 4125 [Urine:2800; Emesis/NG output:1325] Intake/Output this shift: No intake/output data recorded.  PE: Gen:  Alert, NAD, pleasant Card:  RRR Pulm: rate and effort normal Abd: Soft, NT/ND, +BS  Lab Results:  Recent Labs    03/04/23 0242 03/04/23 1455 03/05/23 0054  WBC 23.0*  --  11.1*  HGB 8.5* 7.0* 7.4*  HCT 23.8* 20.8* 22.5*  PLT 128*  --  89*   BMET Recent Labs    03/04/23 0242 03/05/23 0054  NA 125* 132*  K 4.4 4.0  CL 90* 99  CO2 28 28  GLUCOSE 151* 92  BUN 46* 35*  CREATININE 1.55* 1.34*  CALCIUM 8.1* 8.0*   PT/INR Recent Labs    03/04/23 0242 03/05/23 0054  LABPROT 30.3* 17.5*  INR 2.9* 1.4*   CMP     Component Value Date/Time   NA 132 (L) 03/05/2023 0054   K 4.0 03/05/2023 0054   CL 99 03/05/2023 0054   CO2 28 03/05/2023 0054   GLUCOSE 92 03/05/2023 0054   BUN 35 (H) 03/05/2023 0054   CREATININE 1.34 (H) 03/05/2023 0054   CALCIUM 8.0 (L) 03/05/2023 0054   PROT 5.6 (L) 03/04/2023 0242   ALBUMIN 3.6 03/04/2023 0242   AST 26 03/04/2023 0242   ALT 13 03/04/2023 0242    ALKPHOS 30 (L) 03/04/2023 0242   BILITOT 2.3 (H) 03/04/2023 0242   GFRNONAA 55 (L) 03/05/2023 0054   GFRAA  12/29/2009 0625    >60        The eGFR has been calculated using the MDRD equation. This calculation has not been validated in all clinical situations. eGFR's persistently <60 mL/min signify possible Chronic Kidney Disease.   Lipase     Component Value Date/Time   LIPASE <10 (L) 03/03/2023 1048       Studies/Results: DG Abd Portable 1V-Small Bowel Obstruction Protocol-initial, 8 hr delay  Result Date: 03/04/2023 CLINICAL DATA:  Follow-up SBO, 8 hour delayed film EXAM: PORTABLE ABDOMEN - 1 VIEW COMPARISON:  03/03/2023 FINDINGS: Enteric tube terminates in the proximal gastric body. No findings suspicious for small bowel obstruction. Contrast opacifies the colon at least to the splenic flexure and likely to the rectum. Prior bilateral inguinal hernia mesh repair. Vascular calcifications. IMPRESSION: Enteric tube terminates in the proximal gastric body. No findings suspicious for small bowel obstruction. Contrast to the left colon, as above. Electronically Signed   By: Charline Bills M.D.   On: 03/04/2023 11:56   DG CHEST PORT 1 VIEW  Result Date: 03/03/2023 CLINICAL DATA:  NG tube placement EXAM: PORTABLE CHEST 1 VIEW  COMPARISON:  03/03/2023 FINDINGS: NG tube is in the stomach. Prior median sternotomy and valve replacement. Heart and mediastinal contours within normal limits. No confluent airspace opacities or effusions. Minimal bibasilar opacities, favor atelectasis. No acute bony abnormality. IMPRESSION: NG tube in the stomach. Bibasilar atelectasis. Electronically Signed   By: Charlett Nose M.D.   On: 03/03/2023 21:37   DG Chest Port 1 View  Result Date: 03/03/2023 CLINICAL DATA:  Nausea and vomiting. EXAM: PORTABLE CHEST 1 VIEW COMPARISON:  03/16/2021. FINDINGS: Cardiac silhouette appears prominent. No pneumonia or pulmonary edema. No pneumothorax or pleural effusion. NG  tube tip is at the epigastric region and side port is overlying distal esophagus. The tube should be advanced about 7 cm. IMPRESSION: Enlarged cardiac silhouette. Lungs are clear. NG tube should be advanced. Electronically Signed   By: Layla Maw M.D.   On: 03/03/2023 18:24   DG Abd Portable 1 View  Result Date: 03/03/2023 CLINICAL DATA:  Status post NG tube placement. EXAM: PORTABLE ABDOMEN - 1 VIEW COMPARISON:  CT abdomen and pelvis 03/03/2023 FINDINGS: An enteric tube has been placed with its tip overlying the distal esophagus near the GE junction. Gastric and small bowel distension were more fully evaluated on today's CT. A calcified gallstone and aortic valve replacement are noted. IMPRESSION: Enteric tube tip near the GE junction. Recommend advancement and obtaining a repeat radiograph to ensure intragastric placement. Electronically Signed   By: Sebastian Ache M.D.   On: 03/03/2023 12:42   CT ABDOMEN PELVIS WO CONTRAST  Result Date: 03/03/2023 CLINICAL DATA:  Abdominal pain, acute, nonlocalized EXAM: CT ABDOMEN AND PELVIS WITHOUT CONTRAST TECHNIQUE: Multidetector CT imaging of the abdomen and pelvis was performed following the standard protocol without IV contrast. RADIATION DOSE REDUCTION: This exam was performed according to the departmental dose-optimization program which includes automated exposure control, adjustment of the mA and/or kV according to patient size and/or use of iterative reconstruction technique. COMPARISON:  04/14/2020 FINDINGS: Lower chest: Lung bases are clear.  Coronary artery atherosclerosis. Hepatobiliary: No focal liver abnormality identified on noncontrast images. Markedly dilated gallbladder. 1.7 cm stone in the region of the gallbladder neck. No pericholecystic inflammatory changes by CT. No intrahepatic biliary dilatation. Pancreas: Unremarkable. No pancreatic ductal dilatation or surrounding inflammatory changes. Spleen: Normal in size without focal abnormality.  Adrenals/Urinary Tract: Unremarkable adrenal glands. Bilateral renal cysts, which require no follow-up imaging. Kidneys otherwise unremarkable. No stone or hydronephrosis. Urinary bladder within normal limits. Stomach/Bowel: Fluid within the distal esophagus. Moderately dilated stomach. Multiple fluid-filled, dilated loops of small bowel throughout the abdomen with abrupt transition to decompressed bowel anteriorly within the central abdomen (series 2, image 54). There is fecalization of small bowel content immediately upstream to the transition site. Distal small bowel is decompressed. A small loop of distal small bowel extends into patient's right inguinal hernia. Normal appendix in the right lower quadrant. Colonic diverticulosis. No colonic wall thickening or inflammatory changes. Vascular/Lymphatic: Extensive aortoiliac atherosclerosis. No lymphadenopathy. Reproductive: Prostate gland within normal limits. Other: No ascites or pneumoperitoneum. Right inguinal hernia containing fat and distal small bowel. Musculoskeletal: No acute or significant osseous findings. IMPRESSION: 1. Small-bowel obstruction with abrupt transition to decompressed bowel anteriorly within the central abdomen, possibly secondary to adhesions. 2. Markedly dilated gallbladder with 1.7 cm stone in the region of the gallbladder neck. No pericholecystic inflammatory changes by CT. Correlate clinically for acute cholecystitis. 3. Right inguinal hernia containing fat and distal small bowel. 4. Colonic diverticulosis without evidence of acute diverticulitis. 5. Aortic atherosclerosis (  ICD10-I70.0). These results were called by telephone at the time of interpretation on 03/03/2023 at 10:26 am to provider Lorre Nick , who verbally acknowledged these results. Electronically Signed   By: Duanne Guess D.O.   On: 03/03/2023 10:27    Anti-infectives: Anti-infectives (From admission, onward)    None        Assessment/Plan 77 year old  man with partial small bowel obstruction and subsequent dehydration, severe metabolic derangements on presentation.  -Follow up Xray showed contrast in the colon and normal bowel gas pattern. Patient has had return in bowel function with flatus and BM, and his abdominal exam is benign. Continue NG to LIWS for now and await GI plans regarding GIB. With high NG output would recommend clamping trial prior to removing NG.   Additional problems present on admission: Possible upper GI bleed-GI following, scheduled for EGD today. Hgb 7.4 << 7 << 8.5. s/p 1u PRBCs 11/18 Leukocytosis-11 from 23 this morning A-fib/flutter with RVR and secondary hypotension Stress cardiomyopathy Acute kidney injury on chronic kidney disease-creatinine 1.34 (1.8 on presentation) Hyponatremia-132 today Delirium History of aortic valve replacement Memory issues Seizures Chronic anticoagulation Cholelithiasis Prior bilateral laparoscopic inguinal hernia repairs (2000), prior open sigmoid colectomy (2007) history of A-fib/AVR previously on warfarin history of sigmoid colectomy in 2007  ID - none FEN - IVF, NPO/NGT to LIWS VTE - SCDs  I reviewed CCM notes, last 24 h vitals and pain scores, last 48 h intake and output, last 24 h labs and trends, and last 24 h imaging results.    LOS: 2 days    Franne Forts, [facility] Surgery 03/05/2023, 8:37 AM Please see Amion for pager number during day hours 7:00am-4:30pm

## 2023-03-05 NOTE — Progress Notes (Signed)
Chart reviewed with Dr. Duke Salvia. UGI results reviewed. Heparin being started. No new acute recs. Following for stabilization of renal function/hemodynamics for restart of Tikosyn. Will repeat EKG in AM.

## 2023-03-05 NOTE — Progress Notes (Addendum)
PHARMACY - ANTICOAGULATION CONSULT NOTE  Pharmacy Consult for heparin Indication:  atrial fibrillation, mechanical AVR  Allergies  Allergen Reactions   Amoxicillin-Pot Clavulanate Rash   Codeine Other (See Comments)    Stomach upset    Patient Measurements: Height: 5\' 10"  (177.8 cm) Weight: 79.3 kg (174 lb 13.2 oz) IBW/kg (Calculated) : 73 Heparin Dosing Weight: 79.3 kg  Vital Signs: Temp: 98.4 F (36.9 C) (11/19 1211) Temp Source: Temporal (11/19 1211) BP: 126/44 (11/19 1230) Pulse Rate: 62 (11/19 1230)  Labs: Recent Labs    03/03/23 0843 03/03/23 1048 03/03/23 1404 03/03/23 1922 03/04/23 0012 03/04/23 0242 03/04/23 1455 03/05/23 0054 03/05/23 0957  HGB 16.2  --    < > 11.6*   < > 8.5* 7.0* 7.4* 7.4*  HCT 45.9  --    < > 33.6*   < > 23.8* 20.8* 22.5* 22.6*  PLT 181  --   --  155  --  128*  --  89*  --   LABPROT 30.3*  --   --  40.2*  --  30.3*  --  17.5*  --   INR 2.9*  --   --  4.1*  --  2.9*  --  1.4*  --   CREATININE 1.81*  --    < > 1.56*  --  1.55*  --  1.34*  --   TROPONINIHS 104* 91*  --   --   --   --   --   --   --    < > = values in this interval not displayed.    Estimated Creatinine Clearance: 48.4 mL/min (A) (by C-G formula based on SCr of 1.34 mg/dL (H)).   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   Stricture of sigmoid colon s/p colectomy 2007 2007    Medications:  Medications Prior to Admission  Medication Sig Dispense Refill Last Dose   Cholecalciferol (VITAMIN D3) 1000 units CAPS Take 1,000 Units by mouth daily.   Past Week    dofetilide (TIKOSYN) 250 MCG capsule Take 250 mcg by mouth every 12 (twelve) hours.   03/02/2023 at 1930   doxycycline (VIBRA-TABS) 100 MG tablet Take 100 mg by mouth See admin instructions. Take 100 mg by mouth 30-60 minutes prior to dental procedures   Dental at unk   ferrous sulfate 325 (65 FE) MG tablet Take 325 mg by mouth daily with breakfast.   Past Week   furosemide (LASIX) 40 MG tablet Take 40 mg by mouth in the morning.   03/02/2023 at am   IMODIUM A-D 2 MG tablet Take 2 mg by mouth 4 (four) times daily as needed for diarrhea or loose stools.   unk   levETIRAcetam (KEPPRA) 500 MG tablet Take 1 tablet in AM and 1 and 1/2 tablets in PM (Patient taking differently: Take 500-750 mg by mouth See admin instructions. Take 500 mg by mouth in the morning and 750 mg in the evening) 225 tablet 1 03/02/2023 at pm   magnesium oxide (MAG-OX) 400 (240 Mg) MG tablet Take 400 mg by mouth  daily.   Past Week   spironolactone (ALDACTONE) 25 MG tablet Take 12.5 mg by mouth in the morning.   03/02/2023 at am   TYLENOL 500 MG tablet Take 500 mg by mouth every 6 (six) hours as needed for mild pain (pain score 1-3) or headache.   unk   warfarin (COUMADIN) 6 MG tablet Take 3-6 mg by mouth See admin instructions. Take 6 mg by mouth in the morning on Sun/Mon/Wed/Thurs/Fri/Sat and 3 mg on Tues   03/02/2023 at 0800   Scheduled:   sodium chloride   Intravenous Once   Chlorhexidine Gluconate Cloth  6 each Topical Daily   lidocaine  1 Application Topical Once   mouth rinse  15 mL Mouth Rinse 4 times per day   pantoprazole (PROTONIX) IV  40 mg Intravenous Q6H   Followed by   Melene Muller ON 03/07/2023] pantoprazole (PROTONIX) IV  40 mg Intravenous Q12H   Assessment: 77 YO male presenting with a SBO and hematemesis. Patient has a significant cardiac history of atrial fibrillation and AVR on warfarin prior to admission. Last dose of warfarin 11/16 @0800 . INR was 2.9 on admission and increased up to 4.1 on 11/17 (goal INR  2.5-3.5)--warfarin has been held since admission and he was given IV vitamin K 10mg  x1 on 11/18 to get INR <2 for EGD. EGD from 11/19 showing 2 large Mallory-Weiss tear with clots--3 endoclips have been placed to close mucosal defect, superficial fundic ulcers. Pharmacy consulted for heparin dosing with plan to restart warfarin in 3 days, per GI.  PTA warfarin regimen: 6mg  daily except 3mg  on Tuesdays.   Today, 03/05/23: INR 1.4--subtherapeutic Hgb 7.4, plts 89--low and hopeful to improve with intervention done during EGD. Pharmacy will continue to closely monitor. Avoid bolusing heparin and start at a conservative rate given decreased hgb and plts  Goal of Therapy:  Heparin level 0.3-0.7 units/ml Monitor platelets by anticoagulation protocol: Yes   Plan:  Start heparin gtt @1000  units/hr  Check heparin level in 8hrs Monitor heparin level, CBC, s/sx of bleeding daily Plan to transition to warfarin in 3 days (11/22)   Cherylin Mylar, PharmD Clinical Pharmacist  11/19/20241:03 PM

## 2023-03-05 NOTE — Anesthesia Postprocedure Evaluation (Signed)
Anesthesia Post Note  Patient: SEMAJAY SINGLE  Procedure(s) Performed: ESOPHAGOGASTRODUODENOSCOPY (EGD) WITH PROPOFOL HEMOSTASIS CLIP PLACEMENT BIOPSY     Patient location during evaluation: Endoscopy Anesthesia Type: MAC Level of consciousness: awake and alert Pain management: pain level controlled Vital Signs Assessment: post-procedure vital signs reviewed and stable Respiratory status: spontaneous breathing, nonlabored ventilation, respiratory function stable and patient connected to nasal cannula oxygen Cardiovascular status: stable and blood pressure returned to baseline Postop Assessment: no apparent nausea or vomiting Anesthetic complications: no  No notable events documented.  Last Vitals:  Vitals:   03/05/23 1300 03/05/23 1400  BP: (!) 116/51 (!) 118/44  Pulse: (!) 59 64  Resp: 16 18  Temp: 37.4 C 37.6 C  SpO2: 98% 94%    Last Pain:  Vitals:   03/05/23 1230  TempSrc:   PainSc: 0-No pain                 Kasiyah Platter,W. EDMOND

## 2023-03-05 NOTE — Interval H&P Note (Signed)
History and Physical Interval Note: 77 year old male with coffee-ground return in NG tube, melena, anemia, was on warfarin, last dose 03/02/2023, small bowel obstruction, for EGD with propofol.  03/05/2023 11:11 AM  Alan Willis  has presented today for EGD with propofol, with the diagnosis of Bloody output from NG tube, melena, anemia, was on warfarin, small bowel obstruction.  The various methods of treatment have been discussed with the patient and family. After consideration of risks, benefits and other options for treatment, the patient has consented to  Procedure(s): ESOPHAGOGASTRODUODENOSCOPY (EGD) WITH PROPOFOL (N/A) as a surgical intervention.  The patient's history has been reviewed, patient examined, no change in status, stable for surgery.  I have reviewed the patient's chart and labs.  Questions were answered to the patient's satisfaction.     Kerin Salen

## 2023-03-06 DIAGNOSIS — Z7901 Long term (current) use of anticoagulants: Secondary | ICD-10-CM | POA: Diagnosis not present

## 2023-03-06 DIAGNOSIS — Z952 Presence of prosthetic heart valve: Secondary | ICD-10-CM | POA: Diagnosis not present

## 2023-03-06 DIAGNOSIS — R7989 Other specified abnormal findings of blood chemistry: Secondary | ICD-10-CM | POA: Diagnosis not present

## 2023-03-06 DIAGNOSIS — K920 Hematemesis: Secondary | ICD-10-CM | POA: Diagnosis not present

## 2023-03-06 DIAGNOSIS — K56609 Unspecified intestinal obstruction, unspecified as to partial versus complete obstruction: Secondary | ICD-10-CM | POA: Diagnosis not present

## 2023-03-06 LAB — BPAM RBC
Blood Product Expiration Date: 202412142359
Blood Product Expiration Date: 202412162359
Blood Product Expiration Date: 202412142359
ISSUE DATE / TIME: 202411181057
ISSUE DATE / TIME: 202411181818
ISSUE DATE / TIME: 202411191745
Unit Type and Rh: 5100
Unit Type and Rh: 5100
Unit Type and Rh: 5100

## 2023-03-06 LAB — CBC
HCT: 24.4 % — ABNORMAL LOW (ref 39.0–52.0)
Hemoglobin: 8.1 g/dL — ABNORMAL LOW (ref 13.0–17.0)
MCH: 31.8 pg (ref 26.0–34.0)
MCHC: 33.2 g/dL (ref 30.0–36.0)
MCV: 95.7 fL (ref 80.0–100.0)
Platelets: 72 10*3/uL — ABNORMAL LOW (ref 150–400)
RBC: 2.55 MIL/uL — ABNORMAL LOW (ref 4.22–5.81)
RDW: 13.6 % (ref 11.5–15.5)
WBC: 8.6 10*3/uL (ref 4.0–10.5)
nRBC: 0 % (ref 0.0–0.2)

## 2023-03-06 LAB — TYPE AND SCREEN
ABO/RH(D): O POS
Antibody Screen: NEGATIVE
Unit division: 0
Unit division: 0
Unit division: 0

## 2023-03-06 LAB — HEPARIN LEVEL (UNFRACTIONATED)
Heparin Unfractionated: 0.18 [IU]/mL — ABNORMAL LOW (ref 0.30–0.70)
Heparin Unfractionated: 0.17 [IU]/mL — ABNORMAL LOW (ref 0.30–0.70)

## 2023-03-06 LAB — BASIC METABOLIC PANEL
Anion gap: 10 (ref 5–15)
BUN: 19 mg/dL (ref 8–23)
CO2: 20 mmol/L — ABNORMAL LOW (ref 22–32)
Calcium: 7.9 mg/dL — ABNORMAL LOW (ref 8.9–10.3)
Chloride: 103 mmol/L (ref 98–111)
Creatinine, Ser: 1.16 mg/dL (ref 0.61–1.24)
GFR, Estimated: >60 mL/min (ref >60)
Glucose, Bld: 94 mg/dL (ref 70–99)
Potassium: 3.5 mmol/L (ref 3.5–5.1)
Sodium: 133 mmol/L — ABNORMAL LOW (ref 135–145)

## 2023-03-06 LAB — MAGNESIUM: Magnesium: 2.1 mg/dL (ref 1.7–2.4)

## 2023-03-06 LAB — LACTIC ACID, PLASMA: Lactic Acid, Venous: 1 mmol/L (ref 0.5–1.9)

## 2023-03-06 LAB — PROTIME-INR
INR: 1.1 (ref 0.8–1.2)
Prothrombin Time: 14.5 s (ref 11.4–15.2)

## 2023-03-06 LAB — PHOSPHORUS: Phosphorus: 1.7 mg/dL — ABNORMAL LOW (ref 2.5–4.6)

## 2023-03-06 LAB — PROCALCITONIN: Procalcitonin: 0.2 ng/mL

## 2023-03-06 LAB — SURGICAL PATHOLOGY

## 2023-03-06 MED ORDER — POTASSIUM CHLORIDE CRYS ER 20 MEQ PO TBCR
40.0000 meq | EXTENDED_RELEASE_TABLET | Freq: Once | ORAL | Status: AC
  Administered 2023-03-06: 40 meq via ORAL
  Filled 2023-03-06: qty 2

## 2023-03-06 MED ORDER — HEPARIN (PORCINE) 25000 UT/250ML-% IV SOLN
1400.0000 [IU]/h | INTRAVENOUS | Status: DC
  Administered 2023-03-06: 1450 [IU]/h via INTRAVENOUS
  Administered 2023-03-07 (×2): 1750 [IU]/h via INTRAVENOUS
  Administered 2023-03-08 – 2023-03-11 (×6): 1900 [IU]/h via INTRAVENOUS
  Administered 2023-03-11 – 2023-03-12 (×2): 1800 [IU]/h via INTRAVENOUS
  Administered 2023-03-13: 1700 [IU]/h via INTRAVENOUS
  Administered 2023-03-13: 1800 [IU]/h via INTRAVENOUS
  Administered 2023-03-14 – 2023-03-16 (×4): 1400 [IU]/h via INTRAVENOUS
  Filled 2023-03-06 (×18): qty 250

## 2023-03-06 MED ORDER — ALBUMIN HUMAN 25 % IV SOLN
25.0000 g | Freq: Once | INTRAVENOUS | Status: AC
  Administered 2023-03-06: 25 g via INTRAVENOUS
  Filled 2023-03-06: qty 100

## 2023-03-06 MED ORDER — BOOST / RESOURCE BREEZE PO LIQD CUSTOM
1.0000 | Freq: Three times a day (TID) | ORAL | Status: DC
  Administered 2023-03-06 – 2023-03-17 (×25): 1 via ORAL

## 2023-03-06 NOTE — Progress Notes (Addendum)
Notified by RN that patient went back into atrial flutter, HR 120s. Pending EKG transfer over to Epic. SBP 120s per nurse, asymptomatic. D/w Dr. Duke Salvia. Patient had also gone in/out overnight. She recommends to continue Tikosyn as ordered and hold off additional med changes for the time being. Advised to follow BP and notify for any concerning changes or symptoms. Addendum: reviewed EKG with MD who recommends OK to continue Tikosyn, since QTC increase <15% and remains under . BP 111/71, stable.

## 2023-03-06 NOTE — Progress Notes (Signed)
Subjective: Patient has been tolerating clear liquid diet. No further episode of nausea or vomiting. Denies abdominal pain. Has not had a bowel movement since endoscopy yesterday.  Objective: Vital signs in last 24 hours: Temp:  [97.8 F (36.6 C)-100.9 F (38.3 C)] 99 F (37.2 C) (11/20 0900) Pulse Rate:  [59-153] 66 (11/20 0900) Resp:  [5-29] 12 (11/20 0900) BP: (77-126)/(35-82) 112/46 (11/20 0900) SpO2:  [88 %-100 %] 96 % (11/20 0900) Weight change:  Last BM Date : 03/04/23  PE: Not in distress GENERAL: Mild pallor  ABDOMEN: Slightly distended, but nontender, bowel sounds audible EXTREMITIES: No deformity  Lab Results: Results for orders placed or performed during the hospital encounter of 03/03/23 (from the past 48 hour(s))  Hemoglobin and hematocrit, blood     Status: Abnormal   Collection Time: 03/04/23  2:55 PM  Result Value Ref Range   Hemoglobin 7.0 (L) 13.0 - 17.0 g/dL   HCT 65.7 (L) 84.6 - 96.2 %    Comment: Performed at [facility], [address], [place], [place] [address]  Prepare RBC (crossmatch)     Status: None   Collection Time: 03/04/23  5:17 PM  Result Value Ref Range   Order Confirmation      ORDER PROCESSED BY BLOOD BANK Performed at [facility], [address] [place], [place] [address]   CBC     Status: Abnormal   Collection Time: 03/05/23 12:54 AM  Result Value Ref Range   WBC 11.1 (H) 4.0 - 10.5 K/uL   RBC 2.39 (L) 4.22 - 5.81 MIL/uL   Hemoglobin 7.4 (L) 13.0 - 17.0 g/dL   HCT 01.0 (L) 27.2 - 53.6 %   MCV 94.1 80.0 - 100.0 fL   MCH 31.0 26.0 - 34.0 pg   MCHC 32.9 30.0 - 36.0 g/dL   RDW 64.4 03.4 - 74.2 %   Platelets 89 (L) 150 - 400 K/uL    Comment: SPECIMEN CHECKED FOR CLOTS Immature Platelet Fraction may be clinically indicated, consider ordering this additional test [id number] REPEATED TO VERIFY PLATELET COUNT CONFIRMED BY SMEAR    nRBC 0.0 0.0 - 0.2 %    Comment: Performed at [facility], [address], [place], [place] [address]  Basic metabolic panel     Status: Abnormal   Collection Time: 03/05/23 12:54 AM  Result Value Ref Range   Sodium 132 (L) 135 - 145 mmol/L    Comment: DELTA CHECK NOTED   Potassium 4.0 3.5 - 5.1 mmol/L   Chloride 99 98 - 111 mmol/L   CO2 28 22 - 32 mmol/L   Glucose, Bld 92 70 - 99 mg/dL    Comment: Glucose reference range applies only to samples taken after fasting for at least 8 hours.   BUN 35 (H) 8 - 23 mg/dL   Creatinine, Ser 9.51 (H) 0.61 - 1.24 mg/dL   Calcium 8.0 (L) 8.9 - 10.3 mg/dL   GFR, Estimated 55 (L) >60 mL/min    Comment: (NOTE) Calculated using the CKD-EPI Creatinine Equation (2021)    Anion gap 5 5 - 15    Comment: Performed at [facility], [address] [place], [place] [address]  Protime-INR     Status: Abnormal   Collection Time: 03/05/23 12:54 AM  Result Value Ref Range   Prothrombin Time 17.5 (H) 11.4 - 15.2 seconds   INR 1.4 (H) 0.8 - 1.2    Comment: (NOTE) INR goal varies based on  device and disease states. Performed at [facility], [address] [place], [place] [address]   Magnesium     Status: None   Collection Time: 03/05/23 12:54 AM  Result Value Ref Range   Magnesium 2.3 1.7 - 2.4 mg/dL    Comment: Performed at [facility], [address], [place], [place] [address]  Hemoglobin and hematocrit, blood     Status: Abnormal   Collection Time: 03/05/23  9:57 AM  Result Value Ref Range   Hemoglobin 7.4 (L) 13.0 - 17.0 g/dL   HCT 82.9 (L) 56.2 - 13.0 %    Comment: Performed at [facility], [address] [place], [place] [address]  Hemoglobin and hematocrit, blood     Status: Abnormal   Collection Time: 03/05/23  2:28 PM  Result Value Ref Range   Hemoglobin 7.5 (L) 13.0 - 17.0 g/dL   HCT 46.9 (L) 62.9 - 52.8 %    Comment: Performed at [facility], [address], [place],  [place] [address]  Prepare RBC (crossmatch)     Status: None   Collection Time: 03/05/23  5:39 PM  Result Value Ref Range   Order Confirmation      ORDER PROCESSED BY BLOOD BANK Performed at [facility], [address] [place], [place] [address]   Heparin level (unfractionated)     Status: Abnormal   Collection Time: 03/05/23 10:44 PM  Result Value Ref Range   Heparin Unfractionated 0.11 (L) 0.30 - 0.70 IU/mL    Comment: (NOTE) The clinical reportable range upper limit is being lowered to >1.10 to align with the FDA approved guidance for the current laboratory assay.  If heparin results are below expected values, and patient dosage has  been confirmed, suggest follow up testing of antithrombin III levels. Performed at [facility], [address], [place], [place] [address]   CBC     Status: Abnormal   Collection Time: 03/05/23 10:44 PM  Result Value Ref Range   WBC 8.3 4.0 - 10.5 K/uL   RBC 2.62 (L) 4.22 - 5.81 MIL/uL   Hemoglobin 8.3 (L) 13.0 - 17.0 g/dL   HCT 64.4 (L) 03.4 - 74.2 %   MCV 93.1 80.0 - 100.0 fL   MCH 31.7 26.0 - 34.0 pg   MCHC 34.0 30.0 - 36.0 g/dL   RDW 59.5 63.8 - 75.6 %   Platelets 73 (L) 150 - 400 K/uL    Comment: Immature Platelet Fraction may be clinically indicated, consider ordering this additional test [id number] CONSISTENT WITH PREVIOUS RESULT    nRBC 0.0 0.0 - 0.2 %    Comment: Performed at [facility], [address], [place], [place] [address]  Basic metabolic panel     Status: Abnormal   Collection Time: 03/06/23  2:58 AM  Result Value Ref Range   Sodium 133 (L) 135 - 145 mmol/L   Potassium 3.5 3.5 - 5.1 mmol/L   Chloride 103 98 - 111 mmol/L   CO2 20 (L) 22 - 32 mmol/L   Glucose, Bld 94 70 - 99 mg/dL    Comment: Glucose reference range applies only to samples taken after fasting for at least 8 hours.   BUN 19 8 - 23 mg/dL   Creatinine, Ser 0.63 0.61 - 1.24 mg/dL   Calcium 7.9 (L) 8.9 -  10.3 mg/dL   GFR, Estimated >01 >60 mL/min    Comment: (NOTE) Calculated using the  CKD-EPI Creatinine Equation (2021)    Anion gap 10 5 - 15    Comment: Performed at [facility], [address], [place], [place] [address]  Protime-INR     Status: None   Collection Time: 03/06/23  2:58 AM  Result Value Ref Range   Prothrombin Time 14.5 11.4 - 15.2 seconds   INR 1.1 0.8 - 1.2    Comment: (NOTE) INR goal varies based on device and disease states. Performed at [facility], [address], [place], [place] [address]   Magnesium     Status: None   Collection Time: 03/06/23  2:58 AM  Result Value Ref Range   Magnesium 2.1 1.7 - 2.4 mg/dL    Comment: Performed at [facility], [address], [place], [place] [address]  Phosphorus     Status: Abnormal   Collection Time: 03/06/23  2:58 AM  Result Value Ref Range   Phosphorus 1.7 (L) 2.5 - 4.6 mg/dL    Comment: Performed at [facility], [address] [place], [place] [address]  CBC     Status: Abnormal   Collection Time: 03/06/23  2:58 AM  Result Value Ref Range   WBC 8.6 4.0 - 10.5 K/uL   RBC 2.55 (L) 4.22 - 5.81 MIL/uL   Hemoglobin 8.1 (L) 13.0 - 17.0 g/dL   HCT 29.5 (L) 62.1 - 30.8 %   MCV 95.7 80.0 - 100.0 fL   MCH 31.8 26.0 - 34.0 pg   MCHC 33.2 30.0 - 36.0 g/dL   RDW 65.7 84.6 - 96.2 %   Platelets 72 (L) 150 - 400 K/uL    Comment: Immature Platelet Fraction may be clinically indicated, consider ordering this additional test [id number] CONSISTENT WITH PREVIOUS RESULT    nRBC 0.0 0.0 - 0.2 %    Comment: Performed at [facility], [address] [place], [place] [address]  Heparin level (unfractionated)     Status: Abnormal   Collection Time: 03/06/23  6:46 AM  Result Value Ref Range   Heparin Unfractionated 0.18 (L) 0.30 - 0.70 IU/mL    Comment: (NOTE) The clinical reportable range upper limit is being lowered to >1.10 to  align with the FDA approved guidance for the current laboratory assay.  If heparin results are below expected values, and patient dosage has  been confirmed, suggest follow up testing of antithrombin III levels. Performed at [facility], [address], [place], [place] [address]     Studies/Results: No results found.  Medications: I have reviewed the patient's current medications.  Assessment: Small bowel obstruction-management as per surgical team Large Mallory-Weiss tear with blood clots, treated with clip placement   Hemoglobin stable at 8.1, low platelets 72  Plan: Patient tolerating clear liquid diet, as per nurse, plan is to keep patient on clear liquid diet as abdomen appears more distended than yesterday. Diet advancement as per surgical team. Recommend continuing pantoprazole, on discharge recommend 40 mg twice a day for 2 months and then once a day indefinitely as he will need to resume warfarin. Patient has been resumed on IV heparin with plans to start warfarin from 03/08/2023. Management of small bowel obstruction as per surgical team, GI will sign off, please recall if needed.  Kerin Salen, MD 03/06/2023, 10:45 AM

## 2023-03-06 NOTE — Progress Notes (Addendum)
Tikosyn resumed  11/19 PM  Home dofetilide 250 mcg PO BID (last filled 90 day supply on 12/14/22). Was held since admission to hospital on 11/17.  Pharmacist on 11/19 inquired with Dr. Duke Salvia about the tikosyn re-initiation order set for all associated monitoring/nursing orders. Per MD, doesn't think it is necessary since pt has been on medication chronically.  11/19 afternoon EKG QTc 486 msec.  Per MD, that is below his baseline QTc and patient has been stable on dofetilide so ok to resume.   11/20 EKG with QTc 471  SCr 1.16 K 3.5 below goal (>/=4) and ordered KCl 40 mEq PO x1 dose.   Mag at goal >/= 2 No major drug-drug interactions noted  Plan:   Ordering an additional KCl 40 mEq PO x1 for this evening per discussion with Dayna Dunn PA-C.  Lynann Beaver PharmD, BCPS [facility] main pharmacy [phone] 03/06/2023 10:46 AM

## 2023-03-06 NOTE — Progress Notes (Signed)
Patient ID: Alan Willis, male   DOB: 12/25/1945, 77 y.o.   MRN: [id number] [facility] Progress Note  1 Day Post-Op  Subjective: CC-  No complaints this morning. Denies abdominal pain, nausea, or vomiting. Tolerating sips of water. States that he is not passing flatus. Last BM 2 days ago. WBC and creatinine normalized today.  Objective: Vital signs in last 24 hours: Temp:  [97.8 F (36.6 C)-100.9 F (38.3 C)] 99 F (37.2 C) (11/20 0800) Pulse Rate:  [46-153] 101 (11/20 0800) Resp:  [5-29] 21 (11/20 0800) BP: (77-126)/(35-82) 104/43 (11/20 0800) SpO2:  [62 %-100 %] 98 % (11/20 0800) Last BM Date : 03/04/23  Intake/Output from previous day: 11/19 0701 - 11/20 0700 In: 1378.8 [P.O.:240; I.V.:349.2; Blood:343.3; NG/GT:120; IV Piggyback:316.2] Out: 1350 [Urine:1350] Intake/Output this shift: Total I/O In: 39.6 [I.V.:12.4; IV Piggyback:27.2] Out: -   PE: Gen:  Alert, NAD, pleasant Card:  tachy Pulm: rate and effort normal on room air Abd: Soft, mild distension, nontender, +BS  Lab Results:  Recent Labs    03/05/23 2244 03/06/23 0258  WBC 8.3 8.6  HGB 8.3* 8.1*  HCT 24.4* 24.4*  PLT 73* 72*   BMET Recent Labs    03/05/23 0054 03/06/23 0258  NA 132* 133*  K 4.0 3.5  CL 99 103  CO2 28 20*  GLUCOSE 92 94  BUN 35* 19  CREATININE 1.34* 1.16  CALCIUM 8.0* 7.9*   PT/INR Recent Labs    03/05/23 0054 03/06/23 0258  LABPROT 17.5* 14.5  INR 1.4* 1.1   CMP     Component Value Date/Time   NA 133 (L) 03/06/2023 0258   K 3.5 03/06/2023 0258   CL 103 03/06/2023 0258   CO2 20 (L) 03/06/2023 0258   GLUCOSE 94 03/06/2023 0258   BUN 19 03/06/2023 0258   CREATININE 1.16 03/06/2023 0258   CALCIUM 7.9 (L) 03/06/2023 0258   PROT 5.6 (L) 03/04/2023 0242   ALBUMIN 3.6 03/04/2023 0242   AST 26 03/04/2023 0242   ALT 13 03/04/2023 0242   ALKPHOS 30 (L) 03/04/2023 0242   BILITOT 2.3 (H) 03/04/2023 0242   GFRNONAA >60 03/06/2023 0258   GFRAA   12/29/2009 0625    >60        The eGFR has been calculated using the MDRD equation. This calculation has not been validated in all clinical situations. eGFR's persistently <60 mL/min signify possible Chronic Kidney Disease.   Lipase     Component Value Date/Time   LIPASE <10 (L) 03/03/2023 1048       Studies/Results: No results found.  Anti-infectives: Anti-infectives (From admission, onward)    None        Assessment/Plan 77 year old man with partial small bowel obstruction and subsequent dehydration, severe metabolic derangements on presentation.  -Follow up Xray 11/18 showed contrast in the colon and normal bowel gas pattern -NG was removed yesterday and patient started on clear liquids. He has not taken in much and has not had bowel function over night, but abdominal exam remains benign and he's had no n/v. Will continue clear liquid diet this morning. Mobilize.    Additional problems present on admission: Possible upper GI bleed-GI following, scheduled for EGD today. Hgb 7.4 << 7 << 8.5. s/p 1u PRBCs 11/18 Leukocytosis- resolved, WBC 8.6 A-fib/flutter with RVR and secondary hypotension Stress cardiomyopathy Acute kidney injury on chronic kidney disease-creatinine 1.16, resolved (1.8 on presentation) Hyponatremia-133 today Delirium History of aortic valve replacement Memory issues Seizures Chronic anticoagulation Cholelithiasis  Prior bilateral laparoscopic inguinal hernia repairs (2000), prior open sigmoid colectomy (2007) history of A-fib/AVR previously on warfarin history of sigmoid colectomy in 2007   ID - none FEN - IVF, CLD VTE - SCDs   I reviewed Consultant gastroenterology notes, last 24 h vitals and pain scores, last 48 h intake and output, and last 24 h labs and trends.    LOS: 3 days    Franne Forts, [facility] 03/06/2023, 9:03 AM Please see Amion for pager number during day hours 7:00am-4:30pm

## 2023-03-06 NOTE — Progress Notes (Signed)
PHARMACY - ANTICOAGULATION CONSULT NOTE  Pharmacy Consult for heparin Indication:  atrial fibrillation, mechanical AVR  Allergies  Allergen Reactions   Amoxicillin-Pot Clavulanate Rash   Codeine Other (See Comments)    Stomach upset    Patient Measurements: Height: 5\' 10"  (177.8 cm) Weight: 79.3 kg (174 lb 13.2 oz) IBW/kg (Calculated) : 73 Heparin Dosing Weight: TBW  Vital Signs: Temp: 100.8 F (38.2 C) (11/20 0445) Temp Source: Bladder (11/19 1940) BP: 101/61 (11/20 0445) Pulse Rate: 146 (11/20 0300)  Labs: Recent Labs    03/03/23 0843 03/03/23 1048 03/03/23 1404 03/04/23 0242 03/04/23 1455 03/05/23 0054 03/05/23 0957 03/05/23 1428 03/05/23 2244 03/06/23 0258  HGB 16.2  --    < > 8.5*   < > 7.4*   < > 7.5* 8.3* 8.1*  HCT 45.9  --    < > 23.8*   < > 22.5*   < > 23.1* 24.4* 24.4*  PLT 181  --    < > 128*  --  89*  --   --  73* 72*  LABPROT 30.3*  --    < > 30.3*  --  17.5*  --   --   --  14.5  INR 2.9*  --    < > 2.9*  --  1.4*  --   --   --  1.1  HEPARINUNFRC  --   --   --   --   --   --   --   --  0.11*  --   CREATININE 1.81*  --    < > 1.55*  --  1.34*  --   --   --  1.16  TROPONINIHS 104* 91*  --   --   --   --   --   --   --   --    < > = values in this interval not displayed.    Estimated Creatinine Clearance: 55.9 mL/min (by C-G formula based on SCr of 1.16 mg/dL).   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   Stricture of sigmoid colon s/p colectomy 2007 2007    Medications: Infusions:   heparin 1,450 Units/hr (03/06/23 0900)   levETIRAcetam 500 mg  (03/06/23 1004)   And   levETIRAcetam Stopped (03/05/23 2156)   ondansetron (ZOFRAN) IV       Assessment: 77 YO male presenting with a SBO and hematemesis. Patient has a significant cardiac history of atrial fibrillation and AVR on warfarin prior to admission. Last dose of warfarin 11/16 @0800 . INR was 2.9 on admission and increased up to 4.1 on 11/17 (goal INR 2.5-3.5)--warfarin has been held since admission and he was given IV vitamin K 10mg  x1 on 11/18 to get INR <2 for EGD. EGD 11/19 showed 2 large Mallory-Weiss tear with clots--3 endoclips have been placed to close mucosal defect, superficial fundic ulcers. Pharmacy consulted for heparin dosing with plan to restart warfarin in 3 days, per GI.  PTA warfarin regimen: 6mg  daily except 3mg  on Tuesdays.   Today, 03/06/23: Heparin level 0.18, subtherapeutic on heparin 1200 units/hr  INR 1.1, subtherapeutic while warfarin is on hold CBC:  Hgb 8.1, Plt down to 72k Bleeding / Complications: none reported  Goal of Therapy:  Heparin level 0.3-0.7 units/ml Monitor platelets by anticoagulation protocol: Yes   Plan:  No heparin bolus d/t recent bleeding Increase to heparin IV infusion at 1450 units/hr Heparin level 8 hours after starting Daily heparin level and CBC Plan to resume warfarin in 3 days (11/22)   Lynann Beaver PharmD, BCPS [facility] main pharmacy [phone] 03/06/2023 7:30 AM

## 2023-03-06 NOTE — Progress Notes (Signed)
PHARMACY - ANTICOAGULATION CONSULT NOTE  Pharmacy Consult for heparin Indication:  atrial fibrillation, mechanical AVR  Allergies  Allergen Reactions   Amoxicillin-Pot Clavulanate Rash   Codeine Other (See Comments)    Stomach upset    Patient Measurements: Height: 5\' 10"  (177.8 cm) Weight: 79.3 kg (174 lb 13.2 oz) IBW/kg (Calculated) : 73 Heparin Dosing Weight: TBW  Vital Signs: Temp: 99.1 F (37.3 C) (11/20 1230) Temp Source: Bladder (11/20 1200) BP: 115/54 (11/20 1500) Pulse Rate: 72 (11/20 1500)  Labs: Recent Labs    03/04/23 0242 03/04/23 1455 03/05/23 0054 03/05/23 0957 03/05/23 1428 03/05/23 2244 03/06/23 0258 03/06/23 0646 03/06/23 1601  HGB 8.5*   < > 7.4*   < > 7.5* 8.3* 8.1*  --   --   HCT 23.8*   < > 22.5*   < > 23.1* 24.4* 24.4*  --   --   PLT 128*  --  89*  --   --  73* 72*  --   --   LABPROT 30.3*  --  17.5*  --   --   --  14.5  --   --   INR 2.9*  --  1.4*  --   --   --  1.1  --   --   HEPARINUNFRC  --   --   --   --   --  0.11*  --  0.18* 0.17*  CREATININE 1.55*  --  1.34*  --   --   --  1.16  --   --    < > = values in this interval not displayed.    Estimated Creatinine Clearance: 55.9 mL/min (by C-G formula based on SCr of 1.16 mg/dL).   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   Stricture of sigmoid colon s/p colectomy 2007 2007    Medications: Infusions:   heparin 1,450 Units/hr (03/06/23 1500)   levETIRAcetam Stopped (03/06/23 1019)   And   levETIRAcetam Stopped (03/05/23 2156)   ondansetron (ZOFRAN) IV       Assessment: 77 YO  male presenting with a SBO and hematemesis. Patient has a significant cardiac history of atrial fibrillation and AVR on warfarin prior to admission. Last dose of warfarin 11/16 @0800 . INR was 2.9 on admission and increased up to 4.1 on 11/17 (goal INR 2.5-3.5)--warfarin has been held since admission and he was given IV vitamin K 10mg  x1 on 11/18 to get INR <2 for EGD. EGD 11/19 showed 2 large Mallory-Weiss tear with clots--3 endoclips have been placed to close mucosal defect, superficial fundic ulcers. Pharmacy consulted for heparin dosing with plan to restart warfarin in 3 days, per GI.  PTA warfarin regimen: 6mg  daily except 3mg  on Tuesdays.   Today, 03/06/23: Heparin level 0.17, subtherapeutic on heparin 1450 units/hr. RN says that heparin was paused today around noon for ~65min while blood cultures were being collected--no other interruptions reported.  INR 1.1, subtherapeutic while warfarin is on hold CBC:  Hgb 8.1, Plt down to 72k No s/sx of bleeding per RN  Goal of Therapy:  Heparin level  0.3-0.7 units/ml Monitor platelets by anticoagulation protocol: Yes   Plan:  No heparin bolus d/t recent bleeding Increase heparin gtt rate to 1650 units/hr Check heparin level 8 hours after rate adjustment  Daily heparin level and CBC Plan to resume warfarin in 3 days (11/22)   Cherylin Mylar, PharmD Clinical Pharmacist  11/20/20244:46 PM

## 2023-03-06 NOTE — Progress Notes (Signed)
PROGRESS NOTE    Alan Willis  ZOX:[id number] DOB: Oct 28, 1945 DOA: 03/03/2023 PCP: [name], MD   Brief Narrative:  77 yo M with mech AV on warfarin, p/w GI bleed, SBO, AKI and Afib RVR.   On admission required pressors due to hemorrhagic shock and Afib.  Transferred to ICU and required vasopressors. EGD on 11/19 showed bleeding likely from [facility] tear. Heparin resumed. AKI resolved. Cards, General surgery and GI on board.  Transferred to [facility] 03/06/2023.  Assessment & Plan:   Principal Problem:   GI bleed Active Problems:   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 Mallie Mussel)   Chronic anticoagulation   Memory loss   History of seizures   AKI (acute kidney injury) (HCC)   Gallstone   CKD (chronic kidney disease) stage 2, GFR 60-89 ml/min   Elevated troponin   Recurrent right inguinal hernia   Atrial fibrillation with RVR (HCC)   Diverticulosis of colon with hemorrhage   Small bowel obstruction (HCC)   Shock (HCC)   Small bowel obstruction: Surgery managing.  SBO appears to have resolved.  Tube removed 03/05/2023, he has been started on clear liquid diet.  He is asymptomatic.  Still not passing flatus though.  Hemorrhagic shock secondary to acute blood loss anemia secondary to upper GI bleed/Mallory-Weiss tear: Status post 3 units of PRBC transfusion, last on 03/05/2023 and 3 units of frozen plasma transfusion, last on 03/04/2023, status post EGD 11/19 with two large non-bleeding Mallory-Weiss tear with stigmata of recent bleeding and two large clots at site s/p clips x 3. Also two nonbleeding lineal and superficial gastric ulcers with a clean ulcer base in gastric fundus. S/p biopsy from duodenal bulb for severe mucosal changes with erythema, inflammation and nodularity.  GI recommends PPI BID x 2 months followed by daily GI ok to restart heparin 03/05/2023 with plan to resume warfarin in 72 hours on 03/08/2023.  Hemoglobin stable.   Monitor daily CBC.  History of mechanical aortic valve replacement on chronic anticoagulation with Coumadin/supratherapeutic INR: Currently on heparin  Permanent atrial fibrillation with RVR on admission: On Tikosyn PTA, was unable to take for several days prior to admission.  Resumed Tikosyn.  Currently in sinus rhythm.  Cardiology managing.  Fever: Patient has been having persistent low-grade fever since afternoon of 03/05/2023.  Patient has no symptoms suggesting of any particular infection.  Due to history of mechanical valve replacement, will check blood culture, procalcitonin and lactic acid.  Currently afebrile.  Monitor closely.  Delirium/seizures/memory issues: Continue Keppra, seizure precautions, aspiration precautions.  Currently fully alert and oriented.  I turned on all the lights in his room and advised his wife as well as primary RN at the bedside to do so during the daytime to avoid delirium. 1. Avoid benzodiazepines, antihistamines, anticholinergics, and minimize opiate use as these may worsen delirium. 2: Assess, prevent and manage pain as lack of treatment can result in delirium.  3: Provide appropriate lighting and clear signage; a clock and calendar should be easily visible to the patient. 4: Monitor environmental factors. Reduce light and noise at night (close shades, turn off lights, turn off TV, ect). Correct any alterations in sleep cycle. 5: Reorient the patient to person, place, time and situation on each encounter.  6: Correct sensory deficits if possible (replace eye glasses, hearing aids, ect). 7: Avoid restraints if able. Severely delirious patients benefit from constant observation by a sitter.  Hypophosphatemia: Will replenish.  Acute hyponatremia:  Likely hypovolemic.  Presented with 127, went as low as 125 but currently 133.  DVT prophylaxis: SCDs Start: 03/03/23 1402 heparin   Code Status: Full Code  Family Communication: Wife present at bedside.  Plan of  care discussed with patient in length and he/she verbalized understanding and agreed with it.  Status is: Inpatient Remains inpatient appropriate because: Patient with multiple active issues being managed and needs to be in the hospital.   Estimated body mass index is 25.08 kg/m as calculated from the following:   Height as of this encounter: 5\' 10"  (1.778 m).   Weight as of this encounter: 79.3 kg.    Nutritional Assessment: Body mass index is 25.08 kg/m.Marland Kitchen Seen by dietician.  I agree with the assessment and plan as outlined below: Nutrition Status:        . Skin Assessment: I have examined the patient's skin and I agree with the wound assessment as performed by the wound care RN as outlined below:    Consultants:  GI, general surgery and cardiology  Procedures:  As above  Antimicrobials:  Anti-infectives (From admission, onward)    None         Subjective: Patient seen and examined.  Wife at the bedside.  Patient has no complaints.  He is fully alert and oriented.  Objective: Vitals:   03/06/23 0211 03/06/23 0245 03/06/23 0300 03/06/23 0445  BP:  112/82 (!) 84/51 101/61  Pulse: (!) 145 89 (!) 146   Resp: (!) 29 17 (!) 25 19  Temp: (!) 100.4 F (38 C) (!) 100.6 F (38.1 C) (!) 100.6 F (38.1 C) (!) 100.8 F (38.2 C)  TempSrc:      SpO2: 95%  90%   Weight:      Height:        Intake/Output Summary (Last 24 hours) at 03/06/2023 0801 Last data filed at 03/06/2023 0658 Gross per 24 hour  Intake 1240 ml  Output 1350 ml  Net -110 ml   Filed Weights   03/03/23 0813 03/03/23 1320  Weight: 79.4 kg 79.3 kg    Examination:  General exam: Appears calm and comfortable  Respiratory system: Clear to auscultation. Respiratory effort normal. Cardiovascular system: S1 & S2 heard, RRR. No JVD, murmurs, rubs, gallops or clicks. No pedal edema. Gastrointestinal system: Abdomen is nondistended, soft and nontender. No organomegaly or masses felt. Normal bowel  sounds heard. Central nervous system: Alert and oriented. No focal neurological deficits. Extremities: Symmetric 5 x 5 power. Skin: No rashes, lesions or ulcers Psychiatry: Judgement and insight appear normal. Mood & affect appropriate.    Data Reviewed: I have personally reviewed following labs and imaging studies  CBC: Recent Labs  Lab 03/03/23 0843 03/03/23 1404 03/03/23 1922 03/04/23 0012 03/04/23 0242 03/04/23 1455 03/05/23 0054 03/05/23 0957 03/05/23 1428 03/05/23 2244 03/06/23 0258  WBC 21.7*  --  33.7*  --  23.0*  --  11.1*  --   --  8.3 8.6  NEUTROABS 18.6*  --  29.8*  --   --   --   --   --   --   --   --   HGB 16.2   < > 11.6*   < > 8.5*   < > 7.4* 7.4* 7.5* 8.3* 8.1*  HCT 45.9   < > 33.6*   < > 23.8*   < > 22.5* 22.6* 23.1* 24.4* 24.4*  MCV 87.1  --  92.1  --  90.8  --  94.1  --   --  93.1 95.7  PLT 181  --  155  --  128*  --  89*  --   --  73* 72*   < > = values in this interval not displayed.   Basic Metabolic Panel: Recent Labs  Lab 03/03/23 1404 03/03/23 1922 03/04/23 0242 03/05/23 0054 03/06/23 0258  NA 126* 126* 125* 132* 133*  K 4.4 4.2 4.4 4.0 3.5  CL 84* 89* 90* 99 103  CO2 28 24 28 28  20*  GLUCOSE 146* 150* 151* 92 94  BUN 50* 49* 46* 35* 19  CREATININE 1.76* 1.56* 1.55* 1.34* 1.16  CALCIUM 9.0 8.2* 8.1* 8.0* 7.9*  MG 2.0  --   --  2.3 2.1  PHOS 4.0  --   --   --  1.7*   GFR: Estimated Creatinine Clearance: 55.9 mL/min (by C-G formula based on SCr of 1.16 mg/dL). Liver Function Tests: Recent Labs  Lab 03/03/23 0843 03/04/23 0242  AST 26 26  ALT 13 13  ALKPHOS 52 30*  BILITOT 2.5* 2.3*  PROT 8.0 5.6*  ALBUMIN 4.8 3.6   Recent Labs  Lab 03/03/23 1048  LIPASE <10*   No results for input(s): "AMMONIA" in the last 168 hours. Coagulation Profile: Recent Labs  Lab 03/03/23 0843 03/03/23 1922 03/04/23 0242 03/05/23 0054 03/06/23 0258  INR 2.9* 4.1* 2.9* 1.4* 1.1   Cardiac Enzymes: No results for input(s): "CKTOTAL",  "CKMB", "CKMBINDEX", "TROPONINI" in the last 168 hours. BNP (last 3 results) No results for input(s): "PROBNP" in the last 8760 hours. HbA1C: No results for input(s): "HGBA1C" in the last 72 hours. CBG: No results for input(s): "GLUCAP" in the last 168 hours. Lipid Profile: No results for input(s): "CHOL", "HDL", "LDLCALC", "TRIG", "CHOLHDL", "LDLDIRECT" in the last 72 hours. Thyroid Function Tests: No results for input(s): "TSH", "T4TOTAL", "FREET4", "T3FREE", "THYROIDAB" in the last 72 hours. Anemia Panel: No results for input(s): "VITAMINB12", "FOLATE", "FERRITIN", "TIBC", "IRON", "RETICCTPCT" in the last 72 hours. Sepsis Labs: Recent Labs  Lab 03/03/23 1636 03/03/23 1922 03/04/23 0012 03/04/23 0242  LATICACIDVEN 1.9 2.2* 2.3* 2.2*    Recent Results (from the past 240 hour(s))  MRSA Next Gen by PCR, Nasal     Status: None   Collection Time: 03/04/23  2:30 AM   Specimen: Nasal Mucosa; Nasal Swab  Result Value Ref Range Status   MRSA by PCR Next Gen NOT DETECTED NOT DETECTED Final    Comment: (NOTE) The GeneXpert MRSA Assay (FDA approved for NASAL specimens only), is one component of a comprehensive MRSA colonization surveillance program. It is not intended to diagnose MRSA infection nor to guide or monitor treatment for MRSA infections. Test performance is not FDA approved in patients less than 21 years old. Performed at [facility], [address], [place], [place] [address]      Radiology Studies: DG Abd Portable 1V-Small Bowel Obstruction Protocol-initial, 8 hr delay  Result Date: 03/04/2023 CLINICAL DATA:  Follow-up SBO, 8 hour delayed film EXAM: PORTABLE ABDOMEN - 1 VIEW COMPARISON:  03/03/2023 FINDINGS: Enteric tube terminates in the proximal gastric body. No findings suspicious for small bowel obstruction. Contrast opacifies the colon at least to the splenic flexure and likely to the rectum. Prior bilateral inguinal hernia mesh repair. Vascular  calcifications. IMPRESSION: Enteric tube terminates in the proximal gastric body. No findings suspicious for small bowel obstruction. Contrast to the left colon, as above. Electronically Signed   By: Charline Bills M.D.   On: 03/04/2023 11:56  Scheduled Meds:  sodium chloride   Intravenous Once   Chlorhexidine Gluconate Cloth  6 each Topical Daily   dofetilide  250 mcg Oral BID   lidocaine  1 Application Topical Once   pantoprazole (PROTONIX) IV  40 mg Intravenous Q6H   Followed by   Melene Muller ON 03/07/2023] pantoprazole (PROTONIX) IV  40 mg Intravenous Q12H   potassium chloride  40 mEq Oral Once   Continuous Infusions:  heparin 1,200 Units/hr (03/06/23 1610)   levETIRAcetam Stopped (03/05/23 1024)   And   levETIRAcetam Stopped (03/05/23 2156)   ondansetron (ZOFRAN) IV       LOS: 3 days   Alan Closs, MD [facility]  03/06/2023, 8:01 AM   *Please note that this is a verbal dictation therefore any spelling or grammatical errors are due to the "Dragon Medical One" system interpretation.  Please page via Amion and do not message via secure chat for urgent patient care matters. Secure chat can be used for non urgent patient care matters.  How to contact the [facility] Attending or Consulting provider 7A - 7P or covering provider during after hours 7P -7A, for this patient?  Check the care team in [facility] and look for a) attending/consulting TR[facility] provider listed and b) the [facility] team listed. Page or secure chat 7A-7P. Log into [email or web address] and use [place]'s universal password to access. If you do not have the password, please contact the hospital operator. Locate the [facility] provider you are looking for under [facility] and page to a number that you can be directly reached. If you still have difficulty reaching the provider, please page the [facility] (Director on Call) for the Hospitalists listed on amion for assistance.

## 2023-03-06 NOTE — Progress Notes (Signed)
PHARMACY - ANTICOAGULATION CONSULT NOTE  Pharmacy Consult for Heparin Indication: mechanical AVR and Afib  Allergies  Allergen Reactions   Amoxicillin-Pot Clavulanate Rash   Codeine Other (See Comments)    Stomach upset    Patient Measurements: Height: 5\' 10"  (177.8 cm) Weight: 79.3 kg (174 lb 13.2 oz) IBW/kg (Calculated) : 73 Heparin Dosing Weight: 79.3 kg  Vital Signs: Temp: 100.4 F (38 C) (11/19 1940) Temp Source: Bladder (11/19 1940) BP: 83/55 (11/19 1940) Pulse Rate: 119 (11/19 1940)  Labs: Recent Labs    03/03/23 0843 03/03/23 1048 03/03/23 1404 03/03/23 1922 03/04/23 0012 03/04/23 0242 03/04/23 1455 03/05/23 0054 03/05/23 0957 03/05/23 1428 03/05/23 2244  HGB 16.2  --    < > 11.6*   < > 8.5*   < > 7.4* 7.4* 7.5* 8.3*  HCT 45.9  --    < > 33.6*   < > 23.8*   < > 22.5* 22.6* 23.1* 24.4*  PLT 181  --   --  155  --  128*  --  89*  --   --  73*  LABPROT 30.3*  --   --  40.2*  --  30.3*  --  17.5*  --   --   --   INR 2.9*  --   --  4.1*  --  2.9*  --  1.4*  --   --   --   HEPARINUNFRC  --   --   --   --   --   --   --   --   --   --  0.11*  CREATININE 1.81*  --    < > 1.56*  --  1.55*  --  1.34*  --   --   --   TROPONINIHS 104* 91*  --   --   --   --   --   --   --   --   --    < > = values in this interval not displayed.    Estimated Creatinine Clearance: 48.4 mL/min (A) (by C-G formula based on SCr of 1.34 mg/dL (H)).   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   Stricture of sigmoid colon s/p colectomy 2007 2007   Assessment: AC/Heme: Warfarin HELD  for GIB >> hep gtt 11/19 - Hx mechanical AVR and Afib on PTA warfarin 6mg  daily except 3mg  on Tuesdays.  LD on 11/16.  - INR 2.9 on admit.  Goal INR 2.5-3.5 per anticoag office notes Deboraha Sprang at [facility] 02/13/23) - Vit K given 11/18 for goal INR <2 to start heparin, per GI - Hep gtt starting 11/19 with plan to switch to warfarin on 11/22 - Hep level 0.11 below goal. Hgb 8.3, Plts down to 73. No bleeding noted by RN.  Goal of Therapy:  Heparin level 0.3-0.7 units/ml Monitor platelets by anticoagulation protocol: Yes   Plan:  Increase IV heparin to 1200 units/hr Recheck heparin level 6 hrs after rate increase Daily HL and CBC  Margart Zemanek S. Merilynn Finland, PharmD, BCPS Clinical Staff Pharmacist [email or web address] Merilynn Finland, [employer]  03/06/2023,12:10 AM

## 2023-03-06 NOTE — Plan of Care (Signed)
  Problem: Clinical Measurements: Goal: Ability to maintain clinical measurements within normal limits will improve Outcome: Progressing Goal: Diagnostic test results will improve Outcome: Progressing Goal: Respiratory complications will improve Outcome: Progressing   Problem: Education: Goal: Knowledge of General Education information will improve Description Including pain rating scale, medication(s)/side effects and non-pharmacologic comfort measures Outcome: Not Progressing

## 2023-03-06 NOTE — Progress Notes (Addendum)
Progress Note  Patient Name: Alan Willis Date of Encounter: 03/06/2023  Primary Cardiologist: [facility]  Subjective   No complaints. Was unaware of recurrent A-flutter overnight. No CP or SOB. Denies any known recurrent bleeding.  Inpatient Medications    Scheduled Meds:  sodium chloride   Intravenous Once   Chlorhexidine Gluconate Cloth  6 each Topical Daily   dofetilide  250 mcg Oral BID   lidocaine  1 Application Topical Once   pantoprazole (PROTONIX) IV  40 mg Intravenous Q6H   Followed by   Melene Muller ON 03/07/2023] pantoprazole (PROTONIX) IV  40 mg Intravenous Q12H   Continuous Infusions:  heparin 1,200 Units/hr (03/06/23 0272)   levETIRAcetam Stopped (03/05/23 1024)   And   levETIRAcetam Stopped (03/05/23 2156)   ondansetron (ZOFRAN) IV     PRN Meds: acetaminophen, acetaminophen, alum & mag hydroxide-simeth, bisacodyl, fentaNYL (SUBLIMAZE) injection, haloperidol lactate, magic mouthwash, menthol-cetylpyridinium, methocarbamol (ROBAXIN) injection, naphazoline-glycerin, ondansetron (ZOFRAN) IV **OR** ondansetron (ZOFRAN) IV, mouth rinse, phenol, prochlorperazine, sodium chloride   Vital Signs    Vitals:   03/06/23 0211 03/06/23 0245 03/06/23 0300 03/06/23 0445  BP:  112/82 (!) 84/51 101/61  Pulse: (!) 145 89 (!) 146   Resp: (!) 29 17 (!) 25 19  Temp: (!) 100.4 F (38 C) (!) 100.6 F (38.1 C) (!) 100.6 F (38.1 C) (!) 100.8 F (38.2 C)  TempSrc:      SpO2: 95%  90%   Weight:      Height:        Intake/Output Summary (Last 24 hours) at 03/06/2023 0746 Last data filed at 03/06/2023 5366 Gross per 24 hour  Intake 1378.76 ml  Output 1350 ml  Net 28.76 ml      03/03/2023    1:20 PM 03/03/2023    8:13 AM 12/19/2022    7:55 AM  Last 3 Weights  Weight (lbs) 174 lb 13.2 oz 175 lb 176 lb 12.8 oz  Weight (kg) 79.3 kg 79.379 kg 80.196 kg     Telemetry    Paroxysmal atrial flutter on/off overnight, brief junctional brady following one conversion to NSR,  otherwise NSR with PACs this AM - Personally Reviewed  ECG    NSR 86bpm, first degree AVB, QTc - Personally Reviewed  Physical Exam   GEN: No acute distress.  HEENT: Normocephalic, atraumatic, sclera non-icteric. Neck: No JVD or bruits. Cardiac: RRR rare PAC, 3/6 SEM without rubs or gallops.  Respiratory: Clear to auscultation bilaterally. Breathing is unlabored. GI: Soft, nontender, non-distended, BS +x 4. MS: no deformity. Extremities: No clubbing or cyanosis. No edema. Distal pedal pulses are 2+ and equal bilaterally. Neuro:  AAOx3. Follows commands. Psych:  Responds to questions appropriately with a normal affect.  Labs    High Sensitivity Troponin:   Recent Labs  Lab 03/03/23 0843 03/03/23 1048  TROPONINIHS 104* 91*      Cardiac EnzymesNo results for input(s): "TROPONINI" in the last 168 hours. No results for input(s): "TROPIPOC" in the last 168 hours.   Chemistry Recent Labs  Lab 03/03/23 0843 03/03/23 1404 03/04/23 0242 03/05/23 0054 03/06/23 0258  NA 127*   < > 125* 132* 133*  K 4.3   < > 4.4 4.0 3.5  CL 82*   < > 90* 99 103  CO2 31   < > 28 28 20*  GLUCOSE 136*   < > 151* 92 94  BUN 42*   < > 46* 35* 19  CREATININE 1.81*   < > 1.55*  1.34* 1.16  CALCIUM 9.6   < > 8.1* 8.0* 7.9*  PROT 8.0  --  5.6*  --   --   ALBUMIN 4.8  --  3.6  --   --   AST 26  --  26  --   --   ALT 13  --  13  --   --   ALKPHOS 52  --  30*  --   --   BILITOT 2.5*  --  2.3*  --   --   GFRNONAA 38*   < > 46* 55* >60  ANIONGAP 14   < > 7 5 10    < > = values in this interval not displayed.     Hematology Recent Labs  Lab 03/05/23 0054 03/05/23 0957 03/05/23 1428 03/05/23 2244 03/06/23 0258  WBC 11.1*  --   --  8.3 8.6  RBC 2.39*  --   --  2.62* 2.55*  HGB 7.4*   < > 7.5* 8.3* 8.1*  HCT 22.5*   < > 23.1* 24.4* 24.4*  MCV 94.1  --   --  93.1 95.7  MCH 31.0  --   --  31.7 31.8  MCHC 32.9  --   --  34.0 33.2  RDW 14.1  --   --  13.7 13.6  PLT 89*  --   --  73* 72*    < > = values in this interval not displayed.    BNPNo results for input(s): "BNP", "PROBNP" in the last 168 hours.   DDimer No results for input(s): "DDIMER" in the last 168 hours.   Radiology    DG Abd Portable 1V-Small Bowel Obstruction Protocol-initial, 8 hr delay  Result Date: 03/04/2023 CLINICAL DATA:  Follow-up SBO, 8 hour delayed film EXAM: PORTABLE ABDOMEN - 1 VIEW COMPARISON:  03/03/2023 FINDINGS: Enteric tube terminates in the proximal gastric body. No findings suspicious for small bowel obstruction. Contrast opacifies the colon at least to the splenic flexure and likely to the rectum. Prior bilateral inguinal hernia mesh repair. Vascular calcifications. IMPRESSION: Enteric tube terminates in the proximal gastric body. No findings suspicious for small bowel obstruction. Contrast to the left colon, as above. Electronically Signed   By: Charline Bills M.D.   On: 03/04/2023 11:56    Cardiac Studies   2d echo 07/2022  SUMMARY  Left ventricular systolic function is normal.  LV ejection fraction = 55-60%.  No segmental wall motion abnormalities seen in the left ventricle  Moderate left ventricular hypertrophy  The right ventricle is normal in size and function.  The left atrium is severely dilated.  The right atrium is mildly dilated.  There is a mechanical aortic valve.  Aortic valve mean pressure gradient is 24 mmHg.  There is trace aortic regurgitation.  There is mild mitral stenosis.  There is moderate mitral regurgitation.  Estimated right ventricular systolic pressure is 30-35 mmHg.  There is no pericardial effusion.  Compared to the last study dated 06-13-2021, there is no interval change in LV function or mitral  valve function. The aortic valve mean gradient appears a bit higher today  now 24 mmHg, previously  14 mmHg .  -   Patient Profile     77 y.o. male with atrial fibrillation/flutter (s/p multiple cardioversions, on Tikosyn), aortic valve replacement (1983  [facility]) on warfarin, mild mitral stenosis and moderate mitral regurgitation by echo 07/2022, abdominal aortic atherosclerosis, seizures, CKD stage II, diverticulosis, cholelithiasis, right inguinal hernia admitted with coffee ground  emesis, diarrhea with melena, inability to tolerate medications, hemorrhagic shock, GIB, SBO, AKI. Cardiology consulted for atrial flutter, back in NSR with PACs/PVCs on telemetry at time of consult 11/18. Underwent UGI 11/19 with Mallory Weiss tear, clips placed, nonbleeding gastric ulcer, mucosal changes in duodenum. Went back into AF RVR with hypotension 11/19 PM.  Assessment & Plan     1. Hemorrhagic shock, SBO, UGIB, anemia, thrombocytopenia - UGI which showed Mallory Weiss tear, clips placed, nonbleeding gastric ulcer, mucosal changes in duodenum.  - GI recommended BID PPI x 2 months then once a day indefinitely, resumed heparin drip, and recommended to resume Coumadin 3 days post-UGI - pressors d/c'd 11/19 - received additional unit of blood last night in setting of tachycardia/hypotension - Hgb 8.1, plt 72 today - he continues to be intermittently febrile, ?obtain blood cx given valvular disease - further per IM/GI  2. Paroxysmal atrial fib/flutter, brief junctional rhythm upon one of the conversions to NSR with PACs - on Tikosyn PTA, unable to take several days prior to admission  - anticoag plans as above, following blood counts carefully - recurrent AF last night with brief junctional rhythm upon one of the conversions to NSR with PACs - Dr. Duke Salvia resumed Tikosyn BID - need to follow K, Mg closely per Tikosyn guidelines - K 3.5, will supp x1 now, Mg at goal  3. 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 AKI on CKD III, hyponatremia - Cr, Na improving   For questions or updates, please contact [facility] Please consult [email or web address] for contact info under Cardiology/STEMI.  Signed, Laurann Montana, PA-C 03/06/2023, 7:46 AM

## 2023-03-06 NOTE — Progress Notes (Signed)
PT Cancellation Note  Patient Details Name: Alan Willis MRN: [id number] DOB: 07-09-1945   Cancelled Treatment:    Reason Eval/Treat Not Completed: Other (comment)patient  just  amb in room with RN and back in bed. Will check back another time. Blanchard Kelch PT Acute Rehabilitation Services Office [phone] Weekend pager-[phone]   Rada Hay 03/06/2023, 2:48 PM

## 2023-03-07 ENCOUNTER — Inpatient Hospital Stay (HOSPITAL_COMMUNITY): Payer: Medicare Other

## 2023-03-07 DIAGNOSIS — K56609 Unspecified intestinal obstruction, unspecified as to partial versus complete obstruction: Secondary | ICD-10-CM | POA: Diagnosis not present

## 2023-03-07 DIAGNOSIS — I5031 Acute diastolic (congestive) heart failure: Secondary | ICD-10-CM | POA: Diagnosis not present

## 2023-03-07 DIAGNOSIS — Z7901 Long term (current) use of anticoagulants: Secondary | ICD-10-CM | POA: Diagnosis not present

## 2023-03-07 DIAGNOSIS — I5033 Acute on chronic diastolic (congestive) heart failure: Secondary | ICD-10-CM | POA: Diagnosis not present

## 2023-03-07 DIAGNOSIS — K920 Hematemesis: Secondary | ICD-10-CM | POA: Diagnosis not present

## 2023-03-07 DIAGNOSIS — I509 Heart failure, unspecified: Secondary | ICD-10-CM

## 2023-03-07 DIAGNOSIS — I4891 Unspecified atrial fibrillation: Secondary | ICD-10-CM | POA: Diagnosis not present

## 2023-03-07 LAB — HEPARIN LEVEL (UNFRACTIONATED)
Heparin Unfractionated: 0.29 [IU]/mL — ABNORMAL LOW (ref 0.30–0.70)
Heparin Unfractionated: 0.41 [IU]/mL (ref 0.30–0.70)
Heparin Unfractionated: 0.35 [IU]/mL (ref 0.30–0.70)

## 2023-03-07 LAB — BASIC METABOLIC PANEL
Anion gap: 9 (ref 5–15)
BUN: 13 mg/dL (ref 8–23)
CO2: 21 mmol/L — ABNORMAL LOW (ref 22–32)
Calcium: 7.9 mg/dL — ABNORMAL LOW (ref 8.9–10.3)
Chloride: 98 mmol/L (ref 98–111)
Creatinine, Ser: 1.02 mg/dL (ref 0.61–1.24)
GFR, Estimated: >60 mL/min (ref >60)
Glucose, Bld: 128 mg/dL — ABNORMAL HIGH (ref 70–99)
Potassium: 3.7 mmol/L (ref 3.5–5.1)
Sodium: 128 mmol/L — ABNORMAL LOW (ref 135–145)

## 2023-03-07 LAB — ECHOCARDIOGRAM COMPLETE
AR max vel: 1.23 cm2
AV Area VTI: 1.42 cm2
AV Area mean vel: 1.19 cm2
AV Mean grad: 9 mm[Hg]
AV Peak grad: 16 mm[Hg]
Ao pk vel: 2 m/s
Area-P 1/2: 3.74 cm2
Calc EF: 37.4 %
Height: 70 in
MV M vel: 4.81 m/s
MV Peak grad: 92.5 mm[Hg]
MV VTI: 1.08 cm2
Radius: 0.4 cm
S' Lateral: 4.3 cm
Single Plane A2C EF: 40.7 %
Single Plane A4C EF: 37.2 %
Weight: 2797.2 [oz_av]

## 2023-03-07 LAB — CBC
HCT: 24.6 % — ABNORMAL LOW (ref 39.0–52.0)
Hemoglobin: 8.2 g/dL — ABNORMAL LOW (ref 13.0–17.0)
MCH: 31.5 pg (ref 26.0–34.0)
MCHC: 33.3 g/dL (ref 30.0–36.0)
MCV: 94.6 fL (ref 80.0–100.0)
Platelets: 117 10*3/uL — ABNORMAL LOW (ref 150–400)
RBC: 2.6 MIL/uL — ABNORMAL LOW (ref 4.22–5.81)
RDW: 13.7 % (ref 11.5–15.5)
WBC: 15.3 10*3/uL — ABNORMAL HIGH (ref 4.0–10.5)
nRBC: 0.1 % (ref 0.0–0.2)

## 2023-03-07 LAB — PHOSPHORUS: Phosphorus: 1.6 mg/dL — ABNORMAL LOW (ref 2.5–4.6)

## 2023-03-07 LAB — MAGNESIUM: Magnesium: 1.9 mg/dL (ref 1.7–2.4)

## 2023-03-07 LAB — PROTIME-INR
INR: 1.1 (ref 0.8–1.2)
Prothrombin Time: 14.2 s (ref 11.4–15.2)

## 2023-03-07 LAB — BRAIN NATRIURETIC PEPTIDE: B Natriuretic Peptide: 1402.9 pg/mL — ABNORMAL HIGH (ref 0.0–100.0)

## 2023-03-07 MED ORDER — POTASSIUM CHLORIDE CRYS ER 20 MEQ PO TBCR
40.0000 meq | EXTENDED_RELEASE_TABLET | Freq: Once | ORAL | Status: AC
  Administered 2023-03-07: 40 meq via ORAL
  Filled 2023-03-07: qty 2

## 2023-03-07 MED ORDER — FUROSEMIDE 10 MG/ML IJ SOLN: 40.0000 mg | Freq: Once | INTRAMUSCULAR | Status: DC

## 2023-03-07 MED ORDER — FUROSEMIDE 10 MG/ML IJ SOLN
40.0000 mg | Freq: Two times a day (BID) | INTRAMUSCULAR | Status: AC
  Administered 2023-03-07 – 2023-03-08 (×4): 40 mg via INTRAVENOUS
  Filled 2023-03-07 (×4): qty 4

## 2023-03-07 MED ORDER — METOPROLOL TARTRATE 25 MG PO TABS
25.0000 mg | ORAL_TABLET | Freq: Two times a day (BID) | ORAL | Status: DC
  Administered 2023-03-07 – 2023-03-16 (×12): 25 mg via ORAL
  Filled 2023-03-07 (×17): qty 1

## 2023-03-07 MED ORDER — MAGNESIUM SULFATE 2 GM/50ML IV SOLN
2.0000 g | Freq: Once | INTRAVENOUS | Status: AC
  Administered 2023-03-07: 2 g via INTRAVENOUS
  Filled 2023-03-07: qty 50

## 2023-03-07 MED ORDER — MAGNESIUM SULFATE IN D5W 1-5 GM/100ML-% IV SOLN
1.0000 g | Freq: Once | INTRAVENOUS | Status: DC
  Filled 2023-03-07: qty 100

## 2023-03-07 MED ORDER — MAGNESIUM SULFATE 50 % IJ SOLN
1.0000 g | Freq: Once | INTRAMUSCULAR | Status: DC
  Filled 2023-03-07: qty 2

## 2023-03-07 NOTE — Evaluation (Signed)
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  Clinical Impression  Pt admitted with above diagnosis.  Pt currently with functional limitations due to the deficits listed below (see PT Problem List). Pt will benefit from acute skilled PT to increase their independence and safety with mobility to allow discharge.     Te patient is resting in bed on 6 L HFNC, 100%. Patient ambulated with 2 person HHA x 80', 2/4 dyspnea. Note 92% Lowest  sat on 6 LPM.  Patient  is independent PTA,. Patient not oriented to date and events.  Patient should progress to return home with supportive family.      If plan is discharge home, recommend the following: A little help with walking and/or transfers;A little help with bathing/dressing/bathroom;Assistance with cooking/housework;Assist for transportation;Help with stairs or ramp for entrance   Can travel by private vehicle    yes    Equipment Recommendations  none  Recommendations for Other Services    ot   Functional Status Assessment Patient has had a recent decline in their functional status and demonstrates the ability to make significant improvements in function in a reasonable and predictable amount of time.     Precautions / Restrictions Precautions Precautions: Fall Precaution Comments: monitor sats, HR Restrictions Weight Bearing  Restrictions: No      Mobility  Bed Mobility Overal bed mobility: Needs Assistance Bed Mobility: Supine to Sit     Supine to sit: Supervision          Transfers Overall transfer level: Needs assistance   Transfers: Sit to/from Stand Sit to Stand: Supervision                Ambulation/Gait Ambulation/Gait assistance: Min assist Gait Distance (Feet): 80 Feet Assistive device: 2 person hand held assist Gait Pattern/deviations: Step-through pattern Gait velocity: decr     General Gait Details: gait mildly unsteady required 2 HHA for stability  Stairs            Wheelchair Mobility     Tilt Bed    Modified Rankin (Stroke Patients Only)       Balance Overall balance assessment: Needs assistance Sitting-balance support: No upper extremity supported, Feet supported Sitting balance-Leahy Scale: Good     Standing balance support: No upper extremity supported, During functional activity Standing balance-Leahy Scale: Poor                               Pertinent Vitals/Pain Pain Assessment Pain Assessment: No/denies pain    Home Living Family/patient expects to be discharged to:: Private residence Living Arrangements: Spouse/significant other Available Help at Discharge: Family;Available 24 hours/day Type of Home: House Home Access: Stairs to enter   [employer] of Steps: 2   Home Layout: One level Home Equipment: None      Prior Function Prior Level of Function : Independent/Modified Independent  Extremity/Trunk Assessment   Upper Extremity Assessment Upper Extremity Assessment: Overall WFL for tasks assessed    Lower Extremity Assessment Lower Extremity Assessment: Generalized weakness    Cervical / Trunk Assessment Cervical / Trunk Assessment: Normal  Communication   Communication Communication: No apparent difficulties  Cognition Arousal: Alert Behavior During Therapy: WFL  for tasks assessed/performed Overall Cognitive Status: Impaired/Different from baseline Area of Impairment: Orientation, Memory                 Orientation Level: Place, Time, Situation             General Comments: alert, follows directions        General Comments      Exercises     Assessment/Plan    PT Assessment Patient needs continued PT services  PT Problem List Decreased strength;Decreased knowledge of precautions;Decreased mobility;Cardiopulmonary status limiting activity;Decreased activity tolerance       PT Treatment Interventions DME instruction;Therapeutic activities;Gait training;Therapeutic exercise;Patient/family education;Functional mobility training    PT Goals (Current goals can be found in the Care Plan section)  Acute Rehab PT Goals Patient Stated Goal: go home, walk the dogs PT Goal Formulation: With patient/family Time For Goal Achievement: 03/21/23 Potential to Achieve Goals: Good    Frequency Min 1X/week     Co-evaluation               AM-PAC PT "6 Clicks" Mobility  Outcome Measure Help needed turning from your back to your side while in a flat bed without using bedrails?: None Help needed moving from lying on your back to sitting on the side of a flat bed without using bedrails?: A Little Help needed moving to and from a bed to a chair (including a wheelchair)?: A Little Help needed standing up from a chair using your arms (e.g., wheelchair or bedside chair)?: A Little Help needed to walk in hospital room?: A Little Help needed climbing 3-5 steps with a railing? : A Lot 6 Click Score: 18    End of Session Equipment Utilized During Treatment: Gait belt;Oxygen Activity Tolerance: Patient tolerated treatment well Patient left: in chair;with call bell/phone within reach;with family/visitor present Nurse Communication: Mobility status PT Visit Diagnosis: Unsteadiness on feet (R26.81);Difficulty in walking, not elsewhere  classified (R26.2)    Time: 1610-9604 PT Time Calculation (min) (ACUTE ONLY): 26 min   Charges:   PT Evaluation $PT Eval Low Complexity: 1 Low PT Treatments $Gait Training: 8-22 mins PT General Charges $$ ACUTE PT VISIT: 1 Visit         Blanchard Kelch PT Acute Rehabilitation Services Office [phone] Weekend pager-[phone]   Rada Hay 03/07/2023, 4:50 PM

## 2023-03-07 NOTE — Progress Notes (Signed)
PHARMACY - ANTICOAGULATION CONSULT NOTE  Pharmacy Consult for heparin Indication:  atrial fibrillation, mechanical AVR  Allergies  Allergen Reactions   Amoxicillin-Pot Clavulanate Rash   Codeine Other (See Comments)    Stomach upset    Patient Measurements: Height: 5\' 10"  (177.8 cm) Weight: 79.3 kg (174 lb 13.2 oz) IBW/kg (Calculated) : 73 Heparin Dosing Weight: TBW  Vital Signs: Temp: 97.9 F (36.6 C) (11/21 1900) Temp Source: Oral (11/21 1900) BP: 106/68 (11/21 1800) Pulse Rate: 107 (11/21 1800)  Labs: Recent Labs    03/05/23 0054 03/05/23 0957 03/05/23 2244 03/06/23 0258 03/06/23 0646 03/07/23 0127 03/07/23 1140 03/07/23 2025  HGB 7.4*   < > 8.3* 8.1*  --  8.2*  --   --   HCT 22.5*   < > 24.4* 24.4*  --  24.6*  --   --   PLT 89*  --  73* 72*  --  117*  --   --   LABPROT 17.5*  --   --  14.5  --  14.2  --   --   INR 1.4*  --   --  1.1  --  1.1  --   --   HEPARINUNFRC  --   --  0.11*  --    < > 0.29* 0.41 0.35  CREATININE 1.34*  --   --  1.16  --  1.02  --   --    < > = values in this interval not displayed.    Estimated Creatinine Clearance: 63.6 mL/min (by C-G formula based on SCr of 1.02 mg/dL).   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 Patient has a  significant cardiac history of atrial fibrillation (s/p TEE guided DCCV 07/2019) and AVR  [facility] 1983) on chronic warfarin anticoagulation. Last dose of warfarin 11/16 @0800 . INR was 2.9 on admission and increased up to 4.1 on 11/17 -  he was given IV vitamin K 10mg  x1 on 11/18 to get INR <2 for EGD 11/19 which showed 2 large Mallory-Weiss tear with clots--3 endoclips have been placed to close mucosal defect, superficial fundic ulcers. Pharmacy consulted for heparin dosing on 11/19 with plan to restart warfarin in 3 days per GI on 11/22.   PTA warfarin regimen: 6mg  daily except 3mg  on Tuesdays.  (goal INR 2.5-3.5 per outpatient notes)  Today, 03/07/23, PM:   Heparin level remains therapeutic on current IV heparin rate of 1750 units/hr NO reported bleeding or issues  Goal of Therapy:  Heparin level 0.3-0.7 units/ml Monitor platelets by anticoagulation protocol: Yes   Plan:  No heparin bolus d/t recent bleeding Continue heparin 1750 units/hr  Daily heparin level and CBC Follow up plans to resume warfarin tomorrow (11/22)   Hessie Knows,  PharmD, BCPS Secure Chat if ?s 03/07/2023 9:11 PM

## 2023-03-07 NOTE — Progress Notes (Signed)
   Reviewed EKG this afternoon which showed atrial flutter, rate 103bpm. QTc was automatically read as 516 ms. However, it does not look this long. Reviewed with Dr. Duke Salvia and it looks like computer is over-reading this due to flutter waves. Per Dr. Duke Salvia, OK to give evening dose of Tikosyn. Called and spoke with RN - asked for night staff to repeat EKG 2 hours after evening dose of Tikosyn.  Corrin Parker, PA-C 03/07/2023 6:37 PM

## 2023-03-07 NOTE — Progress Notes (Signed)
PHARMACY - ANTICOAGULATION CONSULT NOTE  Pharmacy Consult for heparin Indication: mechanical AVR and Afib   Allergies  Allergen Reactions   Amoxicillin-Pot Clavulanate Rash   Codeine Other (See Comments)    Stomach upset    Patient Measurements: Height: 5\' 10"  (177.8 cm) Weight: 79.3 kg (174 lb 13.2 oz) IBW/kg (Calculated) : 73 Heparin Dosing Weight: 79.3 kg  Vital Signs: Temp: 98.9 F (37.2 C) (11/21 0000) Temp Source: Oral (11/21 0000) BP: 92/54 (11/21 0200) Pulse Rate: 69 (11/21 0200)  Labs: Recent Labs    03/05/23 0054 03/05/23 0957 03/05/23 2244 03/06/23 0258 03/06/23 0646 03/06/23 1601 03/07/23 0127  HGB 7.4*   < > 8.3* 8.1*  --   --  8.2*  HCT 22.5*   < > 24.4* 24.4*  --   --  24.6*  PLT 89*  --  73* 72*  --   --  117*  LABPROT 17.5*  --   --  14.5  --   --  14.2  INR 1.4*  --   --  1.1  --   --  1.1  HEPARINUNFRC  --    < > 0.11*  --  0.18* 0.17* 0.29*  CREATININE 1.34*  --   --  1.16  --   --  1.02   < > = values in this interval not displayed.    Estimated Creatinine Clearance: 63.6 mL/min (by C-G formula based on SCr of 1.02 mg/dL).   Medical History: Past Medical History:  Diagnosis Date   A-fib [facility])    Atrial flutter with rapid ventricular response (HCC) 03/16/2021   Cholelithiasis 2011   Chronic anticoagulation 03/03/2023   Colon, diverticulosis    H/O heart valve replacement with mechanical valve 03/03/2023   Bjork-Shiley tilting disc valve in 1983 at [facility] in [place], [place].        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 Hgb 8.2 stable. Plts 117 up.  Goal of Therapy:  Heparin level 0.3-0.7 units/ml Monitor platelets by anticoagulation protocol: Yes   Plan:  - Increase IV heparin to 1750  units/hr Recheck heparin level in 6 hrs. Daily HL and CBC   Laksh Hinners S. Merilynn Finland, PharmD, BCPS Clinical Staff Pharmacist [email or web address] Merilynn Finland, [employer] 03/07/2023,2:47 AM

## 2023-03-07 NOTE — Progress Notes (Signed)
PROGRESS NOTE    Alan Willis  QIO:[id number] DOB: [birth year] DOA: 03/03/2023 PCP: [name], MD   Brief Narrative:  77 yo M with mech AV on warfarin, p/w GI bleed, SBO, AKI and Afib RVR.   On admission required pressors due to hemorrhagic shock and Afib.  Transferred to ICU and required vasopressors. EGD on 11/19 showed bleeding likely from [facility] tear. Heparin resumed. AKI resolved. Cards, General surgery and GI on board.  Transferred to [facility] 03/06/2023.  Assessment & Plan:   Principal Problem:   GI bleed Active Problems:   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 Mallie Mussel)   Chronic anticoagulation   Memory loss   History of seizures   AKI (acute kidney injury) (HCC)   Gallstone   CKD (chronic kidney disease) stage 2, GFR 60-89 ml/min   Elevated troponin   Recurrent right inguinal hernia   Atrial fibrillation with RVR (HCC)   Diverticulosis of colon with hemorrhage   Small bowel obstruction (HCC)   Shock (HCC)   Small bowel obstruction: Surgery managing.  SBO appears to have resolved.  Tube removed 03/05/2023, he has been started on clear liquid diet.  He is asymptomatic.  Still not passing flatus though.  Hemorrhagic shock secondary to acute blood loss anemia secondary to upper GI bleed/Mallory-Weiss tear: Status post 3 units of PRBC transfusion, last on 03/05/2023 and 3 units of frozen plasma transfusion, last on 03/04/2023, status post EGD 11/19 with two large non-bleeding Mallory-Weiss tear with stigmata of recent bleeding and two large clots at site s/p clips x 3. Also two nonbleeding lineal and superficial gastric ulcers with a clean ulcer base in gastric fundus. S/p biopsy from duodenal bulb for severe mucosal changes with erythema, inflammation and nodularity.  GI recommends PPI BID x 2 months followed by daily GI ok to restart heparin 03/05/2023 with plan to resume warfarin in 72 hours on 03/08/2023.  Hemoglobin stable.   Monitor daily CBC.  History of mechanical aortic valve replacement on chronic anticoagulation with Coumadin/supratherapeutic INR: Currently on heparin  Permanent atrial fibrillation with RVR on admission: On Tikosyn PTA, was unable to take for several days prior to admission.  Resumed Tikosyn.  Goes in and out of rapid A-fib.  Cardiology managing.  Fever: Patient has been having persistent low-grade fever since afternoon of 03/05/2023.  Patient has no symptoms suggesting of any particular infection.  Due to history of mechanical valve replacement, will check blood culture, procalcitonin and lactic acid.  Currently afebrile.  Monitor closely.  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  Despite of this much of oxygen, he is comfortable.  Chest x-ray was only ordered by general surgery and I personally reviewed it while in patient's room and this showed pulmonary edema, while radiology report was pending.  He did have very faint crackles and diminished breath sounds at the left base.  Consistent with CHF, I ordered 1 dose of IV Lasix 40 mg.  Discussed with cardiology PA that this is likely CHF and he might need more diuretics, likely 40 mg IV twice daily.  Strict I's and O's.  Wean oxygen as able to.  Last echo done at [facility] in April of this year shows normal ejection fraction.  I have ordered another echo for him to update his EF.  Delirium/seizures/memory issues: Continue Keppra, seizure precautions, aspiration precautions.  Currently fully alert and oriented.  I turned on all the lights in his room and advised his wife as well as primary RN at the bedside to do so during the daytime to avoid delirium. 1. Avoid benzodiazepines, antihistamines, anticholinergics, and minimize opiate use as these may worsen delirium. 2:  Assess, prevent and manage pain as lack of treatment can result in delirium.  3: Provide appropriate lighting and clear signage; a clock and calendar should be easily visible to the patient. 4: Monitor environmental factors. Reduce light and noise at night (close shades, turn off lights, turn off TV, ect). Correct any alterations in sleep cycle. 5: Reorient the patient to person, place, time and situation on each encounter.  6: Correct sensory deficits if possible (replace eye glasses, hearing aids, ect). 7: Avoid restraints if able. Severely delirious patients benefit from constant observation by a sitter.  Hypophosphatemia: Replenished yesterday, will recheck today.  Acute hyponatremia: Likely hypovolemic.  Presented with 127, went as low as 125 but currently 128.  DVT prophylaxis: SCDs Start: 03/03/23 1402 heparin   Code Status: Full Code  Family Communication: Wife present at bedside.  Plan of care discussed with patient and his wife in length and he/she verbalized understanding and agreed with it.  Status is: Inpatient Remains inpatient appropriate because: Patient with multiple active issues being managed and needs to be in the hospital.   Estimated body mass index is 25.08 kg/m as calculated from the following:   Height as of this encounter: 5\' 10"  (1.778 m).   Weight as of this encounter: 79.3 kg.    Nutritional Assessment: Body mass index is 25.08 kg/m.Marland Kitchen Seen by dietician.  I agree with the assessment and plan as outlined below: Nutrition Status:        . Skin Assessment: I have examined the patient's skin and I agree with the wound assessment as performed by the wound care RN as outlined below:    Consultants:  GI, general surgery and cardiology  Procedures:  As above  Antimicrobials:  Anti-infectives (From admission, onward)    None         Subjective: Seen and examined.  Wife at the bedside.  Despite of high amount of oxygen requirement, he denies  any shortness of breath, chest pain or palpitation.  Objective: Vitals:   03/07/23 0821 03/07/23 0900 03/07/23 1000 03/07/23 1031  BP:   122/72   Pulse: (!) 49  66 78  Resp: (!) 32 (!) 34 (!) 27 (!) 31  Temp:      TempSrc:      SpO2: 94%  (!) 88% 90%  Weight:      Height:        Intake/Output Summary (Last 24 hours) at 03/07/2023 1131 Last data filed at 03/07/2023 1610 Gross per 24 hour  Intake 676.61 ml  Output --  Net 676.61 ml   Filed Weights   03/03/23 0813 03/03/23 1320  Weight: 79.4 kg 79.3 kg    Examination:  General exam: Appears calm and comfortable  Respiratory system: Faint crackles at the bases bilaterally and diminished breath sounds at the left base. Respiratory effort normal. Cardiovascular system: S1 & S2 heard, irregularly irregular RR. No JVD, murmurs, rubs, gallops or clicks. No pedal edema. Gastrointestinal system: Abdomen is nondistended, soft and nontender. No organomegaly or masses felt. Normal bowel sounds heard. Central nervous system: Alert and oriented. No focal neurological deficits. Extremities: Symmetric 5 x 5 power.  Skin: No rashes, lesions or ulcers.  Psychiatry: Judgement and insight appear normal. Mood & affect appropriate.   Data Reviewed: I have personally reviewed following labs and imaging studies  CBC: Recent Labs  Lab 03/03/23 0843 03/03/23 1404 03/03/23 1922 03/04/23 0012 03/04/23 0242 03/04/23 1455 03/05/23 0054 03/05/23 0957 03/05/23 1428 03/05/23 2244 03/06/23 0258 03/07/23 0127  WBC 21.7*  --  33.7*  --  23.0*  --  11.1*  --   --  8.3 8.6 15.3*  NEUTROABS 18.6*  --  29.8*  --   --   --   --   --   --   --   --   --   HGB 16.2   < > 11.6*   < > 8.5*   < > 7.4* 7.4* 7.5* 8.3* 8.1* 8.2*  HCT 45.9   < > 33.6*   < > 23.8*   < > 22.5* 22.6* 23.1* 24.4* 24.4* 24.6*  MCV 87.1  --  92.1  --  90.8  --  94.1  --   --  93.1 95.7 94.6  PLT 181  --  155  --  128*  --  89*  --   --  73* 72* 117*   < > = values in this interval  not displayed.   Basic Metabolic Panel: Recent Labs  Lab 03/03/23 1404 03/03/23 1922 03/04/23 0242 03/05/23 0054 03/06/23 0258 03/07/23 0127  NA 126* 126* 125* 132* 133* 128*  K 4.4 4.2 4.4 4.0 3.5 3.7  CL 84* 89* 90* 99 103 98  CO2 28 24 28 28  20* 21*  GLUCOSE 146* 150* 151* 92 94 128*  BUN 50* 49* 46* 35* 19 13  CREATININE 1.76* 1.56* 1.55* 1.34* 1.16 1.02  CALCIUM 9.0 8.2* 8.1* 8.0* 7.9* 7.9*  MG 2.0  --   --  2.3 2.1 1.9  PHOS 4.0  --   --   --  1.7*  --    GFR: Estimated Creatinine Clearance: 63.6 mL/min (by C-G formula based on SCr of 1.02 mg/dL). Liver Function Tests: Recent Labs  Lab 03/03/23 0843 03/04/23 0242  AST 26 26  ALT 13 13  ALKPHOS 52 30*  BILITOT 2.5* 2.3*  PROT 8.0 5.6*  ALBUMIN 4.8 3.6   Recent Labs  Lab 03/03/23 1048  LIPASE <10*   No results for input(s): "AMMONIA" in the last 168 hours. Coagulation Profile: Recent Labs  Lab 03/03/23 1922 03/04/23 0242 03/05/23 0054 03/06/23 0258 03/07/23 0127  INR 4.1* 2.9* 1.4* 1.1 1.1   Cardiac Enzymes: No results for input(s): "CKTOTAL", "CKMB", "CKMBINDEX", "TROPONINI" in the last 168 hours. BNP (last 3 results) No results for input(s): "PROBNP" in the last 8760 hours. HbA1C: No results for input(s): "HGBA1C" in the last 72 hours. CBG: No results for input(s): "GLUCAP" in the last 168 hours. Lipid Profile: No results for input(s): "CHOL", "HDL", "LDLCALC", "TRIG", "CHOLHDL", "LDLDIRECT" in the last 72 hours. Thyroid Function Tests: No results for input(s): "TSH", "T4TOTAL", "FREET4", "T3FREE", "THYROIDAB" in the last 72 hours. Anemia Panel: No results for input(s): "VITAMINB12", "FOLATE", "FERRITIN", "TIBC", "IRON", "RETICCTPCT" in the last 72 hours. Sepsis Labs: Recent Labs  Lab 03/03/23 1922 03/04/23 0012 03/04/23 0242 03/06/23 1052  PROCALCITON  --   --   --  0.20  LATICACIDVEN 2.2* 2.3* 2.2* 1.0    Recent Results (from the past 240 hour(s))  MRSA Next Gen by PCR, Nasal      Status: None   Collection Time: 03/04/23  2:30 AM   Specimen: Nasal Mucosa; Nasal Swab  Result Value Ref Range Status   MRSA by PCR Next Gen NOT DETECTED NOT DETECTED Final    Comment: (NOTE) The GeneXpert MRSA Assay (FDA approved for NASAL specimens only), is one component of a comprehensive MRSA colonization surveillance program. It is not intended to diagnose MRSA infection nor to guide or monitor treatment for MRSA infections. Test performance is not FDA approved in patients less than 83 years old. Performed at [facility], [address] [place], [place] [address]   Culture, blood (Routine X 2) w Reflex to ID Panel     Status: None (Preliminary result)   Collection Time: 03/06/23 10:52 AM   Specimen: BLOOD  Result Value Ref Range Status   Specimen Description   Final    BLOOD BLOOD LEFT ARM Performed at [facility], [address] [place], [place] [address]    Special Requests   Final    BOTTLES DRAWN AEROBIC AND ANAEROBIC Blood Culture adequate volume Performed at [facility], [address], [place], [place] [address]    Culture   Final    NO GROWTH < 24 HOURS Performed at [facility], [address], [place], [place] [address]    Report Status PENDING  Incomplete  Culture, blood (Routine X 2) w Reflex to ID Panel     Status: None (Preliminary result)   Collection Time: 03/06/23 11:08 AM   Specimen: BLOOD  Result Value Ref Range Status   Specimen Description   Final    BLOOD SITE NOT SPECIFIED Performed at [facility], [address], [place], [place] [address]    Special Requests   Final    BOTTLES DRAWN AEROBIC ONLY Blood Culture results may not be optimal due to an inadequate volume of blood received in culture bottles Performed at [facility], [address] [place], [place] [address]    Culture   Final    NO GROWTH < 24 HOURS Performed at [facility], [address], [place], [place] [address]    Report Status PENDING  Incomplete     Radiology Studies: DG CHEST PORT 1 VIEW  Result Date: 03/07/2023 CLINICAL DATA:  77 year old male with hypoxia. EXAM: PORTABLE CHEST 1 VIEW COMPARISON:  Portable chest 03/03/2023 and earlier. FINDINGS: Portable AP semi upright view at 0851 hours. Naso enteric tube removed. Stable lung volumes and mediastinal contours but new patchy and indistinct perihilar and interstitial opacity. Evidence of a small left pleural effusion. No pneumothorax. No air bronchograms. No right pleural effusion. Negative visible bowel gas. IMPRESSION: 1. Nasoenteric tube removed. 2. New perihilar and interstitial opacity with evidence of a small left pleural effusion. Favor acute pulmonary edema. Atypical/viral respiratory infection felt less likely. Electronically Signed   By: Odessa Fleming M.D.   On: 03/07/2023 09:42    Scheduled Meds:  sodium chloride   Intravenous Once   Chlorhexidine Gluconate Cloth  6 each Topical Daily   dofetilide  250 mcg Oral BID   feeding supplement  1 Container Oral TID BM   furosemide  40 mg Intravenous BID   lidocaine  1 Application Topical Once   pantoprazole (PROTONIX) IV  40 mg Intravenous Q12H   potassium chloride  40 mEq Oral Once   Continuous Infusions:  heparin 1,750 Units/hr (03/07/23 0822)   levETIRAcetam Stopped (03/07/23 1047)   And   levETIRAcetam Stopped (03/06/23 2245)  magnesium sulfate bolus IVPB 2 g (03/07/23 1051)   ondansetron (ZOFRAN) IV       LOS: 4 days   Hughie Closs, MD [facility]  03/07/2023, 11:31 AM   *Please note that this is a verbal dictation therefore any spelling or grammatical errors are due to the "Dragon Medical One" system interpretation.  Please page via Amion and do not message via secure chat for urgent patient care matters. Secure chat can be used for non urgent patient care matters.  How to contact the [facility] Attending or Consulting  provider 7A - 7P or covering provider during after hours 7P -7A, for this patient?  Check the care team in [facility] and look for a) attending/consulting TR[facility] provider listed and b) the [facility] team listed. Page or secure chat 7A-7P. Log into [email or web address] and use [facility]'s universal password to access. If you do not have the password, please contact the hospital operator. Locate the [facility] provider you are looking for under [facility] and page to a number that you can be directly reached. If you still have difficulty reaching the provider, please page the [facility] (Director on Call) for the Hospitalists listed on amion for assistance.

## 2023-03-07 NOTE — Progress Notes (Signed)
Rounding Note    Patient Name: Alan Willis Date of Encounter: 03/07/2023  [facility] Cardiologist: None   Subjective   He is requiring 8-10L of O2 this morning which is new. However, he does not feel short of breath. His wife does notice that he looks to be breathing more shallow today. No chest pain. He remains in rapid atrial fibrillation but is unaware of this - no palpitations.   Inpatient Medications    Scheduled Meds:  sodium chloride   Intravenous Once   Chlorhexidine Gluconate Cloth  6 each Topical Daily   dofetilide  250 mcg Oral BID   feeding supplement  1 Container Oral TID BM   lidocaine  1 Application Topical Once   pantoprazole (PROTONIX) IV  40 mg Intravenous Q6H   Followed by   pantoprazole (PROTONIX) IV  40 mg Intravenous Q12H   potassium chloride  40 mEq Oral Once   Continuous Infusions:  heparin 1,750 Units/hr (03/07/23 0960)   levETIRAcetam Stopped (03/06/23 1019)   And   levETIRAcetam Stopped (03/06/23 2245)   ondansetron (ZOFRAN) IV     PRN Meds: acetaminophen, acetaminophen, alum & mag hydroxide-simeth, bisacodyl, fentaNYL (SUBLIMAZE) injection, haloperidol lactate, magic mouthwash, menthol-cetylpyridinium, methocarbamol (ROBAXIN) injection, naphazoline-glycerin, ondansetron (ZOFRAN) IV **OR** ondansetron (ZOFRAN) IV, mouth rinse, phenol, prochlorperazine, sodium chloride   Vital Signs    Vitals:   03/07/23 0700 03/07/23 0800 03/07/23 0810 03/07/23 0821  BP: (!) 114/55     Pulse: (!) 48 (!) 120 (!) 111 (!) 49  Resp: (!) 26 (!) 26 (!) 27 (!) 32  Temp:  97.7 F (36.5 C)    TempSrc:  Oral    SpO2: 95% 90% 99% 94%  Weight:      Height:        Intake/Output Summary (Last 24 hours) at 03/07/2023 0858 Last data filed at 03/07/2023 4540 Gross per 24 hour  Intake 695.96 ml  Output --  Net 695.96 ml      03/03/2023    1:20 PM 03/03/2023    8:13 AM 12/19/2022    7:55 AM  Last 3 Weights  Weight (lbs) 174 lb 13.2 oz 175 lb  176 lb 12.8 oz  Weight (kg) 79.3 kg 79.379 kg 80.196 kg      Telemetry    Atrial fibrillation with rates currently in the low 100s to 110s (although rates as high as the 160s to 170s overnight). PVC with some bigeminy and brief NSVT (3 beats noted). - Personally Reviewed  ECG    Atrial fibrillation, rate 99, with PVC and non-specific ST/ T wave changes. Qtc 469 ms.  - Personally Reviewed  Physical Exam   GEN: No acute distress.  On 8-10L of HFNC. Neck: JVD elevated with positive hepatojugular reflux. Cardiac: Tachycardic with irregularly irregular rhythm. Mechanical click noted. II/VI noted at apex.  Respiratory: No significant increased work of breathing. Decreased breath sounds in bilateral bases with very mild crackles. MS: No lower extremity edema. No deformity. Neuro:  No focal deficits. Psych: Normal affect. Responds appropriately.  Labs    High Sensitivity Troponin:   Recent Labs  Lab 03/03/23 0843 03/03/23 1048  TROPONINIHS 104* 91*     Chemistry Recent Labs  Lab 03/03/23 0843 03/03/23 1404 03/04/23 0242 03/05/23 0054 03/06/23 0258 03/07/23 0127  NA 127*   < > 125* 132* 133* 128*  K 4.3   < > 4.4 4.0 3.5 3.7  CL 82*   < > 90* 99 103 98  CO2 31   < > 28 28 20* 21*  GLUCOSE 136*   < > 151* 92 94 128*  BUN 42*   < > 46* 35* 19 13  CREATININE 1.81*   < > 1.55* 1.34* 1.16 1.02  CALCIUM 9.6   < > 8.1* 8.0* 7.9* 7.9*  MG  --    < >  --  2.3 2.1 1.9  PROT 8.0  --  5.6*  --   --   --   ALBUMIN 4.8  --  3.6  --   --   --   AST 26  --  26  --   --   --   ALT 13  --  13  --   --   --   ALKPHOS 52  --  30*  --   --   --   BILITOT 2.5*  --  2.3*  --   --   --   GFRNONAA 38*   < > 46* 55* >60 >60  ANIONGAP 14   < > 7 5 10 9    < > = values in this interval not displayed.    Lipids No results for input(s): "CHOL", "TRIG", "HDL", "LABVLDL", "LDLCALC", "CHOLHDL" in the last 168 hours.  Hematology Recent Labs  Lab 03/05/23 2244 03/06/23 0258 03/07/23 0127  WBC  8.3 8.6 15.3*  RBC 2.62* 2.55* 2.60*  HGB 8.3* 8.1* 8.2*  HCT 24.4* 24.4* 24.6*  MCV 93.1 95.7 94.6  MCH 31.7 31.8 31.5  MCHC 34.0 33.2 33.3  RDW 13.7 13.6 13.7  PLT 73* 72* 117*   Thyroid No results for input(s): "TSH", "FREET4" in the last 168 hours.  BNPNo results for input(s): "BNP", "PROBNP" in the last 168 hours.  DDimer No results for input(s): "DDIMER" in the last 168 hours.   Radiology    No results found.  Cardiac Studies   TTE 07/16/2022 ([facility]): Summary: Left ventricular systolic function is normal.  LV ejection fraction = 55-60%.  No segmental wall motion abnormalities seen in the left ventricle  Moderate left ventricular hypertrophy  The right ventricle is normal in size and function.  The left atrium is severely dilated.  The right atrium is mildly dilated.  There is a mechanical aortic valve.  Aortic valve mean pressure gradient is 24 mmHg.  There is trace aortic regurgitation.  There is mild mitral stenosis.  There is moderate mitral regurgitation.  Estimated right ventricular systolic pressure is 30-35 mmHg.  There is no pericardial effusion.  Compared to the last study dated 06-13-2021, there is no interval change in LV function or mitral  valve function. The aortic valve mean gradient appears a bit higher today  now 24 mmHg, previously  14 mmHg .   Patient Profile     77 y.o. 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 Cardiology was consulted on 03/04/2023 for further evaluation of atrial flutter with RVR.    Assessment & Plan    Paroxysmal Atrial Fibrillation/ Flutter Patient has a history of atrial fibrillation/ flutter s/p multiple DCCVs in this past  and has been managed with Tikosyn. He was admitted with hemorrhagic shock secondary to a Mallory Weiss tear as well as a small bowel obstruction. Hospitalization has been complicated by intermittent atrial flutter with RVR. Tikosyn was initially held as he was unable to tolerate oral medications given SBO; however, this was restarted on 03/05/2023. - Currently in atrial fibrillation with rates in the low 100s to 110s. However, rates were as high as the 160s to 170s overnight and earlier this morning. - Potassium 3.7 today and Magnesium 1.9. Will replete both. Goal is to keep Potassium >4.0 and Magnesium >2.0. - Continue Tikosyn load with twice daily. He received his 5th dose this morning. - Will hold off on beta-blocker for now given decompensated CHF and brief junctional rhythm upon one of the conversions to NSR. - On chronic anticoagulation with Coumadin at home. However, this was stopped on admission in setting of hemorrhagic shock. GI recommended holding Coumadin for 3 days after EGD on 03/05/2023. He has been started back on IV Heparin. Hemoglobin stable. INR 1.1 today.  Acute on Chronic Diastolic CHF Patient requiring 8-10L of HFNC this morning which is new. No fever is the last 24 hours although he does have a new WBC of 15.3 this morning.  Procalcitonin was negative yesterday. Chest x-ray favored acute pulmonary edema. Suspect this is due to significant volume resuscitation earlier this admission in setting of hemorrhagic shock and now rapid atrial fibrillation/ flutter. Last Echo in 07/2022 at [facility] showed LVEF of 55-60%.  - He does have some decreased breath sounds and very mild crackles in bases. JVD also looks mildly up with positive hepatojugular reflux. No significant edema.   - Will check BNP. - Repeat Echo  pending. - Dr. Jacqulyn Bath has ordered one dose of IV Lasix. Agree with this. He will likely need more given his degree of hypoxia. Will start IV Lasix 40mg  twice daily.  - Will continue to hold home Spironolactone for now given soft BP at times to allow for more IV diuresis. However, will hopefully be able to add this back prior to discharge.  Demand Ischemia High-sensitivity troponin 104 >> 91. No acute ischemic changes on EKG. - No chest pain.  - Troponin elevation consistent with demand ischemia in setting of acute illness. No plans for ischemic evaluation at this time.  History of Mechanical AVR Moderate MR/ Mild MS History of remote mechanical AVR in 1983. Last Echo in 07/2022 at [facility] showed mechanical AVR with mean gradient of 24 mmHg and rheumatic valvular disease with moderate MR and mild MS. - Can reassess on repeat Echo.  - On Coumadin at home. Currently on hold given  hemorrhagic shock. GI recommended holding Coumadin for 3 days after EGD on 03/05/2023. He has been started back on IV Heparin.   Hypertension BP soft at time but stable.   Acute on CKD Stage III Creatinine peaked at 1.81 this admission in setting of hemorrhagic shock. Renal function has now normalized.  - Creatinine 1.02 today. - Continue to monitor closely with diuresis.  Otherwise, per primary team: - Hemorrhagic shock secondary to Mallory-Weiss tear - Small bowel obstruction - Fever  - Hyponatremia - Seizure disorder   For questions or updates, please contact [facility] Please consult [email or web address] for contact info under        Signed, Corrin Parker, PA-C  03/07/2023, 8:58 AM

## 2023-03-07 NOTE — Plan of Care (Signed)

## 2023-03-07 NOTE — Progress Notes (Signed)
Echocardiogram 2D Echocardiogram has been performed.  Alan Willis 03/07/2023, 2:49 PM

## 2023-03-07 NOTE — Progress Notes (Signed)
Patient ID: Alan Willis, male   DOB: Mar 19, 1946, 77 y.o.   MRN: [id number] [facility] Surgery Progress Note  2 Days Post-Op  Subjective: CC-  No abdominal complaints this morning. Tolerating some clear liquids. Denies n/v. Passing flatus and had a few loose stools yesterday.  WBC up 15.3, afebrile. Requiring more supplemental oxygen this morning.  Objective: Vital signs in last 24 hours: Temp:  [98.1 F (36.7 C)-99.3 F (37.4 C)] 98.1 F (36.7 C) (11/21 0400) Pulse Rate:  [29-156] 49 (11/21 0821) Resp:  [12-40] 32 (11/21 0821) BP: (75-138)/(35-113) 114/55 (11/21 0700) SpO2:  [82 %-100 %] 94 % (11/21 0821) Last BM Date : 03/07/23  Intake/Output from previous day: 11/20 0701 - 11/21 0700 In: 712.1 [I.V.:382.6; IV Piggyback:329.5] Out: -  Intake/Output this shift: Total I/O In: 23.5 [I.V.:23.5] Out: -   PE: Gen:  Alert, NAD, pleasant Card:  tachy Pulm: rate and effort normal on HFNC Abd: Soft, mild distension, nontender, +BS  Lab Results:  Recent Labs    03/06/23 0258 03/07/23 0127  WBC 8.6 15.3*  HGB 8.1* 8.2*  HCT 24.4* 24.6*  PLT 72* 117*   BMET Recent Labs    03/06/23 0258 03/07/23 0127  NA 133* 128*  K 3.5 3.7  CL 103 98  CO2 20* 21*  GLUCOSE 94 128*  BUN 19 13  CREATININE 1.16 1.02  CALCIUM 7.9* 7.9*   PT/INR Recent Labs    03/06/23 0258 03/07/23 0127  LABPROT 14.5 14.2  INR 1.1 1.1   CMP     Component Value Date/Time   NA 128 (L) 03/07/2023 0127   K 3.7 03/07/2023 0127   CL 98 03/07/2023 0127   CO2 21 (L) 03/07/2023 0127   GLUCOSE 128 (H) 03/07/2023 0127   BUN 13 03/07/2023 0127   CREATININE 1.02 03/07/2023 0127   CALCIUM 7.9 (L) 03/07/2023 0127   PROT 5.6 (L) 03/04/2023 0242   ALBUMIN 3.6 03/04/2023 0242   AST 26 03/04/2023 0242   ALT 13 03/04/2023 0242   ALKPHOS 30 (L) 03/04/2023 0242   BILITOT 2.3 (H) 03/04/2023 0242   GFRNONAA >60 03/07/2023 0127   GFRAA  12/29/2009 0625    >60        The eGFR has been  calculated using the MDRD equation. This calculation has not been validated in all clinical situations. eGFR's persistently <60 mL/min signify possible Chronic Kidney Disease.   Lipase     Component Value Date/Time   LIPASE <10 (L) 03/03/2023 1048       Studies/Results: No results found.  Anti-infectives: Anti-infectives (From admission, onward)    None        Assessment/Plan 77 year old man with partial small bowel obstruction and subsequent dehydration, severe metabolic derangements on presentation.  -Follow up Xray 11/18 showed contrast in the colon and normal bowel gas pattern -Tolerating clears and having bowel function. Advance to full liquids, and as tolerated to regular diet. Patient requiring more O2 this morning, discussed with RN and no concerns for aspiration with PO intake at this time but she will monitor closely. Consider SLP eval if any concerns. I have ordered a chest xray and messaged primary team. Obstruction seems to be resolving. No indication for surgical intervention. We will sign off, please call with questions or concerns.   Additional problems present on admission: Upper GI bleed-s/p EGD with Large mallory weiss tear treated with clip placement. H/h stable today Leukocytosis A-fib/flutter with RVR and secondary hypotension Stress cardiomyopathy Acute kidney injury  on chronic kidney disease-creatinine 1.02, resolved (1.8 on presentation) Hyponatremia-128 today Delirium History of aortic valve replacement Memory issues Seizures Chronic anticoagulation Cholelithiasis Prior bilateral laparoscopic inguinal hernia repairs (2000), prior open sigmoid colectomy (2007) history of A-fib/AVR previously on warfarin history of sigmoid colectomy in 2007   ID - none FEN - IVF, CLD VTE - SCDs   I reviewed hospitalist notes, last 24 h vitals and pain scores, last 48 h intake and output, and last 24 h labs and trends.    LOS: 4 days    Franne Forts, [facility] Surgery 03/07/2023, 8:39 AM Please see Amion for pager number during day hours 7:00am-4:30pm

## 2023-03-07 NOTE — Progress Notes (Signed)
PHARMACY - ANTICOAGULATION CONSULT NOTE  Pharmacy Consult for heparin Indication:  atrial fibrillation, mechanical AVR  Allergies  Allergen Reactions   Amoxicillin-Pot Clavulanate Rash   Codeine Other (See Comments)    Stomach upset    Patient Measurements: Height: 5\' 10"  (177.8 cm) Weight: 79.3 kg (174 lb 13.2 oz) IBW/kg (Calculated) : 73 Heparin Dosing Weight: TBW  Vital Signs: Temp: 98.1 F (36.7 C) (11/21 0400) Temp Source: Oral (11/21 0400) BP: 114/66 (11/21 0600) Pulse Rate: 119 (11/21 0600)  Labs: Recent Labs    03/05/23 0054 03/05/23 0957 03/05/23 2244 03/06/23 0258 03/06/23 0646 03/06/23 1601 03/07/23 0127  HGB 7.4*   < > 8.3* 8.1*  --   --  8.2*  HCT 22.5*   < > 24.4* 24.4*  --   --  24.6*  PLT 89*  --  73* 72*  --   --  117*  LABPROT 17.5*  --   --  14.5  --   --  14.2  INR 1.4*  --   --  1.1  --   --  1.1  HEPARINUNFRC  --    < > 0.11*  --  0.18* 0.17* 0.29*  CREATININE 1.34*  --   --  1.16  --   --  1.02   < > = values in this interval not displayed.    Estimated Creatinine Clearance: 63.6 mL/min (by C-G formula based on SCr of 1.02 mg/dL).   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   Stricture of sigmoid colon s/p colectomy 2007 2007    Medications: Infusions:   heparin 1,750 Units/hr (03/07/23 0700)   levETIRAcetam Stopped (03/06/23 1019)   And   levETIRAcetam Stopped (03/06/23 2245)   ondansetron (ZOFRAN) IV       Assessment: 77 YO male presenting with a SBO and hematemesis. Patient has a significant cardiac history of atrial  fibrillation (s/p TEE guided DCCV 07/2019) and AVR  [facility] 1983) on chronic warfarin anticoagulation. Last dose of warfarin 11/16 @0800 . INR was 2.9 on admission and increased up to 4.1 on 11/17 -  he was given IV vitamin K 10mg  x1 on 11/18 to get INR <2 for EGD 11/19 which showed 2 large Mallory-Weiss tear with clots--3 endoclips have been placed to close mucosal defect, superficial fundic ulcers. Pharmacy consulted for heparin dosing on 11/19 with plan to restart warfarin in 3 days per GI on 11/22.   PTA warfarin regimen: 6mg  daily except 3mg  on Tuesdays.  (goal INR 2.5-3.5 per outpatient notes)  Today, 03/07/23: Heparin level 0.41, therapeutic on heparin 1750 units/hr. (Due at 9am, but not drawn until 11:40) INR 1.1, subtherapeutic while warfarin is on hold CBC:  Hgb 8.2 remains low/stable, Plt increased to 117 No s/sx of bleeding per RN, and no IV site issues.    Goal of Therapy:  Heparin level 0.3-0.7 units/ml Monitor platelets by anticoagulation protocol: Yes   Plan:  No heparin bolus d/t recent bleeding Continue heparin 1750 units/hr  Check confirmatory heparin level 8 hours  Daily heparin level and CBC Follow up  plans to resume warfarin tomorrow (11/22)   Lynann Beaver PharmD, BCPS [facility] main pharmacy [phone] 03/07/2023 7:11 AM

## 2023-03-08 ENCOUNTER — Encounter (HOSPITAL_COMMUNITY): Payer: Self-pay | Admitting: Gastroenterology

## 2023-03-08 DIAGNOSIS — I5033 Acute on chronic diastolic (congestive) heart failure: Secondary | ICD-10-CM | POA: Diagnosis not present

## 2023-03-08 DIAGNOSIS — I4891 Unspecified atrial fibrillation: Secondary | ICD-10-CM | POA: Diagnosis not present

## 2023-03-08 DIAGNOSIS — K56609 Unspecified intestinal obstruction, unspecified as to partial versus complete obstruction: Secondary | ICD-10-CM | POA: Diagnosis not present

## 2023-03-08 DIAGNOSIS — N179 Acute kidney failure, unspecified: Secondary | ICD-10-CM | POA: Diagnosis not present

## 2023-03-08 DIAGNOSIS — K920 Hematemesis: Secondary | ICD-10-CM | POA: Diagnosis not present

## 2023-03-08 LAB — HEPARIN LEVEL (UNFRACTIONATED)
Heparin Unfractionated: 0.23 [IU]/mL — ABNORMAL LOW (ref 0.30–0.70)
Heparin Unfractionated: 0.35 [IU]/mL (ref 0.30–0.70)
Heparin Unfractionated: 0.37 [IU]/mL (ref 0.30–0.70)

## 2023-03-08 LAB — BASIC METABOLIC PANEL
Anion gap: 8 (ref 5–15)
BUN: 15 mg/dL (ref 8–23)
CO2: 23 mmol/L (ref 22–32)
Calcium: 7.7 mg/dL — ABNORMAL LOW (ref 8.9–10.3)
Chloride: 101 mmol/L (ref 98–111)
Creatinine, Ser: 1.1 mg/dL (ref 0.61–1.24)
GFR, Estimated: >60 mL/min (ref >60)
Glucose, Bld: 118 mg/dL — ABNORMAL HIGH (ref 70–99)
Potassium: 3.7 mmol/L (ref 3.5–5.1)
Sodium: 132 mmol/L — ABNORMAL LOW (ref 135–145)

## 2023-03-08 LAB — CBC
HCT: 23.5 % — ABNORMAL LOW (ref 39.0–52.0)
Hemoglobin: 8 g/dL — ABNORMAL LOW (ref 13.0–17.0)
MCH: 31.9 pg (ref 26.0–34.0)
MCHC: 34 g/dL (ref 30.0–36.0)
MCV: 93.6 fL (ref 80.0–100.0)
Platelets: 141 10*3/uL — ABNORMAL LOW (ref 150–400)
RBC: 2.51 MIL/uL — ABNORMAL LOW (ref 4.22–5.81)
RDW: 14 % (ref 11.5–15.5)
WBC: 12 10*3/uL — ABNORMAL HIGH (ref 4.0–10.5)
nRBC: 0 % (ref 0.0–0.2)

## 2023-03-08 LAB — APTT: aPTT: 177 s (ref 24–36)

## 2023-03-08 LAB — MAGNESIUM: Magnesium: 2.3 mg/dL (ref 1.7–2.4)

## 2023-03-08 LAB — PROTIME-INR
INR: 1.2 (ref 0.8–1.2)
Prothrombin Time: 15 s (ref 11.4–15.2)

## 2023-03-08 MED ORDER — POTASSIUM CHLORIDE CRYS ER 20 MEQ PO TBCR
60.0000 meq | EXTENDED_RELEASE_TABLET | Freq: Once | ORAL | Status: AC
  Administered 2023-03-08: 60 meq via ORAL
  Filled 2023-03-08: qty 3

## 2023-03-08 MED ORDER — WARFARIN - PHARMACIST DOSING INPATIENT
Freq: Every day | Status: DC
  Administered 2023-03-11: 1

## 2023-03-08 MED ORDER — POTASSIUM CHLORIDE CRYS ER 20 MEQ PO TBCR
40.0000 meq | EXTENDED_RELEASE_TABLET | Freq: Once | ORAL | Status: AC
  Administered 2023-03-08: 40 meq via ORAL
  Filled 2023-03-08: qty 2

## 2023-03-08 MED ORDER — FUROSEMIDE 40 MG PO TABS
40.0000 mg | ORAL_TABLET | Freq: Two times a day (BID) | ORAL | Status: DC
Start: 2023-03-09 — End: 2023-03-16
  Administered 2023-03-09 – 2023-03-16 (×14): 40 mg via ORAL
  Filled 2023-03-08 (×14): qty 1

## 2023-03-08 MED ORDER — K PHOS MONO-SOD PHOS DI & MONO 155-852-130 MG PO TABS
500.0000 mg | ORAL_TABLET | Freq: Two times a day (BID) | ORAL | Status: DC
  Administered 2023-03-08 – 2023-03-14 (×13): 500 mg via ORAL
  Filled 2023-03-08 (×15): qty 2

## 2023-03-08 MED ORDER — WARFARIN SODIUM 3 MG PO TABS
3.0000 mg | ORAL_TABLET | Freq: Once | ORAL | Status: AC
  Administered 2023-03-08: 3 mg via ORAL
  Filled 2023-03-08: qty 1

## 2023-03-08 NOTE — Evaluation (Signed)
Occupational Therapy Evaluation Patient Details Name: Alan Willis MRN: [id number] DOB: 1945-04-20 Today's Date: 03/08/2023   History of Present Illness 77 year old male who presented to the emergency department 03/03/23  with complaints of melena, hematemesis, abdominal distention, abdominal pain with workup showing SBO, AKI, GI bleed, and a fib with RVR. PMH: mitral regurgitation, mitral valve stenosis, aortic valve replacement, aflutter, CKD II, R inguinal hernia   Clinical Impression   The pt is currently presenting slightly below his baseline level of functioning for self-care management, given the below listed deficits (see OT problem list). At current, he requires SBA for tasks, including lower body dressing seated EOB, sit to stand, and grooming in standing at the sink. He reported feelings of mild generalized weakness. He will benefit from further OT services in the hospital setting, in order to maximize his independence with self care tasks and to facilitate his safe return home. Anticipate no post-acute care therapy needs.       If plan is discharge home, recommend the following: Assist for transportation;Assistance with cooking/housework;Help with stairs or ramp for entrance    Functional Status Assessment  Patient has had a recent decline in their functional status and demonstrates the ability to make significant improvements in function in a reasonable and predictable amount of time.  Equipment Recommendations  None recommended by OT    Recommendations for Other Services       Precautions / Restrictions Precautions Precautions: Fall Precaution Comments: monitor sats, HR Restrictions Weight Bearing Restrictions: No      Mobility Bed Mobility Overal bed mobility: Needs Assistance Bed Mobility: Supine to Sit, Sit to Supine     Supine to sit: Supervision Sit to supine: Supervision        Transfers Overall transfer level: Needs assistance Equipment used:  Rolling walker (2 wheels) Transfers: Sit to/from Stand Sit to Stand: Supervision                  Balance     Sitting balance-Leahy Scale: Good       Standing balance-Leahy Scale: Fair           ADL either performed or assessed with clinical judgement   ADL Overall ADL's : Needs assistance/impaired Eating/Feeding: Independent;Sitting Eating/Feeding Details (indicate cue type and reason): based on clinical judgement Grooming: Supervision/safety;Standing Grooming Details (indicate cue type and reason): He performed hand washing at sink level.         Upper Body Dressing : Set up;Sitting Upper Body Dressing Details (indicate cue type and reason): simulated seated EOB Lower Body Dressing: Set up;Supervision/safety Lower Body Dressing Details (indicate cue type and reason): He donned his slippers seated EOB. Toilet Transfer: Supervision/safety;Rolling walker (2 wheels);Ambulation;Regular Teacher, adult education Details (indicate cue type and reason): Based on clinical judgement                 Vision   Additional Comments: He correctly read the time depicted on the wall clock.            Pertinent Vitals/Pain Pain Assessment Pain Assessment: No/denies pain     Extremity/Trunk Assessment Upper Extremity Assessment Upper Extremity Assessment: Right hand dominant;Overall [facility] for tasks assessed   Lower Extremity Assessment Lower Extremity Assessment: Overall WFL for tasks assessed          Cognition Arousal: Alert Behavior During Therapy: WFL for tasks assessed/performed Overall Cognitive Status: Within Functional Limits for tasks assessed        General Comments: Oriented to person, place, and  year. Disoriented to month. Able to follow 1 step commands 100% of the time, friendly                Home Living Family/patient expects to be discharged to:: Private residence Living Arrangements: Spouse/significant other Available Help at Discharge:  Family Type of Home: House Home Access: Stairs to enter [employer] of Steps: 2 Entrance Stairs-Rails: Left;Right Home Layout: Two level;Able to live on main level with bedroom/bathroom Alternate Level Stairs-Number of Steps: they reside on the main level of the home   Bathroom Shower/Tub: Tub/shower unit;Walk-in shower         Home Equipment: Agricultural consultant (2 wheels);Shower seat          Prior Functioning/Environment Prior Level of Function : Independent/Modified Independent;Driving             Mobility Comments: He was independent with ambulation. ADLs Comments: He was independent with ADLs, doing yard work, and driving.        OT Problem List: Decreased strength;Impaired balance (sitting and/or standing);Decreased knowledge of use of DME or AE      OT Treatment/Interventions: Self-care/ADL training;Therapeutic exercise;DME and/or AE instruction;Therapeutic activities;Balance training    OT Goals(Current goals can be found in the care plan section) Acute Rehab OT Goals Patient Stated Goal: to return home soon OT Goal Formulation: With patient Time For Goal Achievement: 03/22/23 Potential to Achieve Goals: Good ADL Goals Pt Will Perform Grooming: with modified independence;standing Pt Will Perform Lower Body Dressing: with modified independence;sit to/from stand Pt Will Transfer to Toilet: with modified independence;ambulating;regular height toilet Pt Will Perform Toileting - Clothing Manipulation and hygiene: with modified independence;sit to/from stand  OT Frequency: Min 1X/week       AM-PAC OT "6 Clicks" Daily Activity     Outcome Measure Help from another person eating meals?: None Help from another person taking care of personal grooming?: None Help from another person toileting, which includes using toliet, bedpan, or urinal?: A Little Help from another person bathing (including washing, rinsing, drying)?: A Little Help from another person to  put on and taking off regular upper body clothing?: None Help from another person to put on and taking off regular lower body clothing?: A Little 6 Click Score: 21   End of Session Equipment Utilized During Treatment: Rolling walker (2 wheels) Nurse Communication: Other (comment)  Activity Tolerance: Patient tolerated treatment well Patient left: in bed;with call bell/phone within reach;with bed alarm set  OT Visit Diagnosis: Muscle weakness (generalized) (M62.81);Unsteadiness on feet (R26.81)                Time: 1545-1600 OT Time Calculation (min): 15 min Charges:  OT General Charges $OT Visit: 1 Visit OT Evaluation $OT Eval Low Complexity: 1 Low    Suella Cogar L Joelynn Dust, OTR/L 03/08/2023, 4:13 PM

## 2023-03-08 NOTE — Progress Notes (Signed)
PHARMACY - ANTICOAGULATION CONSULT NOTE  Pharmacy Consult for Heparin Indication:  mechanical AVR and Afib   Allergies  Allergen Reactions   Amoxicillin-Pot Clavulanate Rash   Codeine Other (See Comments)    Stomach upset    Patient Measurements: Height: 5\' 10"  (177.8 cm) Weight: 79.3 kg (174 lb 13.2 oz) IBW/kg (Calculated) : 73 Heparin Dosing Weight: 79.3 kg  Vital Signs: Temp: 98.7 F (37.1 C) (11/22 0009) Temp Source: Oral (11/22 0009) BP: 108/61 (11/22 0400) Pulse Rate: 56 (11/22 0400)  Labs: Recent Labs    03/06/23 0258 03/06/23 0646 03/07/23 0127 03/07/23 1140 03/07/23 2025 03/08/23 0246  HGB 8.1*  --  8.2*  --   --  8.0*  HCT 24.4*  --  24.6*  --   --  23.5*  PLT 72*  --  117*  --   --  141*  LABPROT 14.5  --  14.2  --   --  15.0  INR 1.1  --  1.1  --   --  1.2  HEPARINUNFRC  --    < > 0.29* 0.41 0.35 0.23*  CREATININE 1.16  --  1.02  --   --  1.10   < > = values in this interval not displayed.    Estimated Creatinine Clearance: 59 mL/min (by C-G formula based on SCr of 1.1 mg/dL).   Medical History: Past Medical History:  Diagnosis Date   A-fib [facility])    Atrial flutter with rapid ventricular response (HCC) 03/16/2021   Cholelithiasis 2011   Chronic anticoagulation 03/03/2023   Colon, diverticulosis    H/O heart valve replacement with mechanical valve 03/03/2023   Bjork-Shiley tilting disc valve in 1983 at [facility] in [place], [place].        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   Stricture of sigmoid colon s/p colectomy 2007 2007    Assessment:  AC/Heme: Warfarin HELD for GIB >> hep gtt 11/19 - Hx mechanical AVR and Afib on PTA warfarin 6mg  daily except 3mg  on Tuesdays.  LD on 11/16.  - INR 2.9 on admit.  Goal INR 2.5-3.5 per anticoag office notes Deboraha Sprang at [facility] 02/13/23) - Vit K given 11/18 for goal INR <2 to start heparin, per GI - Hep gtt starting 11/19 with plan to resume warfarin on  11/22 - 11/22: Heparin level 0.23 low. Hgb 8 stable. Plts 141 rising. INR 1.2  Goal of Therapy:  Heparin level 0.3-0.7 units/ml Monitor platelets by anticoagulation protocol: Yes   Plan:  Increase IV heparin to 1900 units/hr Recheck HL in 6-8 hrs. Daily HL and CBC   Makayah Pauli S. Merilynn Finland, PharmD, BCPS Clinical Staff Pharmacist [email or web address]  Merilynn Finland, [employer] 03/08/2023,4:27 AM

## 2023-03-08 NOTE — Progress Notes (Signed)
PROGRESS NOTE    Alan Willis  HYQ:[id number] DOB: 07-22-1945 DOA: 03/03/2023 PCP: [name], MD   Brief Narrative:  77 yo M with mech AV on warfarin, p/w GI bleed, SBO, AKI and Afib RVR.   On admission required pressors due to hemorrhagic shock and Afib.  Transferred to ICU and required vasopressors. EGD on 11/19 showed bleeding likely from [facility] tear. Heparin resumed. AKI resolved. Cards, General surgery and GI on board.  Transferred to [facility] 03/06/2023.  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  SBO appears to have resolved.  Tube removed 03/05/2023, total liquid diet.  Had to 3 bowel movements yesterday as well.  Requesting to advance.  Will advance to soft diet.  Hemorrhagic shock secondary to acute blood loss anemia secondary to upper GI bleed/Mallory-Weiss tear: Status post 3 units of PRBC transfusion, last on 03/05/2023 and 3 units of frozen plasma transfusion, last on 03/04/2023, status post EGD 11/19 with two large non-bleeding Mallory-Weiss tear with stigmata of recent bleeding and two large clots at site s/p clips x 3. Also two nonbleeding lineal and superficial gastric ulcers with a clean ulcer base in gastric fundus. S/p biopsy from duodenal bulb for severe mucosal changes with erythema, inflammation and nodularity.  GI recommends PPI BID x 2 months followed by daily.  After  clearance from GI, patient was started on heparin on 03/05/2023 and GI recommended to resume Coumadin in 3 days so we will resume Coumadin today while bridging heparin.  Hemoglobin has remained stable.  Monitor daily.  History of mechanical aortic valve replacement on chronic anticoagulation with Coumadin/supratherapeutic INR: Currently on heparin, resuming Coumadin  Permanent atrial fibrillation with RVR on admission: On Tikosyn PTA, was unable to take for several days prior to admission.  Resumed Tikosyn.  Goes in and out of rapid A-fib.  Has been started on low-dose of metoprolol.  Cardiology managing.  Noted plan for TEE/cardioversion Monday.  Fever: Patient has been having persistent low-grade fever since afternoon of 03/05/2023.  Patient has no symptoms suggesting of any particular infection.  Due to history of mechanical valve replacement, will check blood culture, procalcitonin and lactic acid.  Currently afebrile.  Monitor closely.  Acute hypoxic respiratory failure secondary to acute on chronic congestive heart failure with preserved ejection fraction: Much better.  Down to 5 L of oxygen.  Still with crackles.  Has had significant diuresis of 4.5 L in just 24 hours.  On Lasix 40 mg IV twice daily, cardiology managing.  I agree with this.  Hopefully he will be weaned in 1 to 2 days.  Delirium/seizures/memory issues: Continue Keppra, seizure precautions, aspiration precautions.  Currently fully alert and oriented.  I turned on all the lights in his room and advised his wife as well as primary RN at the bedside to do so during the daytime to avoid delirium. 1.  Avoid benzodiazepines, antihistamines, anticholinergics, and minimize opiate use as these may worsen delirium. 2: Assess, prevent and manage pain as lack of treatment can result in delirium.  3: Provide appropriate lighting and clear signage; a clock and calendar should be easily visible to the patient. 4: Monitor environmental factors.  Reduce light and noise at night (close shades, turn off lights, turn off TV, ect). Correct any alterations in sleep cycle. 5: Reorient the patient to person, place, time and situation on each encounter.  6: Correct sensory deficits if possible (replace eye glasses, hearing aids, ect). 7: Avoid restraints if able. Severely delirious patients benefit from constant observation by a sitter.  Hypophosphatemia: Low again, will replenish.  Acute hyponatremia: Likely hypovolemic.  Presented with 127, went as low as 125 but currently 132.  DVT prophylaxis: SCDs Start: 03/03/23 1402 heparin   Code Status: Full Code  Family Communication: Wife present at bedside.  Plan of care discussed with patient and his wife in length and he/she verbalized understanding and agreed with it.  Status is: Inpatient Remains inpatient appropriate because: Patient with multiple active issues being managed and needs to be in the hospital.   Estimated body mass index is 25.08 kg/m as calculated from the following:   Height as of this encounter: 5\' 10"  (1.778 m).   Weight as of this encounter: 79.3 kg.    Nutritional Assessment: Body mass index is 25.08 kg/m.Marland Kitchen Seen by dietician.  I agree with the assessment and plan as outlined below: Nutrition Status:        . Skin Assessment: I have examined the patient's skin and I agree with the wound assessment as performed by the wound care RN as outlined below:    Consultants:  GI, general surgery and cardiology  Procedures:  As above  Antimicrobials:  Anti-infectives (From admission, onward)    None         Subjective: Patient seen and examined.  Wife at the bedside.  Patient has no complaints.  Down to 5 L of oxygen requirement.  Had 2 bowel movements yesterday.  Objective: Vitals:   03/08/23 0600 03/08/23 0700 03/08/23 0800 03/08/23 0900  BP: 100/69 (!) 83/58  100/76  Pulse: (!) 103 (!) 103 (!) 106 (!) 53  Resp: (!) 21 20 19  (!) 23  Temp:       TempSrc:      SpO2: 98% 92% 100% 100%  Weight:      Height:        Intake/Output Summary (Last 24 hours) at 03/08/2023 0920 Last data filed at 03/08/2023 0900 Gross per 24 hour  Intake 579.57 ml  Output 5375 ml  Net -4795.43 ml   Filed Weights   03/03/23 0813 03/03/23 1320  Weight: 79.4 kg 79.3 kg    Examination:  General exam: Appears calm and comfortable  Respiratory system: Bilateral crackles. Respiratory effort normal. Cardiovascular system: S1 & S2 heard, RRR. No JVD, murmurs, rubs, gallops or clicks. No pedal edema. Gastrointestinal system: Abdomen is nondistended, soft and nontender. No organomegaly or masses felt. Normal bowel sounds heard. Central nervous system: Alert and oriented. No focal neurological deficits. Extremities: Symmetric 5 x 5 power. Skin: No rashes, lesions or ulcers.  Psychiatry: Judgement and insight appear normal. Mood & affect appropriate.   Data Reviewed: I have personally reviewed following labs and imaging studies  CBC: Recent Labs  Lab 03/03/23 0843 03/03/23 1404 03/03/23 1922 03/04/23 0012 03/05/23 0054 03/05/23 0957 03/05/23 1428 03/05/23 2244 03/06/23 0258 03/07/23 0127 03/08/23  0246  WBC 21.7*  --  33.7*   < > 11.1*  --   --  8.3 8.6 15.3* 12.0*  NEUTROABS 18.6*  --  29.8*  --   --   --   --   --   --   --   --   HGB 16.2   < > 11.6*   < > 7.4*   < > 7.5* 8.3* 8.1* 8.2* 8.0*  HCT 45.9   < > 33.6*   < > 22.5*   < > 23.1* 24.4* 24.4* 24.6* 23.5*  MCV 87.1  --  92.1   < > 94.1  --   --  93.1 95.7 94.6 93.6  PLT 181  --  155   < > 89*  --   --  73* 72* 117* 141*   < > = values in this interval not displayed.   Basic Metabolic Panel: Recent Labs  Lab 03/03/23 1404 03/03/23 1922 03/04/23 0242 03/05/23 0054 03/06/23 0258 03/07/23 0127 03/07/23 1140 03/08/23 0246  NA 126*   < > 125* 132* 133* 128*  --  132*  K 4.4   < > 4.4 4.0 3.5 3.7  --  3.7  CL 84*   < > 90* 99 103 98  --  101  CO2 28   < > 28 28 20* 21*  --  23   GLUCOSE 146*   < > 151* 92 94 128*  --  118*  BUN 50*   < > 46* 35* 19 13  --  15  CREATININE 1.76*   < > 1.55* 1.34* 1.16 1.02  --  1.10  CALCIUM 9.0   < > 8.1* 8.0* 7.9* 7.9*  --  7.7*  MG 2.0  --   --  2.3 2.1 1.9  --  2.3  PHOS 4.0  --   --   --  1.7*  --  1.6*  --    < > = values in this interval not displayed.   GFR: Estimated Creatinine Clearance: 59 mL/min (by C-G formula based on SCr of 1.1 mg/dL). Liver Function Tests: Recent Labs  Lab 03/03/23 0843 03/04/23 0242  AST 26 26  ALT 13 13  ALKPHOS 52 30*  BILITOT 2.5* 2.3*  PROT 8.0 5.6*  ALBUMIN 4.8 3.6   Recent Labs  Lab 03/03/23 1048  LIPASE <10*   No results for input(s): "AMMONIA" in the last 168 hours. Coagulation Profile: Recent Labs  Lab 03/04/23 0242 03/05/23 0054 03/06/23 0258 03/07/23 0127 03/08/23 0246  INR 2.9* 1.4* 1.1 1.1 1.2   Cardiac Enzymes: No results for input(s): "CKTOTAL", "CKMB", "CKMBINDEX", "TROPONINI" in the last 168 hours. BNP (last 3 results) No results for input(s): "PROBNP" in the last 8760 hours. HbA1C: No results for input(s): "HGBA1C" in the last 72 hours. CBG: No results for input(s): "GLUCAP" in the last 168 hours. Lipid Profile: No results for input(s): "CHOL", "HDL", "LDLCALC", "TRIG", "CHOLHDL", "LDLDIRECT" in the last 72 hours. Thyroid Function Tests: No results for input(s): "TSH", "T4TOTAL", "FREET4", "T3FREE", "THYROIDAB" in the last 72 hours. Anemia Panel: No results for input(s): "VITAMINB12", "FOLATE", "FERRITIN", "TIBC", "IRON", "RETICCTPCT" in the last 72 hours. Sepsis Labs: Recent Labs  Lab 03/03/23 1922 03/04/23 0012 03/04/23 0242 03/06/23 1052  PROCALCITON  --   --   --  0.20  LATICACIDVEN 2.2* 2.3* 2.2* 1.0    Recent Results (from the past 240 hour(s))  MRSA Next Gen by PCR, Nasal  Status: None   Collection Time: 03/04/23  2:30 AM   Specimen: Nasal Mucosa; Nasal Swab  Result Value Ref Range Status   MRSA by PCR Next Gen NOT DETECTED NOT  DETECTED Final    Comment: (NOTE) The GeneXpert MRSA Assay (FDA approved for NASAL specimens only), is one component of a comprehensive MRSA colonization surveillance program. It is not intended to diagnose MRSA infection nor to guide or monitor treatment for MRSA infections. Test performance is not FDA approved in patients less than 83 years old. Performed at [facility], [address], [place], [place] [address]   Culture, blood (Routine X 2) w Reflex to ID Panel     Status: None (Preliminary result)   Collection Time: 03/06/23 10:52 AM   Specimen: BLOOD LEFT ARM  Result Value Ref Range Status   Specimen Description   Final    BLOOD LEFT ARM Performed at [facility], [address] [place], [place] [address]    Special Requests   Final    BOTTLES DRAWN AEROBIC AND ANAEROBIC Blood Culture adequate volume Performed at [facility], [address], [place], [place] [address]    Culture   Final    NO GROWTH 2 DAYS Performed at [facility], [address], [place], [place] [address]    Report Status PENDING  Incomplete  Culture, blood (Routine X 2) w Reflex to ID Panel     Status: None (Preliminary result)   Collection Time: 03/06/23 11:08 AM   Specimen: BLOOD  Result Value Ref Range Status   Specimen Description   Final    BLOOD SITE NOT SPECIFIED Performed at [facility], [address], [place], [place] [address]    Special Requests   Final    BOTTLES DRAWN AEROBIC ONLY Blood Culture results may not be optimal due to an inadequate volume of blood received in culture bottles Performed at [facility], [address], [place], [place] [address]    Culture   Final    NO GROWTH 2 DAYS Performed at [facility], [address], [place], [place] [address]    Report Status PENDING  Incomplete     Radiology Studies: ECHOCARDIOGRAM COMPLETE  Result Date: 03/07/2023     ECHOCARDIOGRAM REPORT   Patient Name:   Alan Willis [facility] Date of Exam: 03/07/2023 Medical Rec #:  [id number]          Height:       70.0 in Accession #:    [id number]         Weight:       174.8 lb Date of Birth:  1945/10/07          BSA:          1.971 m Patient Age:    77 years           BP:           114/58 mmHg Patient Gender: M                  HR:           92 bpm. Exam Location:  Inpatient Procedure: 2D Echo, Cardiac Doppler and Color Doppler Indications:    CHF- Acute Diastolic I50.31  History:        Patient has no prior history of Echocardiogram examinations.  CKD, Stage 2; Arrythmias:Atrial Fibrillation. Bjork-Shiley                 tilting disc valve in 1983.                 Aortic Valve: Bjork-Shiley ball and cage valve is present in the                 aortic position.  Sonographer:    Lucendia Herrlich RCS Referring Phys: [id number] Luay Balding IMPRESSIONS  1. Left ventricular ejection fraction, by estimation, is 40 to 45%. The left ventricle has mildly decreased function. The left ventricle demonstrates global hypokinesis. There is moderate left ventricular hypertrophy of the basal-septal segment. Left ventricular diastolic function could not be evaluated.  2. Right ventricular systolic function is moderately reduced. The right ventricular size is mildly enlarged. There is moderately elevated pulmonary artery systolic pressure. The estimated right ventricular systolic pressure is 55.6 mmHg.  3. Left atrial size was severely dilated.  4. Right atrial size was severely dilated.  5. The mitral valve is degenerative. Mild to moderate mitral valve regurgitation. 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 The mean mitral valve gradient is 8.2 mmHg. Moderate to severe mitral annular calcification.  6. The aortic valve has been repaired/replaced. Aortic valve regurgitation is not visualized. There is a Bjork-Shiley ball and cage valve present in the aortic position. Aortic valve mean gradient measures  9.0 mmHg. Aortic valve Vmax measures 2.00 m/s.  7. Aortic dilatation noted. There is mild dilatation of the ascending aorta, measuring 39 mm.  8. The inferior vena cava is dilated in size with >50% respiratory variability, suggesting right atrial pressure of 8 mmHg. FINDINGS  Left Ventricle: Left ventricular ejection fraction, by estimation, is 40 to 45%. The left ventricle has mildly decreased function. The left ventricle demonstrates global hypokinesis. The left ventricular internal cavity size was normal in size. There is  moderate left ventricular hypertrophy of the basal-septal segment. Left ventricular diastolic function could not be evaluated due to atrial fibrillation. Left ventricular diastolic function could not be evaluated. Right Ventricle: The right ventricular size is mildly enlarged. No increase in right ventricular wall thickness. Right ventricular systolic function is moderately reduced. There is moderately elevated pulmonary artery systolic pressure. The tricuspid regurgitant velocity is 3.45 m/s, and with an assumed right atrial pressure of 8 mmHg, the estimated right ventricular systolic pressure is 55.6 mmHg. Left Atrium: Left atrial size was severely dilated. Right Atrium: Right atrial size was severely dilated. Pericardium: There is no evidence of pericardial effusion. Mitral Valve: The mitral valve is degenerative in appearance. There is moderate thickening of the mitral valve leaflet(s). There is moderate calcification of the mitral valve leaflet(s). Moderate to severe mitral annular calcification. Mild to moderate mitral valve regurgitation. 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 MV peak gradient, 16.9 mmHg. The mean mitral valve gradient is 8.2 mmHg. Tricuspid Valve: The tricuspid valve is normal in structure. Tricuspid valve regurgitation is mild . No evidence of tricuspid stenosis. Aortic Valve: The aortic valve has been repaired/replaced. Aortic valve regurgitation is not visualized.  Aortic valve mean gradient measures 9.0 mmHg. Aortic valve peak gradient measures 16.0 mmHg. Aortic valve area, by VTI measures 1.42 cm. There is a Bjork-Shiley ball and cage valve present in the aortic position. Pulmonic Valve: The pulmonic valve was normal in structure. Pulmonic valve regurgitation is trivial. No evidence of pulmonic stenosis. Aorta: Aortic dilatation noted. There is mild dilatation of the ascending aorta, measuring 39 mm. Venous:  The inferior vena cava is dilated in size with greater than 50% respiratory variability, suggesting right atrial pressure of 8 mmHg. IAS/Shunts: No atrial level shunt detected by color flow Doppler.  LEFT VENTRICLE PLAX 2D LVIDd:         5.60 cm      Diastology LVIDs:         4.30 cm      LV e' medial:    6.57 cm/s LV PW:         1.10 cm      LV E/e' medial:  28.9 LV IVS:        1.30 cm      LV e' lateral:   9.57 cm/s LVOT diam:     2.00 cm      LV E/e' lateral: 19.9 LV SV:         38 LV SV Index:   19 LVOT Area:     3.14 cm  LV Volumes (MOD) LV vol d, MOD A2C: 122.0 ml LV vol d, MOD A4C: 138.0 ml LV vol s, MOD A2C: 72.4 ml LV vol s, MOD A4C: 86.6 ml LV SV MOD A2C:     49.6 ml LV SV MOD A4C:     138.0 ml LV SV MOD BP:      48.4 ml RIGHT VENTRICLE            IVC RV S prime:     7.09 cm/s  IVC diam: 2.10 cm TAPSE (M-mode): 1.3 cm LEFT ATRIUM              Index        RIGHT ATRIUM           Index LA diam:        5.20 cm  2.64 cm/m   RA Area:     27.20 cm LA Vol (A2C):   120.0 ml 60.87 ml/m  RA Volume:   84.60 ml  42.92 ml/m LA Vol (A4C):   106.0 ml 53.77 ml/m LA Biplane Vol: 121.0 ml 61.38 ml/m  AORTIC VALVE AV Area (Vmax):    1.23 cm AV Area (Vmean):   1.19 cm AV Area (VTI):     1.42 cm AV Vmax:           200.00 cm/s AV Vmean:          135.333 cm/s AV VTI:            0.265 m AV Peak Grad:      16.0 mmHg AV Mean Grad:      9.0 mmHg LVOT Vmax:         78.10 cm/s LVOT Vmean:        51.267 cm/s LVOT VTI:          0.120 m LVOT/AV VTI ratio: 0.45  AORTA Ao Root diam:  3.50 cm Ao Asc diam:  3.90 cm MITRAL VALVE                  TRICUSPID VALVE MV Area (PHT): 3.74 cm       TR Peak grad:   47.6 mmHg MV Area VTI:   1.08 cm       TR Vmax:        345.00 cm/s MV Peak grad:  16.9 mmHg MV Mean grad:  8.2 mmHg       SHUNTS MV Vmax:       2.05 m/s       Systemic VTI:  0.12 m  MV Vmean:      136.5 cm/s     Systemic Diam: 2.00 cm MV Decel Time: 203 msec MR Peak grad:    92.5 mmHg MR Mean grad:    74.0 mmHg MR Vmax:         481.00 cm/s MR Vmean:        408.0 cm/s MR PISA:         1.01 cm MR PISA Eff ROA: 8 mm MR PISA Radius:  0.40 cm MV E velocity: 190.00 cm/s MV A velocity: 104.00 cm/s MV E/A ratio:  1.83 Armanda Magic MD Electronically signed by Armanda Magic MD Signature Date/Time: 03/07/2023/3:16:08 PM    Final    DG CHEST PORT 1 VIEW  Result Date: 03/07/2023 CLINICAL DATA:  77 year old male with hypoxia. EXAM: PORTABLE CHEST 1 VIEW COMPARISON:  Portable chest 03/03/2023 and earlier. FINDINGS: Portable AP semi upright view at 0851 hours. Naso enteric tube removed. Stable lung volumes and mediastinal contours but new patchy and indistinct perihilar and interstitial opacity. Evidence of a small left pleural effusion. No pneumothorax. No air bronchograms. No right pleural effusion. Negative visible bowel gas. IMPRESSION: 1. Nasoenteric tube removed. 2. New perihilar and interstitial opacity with evidence of a small left pleural effusion. Favor acute pulmonary edema. Atypical/viral respiratory infection felt less likely. Electronically Signed   By: Odessa Fleming M.D.   On: 03/07/2023 09:42    Scheduled Meds:  sodium chloride   Intravenous Once   Chlorhexidine Gluconate Cloth  6 each Topical Daily   dofetilide  250 mcg Oral BID   feeding supplement  1 Container Oral TID BM   furosemide  40 mg Intravenous BID   lidocaine  1 Application Topical Once   metoprolol tartrate  25 mg Oral BID   pantoprazole (PROTONIX) IV  40 mg Intravenous Q12H   potassium chloride  40 mEq Oral Once    Continuous Infusions:  heparin 1,900 Units/hr (03/08/23 0508)   levETIRAcetam Stopped (03/07/23 1047)   And   levETIRAcetam Stopped (03/08/23 0126)     LOS: 5 days   Hughie Closs, MD [facility]  03/08/2023, 9:20 AM   *Please note that this is a verbal dictation therefore any spelling or grammatical errors are due to the "Dragon [employer] One" system interpretation.  Please page via Amion and do not message via secure chat for urgent patient care matters. Secure chat can be used for non urgent patient care matters.  How to contact the [facility] Attending or Consulting provider 7A - 7P or covering provider during after hours 7P -7A, for this patient?  Check the care team in [facility] and look for a) attending/consulting [facility] provider listed and b) the [facility] team listed. Page or secure chat 7A-7P. Log into [email or web address] and use [place]'s universal password to access. If you do not have the password, please contact the hospital operator. Locate the [facility] provider you are looking for under [facility] and page to a number that you can be directly reached. If you still have difficulty reaching the provider, please page the [facility] (Director on Call) for the Hospitalists listed on amion for assistance.

## 2023-03-08 NOTE — Progress Notes (Addendum)
PHARMACY - ANTICOAGULATION CONSULT NOTE  Pharmacy Consult for heparin >> warfarin bridge  Indication:  atrial fibrillation, mechanical AVR  Allergies  Allergen Reactions   Amoxicillin-Pot Clavulanate Rash   Codeine Other (See Comments)    Stomach upset    Patient Measurements: Height: 5\' 10"  (177.8 cm) Weight: 79.3 kg (174 lb 13.2 oz) IBW/kg (Calculated) : 73 Heparin Dosing Weight: TBW  Vital Signs: Temp: 97.8 F (36.6 C) (11/22 1200) Temp Source: Oral (11/22 1200) BP: 92/72 (11/22 1100) Pulse Rate: 91 (11/22 1125)  Labs: Recent Labs    03/06/23 0258 03/06/23 0646 03/07/23 0127 03/07/23 1140 03/07/23 2025 03/08/23 0246 03/08/23 1050  HGB 8.1*  --  8.2*  --   --  8.0*  --   HCT 24.4*  --  24.6*  --   --  23.5*  --   PLT 72*  --  117*  --   --  141*  --   APTT  --   --   --   --   --   --  177*  LABPROT 14.5  --  14.2  --   --  15.0  --   INR 1.1  --  1.1  --   --  1.2  --   HEPARINUNFRC  --    < > 0.29* 0.41 0.35 0.23*  --   CREATININE 1.16  --  1.02  --   --  1.10  --    < > = values in this interval not displayed.    Estimated Creatinine Clearance: 59 mL/min (by C-G formula based on SCr of 1.1 mg/dL).   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   Stricture of sigmoid colon s/p colectomy 2007 2007    Medications: Infusions:   heparin 1,900 Units/hr (03/08/23 1116)   levETIRAcetam Stopped (03/08/23 4098)   And   levETIRAcetam Stopped (03/08/23 0126)     Assessment: 77 YO male presenting with a SBO and hematemesis. Patient has a significant cardiac  history of atrial fibrillation (s/p TEE guided DCCV 07/2019) and AVR  [facility] 1983) on chronic warfarin anticoagulation. Last dose of warfarin 11/16 @0800 . INR was 2.9 on admission and increased up to 4.1 on 11/17 -  he was given IV vitamin K 10mg  x1 on 11/18 to get INR <2 for EGD 11/19 which showed 2 large Mallory-Weiss tear with clots--3 endoclips have been placed to close mucosal defect, superficial fundic ulcers. Pharmacy consulted for heparin dosing on 11/19 with plan to restart warfarin in 3 days per GI on 11/22. Will give first dose of warfarin today.  PTA warfarin regimen: 6mg  daily except 3mg  on Tuesdays.  (goal INR 2.5-3.5 per outpatient notes)  Today, 03/08/23, PM:  aPTT supratherapeutic at 177 (goal 66-102 sec) Confirmed with phlebotomy that aPTT was drawn from the patient's right arm and heparin infusing in left arm. Patient has had mostly subtherapeutic heparin levels during this admission and appears stable upon my examination with no s/sx of bleeding at this time.  Hgb 8, plts 141, low but stable  INR subtherapeutic at  1.2 Given recent bleed, will conservatively dose warfarin today. Per discussion with cardiology, targeting a new INR goal of 2-3 given recent bleed.   Goal of Therapy:  INR 2-3 Heparin level 0.3-0.7 units/ml Monitor platelets by anticoagulation protocol: Yes   Plan:  Hold heparin x1hr  F/u repeat stat heparin level and adjust PRN Give warfarin 3mg  x1 tonight  Daily heparin level, INR, CBC Monitor for s/sx of bleeding  Plan for TEE/DCCV on Monday, per cardiology  Addendum: Repeat STAT heparin level is therapeutic at 0.35. Will restart heparin drip at previous rate of 1900 units/hr and check a confirmatory heparin level in 8hrs. Will continue heparin gtt until INR is within goal range on warfarin.    Cherylin Mylar, PharmD Clinical Pharmacist  11/22/202412:20 PM

## 2023-03-08 NOTE — Progress Notes (Signed)
Physical Therapy Treatment Patient Details Name: Alan Willis MRN: [id number] DOB: [birth year] Today's Date: 03/08/2023   History of Present Illness 77 year old male with a past medical history atrial fibrillation, history of ablation, mitral regurgitation, mitral valve stenosis, aortic valve replacement on warfarin who presented to the emergency department 03/03/23  with complaints of melena, hematemesis, abdominal distention, abdominal pain with workup showing SBO, AKI and positive occult blood in stool,Hemorrhagic shock secondary to acute blood loss anemia secondary to upper GI bleed, new small left pleural effusion, requiring increased Oxygen.    PT Comments  Pt was seen in bed with son and wife present in room. Pt was on 5L O2 at 100% O2. Nasal canula was removed during session. Pt was eager to get up and walk. Pt performed supine to sit with supervision. Pt performed sit to stand with RW, requring CGA. Pt ambulated 100 ft with RW requiring CGA. Pt experienced no dyspnea, highest HR was 122 and lowest O2 was 93% RA. Pt was reposition in recliner at end of session. LPT recommended HHPT  Vitals Supine: HR 102 O2 100 5L RR 25 BP 87/56  Seated (Nasal canula removed): BP 94/74  Standing: BP 90/59  End of session: HR 110 O2 97 RA RR 20 BP 87/62    If plan is discharge home, recommend the following: A little help with walking and/or transfers;A little help with bathing/dressing/bathroom;Assistance with cooking/housework;Assist for transportation;Help with stairs or ramp for entrance   Can travel by private vehicle        Equipment Recommendations    Agricultural consultant   Recommendations for Other Services       Precautions / Restrictions Precautions Precautions: Fall Precaution Comments: monitor sats, HR Restrictions Weight Bearing Restrictions: No     Mobility  Bed Mobility Overal bed mobility: Modified Independent Bed Mobility: Supine to Sit     Supine to sit:  Supervision     General bed mobility comments: Performed bed mobility without any assistance. Patient Response: Cooperative  Transfers Overall transfer level: Needs assistance Equipment used: Rolling walker (2 wheels) Transfers: Sit to/from Stand Sit to Stand: Contact guard assist           General transfer comment: Pt required increased time, heavy use of UEs on RW    Ambulation/Gait Ambulation/Gait assistance: CGA Gait Distance (Feet): 100 Feet Assistive device: Rolling walker (2 wheels) Gait Pattern/deviations: Step-through pattern, Narrow base of support Gait velocity: decr     General Gait Details: Alternating, narrow base of support,   Stairs             Wheelchair Mobility     Tilt Bed Tilt Bed Patient Response: Cooperative  Modified Rankin (Stroke Patients Only)       Balance                                            Cognition Arousal: Alert Behavior During Therapy: WFL for tasks assessed/performed Overall Cognitive Status: Impaired/Different from baseline Area of Impairment: Safety/judgement                 Orientation Level: Place, Time, Situation, Person             General Comments: A&O x 4        Exercises      General Comments        Pertinent Vitals/Pain Pain Assessment Pain  Assessment: No/denies pain    Home Living                          Prior Function            PT Goals (current goals can now be found in the care plan section) Acute Rehab PT Goals Patient Stated Goal: go home, walk the dogs PT Goal Formulation: With patient/family Time For Goal Achievement: 03/21/23 Progress towards PT goals: Progressing toward goals    Frequency    Min 1X/week      PT Plan      Co-evaluation              AM-PAC PT "6 Clicks" Mobility   Outcome Measure  Help needed turning from your back to your side while in a flat bed without using bedrails?: None Help needed  moving from lying on your back to sitting on the side of a flat bed without using bedrails?: None Help needed moving to and from a bed to a chair (including a wheelchair)?: A Little Help needed standing up from a chair using your arms (e.g., wheelchair or bedside chair)?: A Little Help needed to walk in hospital room?: A Little Help needed climbing 3-5 steps with a railing? : A Lot 6 Click Score: 19    End of Session Equipment Utilized During Treatment: Gait belt;Oxygen Activity Tolerance: Patient tolerated treatment well Patient left: in chair;with call bell/phone within reach;with family/visitor present Nurse Communication: Mobility status PT Visit Diagnosis: Unsteadiness on feet (R26.81);Difficulty in walking, not elsewhere classified (R26.2)     Time: 9833-8250 PT Time Calculation (min) (ACUTE ONLY): 28 min  Charges:    $Gait Training: 8-22 mins $Therapeutic Activity: 8-22 mins PT General Charges $$ ACUTE PT VISIT: 1 Visit                    Lazaro Arms, SPTA 03/08/2023, 2:29 PM

## 2023-03-08 NOTE — Progress Notes (Signed)
Patient Name: Alan Willis Date of Encounter: 03/08/2023 [facility] Cardiologist: None   Interval Summary  .    Feeling well.  Wants to go home denies chest pain or shortness of breath.  Denies palpitations.  Vital Signs .    Vitals:   03/08/23 0700 03/08/23 0800 03/08/23 0900 03/08/23 0937  BP: (!) 83/58  100/76   Pulse: (!) 103 (!) 106 (!) 53 (!) 113  Resp: 20 19 (!) 23   Temp:      TempSrc:      SpO2: 92% 100% 100%   Weight:      Height:        Intake/Output Summary (Last 24 hours) at 03/08/2023 0953 Last data filed at 03/08/2023 0900 Gross per 24 hour  Intake 579.57 ml  Output 5375 ml  Net -4795.43 ml      03/03/2023    1:20 PM 03/03/2023    8:13 AM 12/19/2022    7:55 AM  Last 3 Weights  Weight (lbs) 174 lb 13.2 oz 175 lb 176 lb 12.8 oz  Weight (kg) 79.3 kg 79.379 kg 80.196 kg      Telemetry/ECG    Atrial fibrillation.  Rate 70s to 110s.- Personally Reviewed  Physical Exam .   \VS:  BP 100/76 (BP Location: Left Arm)   Pulse (!) 113   Temp 98.7 F (37.1 C) (Oral)   Resp (!) 23   Ht 5\' 10"  (1.778 m)   Wt 79.3 kg   SpO2 100%   BMI 25.08 kg/m  , BMI Body mass index is 25.08 kg/m. GENERAL:  Well appearing.  No acute distress. HEENT: Pupils equal round and reactive, fundi not visualized, oral mucosa unremarkable NECK:  No jugular venous distention, waveform within normal limits, carotid upstroke brisk and symmetric, no bruits, no thyromegaly LUNGS:  Clear to auscultation bilaterally HEART: Tachycardic.  Irregularly irregular PMI not displaced or sustained,S1 and S2 within normal limits, no S3, no S4, no clicks, no rubs, no murmurs ABD:  Flat, positive bowel sounds normal in frequency in pitch, no bruits, no rebound, no guarding, no midline pulsatile mass, no hepatomegaly, no splenomegaly EXT:  2 plus pulses throughout, no edema, no cyanosis no clubbing SKIN:  No rashes no nodules NEURO:  Cranial nerves II through XII grossly intact,  motor grossly intact throughout PSYCH:  Cognitively intact, oriented to person place and time   Assessment & Plan .     Alan Willis is a 45M with atrial fibrillation/flutter status post DCCV on dofetilide, s/p mechanical AVR on coumadin, CKD 2 admitted with GI bleed.   # Atrial fibrillation/flutter: Alan Willis has been intermittently in and out of atrial fibrillation after his upper endoscopy.  He is now mostly in atrial fibrillation.  Rates are better controlled when on metoprolol.  Hypotension prohibits further titration of the metoprolol.  He has been fully loaded with dofetilide.  Will plan for TEE/DCCV on Monday.  Per GI, okay to resume warfarin in 3 days, which is today.  Titrate metoprolol as blood pressure allows.  Informed Consent   Shared Decision Making/Informed Consent   The risks [stroke, cardiac arrhythmias rarely resulting in the need for a temporary or permanent pacemaker, skin irritation or burns, esophageal damage, perforation (1:10,000 risk), bleeding, pharyngeal hematoma as well as other potential complications associated with conscious sedation including aspiration, arrhythmia, respiratory failure and death], benefits (treatment guidance, restoration of normal sinus rhythm, diagnostic support) and alternatives of a transesophageal echocardiogram guided cardioversion were  discussed in detail with Alan Willis and he is willing to proceed.    # Hemorrhagic shock: Found to have Mallory-Weiss tear on EGD yesterday.  This was treated with hemostatic clips.  He also had nonbleeding gastric ulcers.  Indefinite PPI.  GI recommended resuming warfarin today.  Follow H/H.   # Hypertension:  Home spironolactone on hold.  Added metoprolol for rate control as above.   # Mechanical aortic valve replacement: # Moderate-severe MR: # HFpEF:  Aortic valve mean gradient was 9 mmHg on echo this admission.  Moderate to severe mitral stenosis with mild to moderate moderate mitral  regurgitation.  Will need to be managed outpatient.  Yesterday he was net -3.7 L and renal function is stable.  He received a dose of IV Lasix already this morning.  Will transition to 40 mg p.o. twice daily tomorrow.  His home dose is 40 mg p.o. daily.  Check BNP in the morning.   For questions or updates, please contact [facility] Please consult [email or web address] for contact info under        Signed, Chilton Si, MD

## 2023-03-08 NOTE — Progress Notes (Signed)
   Reviewed morning EKG which was done 2 hours after final loading dose of Tikosyn. EKG showed atrial flutter, rate 92 bpm, with non-specific T wave changes. QTc was automatically read as 573 ms. Reviewed EKG with Dr. Jacques Navy (DOD) and EP PA and it looks like computer is miscalculating Qtc due to flutter waves. We got 495 ms on our calculation when using the Bazett formula. Per Dr. Duke Salvia, plan is to continue Tikosyn and plan for TEE/ DCCV on Monday. He is currently on IV Heparin, and Coumadin was restarted today. Patient was started on IV Heparin on 03/05/2023 after EGD. This was the first day INR was below 2.0. Per chart review, it looks like he went back into atrial fibrillation later that evening on 03/05/2023 and was already on Heparin at that time. Therefore, risk of stroke due to chemical cardioversion felt to be low. TEE on Monday will also allow [place] to assess valvular disease.   Corrin Parker, PA-C 03/08/2023 4:24 PM

## 2023-03-08 NOTE — Progress Notes (Signed)
PHARMACY - ANTICOAGULATION CONSULT NOTE  Pharmacy Consult for heparin >> warfarin bridge  Indication:  atrial fibrillation, mechanical AVR  Allergies  Allergen Reactions   Amoxicillin-Pot Clavulanate Rash   Codeine Other (See Comments)    Stomach upset    Patient Measurements: Height: 5\' 10"  (177.8 cm) Weight: 79.3 kg (174 lb 13.2 oz) IBW/kg (Calculated) : 73 Heparin Dosing Weight: TBW  Vital Signs: Temp: 98.2 F (36.8 C) (11/22 2000) Temp Source: Oral (11/22 2000) BP: 83/63 (11/22 2000) Pulse Rate: 38 (11/22 2100)  Labs: Recent Labs    03/06/23 0258 03/06/23 0646 03/07/23 0127 03/07/23 1140 03/08/23 0246 03/08/23 1050 03/08/23 1227 03/08/23 2058  HGB 8.1*  --  8.2*  --  8.0*  --   --   --   HCT 24.4*  --  24.6*  --  23.5*  --   --   --   PLT 72*  --  117*  --  141*  --   --   --   APTT  --   --   --   --   --  177*  --   --   LABPROT 14.5  --  14.2  --  15.0  --   --   --   INR 1.1  --  1.1  --  1.2  --   --   --   HEPARINUNFRC  --    < > 0.29*   < > 0.23*  --  0.35 0.37  CREATININE 1.16  --  1.02  --  1.10  --   --   --    < > = values in this interval not displayed.    Estimated Creatinine Clearance: 59 mL/min (by C-G formula based on SCr of 1.1 mg/dL).   Medical History: Past Medical History:  Diagnosis Date   A-fib [facility])    Atrial flutter with rapid ventricular response (HCC) 03/16/2021   Cholelithiasis 2011   Chronic anticoagulation 03/03/2023   Colon, diverticulosis    H/O heart valve replacement with mechanical valve 03/03/2023   Bjork-Shiley tilting disc valve in 1983 at [facility] in [place], [place].        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   Stricture of sigmoid colon s/p colectomy 2007 2007    Medications: Infusions:   heparin 1,900 Units/hr (03/08/23 1521)   levETIRAcetam Stopped (03/08/23 7253)   And   levETIRAcetam Stopped (03/08/23 0126)     Assessment: 77 YO male presenting with a SBO  and hematemesis. Patient has a significant cardiac history of atrial fibrillation (s/p TEE guided DCCV 07/2019) and AVR  [facility] 1983) on chronic warfarin anticoagulation. Last dose of warfarin 11/16 @0800 . INR was 2.9 on admission and increased up to 4.1 on 11/17 -  he was given IV vitamin K 10mg  x1 on 11/18 to get INR <2 for EGD 11/19 which showed 2 large Mallory-Weiss tear with clots--3 endoclips have been placed to close mucosal defect, superficial fundic ulcers. Pharmacy consulted for heparin dosing on 11/19 with plan to restart warfarin in 3 days per GI on 11/22. Will give first dose of warfarin today.  PTA warfarin regimen: 6mg  daily except 3mg  on Tuesdays.  (goal INR 2.5-3.5 per outpatient notes)  Today, 03/08/23, PM:  HL is 0.37, therapeutic Hgb 8, plts 141, low but stable  INR subtherapeutic at 1.2 Given recent bleed, will conservatively dose warfarin today. Per discussion with cardiology, targeting a new INR goal of  2-3 given recent bleed.   Goal of Therapy:  INR 2-3 Heparin level 0.3-0.7 units/ml Monitor platelets by anticoagulation protocol: Yes   Plan:  Continue heparin at 1900 units/hr  Give warfarin 3mg  x1 tonight  Daily heparin level, INR, CBC Monitor for s/sx of bleeding  Plan for TEE/DCCV on Monday, per cardiology Will continue heparin gtt until INR is within goal range on warfarin.    Adalberto Cole, PharmD, BCPS 03/08/2023 9:32 PM

## 2023-03-09 DIAGNOSIS — I480 Paroxysmal atrial fibrillation: Secondary | ICD-10-CM

## 2023-03-09 DIAGNOSIS — K920 Hematemesis: Secondary | ICD-10-CM | POA: Diagnosis not present

## 2023-03-09 LAB — PHOSPHORUS: Phosphorus: 2.6 mg/dL (ref 2.5–4.6)

## 2023-03-09 LAB — CBC
HCT: 26.8 % — ABNORMAL LOW (ref 39.0–52.0)
Hemoglobin: 8.8 g/dL — ABNORMAL LOW (ref 13.0–17.0)
MCH: 31.5 pg (ref 26.0–34.0)
MCHC: 32.8 g/dL (ref 30.0–36.0)
MCV: 96.1 fL (ref 80.0–100.0)
Platelets: 150 10*3/uL (ref 150–400)
RBC: 2.79 MIL/uL — ABNORMAL LOW (ref 4.22–5.81)
RDW: 13.9 % (ref 11.5–15.5)
WBC: 12.3 10*3/uL — ABNORMAL HIGH (ref 4.0–10.5)
nRBC: 0 % (ref 0.0–0.2)

## 2023-03-09 LAB — BASIC METABOLIC PANEL
Anion gap: 10 (ref 5–15)
BUN: 17 mg/dL (ref 8–23)
CO2: 23 mmol/L (ref 22–32)
Calcium: 7.8 mg/dL — ABNORMAL LOW (ref 8.9–10.3)
Chloride: 101 mmol/L (ref 98–111)
Creatinine, Ser: 1.14 mg/dL (ref 0.61–1.24)
GFR, Estimated: >60 mL/min (ref >60)
Glucose, Bld: 107 mg/dL — ABNORMAL HIGH (ref 70–99)
Potassium: 3.7 mmol/L (ref 3.5–5.1)
Sodium: 134 mmol/L — ABNORMAL LOW (ref 135–145)

## 2023-03-09 LAB — BRAIN NATRIURETIC PEPTIDE: B Natriuretic Peptide: 471.4 pg/mL — ABNORMAL HIGH (ref 0.0–100.0)

## 2023-03-09 LAB — PROTIME-INR
INR: 1.1 (ref 0.8–1.2)
Prothrombin Time: 14.6 s (ref 11.4–15.2)

## 2023-03-09 LAB — HEPARIN LEVEL (UNFRACTIONATED): Heparin Unfractionated: 0.5 [IU]/mL (ref 0.30–0.70)

## 2023-03-09 LAB — MAGNESIUM: Magnesium: 2.1 mg/dL (ref 1.7–2.4)

## 2023-03-09 MED ORDER — WARFARIN SODIUM 4 MG PO TABS
4.5000 mg | ORAL_TABLET | Freq: Once | ORAL | Status: AC
  Administered 2023-03-09: 4.5 mg via ORAL
  Filled 2023-03-09: qty 1

## 2023-03-09 MED ORDER — POTASSIUM CHLORIDE 10 MEQ/100ML IV SOLN
10.0000 meq | INTRAVENOUS | Status: AC
  Administered 2023-03-09 (×4): 10 meq via INTRAVENOUS
  Filled 2023-03-09 (×4): qty 100

## 2023-03-09 NOTE — Plan of Care (Signed)
  Problem: Education: Goal: Knowledge of General Education information will improve Description: Including pain rating scale, medication(s)/side effects and non-pharmacologic comfort measures Outcome: Progressing   Problem: Health Behavior/Discharge Planning: Goal: Ability to manage health-related needs will improve Outcome: Progressing   Problem: Clinical Measurements: Goal: Ability to maintain clinical measurements within normal limits will improve Outcome: Progressing Goal: Diagnostic test results will improve Outcome: Progressing Goal: Respiratory complications will improve Outcome: Progressing Goal: Cardiovascular complication will be avoided Outcome: Progressing   Problem: Nutrition: Goal: Adequate nutrition will be maintained Outcome: Progressing   Problem: Elimination: Goal: Will not experience complications related to bowel motility Outcome: Progressing Goal: Will not experience complications related to urinary retention Outcome: Progressing   Problem: Pain Management: Goal: General experience of comfort will improve Outcome: Progressing

## 2023-03-09 NOTE — Progress Notes (Addendum)
PHARMACY - ANTICOAGULATION CONSULT NOTE  Pharmacy Consult for heparin >> warfarin bridge  Indication:  atrial fibrillation, mechanical AVR  Allergies  Allergen Reactions   Amoxicillin-Pot Clavulanate Rash   Codeine Other (See Comments)    Stomach upset    Patient Measurements: Height: 5\' 10"  (177.8 cm) Weight: 79.3 kg (174 lb 13.2 oz) IBW/kg (Calculated) : 73 Heparin Dosing Weight: TBW  Vital Signs: Temp: 97.7 F (36.5 C) (11/23 0404) Temp Source: Oral (11/23 0404) BP: 101/64 (11/23 0600) Pulse Rate: 101 (11/23 0600)  Labs: Recent Labs    03/07/23 0127 03/07/23 1140 03/08/23 0246 03/08/23 1050 03/08/23 1227 03/08/23 2058 03/09/23 0310  HGB 8.2*  --  8.0*  --   --   --  8.8*  HCT 24.6*  --  23.5*  --   --   --  26.8*  PLT 117*  --  141*  --   --   --  150  APTT  --   --   --  177*  --   --   --   LABPROT 14.2  --  15.0  --   --   --  14.6  INR 1.1  --  1.2  --   --   --  1.1  HEPARINUNFRC 0.29*   < > 0.23*  --  0.35 0.37 0.50  CREATININE 1.02  --  1.10  --   --   --  1.14   < > = values in this interval not displayed.    Estimated Creatinine Clearance: 56.9 mL/min (by C-G formula based on SCr of 1.14 mg/dL).   Medical History: Past Medical History:  Diagnosis Date   A-fib [facility])    Atrial flutter with rapid ventricular response (HCC) 03/16/2021   Cholelithiasis 2011   Chronic anticoagulation 03/03/2023   Colon, diverticulosis    H/O heart valve replacement with mechanical valve 03/03/2023   Bjork-Shiley tilting disc valve in 1983 at [facility] in [place], [place].        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   Stricture of sigmoid colon s/p colectomy 2007 2007    Medications: Infusions:   heparin 1,900 Units/hr (03/09/23 0324)   levETIRAcetam Stopped (03/08/23 6213)   And   levETIRAcetam Stopped (03/08/23 2258)     Assessment: 77 YO male presenting with a SBO and hematemesis. Patient has a significant cardiac  history of atrial fibrillation (s/p TEE guided DCCV 07/2019) and AVR  [facility] 1983) on chronic warfarin anticoagulation. Last dose of warfarin 11/16 @0800 . INR was 2.9 on admission and increased up to 4.1 on 11/17 -  he was given IV vitamin K 10mg  x1 on 11/18 to get INR <2 for EGD 11/19 which showed 2 large Mallory-Weiss tear with clots--3 endoclips have been placed to close mucosal defect, superficial fundic ulcers. Pharmacy consulted for heparin dosing on 11/19 with plan to restart warfarin in 3 days per GI on 11/22. Will give first dose of warfarin today.  PTA warfarin regimen: 6mg  daily except 3mg  on Tuesdays.  (goal INR 2.5-3.5 per outpatient notes)  Today, 03/09/23, PM:  HL is 0.50, therapeutic Hgb 8.8, plts 150, low but improving INR subtherapeutic at 1.1 Given recent bleed, will conservatively dose warfarin today. Per discussion with cardiology, targeting a new INR goal of 2-3 given recent bleed.  No s/sx of bleeding reported  Goal of Therapy:  INR 2-3 Heparin level 0.3-0.7 units/ml Monitor platelets by anticoagulation protocol: Yes   Plan:  Continue heparin at 1900 units/hr  Give warfarin 4.5mg  x1 tonight  Daily heparin level, INR, CBC Monitor for s/sx of bleeding  Plan for TEE/DCCV on Monday, per cardiology Continue heparin gtt until INR is within goal range on warfarin.    Cherylin Mylar, PharmD Clinical Pharmacist  11/23/20247:09 AM

## 2023-03-09 NOTE — Progress Notes (Addendum)
Progress Note  Patient Name: Alan Willis Date of Encounter: 03/09/2023  Primary Cardiologist: None   Subjective   Patient seen and examined at his bedside- son in the room visiting. No complaints today.  Inpatient Medications    Scheduled Meds:  sodium chloride   Intravenous Once   Chlorhexidine Gluconate Cloth  6 each Topical Daily   dofetilide  250 mcg Oral BID   feeding supplement  1 Container Oral TID BM   furosemide  40 mg Oral BID   lidocaine  1 Application Topical Once   metoprolol tartrate  25 mg Oral BID   pantoprazole (PROTONIX) IV  40 mg Intravenous Q12H   phosphorus  500 mg Oral BID   warfarin  4.5 mg Oral ONCE-1600   Warfarin - Pharmacist Dosing Inpatient   Does not apply q1600   Continuous Infusions:  heparin 1,900 Units/hr (03/09/23 0324)   levETIRAcetam Stopped (03/08/23 0959)   And   levETIRAcetam Stopped (03/08/23 2258)   PRN Meds: acetaminophen, acetaminophen, alum & mag hydroxide-simeth, bisacodyl, fentaNYL (SUBLIMAZE) injection, haloperidol lactate, magic mouthwash, menthol-cetylpyridinium, methocarbamol (ROBAXIN) injection, naphazoline-glycerin, mouth rinse, phenol, sodium chloride   Vital Signs    Vitals:   03/09/23 0404 03/09/23 0500 03/09/23 0600 03/09/23 0742  BP:  (!) 77/53 101/64   Pulse: (!) 105 (!) 108 (!) 101 (!) 105  Resp: 20 16 18 20   Temp: 97.7 F (36.5 C)     TempSrc: Oral     SpO2: 96% 96% 94% 99%  Weight:      Height:        Intake/Output Summary (Last 24 hours) at 03/09/2023 0831 Last data filed at 03/09/2023 0324 Gross per 24 hour  Intake 652.24 ml  Output 2775 ml  Net -2122.76 ml   Filed Weights   03/03/23 0813 03/03/23 1320  Weight: 79.4 kg 79.3 kg    Telemetry    Atrial ftutter  - Personally Reviewed  ECG    Atrial flutter - Personally Reviewed  Physical Exam     General: Comfortable Head: Atraumatic, normal size  Eyes: PEERLA, EOMI  Neck: Supple, normal JVD Cardiac: Normal S1, S2; RRR;  no murmurs, rubs, or gallops Lungs: Clear to auscultation bilaterally Abd: Soft, nontender, no hepatomegaly  Ext: warm, no edema Musculoskeletal: No deformities, BUE and BLE strength normal and equal Skin: Warm and dry, no rashes   Neuro: Alert and oriented to person, place, time, and situation, CNII-XII grossly intact, no focal deficits  Psych: Normal mood and affect   Labs    Chemistry Recent Labs  Lab 03/03/23 0843 03/03/23 1404 03/04/23 0242 03/05/23 0054 03/07/23 0127 03/08/23 0246 03/09/23 0310  NA 127*   < > 125*   < > 128* 132* 134*  K 4.3   < > 4.4   < > 3.7 3.7 3.7  CL 82*   < > 90*   < > 98 101 101  CO2 31   < > 28   < > 21* 23 23  GLUCOSE 136*   < > 151*   < > 128* 118* 107*  BUN 42*   < > 46*   < > 13 15 17   CREATININE 1.81*   < > 1.55*   < > 1.02 1.10 1.14  CALCIUM 9.6   < > 8.1*   < > 7.9* 7.7* 7.8*  PROT 8.0  --  5.6*  --   --   --   --   ALBUMIN 4.8  --  3.6  --   --   --   --   AST 26  --  26  --   --   --   --   ALT 13  --  13  --   --   --   --   ALKPHOS 52  --  30*  --   --   --   --   BILITOT 2.5*  --  2.3*  --   --   --   --   GFRNONAA 38*   < > 46*   < > >60 >60 >60  ANIONGAP 14   < > 7   < > 9 8 10    < > = values in this interval not displayed.     Hematology Recent Labs  Lab 03/07/23 0127 03/08/23 0246 03/09/23 0310  WBC 15.3* 12.0* 12.3*  RBC 2.60* 2.51* 2.79*  HGB 8.2* 8.0* 8.8*  HCT 24.6* 23.5* 26.8*  MCV 94.6 93.6 96.1  MCH 31.5 31.9 31.5  MCHC 33.3 34.0 32.8  RDW 13.7 14.0 13.9  PLT 117* 141* 150    Cardiac EnzymesNo results for input(s): "TROPONINI" in the last 168 hours. No results for input(s): "TROPIPOC" in the last 168 hours.   BNP Recent Labs  Lab 03/07/23 1140 03/09/23 0310  BNP 1,402.9* 471.4*     DDimer No results for input(s): "DDIMER" in the last 168 hours.   Radiology    ECHOCARDIOGRAM COMPLETE  Result Date: 03/07/2023    ECHOCARDIOGRAM REPORT   Patient Name:   GOLDEN MULDERIG [facility] Date of Exam:  03/07/2023 Medical Rec #:  [id number]          Height:       70.0 in Accession #:    [id number]         Weight:       174.8 lb Date of Birth:  October 01, 1945          BSA:          1.971 m Patient Age:    77 years           BP:           114/58 mmHg Patient Gender: M                  HR:           92 bpm. Exam Location:  Inpatient Procedure: 2D Echo, Cardiac Doppler and Color Doppler Indications:    CHF- Acute Diastolic I50.31  History:        Patient has no prior history of Echocardiogram examinations.                 CKD, Stage 2; Arrythmias:Atrial Fibrillation. Bjork-Shiley                 tilting disc valve in 1983.                 Aortic Valve: Bjork-Shiley ball and cage valve is present in the                 aortic position.  Sonographer:    Lucendia Herrlich RCS Referring Phys: [id number] RAVI PAHWANI IMPRESSIONS  1. Left ventricular ejection fraction, by estimation, is 40 to 45%. The left ventricle has mildly decreased function. The left ventricle demonstrates global hypokinesis. There is moderate left ventricular hypertrophy of the basal-septal segment. Left ventricular diastolic function could not be evaluated.  2. Right ventricular systolic function is moderately  reduced. The right ventricular size is mildly enlarged. There is moderately elevated pulmonary artery systolic pressure. The estimated right ventricular systolic pressure is 55.6 mmHg.  3. Left atrial size was severely dilated.  4. Right atrial size was severely dilated.  5. The mitral valve is degenerative. Mild to moderate mitral valve regurgitation. 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 The mean mitral valve gradient is 8.2 mmHg. Moderate to severe mitral annular calcification.  6. The aortic valve has been repaired/replaced. Aortic valve regurgitation is not visualized. There is a Bjork-Shiley ball and cage valve present in the aortic position. Aortic valve mean gradient measures 9.0 mmHg. Aortic valve Vmax measures 2.00 m/s.  7. Aortic dilatation  noted. There is mild dilatation of the ascending aorta, measuring 39 mm.  8. The inferior vena cava is dilated in size with >50% respiratory variability, suggesting right atrial pressure of 8 mmHg. FINDINGS  Left Ventricle: Left ventricular ejection fraction, by estimation, is 40 to 45%. The left ventricle has mildly decreased function. The left ventricle demonstrates global hypokinesis. The left ventricular internal cavity size was normal in size. There is  moderate left ventricular hypertrophy of the basal-septal segment. Left ventricular diastolic function could not be evaluated due to atrial fibrillation. Left ventricular diastolic function could not be evaluated. Right Ventricle: The right ventricular size is mildly enlarged. No increase in right ventricular wall thickness. Right ventricular systolic function is moderately reduced. There is moderately elevated pulmonary artery systolic pressure. The tricuspid regurgitant velocity is 3.45 m/s, and with an assumed right atrial pressure of 8 mmHg, the estimated right ventricular systolic pressure is 55.6 mmHg. Left Atrium: Left atrial size was severely dilated. Right Atrium: Right atrial size was severely dilated. Pericardium: There is no evidence of pericardial effusion. Mitral Valve: The mitral valve is degenerative in appearance. There is moderate thickening of the mitral valve leaflet(s). There is moderate calcification of the mitral valve leaflet(s). Moderate to severe mitral annular calcification. Mild to moderate mitral valve regurgitation. 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 MV peak gradient, 16.9 mmHg. The mean mitral valve gradient is 8.2 mmHg. Tricuspid Valve: The tricuspid valve is normal in structure. Tricuspid valve regurgitation is mild . No evidence of tricuspid stenosis. Aortic Valve: The aortic valve has been repaired/replaced. Aortic valve regurgitation is not visualized. Aortic valve mean gradient measures 9.0 mmHg. Aortic valve peak  gradient measures 16.0 mmHg. Aortic valve area, by VTI measures 1.42 cm. There is a Bjork-Shiley ball and cage valve present in the aortic position. Pulmonic Valve: The pulmonic valve was normal in structure. Pulmonic valve regurgitation is trivial. No evidence of pulmonic stenosis. Aorta: Aortic dilatation noted. There is mild dilatation of the ascending aorta, measuring 39 mm. Venous: The inferior vena cava is dilated in size with greater than 50% respiratory variability, suggesting right atrial pressure of 8 mmHg. IAS/Shunts: No atrial level shunt detected by color flow Doppler.  LEFT VENTRICLE PLAX 2D LVIDd:         5.60 cm      Diastology LVIDs:         4.30 cm      LV e' medial:    6.57 cm/s LV PW:         1.10 cm      LV E/e' medial:  28.9 LV IVS:        1.30 cm      LV e' lateral:   9.57 cm/s LVOT diam:     2.00 cm      LV E/e'  lateral: 19.9 LV SV:         38 LV SV Index:   19 LVOT Area:     3.14 cm  LV Volumes (MOD) LV vol d, MOD A2C: 122.0 ml LV vol d, MOD A4C: 138.0 ml LV vol s, MOD A2C: 72.4 ml LV vol s, MOD A4C: 86.6 ml LV SV MOD A2C:     49.6 ml LV SV MOD A4C:     138.0 ml LV SV MOD BP:      48.4 ml RIGHT VENTRICLE            IVC RV S prime:     7.09 cm/s  IVC diam: 2.10 cm TAPSE (M-mode): 1.3 cm LEFT ATRIUM              Index        RIGHT ATRIUM           Index LA diam:        5.20 cm  2.64 cm/m   RA Area:     27.20 cm LA Vol (A2C):   120.0 ml 60.87 ml/m  RA Volume:   84.60 ml  42.92 ml/m LA Vol (A4C):   106.0 ml 53.77 ml/m LA Biplane Vol: 121.0 ml 61.38 ml/m  AORTIC VALVE AV Area (Vmax):    1.23 cm AV Area (Vmean):   1.19 cm AV Area (VTI):     1.42 cm AV Vmax:           200.00 cm/s AV Vmean:          135.333 cm/s AV VTI:            0.265 m AV Peak Grad:      16.0 mmHg AV Mean Grad:      9.0 mmHg LVOT Vmax:         78.10 cm/s LVOT Vmean:        51.267 cm/s LVOT VTI:          0.120 m LVOT/AV VTI ratio: 0.45  AORTA Ao Root diam: 3.50 cm Ao Asc diam:  3.90 cm MITRAL VALVE                   TRICUSPID VALVE MV Area (PHT): 3.74 cm       TR Peak grad:   47.6 mmHg MV Area VTI:   1.08 cm       TR Vmax:        345.00 cm/s MV Peak grad:  16.9 mmHg MV Mean grad:  8.2 mmHg       SHUNTS MV Vmax:       2.05 m/s       Systemic VTI:  0.12 m MV Vmean:      136.5 cm/s     Systemic Diam: 2.00 cm MV Decel Time: 203 msec MR Peak grad:    92.5 mmHg MR Mean grad:    74.0 mmHg MR Vmax:         481.00 cm/s MR Vmean:        408.0 cm/s MR PISA:         1.01 cm MR PISA Eff ROA: 8 mm MR PISA Radius:  0.40 cm MV E velocity: 190.00 cm/s MV A velocity: 104.00 cm/s MV E/A ratio:  1.83 Armanda Magic MD Electronically signed by Armanda Magic MD Signature Date/Time: 03/07/2023/3:16:08 PM    Final    DG CHEST PORT 1 VIEW  Result Date: 03/07/2023 CLINICAL DATA:  77 year old male with hypoxia. EXAM:  PORTABLE CHEST 1 VIEW COMPARISON:  Portable chest 03/03/2023 and earlier. FINDINGS: Portable AP semi upright view at 0851 hours. Naso enteric tube removed. Stable lung volumes and mediastinal contours but new patchy and indistinct perihilar and interstitial opacity. Evidence of a small left pleural effusion. No pneumothorax. No air bronchograms. No right pleural effusion. Negative visible bowel gas. IMPRESSION: 1. Nasoenteric tube removed. 2. New perihilar and interstitial opacity with evidence of a small left pleural effusion. Favor acute pulmonary edema. Atypical/viral respiratory infection felt less likely. Electronically Signed   By: Odessa Fleming M.D.   On: 03/07/2023 09:42    Cardiac Studies   Echo reviewed   Patient Profile     77 y.o. 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   Assessment & Plan    Atrial flutter -he remains in atrial arrhythmia.  His heart rate has gotten better since he was placed back on his metoprolol.  Blood pressure though is marginal we will continue to monitor.  He has been placed back on his dofetilide.  And  started back on anticoagulation day of warfarin yesterday, had been on heparin drip-anticoagulation cleared by GI. Plan for TEE/DCCV on Monday.   Hemorrhagic shock-status post EGD noted to have a Mallory-Weiss tear this was treated by hemostatic clips.  He will be on PPI indefinitely.  GI is on board with starting his anticoagulation giving his increased risk of stroke with atrial fibrillation/flutter.   Hypertension-he is only on metoprolol at this time his Aldactone is appropriately held due to marginal blood pressure.   Mechanical aortic valve Moderate to severe mitral regurgitation Heart failure with preserved ejection fraction Recent echo no significant gradient on the aortic valve prosthesis.  However moderate to severe mitral stenosis with mild to moderate mitral regurgitation.  This is an outpatient management. Still with negative output total net output was 2122 mL.  Agree with p.o. Lasix for today.     For questions or updates, please contact [facility] Please consult [email or web address] for contact info under Cardiology/STEMI.      Signed, Thomasene Ripple, DO  03/09/2023, 8:31 AM

## 2023-03-09 NOTE — Progress Notes (Signed)
PROGRESS NOTE    Alan Willis  GNF:[id number] DOB: 30-Mar-1946 DOA: 03/03/2023 PCP: [name], MD   Brief Narrative:  77 yo M with mech AV on warfarin, p/w GI bleed, SBO, AKI and Afib RVR.   On admission required pressors due to hemorrhagic shock and Afib.  Transferred to ICU and required vasopressors. EGD on 11/19 showed bleeding likely from [facility] tear. Heparin resumed. AKI resolved. Cards, General surgery and GI on board.  Transferred to [facility] 03/06/2023.  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  SBO appears to have resolved.  Tube removed 03/05/2023, tolerating soft diet.  SBO resolved.  Will advance to cardiac diet.  Hemorrhagic shock secondary to acute blood loss anemia secondary to upper GI bleed/Mallory-Weiss tear: Status post 3 units of PRBC transfusion, last on 03/05/2023 and 3 units of frozen plasma transfusion, last on 03/04/2023, status post EGD 11/19 with two large non-bleeding Mallory-Weiss tear with stigmata of recent bleeding and two large clots at site s/p clips x 3. Also two nonbleeding lineal and superficial gastric ulcers with a clean ulcer base in gastric fundus. S/p biopsy from duodenal bulb for severe mucosal changes with erythema, inflammation and nodularity.  GI recommends PPI BID x 2 months followed by daily.  After clearance from GI, patient was started on heparin on  03/05/2023 and GI recommended to resume Coumadin in 3 days so that was resumed on 03/08/2023.  Hemoglobin stable.  Continue heparin bridging until INR becomes therapeutic with goal to keep between 2-3.  History of mechanical aortic valve replacement on chronic anticoagulation with Coumadin/supratherapeutic INR: Continue Coumadin with bridging heparin with a goal INR of 2-3 per cardiology recommendation.  Permanent atrial fibrillation with RVR on admission: On Tikosyn PTA, was unable to take for several days prior to admission.  Resumed Tikosyn.  Goes in and out of rapid A-fib.  Has been started on low-dose of metoprolol.  Cardiology managing.  Currently in sinus rhythm.  Noted plan for TEE/cardioversion Monday.  Fever: Patient has been having persistent low-grade fever since afternoon of 03/05/2023.  Patient has no symptoms suggesting of any particular infection.  Due to history of mechanical valve replacement, will check blood culture, procalcitonin and lactic acid.  Currently afebrile.  Monitor closely.  Acute hypoxic respiratory failure secondary to acute on chronic congestive heart failure with preserved ejection fraction: Significantly improved, weaned down to room air.  Had significant diuresis.  Has been transitioned to oral Lasix.  Delirium/seizures/memory issues: Continue Keppra, seizure precautions, aspiration precautions.  Currently fully alert and oriented.  I turned on all the lights in his room and advised his wife as well as primary RN at the bedside to do so during the daytime to avoid delirium. 1. Avoid benzodiazepines, antihistamines, anticholinergics, and minimize opiate use as these may worsen delirium. 2: Assess, prevent and manage pain as lack of treatment  can result in delirium.  3: Provide appropriate lighting and clear signage; a clock and calendar should be easily visible to the patient. 4: Monitor environmental factors. Reduce light and noise at night (close shades, turn off  lights, turn off TV, ect). Correct any alterations in sleep cycle. 5: Reorient the patient to person, place, time and situation on each encounter.  6: Correct sensory deficits if possible (replace eye glasses, hearing aids, ect). 7: Avoid restraints if able. Severely delirious patients benefit from constant observation by a sitter.  Hypophosphatemia: Solved.  Acute hyponatremia: Likely hypovolemic.  Presented with 127, went as low as 125 but currently 134.  DVT prophylaxis: SCDs Start: 03/03/23 1402 heparin   Code Status: Full Code  Family Communication: Son present at bedside.  Plan of care discussed with patient and his wife in length and he/she verbalized understanding and agreed with it.  Status is: Inpatient Remains inpatient appropriate because: Patient with multiple active issues being managed and needs to be in the hospital.   Estimated body mass index is 25.08 kg/m as calculated from the following:   Height as of this encounter: 5\' 10"  (1.778 m).   Weight as of this encounter: 79.3 kg.    Nutritional Assessment: Body mass index is 25.08 kg/m.Marland Kitchen Seen by dietician.  I agree with the assessment and plan as outlined below: Nutrition Status:        . Skin Assessment: I have examined the patient's skin and I agree with the wound assessment as performed by the wound care RN as outlined below:    Consultants:  GI, general surgery and cardiology  Procedures:  As above  Antimicrobials:  Anti-infectives (From admission, onward)    None         Subjective: Patient seen and examined.  No complaints.  Son at the bedside.  Objective: Vitals:   03/09/23 1111 03/09/23 1156 03/09/23 1200 03/09/23 1300  BP:   (!) 99/57   Pulse: 65 66 (!) 46 (!) 51  Resp: 19 18 (!) 22 (!) 24  Temp:   97.8 F (36.6 C)   TempSrc:   Oral   SpO2: 100% 99% 98% 96%  Weight:      Height:        Intake/Output Summary (Last 24 hours) at 03/09/2023 1349 Last data filed at 03/09/2023  1342 Gross per 24 hour  Intake 1020.19 ml  Output 850 ml  Net 170.19 ml   Filed Weights   03/03/23 0813 03/03/23 1320  Weight: 79.4 kg 79.3 kg    Examination:  General exam: Appears calm and comfortable  Respiratory system: Very faint basal crackles. Respiratory effort normal. Cardiovascular system: S1 & S2 heard, RRR. No JVD, murmurs, rubs, gallops or clicks. No pedal edema. Gastrointestinal system: Abdomen is nondistended, soft and nontender. No organomegaly or masses felt. Normal bowel sounds heard. Central nervous system: Alert and oriented. No focal neurological deficits. Extremities: Symmetric 5 x 5 power. Skin: No rashes, lesions or ulcers.  Psychiatry: Judgement and insight appear normal. Mood & affect appropriate.   Data Reviewed: I have personally reviewed following labs and imaging studies  CBC: Recent Labs  Lab 03/03/23 0843 03/03/23 1404 03/03/23 1922 03/04/23 0012 03/05/23 2244 03/06/23 0258 03/07/23 0127 03/08/23 0246 03/09/23 0310  WBC 21.7*  --  33.7*   < > 8.3 8.6 15.3* 12.0* 12.3*  NEUTROABS 18.6*  --  29.8*  --   --   --   --   --   --  HGB 16.2   < > 11.6*   < > 8.3* 8.1* 8.2* 8.0* 8.8*  HCT 45.9   < > 33.6*   < > 24.4* 24.4* 24.6* 23.5* 26.8*  MCV 87.1  --  92.1   < > 93.1 95.7 94.6 93.6 96.1  PLT 181  --  155   < > 73* 72* 117* 141* 150   < > = values in this interval not displayed.   Basic Metabolic Panel: Recent Labs  Lab 03/03/23 1404 03/03/23 1922 03/05/23 0054 03/06/23 0258 03/07/23 0127 03/07/23 1140 03/08/23 0246 03/09/23 0310  NA 126*   < > 132* 133* 128*  --  132* 134*  K 4.4   < > 4.0 3.5 3.7  --  3.7 3.7  CL 84*   < > 99 103 98  --  101 101  CO2 28   < > 28 20* 21*  --  23 23  GLUCOSE 146*   < > 92 94 128*  --  118* 107*  BUN 50*   < > 35* 19 13  --  15 17  CREATININE 1.76*   < > 1.34* 1.16 1.02  --  1.10 1.14  CALCIUM 9.0   < > 8.0* 7.9* 7.9*  --  7.7* 7.8*  MG 2.0  --  2.3 2.1 1.9  --  2.3 2.1  PHOS 4.0  --   --   1.7*  --  1.6*  --  2.6   < > = values in this interval not displayed.   GFR: Estimated Creatinine Clearance: 56.9 mL/min (by C-G formula based on SCr of 1.14 mg/dL). Liver Function Tests: Recent Labs  Lab 03/03/23 0843 03/04/23 0242  AST 26 26  ALT 13 13  ALKPHOS 52 30*  BILITOT 2.5* 2.3*  PROT 8.0 5.6*  ALBUMIN 4.8 3.6   Recent Labs  Lab 03/03/23 1048  LIPASE <10*   No results for input(s): "AMMONIA" in the last 168 hours. Coagulation Profile: Recent Labs  Lab 03/05/23 0054 03/06/23 0258 03/07/23 0127 03/08/23 0246 03/09/23 0310  INR 1.4* 1.1 1.1 1.2 1.1   Cardiac Enzymes: No results for input(s): "CKTOTAL", "CKMB", "CKMBINDEX", "TROPONINI" in the last 168 hours. BNP (last 3 results) No results for input(s): "PROBNP" in the last 8760 hours. HbA1C: No results for input(s): "HGBA1C" in the last 72 hours. CBG: No results for input(s): "GLUCAP" in the last 168 hours. Lipid Profile: No results for input(s): "CHOL", "HDL", "LDLCALC", "TRIG", "CHOLHDL", "LDLDIRECT" in the last 72 hours. Thyroid Function Tests: No results for input(s): "TSH", "T4TOTAL", "FREET4", "T3FREE", "THYROIDAB" in the last 72 hours. Anemia Panel: No results for input(s): "VITAMINB12", "FOLATE", "FERRITIN", "TIBC", "IRON", "RETICCTPCT" in the last 72 hours. Sepsis Labs: Recent Labs  Lab 03/03/23 1922 03/04/23 0012 03/04/23 0242 03/06/23 1052  PROCALCITON  --   --   --  0.20  LATICACIDVEN 2.2* 2.3* 2.2* 1.0    Recent Results (from the past 240 hour(s))  MRSA Next Gen by PCR, Nasal     Status: None   Collection Time: 03/04/23  2:30 AM   Specimen: Nasal Mucosa; Nasal Swab  Result Value Ref Range Status   MRSA by PCR Next Gen NOT DETECTED NOT DETECTED Final    Comment: (NOTE) The GeneXpert MRSA Assay (FDA approved for NASAL specimens only), is one component of a comprehensive MRSA colonization surveillance program. It is not intended to diagnose MRSA infection nor to guide or monitor  treatment for MRSA infections. Test performance  is not FDA approved in patients less than 52 years old. Performed at [facility], [address] [place], [place] [address]   Culture, blood (Routine X 2) w Reflex to ID Panel     Status: None (Preliminary result)   Collection Time: 03/06/23 10:52 AM   Specimen: BLOOD LEFT ARM  Result Value Ref Range Status   Specimen Description   Final    BLOOD LEFT ARM Performed at [facility], [address] [place], [place] [address]    Special Requests   Final    BOTTLES DRAWN AEROBIC AND ANAEROBIC Blood Culture adequate volume Performed at [facility], [address] [place], [place] [address]    Culture   Final    NO GROWTH 3 DAYS Performed at [facility], [address] [place], [place] [address]    Report Status PENDING  Incomplete  Culture, blood (Routine X 2) w Reflex to ID Panel     Status: None (Preliminary result)   Collection Time: 03/06/23 11:08 AM   Specimen: BLOOD  Result Value Ref Range Status   Specimen Description   Final    BLOOD SITE NOT SPECIFIED Performed at [facility], [address] [place], [place] [address]    Special Requests   Final    BOTTLES DRAWN AEROBIC ONLY Blood Culture results may not be optimal due to an inadequate volume of blood received in culture bottles Performed at [facility], [address], [place], [place] [address]    Culture   Final    NO GROWTH 3 DAYS Performed at [facility], [address], [place], [place] [address]    Report Status PENDING  Incomplete     Radiology Studies: ECHOCARDIOGRAM COMPLETE  Result Date: 03/07/2023    ECHOCARDIOGRAM REPORT   Patient Name:   Alan Willis Date of Exam: 03/07/2023 Medical Rec #:  [id number]          Height:       70.0 in Accession #:    [id number]         Weight:       174.8 lb Date of Birth:  05/26/1945          BSA:          1.971 m  Patient Age:    77 years           BP:           114/58 mmHg Patient Gender: M                  HR:           92 bpm. Exam Location:  Inpatient Procedure: 2D Echo, Cardiac Doppler and Color Doppler Indications:    CHF- Acute Diastolic I50.31  History:        Patient has no prior history of Echocardiogram examinations.                 CKD, Stage 2; Arrythmias:Atrial Fibrillation. Bjork-Shiley                 tilting disc valve in 1983.                 Aortic Valve: Bjork-Shiley ball and cage valve is present in the                 aortic position.  Sonographer:    Lucendia Herrlich RCS  Referring Phys: [id number] Allona Gondek IMPRESSIONS  1. Left ventricular ejection fraction, by estimation, is 40 to 45%. The left ventricle has mildly decreased function. The left ventricle demonstrates global hypokinesis. There is moderate left ventricular hypertrophy of the basal-septal segment. Left ventricular diastolic function could not be evaluated.  2. Right ventricular systolic function is moderately reduced. The right ventricular size is mildly enlarged. There is moderately elevated pulmonary artery systolic pressure. The estimated right ventricular systolic pressure is 55.6 mmHg.  3. Left atrial size was severely dilated.  4. Right atrial size was severely dilated.  5. The mitral valve is degenerative. Mild to moderate mitral valve regurgitation. 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 The mean mitral valve gradient is 8.2 mmHg. Moderate to severe mitral annular calcification.  6. The aortic valve has been repaired/replaced. Aortic valve regurgitation is not visualized. There is a Bjork-Shiley ball and cage valve present in the aortic position. Aortic valve mean gradient measures 9.0 mmHg. Aortic valve Vmax measures 2.00 m/s.  7. Aortic dilatation noted. There is mild dilatation of the ascending aorta, measuring 39 mm.  8. The inferior vena cava is dilated in size with >50% respiratory variability, suggesting right atrial pressure  of 8 mmHg. FINDINGS  Left Ventricle: Left ventricular ejection fraction, by estimation, is 40 to 45%. The left ventricle has mildly decreased function. The left ventricle demonstrates global hypokinesis. The left ventricular internal cavity size was normal in size. There is  moderate left ventricular hypertrophy of the basal-septal segment. Left ventricular diastolic function could not be evaluated due to atrial fibrillation. Left ventricular diastolic function could not be evaluated. Right Ventricle: The right ventricular size is mildly enlarged. No increase in right ventricular wall thickness. Right ventricular systolic function is moderately reduced. There is moderately elevated pulmonary artery systolic pressure. The tricuspid regurgitant velocity is 3.45 m/s, and with an assumed right atrial pressure of 8 mmHg, the estimated right ventricular systolic pressure is 55.6 mmHg. Left Atrium: Left atrial size was severely dilated. Right Atrium: Right atrial size was severely dilated. Pericardium: There is no evidence of pericardial effusion. Mitral Valve: The mitral valve is degenerative in appearance. There is moderate thickening of the mitral valve leaflet(s). There is moderate calcification of the mitral valve leaflet(s). Moderate to severe mitral annular calcification. Mild to moderate mitral valve regurgitation. 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 MV peak gradient, 16.9 mmHg. The mean mitral valve gradient is 8.2 mmHg. Tricuspid Valve: The tricuspid valve is normal in structure. Tricuspid valve regurgitation is mild . No evidence of tricuspid stenosis. Aortic Valve: The aortic valve has been repaired/replaced. Aortic valve regurgitation is not visualized. Aortic valve mean gradient measures 9.0 mmHg. Aortic valve peak gradient measures 16.0 mmHg. Aortic valve area, by VTI measures 1.42 cm. There is a Bjork-Shiley ball and cage valve present in the aortic position. Pulmonic Valve: The pulmonic valve was  normal in structure. Pulmonic valve regurgitation is trivial. No evidence of pulmonic stenosis. Aorta: Aortic dilatation noted. There is mild dilatation of the ascending aorta, measuring 39 mm. Venous: The inferior vena cava is dilated in size with greater than 50% respiratory variability, suggesting right atrial pressure of 8 mmHg. IAS/Shunts: No atrial level shunt detected by color flow Doppler.  LEFT VENTRICLE PLAX 2D LVIDd:         5.60 cm      Diastology LVIDs:         4.30 cm      LV e' medial:    6.57 cm/s LV PW:  1.10 cm      LV E/e' medial:  28.9 LV IVS:        1.30 cm      LV e' lateral:   9.57 cm/s LVOT diam:     2.00 cm      LV E/e' lateral: 19.9 LV SV:         38 LV SV Index:   19 LVOT Area:     3.14 cm  LV Volumes (MOD) LV vol d, MOD A2C: 122.0 ml LV vol d, MOD A4C: 138.0 ml LV vol s, MOD A2C: 72.4 ml LV vol s, MOD A4C: 86.6 ml LV SV MOD A2C:     49.6 ml LV SV MOD A4C:     138.0 ml LV SV MOD BP:      48.4 ml RIGHT VENTRICLE            IVC RV S prime:     7.09 cm/s  IVC diam: 2.10 cm TAPSE (M-mode): 1.3 cm LEFT ATRIUM              Index        RIGHT ATRIUM           Index LA diam:        5.20 cm  2.64 cm/m   RA Area:     27.20 cm LA Vol (A2C):   120.0 ml 60.87 ml/m  RA Volume:   84.60 ml  42.92 ml/m LA Vol (A4C):   106.0 ml 53.77 ml/m LA Biplane Vol: 121.0 ml 61.38 ml/m  AORTIC VALVE AV Area (Vmax):    1.23 cm AV Area (Vmean):   1.19 cm AV Area (VTI):     1.42 cm AV Vmax:           200.00 cm/s AV Vmean:          135.333 cm/s AV VTI:            0.265 m AV Peak Grad:      16.0 mmHg AV Mean Grad:      9.0 mmHg LVOT Vmax:         78.10 cm/s LVOT Vmean:        51.267 cm/s LVOT VTI:          0.120 m LVOT/AV VTI ratio: 0.45  AORTA Ao Root diam: 3.50 cm Ao Asc diam:  3.90 cm MITRAL VALVE                  TRICUSPID VALVE MV Area (PHT): 3.74 cm       TR Peak grad:   47.6 mmHg MV Area VTI:   1.08 cm       TR Vmax:        345.00 cm/s MV Peak grad:  16.9 mmHg MV Mean grad:  8.2 mmHg       SHUNTS  MV Vmax:       2.05 m/s       Systemic VTI:  0.12 m MV Vmean:      136.5 cm/s     Systemic Diam: 2.00 cm MV Decel Time: 203 msec MR Peak grad:    92.5 mmHg MR Mean grad:    74.0 mmHg MR Vmax:         481.00 cm/s MR Vmean:        408.0 cm/s MR PISA:         1.01 cm MR PISA Eff ROA: 8 mm MR PISA Radius:  0.40 cm MV E  velocity: 190.00 cm/s MV A velocity: 104.00 cm/s MV E/A ratio:  1.83 Armanda Magic MD Electronically signed by Armanda Magic MD Signature Date/Time: 03/07/2023/3:16:08 PM    Final     Scheduled Meds:  sodium chloride   Intravenous Once   Chlorhexidine Gluconate Cloth  6 each Topical Daily   dofetilide  250 mcg Oral BID   feeding supplement  1 Container Oral TID BM   furosemide  40 mg Oral BID   lidocaine  1 Application Topical Once   metoprolol tartrate  25 mg Oral BID   pantoprazole (PROTONIX) IV  40 mg Intravenous Q12H   phosphorus  500 mg Oral BID   warfarin  4.5 mg Oral ONCE-1600   Warfarin - Pharmacist Dosing Inpatient   Does not apply q1600   Continuous Infusions:  heparin 1,900 Units/hr (03/09/23 1342)   levETIRAcetam Stopped (03/09/23 1209)   And   levETIRAcetam Stopped (03/08/23 2258)   potassium chloride 10 mEq (03/09/23 1343)     LOS: 6 days   Hughie Closs, MD [facility]  03/09/2023, 1:49 PM   *Please note that this is a verbal dictation therefore any spelling or grammatical errors are due to the "Dragon Medical One" system interpretation.  Please page via Amion and do not message via secure chat for urgent patient care matters. Secure chat can be used for non urgent patient care matters.  How to contact the [facility] Attending or Consulting provider 7A - 7P or covering provider during after hours 7P -7A, for this patient?  Check the care team in [facility] and look for a) attending/consulting [facility] provider listed and b) the [facility] team listed. Page or secure chat 7A-7P. Log into [email or web address] and use [place]'s universal password to access. If you do not have the  password, please contact the hospital operator. Locate the [facility] provider you are looking for under [facility] and page to a number that you can be directly reached. If you still have difficulty reaching the provider, please page the [facility] (Director on Call) for the Hospitalists listed on amion for assistance.

## 2023-03-09 NOTE — Plan of Care (Signed)
  Problem: Clinical Measurements: Goal: Respiratory complications will improve Outcome: Progressing   Problem: Activity: Goal: Risk for activity intolerance will decrease Outcome: Progressing   Problem: Pain Management: Goal: General experience of comfort will improve Outcome: Progressing   Problem: Safety: Goal: Ability to remain free from injury will improve Outcome: Progressing   Problem: Skin Integrity: Goal: Risk for impaired skin integrity will decrease Outcome: Progressing

## 2023-03-10 ENCOUNTER — Encounter (HOSPITAL_COMMUNITY): Payer: Self-pay | Admitting: Anesthesiology

## 2023-03-10 DIAGNOSIS — K920 Hematemesis: Secondary | ICD-10-CM | POA: Diagnosis not present

## 2023-03-10 DIAGNOSIS — I480 Paroxysmal atrial fibrillation: Secondary | ICD-10-CM | POA: Diagnosis not present

## 2023-03-10 LAB — BASIC METABOLIC PANEL
Anion gap: 8 (ref 5–15)
BUN: 17 mg/dL (ref 8–23)
CO2: 21 mmol/L — ABNORMAL LOW (ref 22–32)
Calcium: 8 mg/dL — ABNORMAL LOW (ref 8.9–10.3)
Chloride: 101 mmol/L (ref 98–111)
Creatinine, Ser: 0.96 mg/dL (ref 0.61–1.24)
GFR, Estimated: >60 mL/min (ref >60)
Glucose, Bld: 102 mg/dL — ABNORMAL HIGH (ref 70–99)
Potassium: 3.8 mmol/L (ref 3.5–5.1)
Sodium: 130 mmol/L — ABNORMAL LOW (ref 135–145)

## 2023-03-10 LAB — CBC
HCT: 26.1 % — ABNORMAL LOW (ref 39.0–52.0)
Hemoglobin: 8.3 g/dL — ABNORMAL LOW (ref 13.0–17.0)
MCH: 30.7 pg (ref 26.0–34.0)
MCHC: 31.8 g/dL (ref 30.0–36.0)
MCV: 96.7 fL (ref 80.0–100.0)
Platelets: 173 10*3/uL (ref 150–400)
RBC: 2.7 MIL/uL — ABNORMAL LOW (ref 4.22–5.81)
RDW: 13.7 % (ref 11.5–15.5)
WBC: 11.3 10*3/uL — ABNORMAL HIGH (ref 4.0–10.5)
nRBC: 0 % (ref 0.0–0.2)

## 2023-03-10 LAB — MAGNESIUM: Magnesium: 2 mg/dL (ref 1.7–2.4)

## 2023-03-10 LAB — PROTIME-INR
INR: 1.1 (ref 0.8–1.2)
Prothrombin Time: 14.7 s (ref 11.4–15.2)

## 2023-03-10 LAB — HEPARIN LEVEL (UNFRACTIONATED): Heparin Unfractionated: 0.53 [IU]/mL (ref 0.30–0.70)

## 2023-03-10 MED ORDER — HALOPERIDOL LACTATE 5 MG/ML IJ SOLN
5.0000 mg | Freq: Four times a day (QID) | INTRAMUSCULAR | Status: DC | PRN
  Administered 2023-03-16: 5 mg via INTRAVENOUS
  Filled 2023-03-10: qty 1

## 2023-03-10 MED ORDER — WARFARIN SODIUM 4 MG PO TABS
4.5000 mg | ORAL_TABLET | Freq: Once | ORAL | Status: AC
  Administered 2023-03-10: 4.5 mg via ORAL
  Filled 2023-03-10: qty 1

## 2023-03-10 MED ORDER — POTASSIUM CHLORIDE 10 MEQ/100ML IV SOLN
10.0000 meq | INTRAVENOUS | Status: AC
  Administered 2023-03-10 (×4): 10 meq via INTRAVENOUS
  Filled 2023-03-10 (×4): qty 100

## 2023-03-10 NOTE — Progress Notes (Signed)
Physical Therapy Treatment Patient Details Name: Alan Willis MRN: [id number] DOB: [birth year] Today's Date: 03/10/2023   History of Present Illness 77 year old male with a past medical history atrial fibrillation, history of ablation, mitral regurgitation, mitral valve stenosis, aortic valve replacement on warfarin who presented to the emergency department 03/03/23  with complaints of melena, hematemesis, abdominal distention, abdominal pain with workup showing SBO, AKI and positive occult blood in stool,Hemorrhagic shock secondary to acute blood loss anemia secondary to upper GI bleed, new small left pleural effusion, requiring increased Oxygen.    PT Comments  Pt very motivated and eager to ambulate in hall.  Pt up to ambulate increased distance but with RW and limited assist for stability.  Pt denies dizziness/SOB.  BP supine 89/61; sitting 92/73; standing 96/65; after ambulating 100/74.    If plan is discharge home, recommend the following: A little help with walking and/or transfers;A little help with bathing/dressing/bathroom;Assistance with cooking/housework;Assist for transportation;Help with stairs or ramp for entrance   Can travel by private vehicle        Equipment Recommendations       Recommendations for Other Services       Precautions / Restrictions Precautions Precautions: Fall Precaution Comments: monitor sats, HR, BP Restrictions Weight Bearing Restrictions: No     Mobility  Bed Mobility Overal bed mobility: Needs Assistance Bed Mobility: Supine to Sit     Supine to sit: Supervision     General bed mobility comments: Performed bed mobility without any assistance.    Transfers Overall transfer level: Needs assistance Equipment used: Rolling walker (2 wheels) Transfers: Sit to/from Stand Sit to Stand: Supervision           General transfer comment: cues for use of UEs to self assist    Ambulation/Gait Ambulation/Gait assistance: Min  assist, Contact guard assist Gait Distance (Feet): 350 Feet Assistive device: Rolling walker (2 wheels) Gait Pattern/deviations: Step-through pattern, Narrow base of support Gait velocity: decr     General Gait Details: cues for posture, position from RW and to increase BOS   Stairs             Wheelchair Mobility     Tilt Bed    Modified Rankin (Stroke Patients Only)       Balance Overall balance assessment: Needs assistance Sitting-balance support: No upper extremity supported, Feet supported Sitting balance-Leahy Scale: Good     Standing balance support: No upper extremity supported, During functional activity Standing balance-Leahy Scale: Fair                              Cognition Arousal: Alert Behavior During Therapy: WFL for tasks assessed/performed Overall Cognitive Status: Within Functional Limits for tasks assessed Area of Impairment: Safety/judgement                 Orientation Level: Place, Time, Situation, Person                      Exercises      General Comments        Pertinent Vitals/Pain Pain Assessment Pain Assessment: No/denies pain    Home Living                          Prior Function            PT Goals (current goals can now be found in the care plan section)  Acute Rehab PT Goals Patient Stated Goal: go home, walk the dogs PT Goal Formulation: With patient/family Time For Goal Achievement: 03/21/23 Potential to Achieve Goals: Good Progress towards PT goals: Progressing toward goals    Frequency    Min 1X/week      PT Plan      Co-evaluation              AM-PAC PT "6 Clicks" Mobility   Outcome Measure  Help needed turning from your back to your side while in a flat bed without using bedrails?: None Help needed moving from lying on your back to sitting on the side of a flat bed without using bedrails?: None Help needed moving to and from a bed to a chair  (including a wheelchair)?: A Little Help needed standing up from a chair using your arms (e.g., wheelchair or bedside chair)?: A Little Help needed to walk in hospital room?: A Little Help needed climbing 3-5 steps with a railing? : A Lot 6 Click Score: 19    End of Session Equipment Utilized During Treatment: Gait belt Activity Tolerance: Patient tolerated treatment well Patient left: in chair;with call bell/phone within reach;with family/visitor present Nurse Communication: Mobility status PT Visit Diagnosis: Unsteadiness on feet (R26.81);Difficulty in walking, not elsewhere classified (R26.2)     Time: 4098-1191 PT Time Calculation (min) (ACUTE ONLY): 27 min  Charges:    $Gait Training: 23-37 mins PT General Charges $$ ACUTE PT VISIT: 1 Visit                     Alan Willis PT Acute Rehabilitation Services Pager [phone] Office [phone]    Alan Willis 03/10/2023, 1:08 PM

## 2023-03-10 NOTE — Progress Notes (Signed)
PROGRESS NOTE    Alan Willis  UJW:[id number] DOB: 1946-01-29 DOA: 03/03/2023 PCP: [name], MD   Brief Narrative:  77 yo M with mech AV on warfarin, p/w GI bleed, SBO, AKI and Afib RVR.   On admission required pressors due to hemorrhagic shock and Afib.  Transferred to ICU and required vasopressors. EGD on 11/19 showed bleeding likely from [facility] tear. Heparin resumed. AKI resolved. Cards, General surgery and GI on board.  Transferred to [facility], [place] 03/06/2023.  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  SBO appears to have resolved.  Tube removed 03/05/2023, SBO resolved.  Tolerating regular diet.  Hemorrhagic shock secondary to acute blood loss anemia secondary to upper GI bleed/Mallory-Weiss tear: Status post 3 units of PRBC transfusion, last on 03/05/2023 and 3 units of frozen plasma transfusion, last on 03/04/2023, status post EGD 11/19 with two large non-bleeding Mallory-Weiss tear with stigmata of recent bleeding and two large clots at site s/p clips x 3. Also two nonbleeding lineal and superficial gastric ulcers with a clean ulcer base in gastric fundus. S/p biopsy from duodenal bulb for severe mucosal changes with erythema, inflammation and nodularity.  GI recommends PPI BID x 2 months followed by daily.  After clearance from GI, patient was started on heparin on 03/05/2023 and GI recommended  to resume Coumadin in 3 days so that was resumed on 03/08/2023.  Hemoglobin stable.  Continue heparin bridging until INR becomes therapeutic with goal to keep between 2-3.  Pharmacy managing Coumadin dose.  History of mechanical aortic valve replacement on chronic anticoagulation with Coumadin/supratherapeutic INR: Continue Coumadin with bridging heparin with a goal INR of 2-3 per cardiology recommendation.  Permanent atrial fibrillation with RVR on admission: On Tikosyn PTA, was unable to take for several days prior to admission.  Resumed Tikosyn.  Goes in and out of rapid A-fib.  Has been started on low-dose of metoprolol but that is on hold due to low blood pressure.  Cardiology managing.  Currently in sinus rhythm.  Noted plan for TEE/cardioversion Monday.  Fever: Patient has been having persistent low-grade fever since afternoon of 03/05/2023.  Patient has no symptoms suggesting of any particular infection.  Due to history of mechanical valve replacement, will check blood culture, procalcitonin and lactic acid.  Currently afebrile.  Monitor closely.  Acute hypoxic respiratory failure secondary to acute on chronic congestive heart failure with preserved ejection fraction: Was requiring 10 L oxygen on the morning of 03/07/2023.  Started on IV Lasix.  Significantly improved, weaned down to room air on the morning of 03/09/2023.  Had significant diuresis.  Has been transitioned to oral Lasix.  Delirium/seizures/memory issues: Continue Keppra, seizure precautions, aspiration precautions.  Currently fully alert and oriented.  I turned on all the lights in his room and advised his wife as well as primary RN at the bedside to do so  during the daytime to avoid delirium. 1. Avoid benzodiazepines, antihistamines, anticholinergics, and minimize opiate use as these may worsen delirium. 2: Assess, prevent and manage pain as lack of treatment can result in delirium.  3: Provide appropriate lighting and clear  signage; a clock and calendar should be easily visible to the patient. 4: Monitor environmental factors. Reduce light and noise at night (close shades, turn off lights, turn off TV, ect). Correct any alterations in sleep cycle. 5: Reorient the patient to person, place, time and situation on each encounter.  6: Correct sensory deficits if possible (replace eye glasses, hearing aids, ect). 7: Avoid restraints if able. Severely delirious patients benefit from constant observation by a sitter.  Hypophosphatemia: Solved.  Acute hyponatremia: Likely hypovolemic.  Presented with 127, went as low as 125 but currently 134.  DVT prophylaxis: SCDs Start: 03/03/23 1402 heparin   Code Status: Full Code  Family Communication: Son present at bedside.  Plan of care discussed with patient and his wife in length and he/she verbalized understanding and agreed with it.  Status is: Inpatient Remains inpatient appropriate because: Patient with multiple active issues being managed and needs to be in the hospital.   Estimated body mass index is 25.08 kg/m as calculated from the following:   Height as of this encounter: 5\' 10"  (1.778 m).   Weight as of this encounter: 79.3 kg.    Nutritional Assessment: Body mass index is 25.08 kg/m.Marland Kitchen Seen by dietician.  I agree with the assessment and plan as outlined below: Nutrition Status:        . Skin Assessment: I have examined the patient's skin and I agree with the wound assessment as performed by the wound care RN as outlined below:    Consultants:  GI, general surgery and cardiology  Procedures:  As above  Antimicrobials:  Anti-infectives (From admission, onward)    None         Subjective: Patient seen and examined.  Wife at the bedside.  Patient has no complaints.  Objective: Vitals:   03/10/23 0800 03/10/23 0900 03/10/23 0941 03/10/23 1000  BP:   (!) 89/63 105/63  Pulse:  (!) 106 (!) 105 (!) 105  Resp: (!) 23 (!) 25  (!) 23  Temp:  97.7 F (36.5 C)     TempSrc: Axillary     SpO2: 100% 98%  100%  Weight:      Height:        Intake/Output Summary (Last 24 hours) at 03/10/2023 1122 Last data filed at 03/10/2023 1025 Gross per 24 hour  Intake 2592.91 ml  Output 2500 ml  Net 92.91 ml   Filed Weights   03/03/23 0813 03/03/23 1320  Weight: 79.4 kg 79.3 kg    Examination:  General exam: Appears calm and comfortable  Respiratory system: Clear to auscultation. Respiratory effort normal. Cardiovascular system: S1 & S2 heard, RRR. No JVD, murmurs, rubs, gallops or clicks. No pedal edema. Gastrointestinal system: Abdomen is nondistended, soft and nontender. No organomegaly or masses felt. Normal bowel sounds heard. Central nervous system: Alert and oriented. No focal neurological deficits. Extremities: Symmetric 5 x 5 power. Skin: No rashes, lesions or ulcers.  Psychiatry: Judgement and insight appear normal. Mood & affect appropriate.   Data Reviewed: I have personally reviewed following labs and imaging studies  CBC: Recent Labs  Lab 03/03/23 1922 03/04/23 0012 03/06/23 0258 03/07/23 0127 03/08/23 0246 03/09/23 0310 03/10/23 0301  WBC 33.7*   < > 8.6 15.3* 12.0* 12.3* 11.3*  NEUTROABS 29.8*  --   --   --   --   --   --   HGB 11.6*   < > 8.1* 8.2* 8.0* 8.8* 8.3*  HCT 33.6*   < > 24.4* 24.6* 23.5* 26.8* 26.1*  MCV 92.1   < > 95.7 94.6 93.6 96.1 96.7  PLT 155   < > 72* 117* 141* 150 173   < > = values in this interval not displayed.   Basic Metabolic Panel: Recent Labs  Lab 03/03/23 1404 03/03/23 1922 03/06/23 0258 03/07/23 0127 03/07/23 1140 03/08/23 0246 03/09/23 0310 03/10/23 0301  NA 126*   < > 133* 128*  --  132* 134* 130*  K 4.4   < > 3.5 3.7  --  3.7 3.7 3.8  CL 84*   < > 103 98  --  101 101 101  CO2 28   < > 20* 21*  --  23 23 21*  GLUCOSE 146*   < > 94 128*  --  118* 107* 102*  BUN 50*   < > 19 13  --  15 17 17   CREATININE 1.76*   < > 1.16 1.02  --  1.10 1.14 0.96  CALCIUM 9.0   <  > 7.9* 7.9*  --  7.7* 7.8* 8.0*  MG 2.0   < > 2.1 1.9  --  2.3 2.1 2.0  PHOS 4.0  --  1.7*  --  1.6*  --  2.6  --    < > = values in this interval not displayed.   GFR: Estimated Creatinine Clearance: 67.6 mL/min (by C-G formula based on SCr of 0.96 mg/dL). Liver Function Tests: Recent Labs  Lab 03/04/23 0242  AST 26  ALT 13  ALKPHOS 30*  BILITOT 2.3*  PROT 5.6*  ALBUMIN 3.6   No results for input(s): "LIPASE", "AMYLASE" in the last 168 hours.  No results for input(s): "AMMONIA" in the last 168 hours. Coagulation Profile: Recent Labs  Lab 03/06/23 0258 03/07/23 0127 03/08/23 0246 03/09/23 0310 03/10/23 0301  INR 1.1 1.1 1.2 1.1 1.1   Cardiac Enzymes: No results for input(s): "CKTOTAL", "CKMB", "CKMBINDEX", "TROPONINI" in the last 168 hours. BNP (last 3 results) No results for input(s): "PROBNP" in the last 8760 hours. HbA1C: No results for input(s): "HGBA1C" in the last 72 hours. CBG: No results for input(s): "GLUCAP" in the last 168 hours. Lipid Profile: No results for input(s): "CHOL", "HDL", "LDLCALC", "TRIG", "CHOLHDL", "LDLDIRECT" in the last 72 hours. Thyroid Function Tests: No results for input(s): "TSH", "T4TOTAL", "FREET4", "T3FREE", "THYROIDAB" in the last 72 hours. Anemia Panel: No results for input(s): "VITAMINB12", "FOLATE", "FERRITIN", "TIBC", "IRON", "RETICCTPCT" in the last 72 hours. Sepsis Labs: Recent Labs  Lab 03/03/23 1922 03/04/23 0012 03/04/23 0242 03/06/23 1052  PROCALCITON  --   --   --  0.20  LATICACIDVEN 2.2* 2.3* 2.2* 1.0    Recent Results (from the past 240 hour(s))  MRSA Next Gen by PCR, Nasal     Status: None   Collection Time: 03/04/23  2:30 AM   Specimen: Nasal Mucosa; Nasal Swab  Result Value Ref Range Status   MRSA by PCR Next Gen NOT DETECTED NOT DETECTED Final    Comment: (NOTE) The GeneXpert MRSA Assay (FDA approved for NASAL specimens only), is one component of a comprehensive MRSA colonization  surveillance program. It is not intended to diagnose MRSA infection nor to guide or monitor treatment for MRSA infections. Test performance is not FDA  approved in patients less than 88 years old. Performed at [facility], [address], [place], [place] [address]   Culture, blood (Routine X 2) w Reflex to ID Panel     Status: None (Preliminary result)   Collection Time: 03/06/23 10:52 AM   Specimen: BLOOD LEFT ARM  Result Value Ref Range Status   Specimen Description   Final    BLOOD LEFT ARM Performed at [facility], [address] [place], [place] [address]    Special Requests   Final    BOTTLES DRAWN AEROBIC AND ANAEROBIC Blood Culture adequate volume Performed at [facility], [address], [place], [place] [address]    Culture   Final    NO GROWTH 4 DAYS Performed at [facility], [address], [place], [place] [address]    Report Status PENDING  Incomplete  Culture, blood (Routine X 2) w Reflex to ID Panel     Status: None (Preliminary result)   Collection Time: 03/06/23 11:08 AM   Specimen: BLOOD  Result Value Ref Range Status   Specimen Description   Final    BLOOD SITE NOT SPECIFIED Performed at [facility], [address], [place], [place] [address]    Special Requests   Final    BOTTLES DRAWN AEROBIC ONLY Blood Culture results may not be optimal due to an inadequate volume of blood received in culture bottles Performed at [facility], [address], [place], [place] [address]    Culture   Final    NO GROWTH 4 DAYS Performed at [facility], [address] [place], [place] [address]    Report Status PENDING  Incomplete     Radiology Studies: No results found.  Scheduled Meds:  sodium chloride   Intravenous Once   Chlorhexidine Gluconate Cloth  6 each Topical Daily   dofetilide  250 mcg Oral BID   feeding supplement  1 Container Oral TID BM   furosemide   40 mg Oral BID   lidocaine  1 Application Topical Once   metoprolol tartrate  25 mg Oral BID   pantoprazole (PROTONIX) IV  40 mg Intravenous Q12H   phosphorus  500 mg Oral BID   warfarin  4.5 mg Oral ONCE-1600   Warfarin - Pharmacist Dosing Inpatient   Does not apply q1600   Continuous Infusions:  heparin 1,900 Units/hr (03/10/23 1025)   levETIRAcetam Stopped (03/10/23 1010)   And   levETIRAcetam Stopped (03/09/23 2301)     LOS: 7 days   Hughie Closs, MD [facility]  03/10/2023, 11:22 AM   *Please note that this is a verbal dictation therefore any spelling or grammatical errors are due to the "Dragon Medical One" system interpretation.  Please page via Amion and do not message via secure chat for urgent patient care matters. Secure chat can be used for non urgent patient care matters.  How to contact the [facility] Attending or Consulting provider 7A - 7P or covering provider during after hours 7P -7A, for this patient?  Check the care team in [facility] and look for a) attending/consulting [facility] provider listed and b) the [facility] team listed. Page or secure chat 7A-7P. Log into [email or web address] and use [facility] universal password to access. If you do not have the password, please contact the hospital operator. Locate the [facility] provider you are looking for under [facility] and page to a number that you can be directly  reached. If you still have difficulty reaching the provider, please page the [facility] (Director on Call) for the Hospitalists listed on amion for assistance.

## 2023-03-10 NOTE — Progress Notes (Addendum)
Progress Note  Patient Name: Alan Willis Date of Encounter: 03/10/2023  Primary Cardiologist: None   Subjective   Patient seen and examined at his bedside.   Inpatient Medications    Scheduled Meds:  sodium chloride   Intravenous Once   Chlorhexidine Gluconate Cloth  6 each Topical Daily   dofetilide  250 mcg Oral BID   feeding supplement  1 Container Oral TID BM   furosemide  40 mg Oral BID   lidocaine  1 Application Topical Once   metoprolol tartrate  25 mg Oral BID   pantoprazole (PROTONIX) IV  40 mg Intravenous Q12H   phosphorus  500 mg Oral BID   warfarin  4.5 mg Oral ONCE-1600   Warfarin - Pharmacist Dosing Inpatient   Does not apply q1600   Continuous Infusions:  heparin 1,900 Units/hr (03/10/23 0558)   levETIRAcetam Stopped (03/09/23 1209)   And   levETIRAcetam Stopped (03/09/23 2301)   PRN Meds: acetaminophen, acetaminophen, alum & mag hydroxide-simeth, bisacodyl, fentaNYL (SUBLIMAZE) injection, haloperidol lactate, magic mouthwash, menthol-cetylpyridinium, methocarbamol (ROBAXIN) injection, naphazoline-glycerin, mouth rinse, phenol, sodium chloride   Vital Signs    Vitals:   03/10/23 0300 03/10/23 0344 03/10/23 0400 03/10/23 0500  BP: 111/67  95/68 113/72  Pulse: (!) 105  (!) 117 (!) 53  Resp: 20  (!) 24 19  Temp:  98.2 F (36.8 C)    TempSrc:  Oral    SpO2: 99%  97% 97%  Weight:      Height:        Intake/Output Summary (Last 24 hours) at 03/10/2023 0818 Last data filed at 03/10/2023 0813 Gross per 24 hour  Intake 2604.13 ml  Output 2500 ml  Net 104.13 ml   Filed Weights   03/03/23 0813 03/03/23 1320  Weight: 79.4 kg 79.3 kg    Telemetry    Atrial ftutter  - Personally Reviewed  ECG    Atrial flutter - Personally Reviewed  Physical Exam     General: Comfortable Head: Atraumatic, normal size  Eyes: PEERLA, EOMI  Neck: Supple, normal JVD Cardiac: Normal S1, S2; RRR; no murmurs, rubs, or gallops Lungs: Clear to  auscultation bilaterally Abd: Soft, nontender, no hepatomegaly  Ext: warm, no edema Musculoskeletal: No deformities, BUE and BLE strength normal and equal Skin: Warm and dry, no rashes   Neuro: Alert and oriented to person, place, time, and situation, CNII-XII grossly intact, no focal deficits  Psych: Normal mood and affect   Labs    Chemistry Recent Labs  Lab 03/03/23 0843 03/03/23 1404 03/04/23 0242 03/05/23 0054 03/08/23 0246 03/09/23 0310 03/10/23 0301  NA 127*   < > 125*   < > 132* 134* 130*  K 4.3   < > 4.4   < > 3.7 3.7 3.8  CL 82*   < > 90*   < > 101 101 101  CO2 31   < > 28   < > 23 23 21*  GLUCOSE 136*   < > 151*   < > 118* 107* 102*  BUN 42*   < > 46*   < > 15 17 17   CREATININE 1.81*   < > 1.55*   < > 1.10 1.14 0.96  CALCIUM 9.6   < > 8.1*   < > 7.7* 7.8* 8.0*  PROT 8.0  --  5.6*  --   --   --   --   ALBUMIN 4.8  --  3.6  --   --   --   --  AST 26  --  26  --   --   --   --   ALT 13  --  13  --   --   --   --   ALKPHOS 52  --  30*  --   --   --   --   BILITOT 2.5*  --  2.3*  --   --   --   --   GFRNONAA 38*   < > 46*   < > >60 >60 >60  ANIONGAP 14   < > 7   < > 8 10 8    < > = values in this interval not displayed.     Hematology Recent Labs  Lab 03/08/23 0246 03/09/23 0310 03/10/23 0301  WBC 12.0* 12.3* 11.3*  RBC 2.51* 2.79* 2.70*  HGB 8.0* 8.8* 8.3*  HCT 23.5* 26.8* 26.1*  MCV 93.6 96.1 96.7  MCH 31.9 31.5 30.7  MCHC 34.0 32.8 31.8  RDW 14.0 13.9 13.7  PLT 141* 150 173    Cardiac EnzymesNo results for input(s): "TROPONINI" in the last 168 hours. No results for input(s): "TROPIPOC" in the last 168 hours.   BNP Recent Labs  Lab 03/07/23 1140 03/09/23 0310  BNP 1,402.9* 471.4*     DDimer No results for input(s): "DDIMER" in the last 168 hours.   Radiology    No results found.  Cardiac Studies   Echo reviewed   Patient Profile     77 y.o. male with atrial fibrillation/flutter status post DCCV on dofetilide, s/p mechanical AVR on  coumadin, CKD 2 admitted with GI bleed.   Assessment & Plan    Atrial flutter -he remains in atrial arrhythmia.  His heart rate has gotten better since he was placed back on his metoprolol.  Blood pressure though is marginal we will continue to monitor.  He has been placed back on his dofetilide.  And started back on anticoagulation day of warfarin yesterday, had been on heparin drip-anticoagulation cleared by GI. Plan for TEE/DCCV on tomorrow. Please keep Npo past mid night except meds.  Informed Consent   Shared Decision Making/Informed Consent   The risks [stroke, cardiac arrhythmias rarely resulting in the need for a temporary or permanent pacemaker, skin irritation or burns, esophageal damage, perforation (1:10,000 risk), bleeding, pharyngeal hematoma as well as other potential complications associated with conscious sedation including aspiration, arrhythmia, respiratory failure and death], benefits (treatment guidance, restoration of normal sinus rhythm, diagnostic support) and alternatives of a transesophageal echocardiogram guided cardioversion were discussed in detail with Mr. Pitta and he is willing to proceed.      Hemorrhagic shock-status post EGD noted to have a Mallory-Weiss tear this was treated by hemostatic clips.  He will be on PPI indefinitely.  GI is on board with starting his anticoagulation giving his increased risk of stroke with atrial fibrillation/flutter.   Hypertension-he is only on metoprolol at this time his Aldactone is appropriately held due to marginal blood pressure.   Mechanical aortic valve Moderate to severe mitral regurgitation Heart failure with preserved ejection fraction Recent echo no significant gradient on the aortic valve prosthesis.  However moderate to severe mitral stenosis with mild to moderate mitral regurgitation.  This is an outpatient management. Still with negative output total net output was 2122 mL.  Agree with p.o. Lasix for today.      For questions or updates, please contact [facility] Please consult [email or web address] for contact info under Cardiology/STEMI.      Signed,  Denzel Etienne, DO  03/10/2023, 8:18 AM

## 2023-03-10 NOTE — Plan of Care (Signed)
Problem: Clinical Measurements: Goal: Will remain free from infection Outcome: Progressing Goal: Diagnostic test results will improve Outcome: Progressing Goal: Respiratory complications will improve Outcome: Progressing Goal: Cardiovascular complication will be avoided Outcome: Progressing   Problem: Nutrition: Goal: Adequate nutrition will be maintained Outcome: Progressing   Problem: Elimination: Goal: Will not experience complications related to bowel motility Outcome: Progressing Goal: Will not experience complications related to urinary retention Outcome: Progressing   Problem: Safety: Goal: Ability to remain free from injury will improve Outcome: Progressing

## 2023-03-10 NOTE — Progress Notes (Signed)
PHARMACY - ANTICOAGULATION CONSULT NOTE  Pharmacy Consult for heparin >> warfarin bridge  Indication:  atrial fibrillation, mechanical AVR  Allergies  Allergen Reactions   Amoxicillin-Pot Clavulanate Rash   Codeine Other (See Comments)    Stomach upset    Patient Measurements: Height: 5\' 10"  (177.8 cm) Weight: 79.3 kg (174 lb 13.2 oz) IBW/kg (Calculated) : 73 Heparin Dosing Weight: TBW  Vital Signs: Temp: 98.2 F (36.8 C) (11/24 0344) Temp Source: Oral (11/24 0344) BP: 113/72 (11/24 0500) Pulse Rate: 53 (11/24 0500)  Labs: Recent Labs    03/08/23 0246 03/08/23 1050 03/08/23 1227 03/08/23 2058 03/09/23 0310 03/10/23 0301  HGB 8.0*  --   --   --  8.8* 8.3*  HCT 23.5*  --   --   --  26.8* 26.1*  PLT 141*  --   --   --  150 173  APTT  --  177*  --   --   --   --   LABPROT 15.0  --   --   --  14.6 14.7  INR 1.2  --   --   --  1.1 1.1  HEPARINUNFRC 0.23*  --    < > 0.37 0.50 0.53  CREATININE 1.10  --   --   --  1.14 0.96   < > = values in this interval not displayed.    Estimated Creatinine Clearance: 67.6 mL/min (by C-G formula based on SCr of 0.96 mg/dL).   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   Stricture of sigmoid colon s/p colectomy 2007 2007    Medications: Infusions:   heparin 1,900 Units/hr (03/10/23 0558)   levETIRAcetam Stopped (03/09/23 1209)   And   levETIRAcetam Stopped (03/09/23 2301)     Assessment: 77 YO male presenting with a SBO and hematemesis. Patient has a significant cardiac history of atrial fibrillation (s/p TEE guided DCCV  07/2019) and AVR  [facility] 1983) on chronic warfarin anticoagulation. Last dose of warfarin 11/16 @0800 . INR was 2.9 on admission and increased up to 4.1 on 11/17 -  he was given IV vitamin K 10mg  x1 on 11/18 to get INR <2 for EGD 11/19 which showed 2 large Mallory-Weiss tear with clots--3 endoclips have been placed to close mucosal defect, superficial fundic ulcers. Pharmacy consulted for heparin dosing on 11/19 with plan to restart warfarin in 3 days per GI on 11/22. Will give first dose of warfarin today.  PTA warfarin regimen: 6mg  daily except 3mg  on Tuesdays.  (goal INR 2.5-3.5 per outpatient notes)  Today, 03/10/23, PM:  HL is 0.53, therapeutic Hgb 8.3--low, stable, plts 173--stable INR remains subtherapeutic at 1.1 Typically see effect of warfarin dose ~48hrs after the dose is given. INR 1.2>>1.1 48hrs after Friday's dose of 3mg , however, Hgb 8.8>>8.3 from yesterday. Will continue with same dose as yesterday, 4.5mg . Per discussion with cardiology, targeting a new INR goal of 2-3 given recent bleed.  No s/sx of bleeding reported  Goal of Therapy:  INR 2-3 Heparin level 0.3-0.7 units/ml Monitor platelets by anticoagulation protocol: Yes   Plan:  Continue heparin at 1900 units/hr  Give warfarin 4.5mg  x1 tonight  Daily heparin level, INR, CBC Monitor for s/sx of bleeding  Plan for TEE/DCCV on Monday, per cardiology Continue heparin gtt until INR is within goal range on warfarin.    Cherylin Mylar, PharmD Clinical Pharmacist  11/24/20247:10 AM

## 2023-03-10 NOTE — Anesthesia Preprocedure Evaluation (Addendum)
Anesthesia Evaluation    Reviewed: Allergy & Precautions, Patient's Chart, lab work & pertinent test results  Airway        Dental   Pulmonary Current Smoker          Cardiovascular pulmonary hypertension+CHF  + dysrhythmias Atrial Fibrillation + Valvular Problems/Murmurs (mod/severe MS, mild/mod MR, s/p AVR) AS and MR  Rhythm:Irregular  TTE 2024 1. Left ventricular ejection fraction, by estimation, is 40 to 45%. The  left ventricle has mildly decreased function. The left ventricle  demonstrates global hypokinesis. There is moderate left ventricular  hypertrophy of the basal-septal segment. Left  ventricular diastolic function could not be evaluated.   2. Right ventricular systolic function is moderately reduced. The right  ventricular size is mildly enlarged. There is moderately elevated  pulmonary artery systolic pressure. The estimated right ventricular  systolic pressure is 55.6 mmHg.   3. Left atrial size was severely dilated.   4. Right atrial size was severely dilated.   5. The mitral valve is degenerative. Mild to moderate mitral valve  regurgitation. 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 The mean mitral valve  gradient is 8.2 mmHg. Moderate to severe mitral annular calcification.   6. The aortic valve has been repaired/replaced. Aortic valve  regurgitation is not visualized. There is a Bjork-Shiley ball and cage  valve present in the aortic position. Aortic valve mean gradient measures  9.0 mmHg. Aortic valve Vmax measures 2.00 m/s.   7. Aortic dilatation noted. There is mild dilatation of the ascending  aorta, measuring 39 mm.   8. The inferior vena cava is dilated in size with >50% respiratory  variability, suggesting right atrial pressure of 8 mmHg.     Neuro/Psych Seizures -,   negative psych ROS   GI/Hepatic negative GI ROS, Neg liver ROS,,,  Endo/Other  negative endocrine ROS    Renal/GU Renal  InsufficiencyRenal disease  negative genitourinary   Musculoskeletal negative musculoskeletal ROS (+)    Abdominal   Peds  Hematology  (+) Blood dyscrasia (coumadin), anemia   Anesthesia Other Findings 77 y.o. male with atrial fibrillation/flutter, s/p mechanical AVR on coumadin, CKD 2 admitted with GI bleed from [facility] Weiss tear  Reproductive/Obstetrics                             Anesthesia Physical Anesthesia Plan  ASA: 4  Anesthesia Plan: MAC   Post-op Pain Management:    Induction: Intravenous  PONV Risk Score and Plan: Propofol infusion and Treatment may vary due to age or medical condition  Airway Management Planned: Natural Airway  Additional Equipment:   Intra-op Plan:   Post-operative Plan:   Informed Consent: I have reviewed the patients History and Physical, chart, labs and discussed the procedure including the risks, benefits and alternatives for the proposed anesthesia with the patient or authorized representative who has indicated his/her understanding and acceptance.     Dental advisory given  Plan Discussed with: CRNA  Anesthesia Plan Comments:        Anesthesia Quick Evaluation

## 2023-03-11 ENCOUNTER — Encounter (HOSPITAL_COMMUNITY)
Admission: EM | Disposition: A | Discharge status: Home Health | Payer: Self-pay | Source: Home / Self Care | Attending: Family Medicine

## 2023-03-11 ENCOUNTER — Other Ambulatory Visit (HOSPITAL_COMMUNITY): Payer: Medicare Other

## 2023-03-11 DIAGNOSIS — I4892 Unspecified atrial flutter: Secondary | ICD-10-CM | POA: Diagnosis not present

## 2023-03-11 DIAGNOSIS — K920 Hematemesis: Secondary | ICD-10-CM | POA: Diagnosis not present

## 2023-03-11 LAB — BASIC METABOLIC PANEL
Anion gap: 9 (ref 5–15)
BUN: 12 mg/dL (ref 8–23)
CO2: 23 mmol/L (ref 22–32)
Calcium: 8.4 mg/dL — ABNORMAL LOW (ref 8.9–10.3)
Chloride: 100 mmol/L (ref 98–111)
Creatinine, Ser: 1.07 mg/dL (ref 0.61–1.24)
GFR, Estimated: >60 mL/min (ref >60)
Glucose, Bld: 115 mg/dL — ABNORMAL HIGH (ref 70–99)
Potassium: 3.8 mmol/L (ref 3.5–5.1)
Sodium: 132 mmol/L — ABNORMAL LOW (ref 135–145)

## 2023-03-11 LAB — CBC
HCT: 26.4 % — ABNORMAL LOW (ref 39.0–52.0)
Hemoglobin: 8.5 g/dL — ABNORMAL LOW (ref 13.0–17.0)
MCH: 30.7 pg (ref 26.0–34.0)
MCHC: 32.2 g/dL (ref 30.0–36.0)
MCV: 95.3 fL (ref 80.0–100.0)
Platelets: 191 10*3/uL (ref 150–400)
RBC: 2.77 MIL/uL — ABNORMAL LOW (ref 4.22–5.81)
RDW: 13.9 % (ref 11.5–15.5)
WBC: 9 10*3/uL (ref 4.0–10.5)
nRBC: 0 % (ref 0.0–0.2)

## 2023-03-11 LAB — HEMOGLOBIN AND HEMATOCRIT, BLOOD
HCT: 26.2 % — ABNORMAL LOW (ref 39.0–52.0)
Hemoglobin: 8.6 g/dL — ABNORMAL LOW (ref 13.0–17.0)

## 2023-03-11 LAB — CULTURE, BLOOD (ROUTINE X 2)
Culture: NO GROWTH
Culture: NO GROWTH
Special Requests: ADEQUATE

## 2023-03-11 LAB — HEPARIN LEVEL (UNFRACTIONATED)
Heparin Unfractionated: 0.7 [IU]/mL (ref 0.30–0.70)
Heparin Unfractionated: 0.66 [IU]/mL (ref 0.30–0.70)

## 2023-03-11 LAB — MAGNESIUM: Magnesium: 1.9 mg/dL (ref 1.7–2.4)

## 2023-03-11 LAB — PROTIME-INR
INR: 1.2 (ref 0.8–1.2)
Prothrombin Time: 15.4 s — ABNORMAL HIGH (ref 11.4–15.2)

## 2023-03-11 LAB — PREALBUMIN: Prealbumin: 16 mg/dL — ABNORMAL LOW (ref 18–38)

## 2023-03-11 SURGERY — TRANSESOPHAGEAL ECHOCARDIOGRAM (TEE) (CATHLAB)
Anesthesia: Monitor Anesthesia Care

## 2023-03-11 MED ORDER — SODIUM CHLORIDE 0.9 % IV SOLN
INTRAVENOUS | Status: DC
Start: 2023-03-11 — End: 2023-03-11

## 2023-03-11 MED ORDER — POTASSIUM CHLORIDE 10 MEQ/100ML IV SOLN
10.0000 meq | INTRAVENOUS | Status: AC
  Administered 2023-03-11 (×4): 10 meq via INTRAVENOUS
  Filled 2023-03-11 (×4): qty 100

## 2023-03-11 MED ORDER — MAGNESIUM SULFATE 2 GM/50ML IV SOLN
2.0000 g | Freq: Once | INTRAVENOUS | Status: AC
  Administered 2023-03-11: 2 g via INTRAVENOUS
  Filled 2023-03-11: qty 50

## 2023-03-11 MED ORDER — WARFARIN SODIUM 5 MG PO TABS
5.0000 mg | ORAL_TABLET | Freq: Once | ORAL | Status: AC
  Administered 2023-03-11: 5 mg via ORAL
  Filled 2023-03-11: qty 1

## 2023-03-11 NOTE — Progress Notes (Signed)
Occupational Therapy Treatment Patient Details Name: Alan Willis MRN: [id number] DOB: 1946-01-27 Today's Date: 03/11/2023   History of present illness 77 year old male with a past medical history atrial fibrillation, history of ablation, mitral regurgitation, mitral valve stenosis, aortic valve replacement on warfarin who presented to the emergency department 03/03/23  with complaints of melena, hematemesis, abdominal distention, abdominal pain with workup showing SBO, AKI and positive occult blood in stool,Hemorrhagic shock secondary to acute blood loss anemia secondary to upper GI bleed, new small left pleural effusion, requiring increased Oxygen.   OT comments  Patient was able to engage in grooming tasks and simulated ADLs with MI on this date. Patients HR noted to get up to 133 bpm at end of session with nurse in room at this time. Patient endorsed being at baseline for ADLs at this time and not needing OT further. OT to sign off at this time.       If plan is discharge home, recommend the following:  Assist for transportation;Assistance with cooking/housework;Help with stairs or ramp for entrance   Equipment Recommendations  None recommended by OT       Precautions / Restrictions Precautions Precautions: Fall Precaution Comments: monitor sats, HR, BP Restrictions Weight Bearing Restrictions: No       Mobility Bed Mobility Overal bed mobility: Modified Independent                           ADL either performed or assessed with clinical judgement   ADL Overall ADL's : At baseline           General ADL Comments: patient is able to participate in grooming tasks standing at sink with no A. patient was able to participate in functional mobility in hallway with no LOB with IV pole around entire unti of ICU. patient was able to complete bed mobility with MI. patient endorsed not needing OT services further at this time. patietn was educated on ECT. patient  verbalized understanding.      Cognition Arousal: Alert Behavior During Therapy: WFL for tasks assessed/performed Overall Cognitive Status: Within Functional Limits for tasks assessed                                                General Comments HR up to 133 bpm standing at sink to brush teeth after walking full lap around ICU hall    Pertinent Vitals/ Pain       Pain Assessment Pain Assessment: No/denies pain         Frequency           Progress Toward Goals  OT Goals(current goals can now be found in the care plan section)  Progress towards OT goals: Goals met/education completed, patient discharged from OT     Plan         AM-PAC OT "6 Clicks" Daily Activity     Outcome Measure   Help from another person eating meals?: None Help from another person taking care of personal grooming?: None Help from another person toileting, which includes using toliet, bedpan, or urinal?: None Help from another person bathing (including washing, rinsing, drying)?: None Help from another person to put on and taking off regular upper body clothing?: None Help from another person to put on and taking off regular lower body clothing?: None 6 Click  Score: 24    End of Session    OT Visit Diagnosis: Muscle weakness (generalized) (M62.81);Unsteadiness on feet (R26.81)   Activity Tolerance Patient tolerated treatment well   Patient Left in bed;with call bell/phone within reach;with bed alarm set;with nursing/sitter in room   Nurse Communication Mobility status        Time: 0981-1914 OT Time Calculation (min): 16 min  Charges: OT General Charges $OT Visit: 1 Visit OT Treatments $Self Care/Home Management : 8-22 mins  Rosalio Loud, MS Acute Rehabilitation Department Office# [phone]   Selinda Flavin 03/11/2023, 2:51 PM

## 2023-03-11 NOTE — Progress Notes (Signed)
Spoke with [facility], RN that is currently taking care of the patient.  She is aware patient will need an 18g IV prior to [facility] transfer to [facility] for Cardiac Pulmonary Vein CT. C  [facility] called for 11am pickup and to arrive at [facility] at 11:30 AM for November 26.

## 2023-03-11 NOTE — Plan of Care (Signed)
  Problem: Nutrition: Goal: Adequate nutrition will be maintained Outcome: Progressing   Problem: Pain Management: Goal: General experience of comfort will improve Outcome: Progressing   Problem: Skin Integrity: Goal: Risk for impaired skin integrity will decrease Outcome: Progressing

## 2023-03-11 NOTE — Progress Notes (Signed)
PROGRESS NOTE    Alan Willis  ZOX:[id number] DOB: 27-Apr-1945 DOA: 03/03/2023 PCP: [name], MD   Brief Narrative:  77 yo M with mech AV on warfarin, p/w GI bleed, SBO, AKI and Afib RVR.   On admission required pressors due to hemorrhagic shock and Afib.  Transferred to ICU and required vasopressors. EGD on 11/19 showed bleeding likely from [facility] tear. Heparin resumed. AKI resolved. Cards, General surgery and GI on board.  Transferred to [facility] 03/06/2023.  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  SBO appears to have resolved.  Tube removed 03/05/2023, SBO resolved.  Tolerating regular diet.  Hemorrhagic shock secondary to acute blood loss anemia secondary to upper GI bleed/Mallory-Weiss tear: Status post 3 units of PRBC transfusion, last on 03/05/2023 and 3 units of frozen plasma transfusion, last on 03/04/2023, status post EGD 11/19 with two large non-bleeding Mallory-Weiss tear with stigmata of recent bleeding and two large clots at site s/p clips x 3. Also two nonbleeding lineal and superficial gastric ulcers with a clean ulcer base in gastric fundus. S/p biopsy from duodenal bulb for severe mucosal changes with erythema, inflammation and nodularity.  GI recommends PPI BID x 2 months followed by daily.  After clearance from GI, patient was started on heparin on 03/05/2023 and GI recommended  to resume Coumadin in 3 days so that was resumed on 03/08/2023.  Hemoglobin stable.  Continue heparin bridging until INR becomes therapeutic with goal to keep between 2-3.  Pharmacy managing Coumadin dose. Morning nurse reported that patient was noted to have some frank hematuria around 4 AM.  CBC which was done prior to that appears to have stable hemoglobin.  Patient is not too confused to me.  He said that his urine was clear so it is not clear yet whether he did have hematuria or not.  He appears stable.  Plan is to continue heparin and repeat hemoglobin now, if any evidence of drop in hemoglobin or further episodes of hematuria, will consider holding both heparin and Coumadin.  History of mechanical aortic valve replacement on chronic anticoagulation with Coumadin/supratherapeutic INR: Continue Coumadin with bridging heparin with a goal INR of 2-3 per cardiology recommendation.  Permanent atrial fibrillation with RVR on admission: On Tikosyn PTA, was unable to take for several days prior to admission.  Resumed Tikosyn.  Goes in and out of rapid A-fib.  Has been started on low-dose of metoprolol but that is on hold due to low blood pressure.  Cardiology managing.  Currently in sinus rhythm.  Noted plan for TEE/cardioversion today-canceled due to episode of hematuria, patient has been placed back on the diet.  Fever: Patient has been having persistent low-grade fever since afternoon of 03/05/2023.  Patient has no symptoms suggesting of any particular infection.  Due to history of mechanical valve replacement, will check blood culture, procalcitonin and lactic  acid.  Currently afebrile.  Monitor closely.  Acute hypoxic respiratory failure secondary to acute on chronic congestive heart failure with preserved ejection fraction: Was requiring 10 L oxygen on the morning of 03/07/2023.  Started on IV Lasix.  Significantly improved, weaned down to room air on the morning of 03/09/2023.  Had significant  diuresis.  Has been transitioned to oral Lasix.  Delirium/seizures/memory issues: Continue Keppra, seizure precautions, aspiration precautions.  Currently fully alert and oriented.  I turned on all the lights in his room and advised his wife as well as primary RN at the bedside to do so during the daytime to avoid delirium. 1. Avoid benzodiazepines, antihistamines, anticholinergics, and minimize opiate use as these may worsen delirium. 2: Assess, prevent and manage pain as lack of treatment can result in delirium.  3: Provide appropriate lighting and clear signage; a clock and calendar should be easily visible to the patient. 4: Monitor environmental factors. Reduce light and noise at night (close shades, turn off lights, turn off TV, ect). Correct any alterations in sleep cycle. 5: Reorient the patient to person, place, time and situation on each encounter.  6: Correct sensory deficits if possible (replace eye glasses, hearing aids, ect). 7: Avoid restraints if able. Severely delirious patients benefit from constant observation by a sitter.  Hypophosphatemia: resolved.  Acute hyponatremia: Likely hypovolemic.  Presented with 127, went as low as 125 but currently 134.  DVT prophylaxis: SCDs Start: 03/03/23 1402 heparin   Code Status: Full Code  Family Communication: Son present at bedside.  Plan of care discussed with patient and his wife in length and he/she verbalized understanding and agreed with it.  Status is: Inpatient Remains inpatient appropriate because: Patient with multiple active issues being managed and needs to be in the hospital.   Estimated body mass index is 25.08 kg/m as calculated from the following:   Height as of this encounter: 5\' 10"  (1.778 m).   Weight as of this encounter: 79.3 kg.    Nutritional Assessment: Body mass index is 25.08 kg/m.Marland Kitchen Seen by dietician.  I agree with the assessment and plan as outlined below: Nutrition Status:        . Skin  Assessment: I have examined the patient's skin and I agree with the wound assessment as performed by the wound care RN as outlined below:    Consultants:  GI, general surgery and cardiology  Procedures:  As above  Antimicrobials:  Anti-infectives (From admission, onward)    None         Subjective: Patient seen and examined.  This morning his wife was not at the bedside.  Patient was waking up from the sleep, I did not note much of her confusion, to me it sounded normal for a person of his age who is still trying to wake up in the morning.  Later on, received a message from the nurse that the wife showed up at the bedside and wanted me to talk to her at the bedside and answer some questions.  I went back.  Her major question was, why is patient's cardioversion is canceled and if patient can go home.  This was surprised to me because I was not aware of cancellation of the cardioversion until she told me.  I referred her to discuss this with cardiology directly.  She had no further questions for me.  Objective: Vitals:   03/11/23 0400 03/11/23 0700 03/11/23 0800 03/11/23 0922  BP:  111/75 101/72 (!) 92/58  Pulse: Marland Kitchen)  107 (!) 109 (!) 104 (!) 103  Resp: 16 (!) 21 17 16   Temp:    98.5 F (36.9 C)  TempSrc:    Oral  SpO2: 99% 99% 99% 100%  Weight:      Height:        Intake/Output Summary (Last 24 hours) at 03/11/2023 1018 Last data filed at 03/11/2023 0946 Gross per 24 hour  Intake 1622.82 ml  Output 2750 ml  Net -1127.18 ml   Filed Weights   03/03/23 0813 03/03/23 1320  Weight: 79.4 kg 79.3 kg    Examination:  General exam: Appears calm and comfortable  Respiratory system: Clear to auscultation. Respiratory effort normal. Cardiovascular system: S1 & S2 heard, RRR. No JVD, murmurs, rubs, gallops or clicks. No pedal edema. Gastrointestinal system: Abdomen is nondistended, soft and nontender. No organomegaly or masses felt. Normal bowel sounds heard. Central nervous  system: Alert and oriented. No focal neurological deficits. Extremities: Symmetric 5 x 5 power. Skin: No rashes, lesions or ulcers.  Psychiatry: Judgement and insight appear normal. Mood & affect appropriate.   Data Reviewed: I have personally reviewed following labs and imaging studies  CBC: Recent Labs  Lab 03/07/23 0127 03/08/23 0246 03/09/23 0310 03/10/23 0301 03/11/23 0325  WBC 15.3* 12.0* 12.3* 11.3* 9.0  HGB 8.2* 8.0* 8.8* 8.3* 8.5*  HCT 24.6* 23.5* 26.8* 26.1* 26.4*  MCV 94.6 93.6 96.1 96.7 95.3  PLT 117* 141* 150 173 191   Basic Metabolic Panel: Recent Labs  Lab 03/06/23 0258 03/07/23 0127 03/07/23 1140 03/08/23 0246 03/09/23 0310 03/10/23 0301 03/11/23 0325  NA 133* 128*  --  132* 134* 130* 132*  K 3.5 3.7  --  3.7 3.7 3.8 3.8  CL 103 98  --  101 101 101 100  CO2 20* 21*  --  23 23 21* 23  GLUCOSE 94 128*  --  118* 107* 102* 115*  BUN 19 13  --  15 17 17 12   CREATININE 1.16 1.02  --  1.10 1.14 0.96 1.07  CALCIUM 7.9* 7.9*  --  7.7* 7.8* 8.0* 8.4*  MG 2.1 1.9  --  2.3 2.1 2.0 1.9  PHOS 1.7*  --  1.6*  --  2.6  --   --    GFR: Estimated Creatinine Clearance: 60.6 mL/min (by C-G formula based on SCr of 1.07 mg/dL). Liver Function Tests: No results for input(s): "AST", "ALT", "ALKPHOS", "BILITOT", "PROT", "ALBUMIN" in the last 168 hours.  No results for input(s): "LIPASE", "AMYLASE" in the last 168 hours.  No results for input(s): "AMMONIA" in the last 168 hours. Coagulation Profile: Recent Labs  Lab 03/07/23 0127 03/08/23 0246 03/09/23 0310 03/10/23 0301 03/11/23 0325  INR 1.1 1.2 1.1 1.1 1.2   Cardiac Enzymes: No results for input(s): "CKTOTAL", "CKMB", "CKMBINDEX", "TROPONINI" in the last 168 hours. BNP (last 3 results) No results for input(s): "PROBNP" in the last 8760 hours. HbA1C: No results for input(s): "HGBA1C" in the last 72 hours. CBG: No results for input(s): "GLUCAP" in the last 168 hours. Lipid Profile: No results for input(s):  "CHOL", "HDL", "LDLCALC", "TRIG", "CHOLHDL", "LDLDIRECT" in the last 72 hours. Thyroid Function Tests: No results for input(s): "TSH", "T4TOTAL", "FREET4", "T3FREE", "THYROIDAB" in the last 72 hours. Anemia Panel: No results for input(s): "VITAMINB12", "FOLATE", "FERRITIN", "TIBC", "IRON", "RETICCTPCT" in the last 72 hours. Sepsis Labs: Recent Labs  Lab 03/06/23 1052  PROCALCITON 0.20  LATICACIDVEN 1.0    Recent Results (from the past 240 hour(s))  MRSA Next  Gen by PCR, Nasal     Status: None   Collection Time: 03/04/23  2:30 AM   Specimen: Nasal Mucosa; Nasal Swab  Result Value Ref Range Status   MRSA by PCR Next Gen NOT DETECTED NOT DETECTED Final    Comment: (NOTE) The GeneXpert MRSA Assay (FDA approved for NASAL specimens only), is one component of a comprehensive MRSA colonization surveillance program. It is not intended to diagnose MRSA infection nor to guide or monitor treatment for MRSA infections. Test performance is not FDA approved in patients less than 10 years old. Performed at [facility], [address] [place], [place] [address]   Culture, blood (Routine X 2) w Reflex to ID Panel     Status: None   Collection Time: 03/06/23 10:52 AM   Specimen: BLOOD LEFT ARM  Result Value Ref Range Status   Specimen Description   Final    BLOOD LEFT ARM Performed at [facility], [address] [place], [place] [address]    Special Requests   Final    BOTTLES DRAWN AEROBIC AND ANAEROBIC Blood Culture adequate volume Performed at [facility], [address] [place], [place] [address]    Culture   Final    NO GROWTH 5 DAYS Performed at [facility], [address], [place], [place] [address]    Report Status 03/11/2023 FINAL  Final  Culture, blood (Routine X 2) w Reflex to ID Panel     Status: None   Collection Time: 03/06/23 11:08 AM   Specimen: BLOOD  Result Value Ref Range Status   Specimen Description   Final     BLOOD SITE NOT SPECIFIED Performed at [facility], [address] [place], [place] [address]    Special Requests   Final    BOTTLES DRAWN AEROBIC ONLY Blood Culture results may not be optimal due to an inadequate volume of blood received in culture bottles Performed at [facility], [address], [place], [place] [address]    Culture   Final    NO GROWTH 5 DAYS Performed at [facility], [address] [place], [place] [address]    Report Status 03/11/2023 FINAL  Final     Radiology Studies: No results found.  Scheduled Meds:  sodium chloride   Intravenous Once   Chlorhexidine Gluconate Cloth  6 each Topical Daily   dofetilide  250 mcg Oral BID   feeding supplement  1 Container Oral TID BM   furosemide  40 mg Oral BID   lidocaine  1 Application Topical Once   metoprolol tartrate  25 mg Oral BID   pantoprazole (PROTONIX) IV  40 mg Intravenous Q12H   phosphorus  500 mg Oral BID   Warfarin - Pharmacist Dosing Inpatient   Does not apply q1600   Continuous Infusions:  heparin 1,800 Units/hr (03/11/23 9629)   levETIRAcetam 500 mg (03/11/23 0945)   And   levETIRAcetam Stopped (03/10/23 2307)   magnesium sulfate bolus IVPB     potassium chloride       LOS: 8 days   Hughie Closs, MD [facility]  03/11/2023, 10:18 AM   *Please note that this is a verbal dictation therefore any spelling or grammatical errors are due to the "Dragon Medical One" system interpretation.  Please page via Amion and do not message via secure chat for urgent patient care matters. Secure chat can be used for non urgent patient care  matters.  How to contact the [facility] Attending or Consulting provider 7A - 7P or covering provider during after hours 7P -7A, for this patient?  Check the care team in [facility] and look for a) attending/consulting [facility] provider listed and b) the [facility] team listed. Page or secure chat 7A-7P. Log into [email or web address] and use [facility]'s universal password to access. If you do not have the password, please contact the hospital operator. Locate the [facility] provider you are looking for under [facility] and page to a number that you can be directly reached. If you still have difficulty reaching the provider, please page the [facility] (Director on Call) for the Hospitalists listed on amion for assistance.

## 2023-03-11 NOTE — Progress Notes (Signed)
PHARMACY - ANTICOAGULATION CONSULT NOTE  Pharmacy Consult for heparin >> warfarin bridge  Indication:  atrial fibrillation, mechanical AVR  Allergies  Allergen Reactions   Amoxicillin-Pot Clavulanate Rash   Codeine Other (See Comments)    Stomach upset    Patient Measurements: Height: 5\' 10"  (177.8 cm) Weight: 79.3 kg (174 lb 13.2 oz) IBW/kg (Calculated) : 73 Heparin Dosing Weight: TBW  Vital Signs: Temp: 98.5 F (36.9 C) (11/25 0922) Temp Source: Oral (11/25 0922) BP: 97/67 (11/25 1100) Pulse Rate: 103 (11/25 1137)  Labs: Recent Labs    03/09/23 0310 03/10/23 0301 03/11/23 0325  HGB 8.8* 8.3* 8.5*  HCT 26.8* 26.1* 26.4*  PLT 150 173 191  LABPROT 14.6 14.7 15.4*  INR 1.1 1.1 1.2  HEPARINUNFRC 0.50 0.53 0.70  CREATININE 1.14 0.96 1.07    Estimated Creatinine Clearance: 60.6 mL/min (by C-G formula based on SCr of 1.07 mg/dL).   Medical History: Past Medical History:  Diagnosis Date   A-fib [facility])    Atrial flutter with rapid ventricular response (HCC) 03/16/2021   Cholelithiasis 2011   Chronic anticoagulation 03/03/2023   Colon, diverticulosis    H/O heart valve replacement with mechanical valve 03/03/2023   Bjork-Shiley tilting disc valve in 1983 at [facility] in [place], [place].        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   Stricture of sigmoid colon s/p colectomy 2007 2007    Medications: Infusions:   heparin 1,800 Units/hr (03/11/23 1146)   levETIRAcetam Stopped (03/11/23 1000)   And   levETIRAcetam Stopped (03/10/23 2307)   potassium chloride       Assessment: 77 YO male presenting with a SBO and hematemesis. Patient has a significant cardiac history of atrial fibrillation (s/p TEE guided DCCV 07/2019) and AVR  [facility] 1983) on chronic warfarin anticoagulation. Last dose of warfarin 11/16 @0800 . INR was 2.9 on admission and increased up to 4.1 on 11/17 -  he was given IV vitamin K 10mg  x1 on 11/18 to get  INR <2 for EGD 11/19 which showed 2 large Mallory-Weiss tear with clots--3 endoclips have been placed to close mucosal defect, superficial fundic ulcers. Pharmacy consulted for heparin dosing on 11/19 with plan to restart warfarin in 3 days per GI on 11/22. Will give first dose of warfarin today.  PTA warfarin regimen: 6mg  daily except 3mg  on Tuesdays.  (goal INR 2.5-3.5 per outpatient notes)  Today, 03/11/23, PM:  HL is 0.70, therapeutic but at higher end  Hgb 8.5--low, stable, plts 191--stable INR remains subtherapeutic at 1.2 Per discussion with cardiology, targeting a new INR goal of 2-3 given recent bleed.  Concern for hematuria this AM. Messaged Dr. Jacqulyn Bath to clarify if meds should be continued. MD states continue both warfarin and heparin In light of bleeding concerns, TEE scheduled for 11/25 cancelled  Goal of Therapy:  INR 2-3 Heparin level 0.3-0.7 units/ml Monitor platelets by anticoagulation protocol: Yes   Plan:  Decrease  heparin to 1800 units/hr  Give warfarin 5mg  x1 tonight  Daily heparin level, INR, CBC Monitor for s/sx of bleeding  Continue heparin gtt until INR is within goal range on warfarin.     Adalberto Cole, PharmD, BCPS 03/11/2023 11:47 AM

## 2023-03-11 NOTE — Progress Notes (Addendum)
Patient Name: Alan Willis Date of Encounter: 03/11/2023 [facility] Cardiologist: None [facility]   Interval Summary  .    Patient is confused this morning does not know why he is in the hospital however is pleasant and without any complaints shortness of breath or chest pain.  Still in atrial flutter.  Had an episode of frank hematuria with 450 cc output this morning around 6 AM reported by previous night shift nurse.  Vital Signs .    Vitals:   03/11/23 0000 03/11/23 0315 03/11/23 0400 03/11/23 0700  BP: 112/78   111/75  Pulse: (!) 105  (!) 107 (!) 109  Resp: 19  16 (!) 21  Temp:  98 F (36.7 C)    TempSrc:  Axillary    SpO2: 100%  99% 99%  Weight:      Height:        Intake/Output Summary (Last 24 hours) at 03/11/2023 0801 Last data filed at 03/11/2023 0756 Gross per 24 hour  Intake 1382.82 ml  Output 3250 ml  Net -1867.18 ml      03/03/2023    1:20 PM 03/03/2023    8:13 AM 12/19/2022    7:55 AM  Last 3 Weights  Weight (lbs) 174 lb 13.2 oz 175 lb 176 lb 12.8 oz  Weight (kg) 79.3 kg 79.379 kg 80.196 kg      Telemetry/ECG    Atrial flutter heart rate 100-110- Personally Reviewed  CV Studies    Echocardiogram 03/07/2023  1. Left ventricular ejection fraction, by estimation, is 40 to 45%. The  left ventricle has mildly decreased function. The left ventricle  demonstrates global hypokinesis. There is moderate left ventricular  hypertrophy of the basal-septal segment. Left  ventricular diastolic function could not be evaluated.   2. Right ventricular systolic function is moderately reduced. The right  ventricular size is mildly enlarged. There is moderately elevated  pulmonary artery systolic pressure. The estimated right ventricular  systolic pressure is 55.6 mmHg.   3. Left atrial size was severely dilated.   4. Right atrial size was severely dilated.   5. The mitral valve is degenerative. Mild to moderate mitral valve  regurgitation. Moderate  to severe mitral stenosis. The mean mitral valve  gradient is 8.2 mmHg. Moderate to severe mitral annular calcification.   6. The aortic valve has been repaired/replaced. Aortic valve  regurgitation is not visualized. There is a Bjork-Shiley ball and cage  valve present in the aortic position. Aortic valve mean gradient measures  9.0 mmHg. Aortic valve Vmax measures 2.00 m/s.   7. Aortic dilatation noted. There is mild dilatation of the ascending  aorta, measuring 39 mm.   8. The inferior vena cava is dilated in size with >50% respiratory  variability, suggesting right atrial pressure of 8 mmHg.   Physical Exam .   GEN: No acute distress.   Neck: No JVD Cardiac: RRR, no murmurs, rubs, or gallops.  Tachycardic Respiratory: Clear to auscultation bilaterally. GI: Soft, nontender, non-distended  MS: No edema  Patient Profile    Alan Willis is a 77 y.o. 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 Cardiology was consulted on 03/04/2023 for further evaluation of atrial flutter with RVR.    Assessment & Plan .     Paroxysmal atrial fibrillation/flutter Followed by [facility] status post multiple DCCV's in the past and chronically been managed on Tikosyn.  Last DCCV was in June 2023. Tikosyn was held briefly due to inability to tolerate p.o., restarted 03/05/2023.  Rate is between 100-110. Severe dilatation of both atria.  Plan today was for TEE/DCCV.  Unfortunately had an episode of frank hematuria 450 cc reported by night nurse yesterday.  Will cancel procedure  today.  Discussed with primary MD.  Molli Knock to continue heparin and repeat labs and will discontinue if he has repeat episode.  Hemoglobin has been stable, 8.4 today, but drawn 3 hours prior to episode.  Continue Tikosyn 250 mcg twice daily, metoprolol 25 mg twice daily Warfarin per pharmacy, currently subtherapeutic and bridging with heparin.  He has been on warfarin 4.5 mg since 03/08/2023.  INR's are likely low due to vitamin K being administered on 03/04/2023.  INR goal is 2-3 Prior notes indicate possible plans for ablation if recurrence.  Acute on chronic HFpEF Initially had significant oxygen requirements and was volume up secondary to fluid resuscitation for hemorrhagic shock.  He was diuresed and now looks euvolemic, now back to p.o. meds.  Echo this admission shows newly reduced EF 40 to 45% with global hypokinesis with moderate LVH, moderately reduced RV function, RVSP 55.6.  Likely related to RVR.  Will need repeat echocardiogram when in sinus. Pressures are little soft this morning, okay to hold p.o. Lasix 40 mg twice daily.  Hold PTA spironolactone.    History of mechanical AVR 1983 (Bjork-Shiley ball and cage valve) MR/MS Aortic mean gradient 9, stable. 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  Mean gradient 8.2.  Previously reported as mild on April 2024 echo.  Acute on chronic CKD Peaked at 1.81 in the setting of hemorrhagic shock.  Now normalized.  Mild dilatation of ascending aorta (39 mm)  Hemorrhagic shock secondary to Mallory-Weiss tear SBO S/p 3 units PRBC, hemostatic clips. He will be on PPI indefinitely.  EGD also revealed nonbleeding internal superficial gastric ulcers.  Biopsy of the duodenal bulb showed severe mucosal changes with erythema, inflammation, nodularity.  For questions or updates, please contact [facility] Please consult [email or web address] for contact info under     Signed, Abagail Kitchens, PA-C    As above, patient seen and examined.  He denies dyspnea  or chest pain.  He remains in atrial flutter with heart rate averaging approximately 100-110.  He had been scheduled for a TEE guided cardioversion today but apparently had question hematuria last evening.  Hemoglobin is unchanged.  Will continue anticoagulation and follow hemoglobin closely.  Will also follow for recurrent hematuria.  Would like to avoid proceeding with cardioversion if he is actively bleeding (he will require 4 weeks of uninterrupted anticoagulation following cardioversion or risk of embolic event is higher).  Also note would like to avoid transesophageal echocardiogram given recent GI bleed including Mallory-Weiss tears requiring clipping.  Will arrange CTA to rule out left atrial appendage thrombus.  If negative will plan cardioversion later this week. Olga Millers, MD

## 2023-03-11 NOTE — Progress Notes (Signed)
Per nightshift RN, Patient had 450 mLs of frank hematuria right before shift change. A slight amount of red-tinged urine can be seen in his purewick canister. Cardiology team and triad MD notified by this RN. Per cards verbal instruction, heparin gtt stopped by this RN. Cards calling GI team to determine further orders at this time.

## 2023-03-11 NOTE — Progress Notes (Signed)
Physical Therapy Treatment Patient Details Name: Alan Willis MRN: [id number] DOB: 10-17-1945 Today's Date: 03/11/2023   History of Present Illness 77 year old male with a past medical history atrial fibrillation, history of ablation, mitral regurgitation, mitral valve stenosis, aortic valve replacement on warfarin who presented to the emergency department 03/03/23  with complaints of melena, hematemesis, abdominal distention, abdominal pain with workup showing SBO, AKI and positive occult blood in stool,Hemorrhagic shock secondary to acute blood loss anemia secondary to upper GI bleed, new small left pleural effusion, requiring increased Oxygen.    PT Comments  Pt was seen in bed with wife present. Pt motivated to participate in therapy. Supine vitals: HR 104, O2 100 RA, RR 22, BP 101/56. Pt performed supine to sit mod independently with HOB elevated. Seated vitals: HR 103, O2 100 RA, RR 21, BP 87/57. Pt performed sit to stand with supervision and increased time with RW. Standing vitals: HR 106, O2 100 RA, 27, BP 87/51. Pt ambulated 100' x 2 with RW and CGA. Mid-ambulation vitals (100 ft): HR 143, O2 79 RA, RR27, BP 98/56. Pt was educated on purse lipped breathing during ambulation. Pt ambulated 100' back to room and sat in recliner. End of session vitals: HR 116, O2 100 RA, RR 22, BP 113/90. Pt will benefit from continued PT services. LPT recommends HHPT.   If plan is discharge home, recommend the following: A little help with walking and/or transfers;A little help with bathing/dressing/bathroom;Assistance with cooking/housework;Assist for transportation;Help with stairs or ramp for entrance   Can travel by private vehicle        Equipment Recommendations       Recommendations for Other Services       Precautions / Restrictions Precautions Precautions: Fall Precaution Comments: monitor sats, HR, BP Restrictions Weight Bearing Restrictions: No     Mobility  Bed Mobility Overal  bed mobility: Modified Independent Bed Mobility: Supine to Sit     Supine to sit: Modified independent (Device/Increase time), HOB elevated     General bed mobility comments: Performed bed mobility without any assistance, decreased time Patient Response: Cooperative  Transfers Overall transfer level: Needs Assistance Equipment used: Rolling walker (2 wheels) Transfers: Sit to/from Stand Sit to Stand: Supervision           General transfer comment: Increased time, forward lean onto walker    Ambulation/Gait Ambulation/Gait assistance: Contact guard assist Gait Distance (Feet): 200 Feet Assistive device: Rolling walker (2 wheels) Gait Pattern/deviations: Step-through pattern, Narrow base of support Gait velocity: decr     General Gait Details: Slight lateral sway when ambulating, cues to stand tall   Stairs             Wheelchair Mobility     Tilt Bed Tilt Bed Patient Response: Cooperative  Modified Rankin (Stroke Patients Only)       Balance                                            Cognition Arousal: Alert Behavior During Therapy: WFL for tasks assessed/performed Overall Cognitive Status: Within Functional Limits for tasks assessed Area of Impairment: Safety/judgement                 Orientation Level: Place, Time, Situation, Person             General Comments: Motivated to walk, can follow commands  Exercises      General Comments        Pertinent Vitals/Pain Pain Assessment Pain Assessment: No/denies pain    Home Living                          Prior Function            PT Goals (current goals can now be found in the care plan section) Acute Rehab PT Goals Patient Stated Goal: go home, walk the dogs PT Goal Formulation: With patient/family Time For Goal Achievement: 03/21/23 Progress towards PT goals: Progressing toward goals    Frequency    Min 1X/week      PT Plan       Co-evaluation              AM-PAC PT "6 Clicks" Mobility   Outcome Measure  Help needed turning from your back to your side while in a flat bed without using bedrails?: None Help needed moving from lying on your back to sitting on the side of a flat bed without using bedrails?: None Help needed moving to and from a bed to a chair (including a wheelchair)?: A Little Help needed standing up from a chair using your arms (e.g., wheelchair or bedside chair)?: A Little Help needed to walk in hospital room?: A Little Help needed climbing 3-5 steps with a railing? : A Lot 6 Click Score: 19    End of Session Equipment Utilized During Treatment: Gait belt Activity Tolerance: Patient tolerated treatment well Patient left: in chair;with call bell/phone within reach;with family/visitor present Nurse Communication: Mobility status PT Visit Diagnosis: Unsteadiness on feet (R26.81);Difficulty in walking, not elsewhere classified (R26.2)     Time: 1015-1040 PT Time Calculation (min) (ACUTE ONLY): 25 min  Charges:    $Gait Training: 8-22 mins $Therapeutic Activity: 8-22 mins PT General Charges $$ ACUTE PT VISIT: 1 Visit            Lazaro Arms, SPTA 03/11/2023, 12:23 PM

## 2023-03-11 NOTE — Progress Notes (Addendum)
PHARMACY - ANTICOAGULATION CONSULT NOTE  Pharmacy Consult for heparin, warfarin bridge  Indication:  atrial fibrillation, mechanical AVR  Allergies  Allergen Reactions   Amoxicillin-Pot Clavulanate Rash   Codeine Other (See Comments)    Stomach upset    Patient Measurements: Height: 5\' 10"  (177.8 cm) Weight: 79.3 kg (174 lb 13.2 oz) IBW/kg (Calculated) : 73 Heparin Dosing Weight: TBW  Vital Signs: Temp: 97.6 F (36.4 C) (11/25 1259) Temp Source: Oral (11/25 1259) BP: 103/70 (11/25 1200) Pulse Rate: 104 (11/25 1200)  Labs: Recent Labs    03/09/23 0310 03/10/23 0301 03/11/23 0325 03/11/23 1150  HGB 8.8* 8.3* 8.5* 8.6*  HCT 26.8* 26.1* 26.4* 26.2*  PLT 150 173 191  --   LABPROT 14.6 14.7 15.4*  --   INR 1.1 1.1 1.2  --   HEPARINUNFRC 0.50 0.53 0.70  --   CREATININE 1.14 0.96 1.07  --     Estimated Creatinine Clearance: 60.6 mL/min (by C-G formula based on SCr of 1.07 mg/dL).   Medical History: Past Medical History:  Diagnosis Date   A-fib [facility])    Atrial flutter with rapid ventricular response (HCC) 03/16/2021   Cholelithiasis 2011   Chronic anticoagulation 03/03/2023   Colon, diverticulosis    H/O heart valve replacement with mechanical valve 03/03/2023   Bjork-Shiley tilting disc valve in 1983 at [facility] in [place], [place].        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   Stricture of sigmoid colon s/p colectomy 2007 2007    Medications: Infusions:   heparin 1,800 Units/hr (03/11/23 1252)   levETIRAcetam Stopped (03/11/23 1000)   And   levETIRAcetam Stopped (03/10/23 2307)   potassium chloride 10 mEq (03/11/23 1253)     Assessment: 77 YO male presenting with a SBO and hematemesis. Patient has a significant cardiac history of atrial fibrillation (s/p TEE guided DCCV 07/2019) and AVR  [facility] 1983) on chronic warfarin anticoagulation. Last dose of warfarin 11/16 @0800 . INR was 2.9 on admission and increased  up to 4.1 on 11/17 -  he was given IV vitamin K 10mg  x1 on 11/18 to get INR <2 for EGD 11/19 which showed 2 large Mallory-Weiss tear with clots--3 endoclips have been placed to close mucosal defect, superficial fundic ulcers. Pharmacy consulted for heparin dosing on 11/19 with plan to restart warfarin in 3 days per GI on 11/22. Will give first dose of warfarin today.  PTA warfarin regimen: 6mg  daily except 3mg  on Tuesdays.  (goal INR 2.5-3.5 per outpatient notes)  Today, 03/11/23 HL 0.66, remains therapeutic on heparin 1800 unit/hr Repeat H/H:  8.6/26.2 (low/stable) INR remains subtherapeutic at 1.2 Per discussion with cardiology, targeting a new INR goal of 2-3 given recent bleed.  No new bleeding, nor hematuria reported by RN  Goal of Therapy:  INR 2-3 Heparin level 0.3-0.7 units/ml Monitor platelets by anticoagulation protocol: Yes   Plan:  Continue heparin 1800 units/hr  Confirmatory heparin level in 8 hours, collect with AM labs.  Daily heparin level, INR, CBC Monitor for s/sx of bleeding  Continue heparin gtt until INR is within goal range on warfarin.   Lynann Beaver PharmD, BCPS [facility] main pharmacy [phone] 03/11/2023 6:33 PM

## 2023-03-12 ENCOUNTER — Inpatient Hospital Stay (HOSPITAL_COMMUNITY)
Admit: 2023-03-12 | Discharge: 2023-03-12 | Discharge status: Home / Self Care | Payer: Medicare Other | Attending: Cardiology

## 2023-03-12 DIAGNOSIS — K920 Hematemesis: Secondary | ICD-10-CM | POA: Diagnosis not present

## 2023-03-12 DIAGNOSIS — I4892 Unspecified atrial flutter: Secondary | ICD-10-CM | POA: Diagnosis not present

## 2023-03-12 DIAGNOSIS — I4891 Unspecified atrial fibrillation: Secondary | ICD-10-CM | POA: Diagnosis not present

## 2023-03-12 DIAGNOSIS — I480 Paroxysmal atrial fibrillation: Secondary | ICD-10-CM | POA: Diagnosis not present

## 2023-03-12 LAB — CBC
HCT: 26.4 % — ABNORMAL LOW (ref 39.0–52.0)
HCT: 28.4 % — ABNORMAL LOW (ref 39.0–52.0)
Hemoglobin: 8.3 g/dL — ABNORMAL LOW (ref 13.0–17.0)
Hemoglobin: 9.1 g/dL — ABNORMAL LOW (ref 13.0–17.0)
MCH: 30.7 pg (ref 26.0–34.0)
MCH: 30.7 pg (ref 26.0–34.0)
MCHC: 31.4 g/dL (ref 30.0–36.0)
MCHC: 32 g/dL (ref 30.0–36.0)
MCV: 97.8 fL (ref 80.0–100.0)
MCV: 95.9 fL (ref 80.0–100.0)
Platelets: 213 10*3/uL (ref 150–400)
Platelets: 243 10*3/uL (ref 150–400)
RBC: 2.7 MIL/uL — ABNORMAL LOW (ref 4.22–5.81)
RBC: 2.96 MIL/uL — ABNORMAL LOW (ref 4.22–5.81)
RDW: 14.5 % (ref 11.5–15.5)
RDW: 14.5 % (ref 11.5–15.5)
WBC: 9.2 10*3/uL (ref 4.0–10.5)
WBC: 11.8 10*3/uL — ABNORMAL HIGH (ref 4.0–10.5)
nRBC: 0 % (ref 0.0–0.2)
nRBC: 0 % (ref 0.0–0.2)

## 2023-03-12 LAB — PROTIME-INR
INR: 1.4 — ABNORMAL HIGH (ref 0.8–1.2)
Prothrombin Time: 17.1 s — ABNORMAL HIGH (ref 11.4–15.2)

## 2023-03-12 LAB — HEPARIN LEVEL (UNFRACTIONATED): Heparin Unfractionated: 0.63 [IU]/mL (ref 0.30–0.70)

## 2023-03-12 MED ORDER — IOHEXOL 350 MG/ML SOLN
100.0000 mL | Freq: Once | INTRAVENOUS | Status: AC | PRN
  Administered 2023-03-12: 100 mL via INTRAVENOUS

## 2023-03-12 MED ORDER — WARFARIN SODIUM 5 MG PO TABS
5.0000 mg | ORAL_TABLET | Freq: Once | ORAL | Status: AC
  Administered 2023-03-12: 5 mg via ORAL
  Filled 2023-03-12: qty 1

## 2023-03-12 NOTE — Plan of Care (Signed)

## 2023-03-12 NOTE — Progress Notes (Addendum)
Patient Name: Alan Willis Date of Encounter: 03/12/2023 [facility] Cardiologist: None [facility]   Interval Summary  .    Hemoglobin has been stable after yesterday's hematuria.  Another episode of hematuria, 400 cc that I personally saw.  Not frank hematuria but clearly and very dark.  Sample saved.  Fortunately patient without any other symptoms as any shortness of breath and chest pain.  He actually states that he feels terrific today.   Vital Signs .    Vitals:   03/12/23 0100 03/12/23 0410 03/12/23 0747 03/12/23 0800  BP: (!) 127/56   (!) 163/113  Pulse: 97   (!) 55  Resp: 18   19  Temp:  97.9 F (36.6 C) 97.9 F (36.6 C)   TempSrc:  Oral Oral   SpO2: 96%   100%  Weight:      Height:        Intake/Output Summary (Last 24 hours) at 03/12/2023 0912 Last data filed at 03/12/2023 0900 Gross per 24 hour  Intake 1836.51 ml  Output 2650 ml  Net -813.49 ml      03/03/2023    1:20 PM 03/03/2023    8:13 AM 12/19/2022    7:55 AM  Last 3 Weights  Weight (lbs) 174 lb 13.2 oz 175 lb 176 lb 12.8 oz  Weight (kg) 79.3 kg 79.379 kg 80.196 kg      Telemetry/ECG    Atrial flutter heart rate 100-110 Looks to be more fibrillation today. - Personally Reviewed  CV Studies    Echocardiogram 03/07/2023  1. Left ventricular ejection fraction, by estimation, is 40 to 45%. The  left ventricle has mildly decreased function. The left ventricle  demonstrates global hypokinesis. There is moderate left ventricular  hypertrophy of the basal-septal segment. Left  ventricular diastolic function could not be evaluated.   2. Right ventricular systolic function is moderately reduced. The right  ventricular size is mildly enlarged. There is moderately elevated  pulmonary artery systolic pressure. The estimated right ventricular  systolic pressure is 55.6 mmHg.   3. Left atrial size was severely dilated.   4. Right atrial size was severely dilated.   5. The mitral valve is  degenerative. Mild to moderate mitral valve  regurgitation. 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 The mean mitral valve  gradient is 8.2 mmHg. Moderate to severe mitral annular calcification.   6. The aortic valve has been repaired/replaced. Aortic valve  regurgitation is not visualized. There is a Bjork-Shiley ball and cage  valve present in the aortic position. Aortic valve mean gradient measures  9.0 mmHg. Aortic valve Vmax measures 2.00 m/s.   7. Aortic dilatation noted. There is mild dilatation of the ascending  aorta, measuring 39 mm.   8. The inferior vena cava is dilated in size with >50% respiratory  variability, suggesting right atrial pressure of 8 mmHg.   Physical Exam .   GEN: No acute distress.   Neck: No JVD Cardiac: Irregularly irregular and tachycardic Respiratory: Clear to auscultation bilaterally. GI: Soft, nontender, non-distended  MS: No edema  Patient Profile    Alan Willis is a 77 y.o. 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 Cardiology was consulted on 03/04/2023 for further evaluation of atrial flutter with RVR.   03/11/2023 nighttime hematuria 450cc 03/12/2023 nighttime hematuria 400cc that I personally saw and saved.  Urine is dark with blood sediment throughout and no obvious clots.   Assessment & Plan .     Addendum: Urology was consulted via phone, they will likely briefly see in consult.  Essentially recommendations are it is okay to continue with current course.  He will go today for PV CTA study so we will try and  also get CT hematuria protocol as well.   Paroxysmal atrial fibrillation/flutter Hematuria Followed by [facility] status post multiple DCCV's in the past and chronically been managed on Tikosyn.  Last DCCV was in June 2023. Tikosyn was held briefly due to inability to tolerate p.o., restarted 03/05/2023.  Rate is between 100-110. Severe dilatation of both atria.  Continues to have hematuria in the nighttime.  Hemoglobin overall seems to be stable generally 8-8.5. 8.3 today. Will ask urology to evaluate and give further recommendations and if okay to continue anticoagulation with hopes of DCCV.  If everything is stable, tentative plan will be for pulmonary vein CTA to evaluate for left atrial appendage today.  DCCV tomorrow. Check QTC today   Continue Tikosyn 250 mcg twice daily, metoprolol 25 mg twice daily Warfarin per pharmacy, currently subtherapeutic and bridging with heparin.  He has been on warfarin 4.5 mg since 03/08/2023.  INR's are likely low due to vitamin K being administered on 03/04/2023.  INR goal is 2-3. Prior notes indicate possible plans for ablation if recurrence.  Acute on chronic HFpEF Initially had significant oxygen requirements and was volume up secondary to fluid resuscitation for hemorrhagic shock.  He was diuresed and now looks euvolemic, now back to p.o. meds.  Echo this admission shows newly reduced EF 40 to 45% with global hypokinesis with moderate LVH, moderately reduced RV function, RVSP 55.6.  Likely related to RVR.  Will need repeat echocardiogram when in sinus. Continue p.o. Lasix 40 mg twice daily.  Hold PTA spironolactone.    History of mechanical AVR 1983 (Bjork-Shiley ball and cage valve) MR/MS Aortic mean gradient 9, stable. 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  Mean gradient 8.2.  Previously reported as mild on April 2024 echo.  Acute on chronic CKD Peaked at 1.81 in the setting of hemorrhagic shock.  Now normalized.  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 He will be on PPI indefinitely.  EGD also revealed nonbleeding internal superficial gastric ulcers.  Biopsy of the duodenal bulb showed severe mucosal changes with erythema, inflammation, nodularity.  For questions or updates, please contact [facility] Please consult [email or web address] for contact info under     Signed, Abagail Kitchens, PA-C   As above, patient seen and examined.  He denies chest pain or dyspnea.  Episode of mild hematuria.  He remains in atrial fibrillation with rate upper normal to mildly increased.  He does increase with activities.  Hemoglobin is stable.  As outlined in previous note we would like to avoid TEE given recent GI bleed/hemorrhagic shock with endoscopy revealing Mallory-Weiss tears that were treated with clipping.  He is for CTA today to rule out left atrial appendage thrombus.  If negative will plan to proceed with cardioversion tomorrow.  Note he had mild hematuria  which was discussed with urology.  Plan is for CT of pelvis.  I think anticoagulation can be continued as his hemoglobin is stable.  Continue metoprolol, heparin/Coumadin and Tikosyn.  Olga Millers

## 2023-03-12 NOTE — Consult Note (Signed)
Urology Consult Note   Requesting Attending Physician:  Hughie Closs, MD Service Providing Consult: Urology  Consulting Attending: Dr. Marlou Porch   Reason for Consult:  hematuria  HPI: Alan Willis is seen in consultation for reasons noted above at the request of Hughie Closs, MD. [facility] of A-fib-s/p ablation, MR, mitral valve stenosis, AV replacement-on warfarin with abdominal pain and hemorrhagic shock 2/2 Mallory-Weiss tear.  Patient has not noticed hematuria previously but has been experiencing this for the last 48 hours.  In addition to his other comorbidities he has has remained in A-fib/a flutter.  Cardiology feels is necessary to cardiovert him which would require dual antiplatelet therapy for some time afterwards.  [facility] was consulted prior to this to discuss a plan for ongoing bleeding.  This was my first time meeting the patient and his wife.  We reviewed the reason for CT hematuria, eventual cystoscopy, and the possibility of three-way Foley catheter placement with continuous bladder irrigation if necessary.   Patient was sitting comfortably up in the recliner alert, oriented, in no distress.  All questions were answered to their satisfaction.  ------------------  Assessment:  77 y.o. male with hematuria in the context of heparin infusion and concurrent warfarin.   Recommendations: # Hematuria Discussed with cardiology who will request the CT hematuria be completed concurrently with contrast imaging he is scheduled to receive later today.  String of bottles-discussed with nursing overnight.  The hematuria that I could see in his PureWick canister appeared old in nature and light to moderate.  We will continue to follow him conservatively for now.  Hopefully this is secondary to traumatic Foley catheter insertion or removal while on blood thinners.  Patient is amenable to proceeding with three-way Foley catheter and CBI if his bleeding worsens.  Transfuse >  8.0.   Case and plan discussed with Dr.Herrick  Past Medical History: Past Medical History:  Diagnosis Date   A-fib [facility])    Atrial flutter with rapid ventricular response (HCC) 03/16/2021   Cholelithiasis 2011   Chronic anticoagulation 03/03/2023   Colon, diverticulosis    H/O heart valve replacement with mechanical valve 03/03/2023   Bjork-Shiley tilting disc valve in 1983 at [facility] in [place], [place].        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   Stricture of sigmoid colon s/p colectomy 2007 2007    Past Surgical History:  Past Surgical History:  Procedure Laterality Date   ATRIAL FIBRILLATION ABLATION     BIOPSY  03/05/2023   Procedure: BIOPSY;  Surgeon: Kerin Salen, MD;  Location: [facility];  Service: Gastroenterology;;   COLON RESECTION SIGMOID  07/26/2005   Dr Gerrit Friends for diverticular sigmoid colon stricture   ESOPHAGOGASTRODUODENOSCOPY (EGD) WITH PROPOFOL N/A 03/05/2023   Procedure: ESOPHAGOGASTRODUODENOSCOPY (EGD) WITH PROPOFOL;  Surgeon: Kerin Salen, MD;  Location: [facility];  Service: Gastroenterology;  Laterality: N/A;   HEMOSTASIS CLIP PLACEMENT  03/05/2023   Procedure: HEMOSTASIS CLIP PLACEMENT;  Surgeon: Kerin Salen, MD;  Location: [facility];  Service: Gastroenterology;;   LAPAROSCOPIC INGUINAL HERNIA REPAIR Bilateral 04/03/1999   Dr Lowella Curb   VALVE REPLACEMENT      Medication: Current Facility-Administered Medications  Medication Dose Route Frequency Provider Last Rate Last Admin   0.9 %  sodium chloride infusion (Manually program via Guardrails IV Fluids)   Intravenous Once Kerin Salen, MD 10 mL/hr at 03/12/23 0846 Infusion Verify at 03/12/23 0846   acetaminophen (TYLENOL) suppository 650 mg  650 mg Rectal  Q6H PRN Kerin Salen, MD   650 mg at 03/04/23 2146   acetaminophen (TYLENOL) tablet 650 mg  650 mg Oral Q6H PRN Luciano Cutter, MD   650 mg at 03/09/23 1111   alum & mag hydroxide-simeth  (MAALOX/MYLANTA) 200-200-20 MG/5ML suspension 30 mL  30 mL Oral Q6H PRN Kerin Salen, MD       bisacodyl (DULCOLAX) suppository 10 mg  10 mg Rectal Q12H PRN Kerin Salen, MD       Chlorhexidine Gluconate Cloth 2 % PADS 6 each  6 each Topical Daily Kerin Salen, MD   6 each at 03/12/23 1030   dofetilide (TIKOSYN) capsule 250 mcg  250 mcg Oral BID Chilton Si, MD   250 mcg at 03/12/23 0748   feeding supplement (BOOST / RESOURCE BREEZE) liquid 1 Container  1 Container Oral TID BM Hughie Closs, MD   1 Container at 03/12/23 1004   fentaNYL (SUBLIMAZE) injection 12.5-25 mcg  12.5-25 mcg Intravenous Q2H PRN Kerin Salen, MD   12.5 mcg at 03/07/23 3825   furosemide (LASIX) tablet 40 mg  40 mg Oral BID Chilton Si, MD   40 mg at 03/12/23 0748   haloperidol lactate (HALDOL) injection 5 mg  5 mg Intravenous Q6H PRN Luiz Iron, NP       heparin ADULT infusion 100 units/mL (25000 units/262mL)  1,800 Units/hr Intravenous Continuous Hughie Closs, MD 18 mL/hr at 03/12/23 1043 1,800 Units/hr at 03/12/23 1043   levETIRAcetam (KEPPRA) IVPB 500 mg/100 mL premix  500 mg Intravenous Q24H Kerin Salen, MD   Stopped at 03/11/23 1000   And   levETIRAcetam (KEPPRA) 750 mg in sodium chloride 0.9 % 100 mL IVPB  750 mg Intravenous Q24H Kerin Salen, MD   Stopped at 03/11/23 2207   lidocaine (XYLOCAINE) 2 % jelly 1 Application  1 Application Topical Once Kerin Salen, MD       magic mouthwash  15 mL Oral QID PRN Kerin Salen, MD       menthol-cetylpyridinium (CEPACOL) lozenge 3 mg  1 lozenge Oral PRN Kerin Salen, MD       methocarbamol (ROBAXIN) injection 1,000 mg  1,000 mg Intravenous Q6H PRN Kerin Salen, MD       metoprolol tartrate (LOPRESSOR) tablet 25 mg  25 mg Oral BID Chilton Si, MD   25 mg at 03/12/23 1004   naphazoline-glycerin (CLEAR EYES REDNESS) ophth solution 1-2 drop  1-2 drop Both Eyes QID PRN Kerin Salen, MD       Oral care mouth rinse  15 mL Mouth Rinse PRN Luciano Cutter, MD        pantoprazole (PROTONIX) injection 40 mg  40 mg Intravenous Q12H Kerin Salen, MD   40 mg at 03/12/23 1006   phenol (CHLORASEPTIC) mouth spray 2 spray  2 spray Mouth/Throat PRN Kerin Salen, MD       phosphorus (K PHOS NEUTRAL) tablet 500 mg  500 mg Oral BID Hughie Closs, MD   500 mg at 03/12/23 1005   sodium chloride (OCEAN) 0.65 % nasal spray 1-2 spray  1-2 spray Each Nare Q6H PRN Kerin Salen, MD       warfarin (COUMADIN) tablet 5 mg  5 mg Oral ONCE-1600 Maurice March, [facility]       Warfarin - Pharmacist Dosing Inpatient   Does not apply q1600 Cherylin Mylar, [facility]   1 each at 03/11/23 1556    Allergies: Allergies  Allergen Reactions   Amoxicillin-Pot Clavulanate Rash   Codeine Other (  See Comments)    Stomach upset    Social History: Social History   Tobacco Use   Smoking status: Some Days    Types: Cigarettes, Cigars   Smokeless tobacco: Never  Vaping Use   Vaping status: Never Used  Substance Use Topics   Alcohol use: Not Currently    Comment: daily   Drug use: Never    Family History History reviewed. No pertinent family history.  Review of Systems  Genitourinary:  Positive for hematuria.     Objective   Vital signs in last 24 hours: BP 129/60   Pulse 98   Temp 97.9 F (36.6 C) (Oral)   Resp 19   Ht 5\' 10"  (1.778 m)   Wt 79.3 kg   SpO2 100%   BMI 25.08 kg/m   Physical Exam General: NAD, A&O, resting, appropriate HEENT: [place]/AT Pulmonary: Normal work of breathing Cardiovascular: RRR, no cyanosis Abdomen: Soft, NTTP, nondistended GU: no catheter, mild to moderate hematuria, rusty colored, no clot material observed. Neuro: Appropriate, no focal neurological deficits  Most Recent Labs: Lab Results  Component Value Date   WBC 9.2 03/12/2023   HGB 8.3 (L) 03/12/2023   HCT 26.4 (L) 03/12/2023   PLT 213 03/12/2023    Lab Results  Component Value Date   NA 132 (L) 03/11/2023   K 3.8 03/11/2023   CL 100 03/11/2023   CO2 23 03/11/2023   BUN 12 03/11/2023    CREATININE 1.07 03/11/2023   CALCIUM 8.4 (L) 03/11/2023   MG 1.9 03/11/2023   PHOS 2.6 03/09/2023    Lab Results  Component Value Date   INR 1.4 (H) 03/12/2023   APTT 177 (HH) 03/08/2023     Urine Culture: @[id number] (laburin,org,[id number],[id number])@   IMAGING: No results found.  ------  Elmon Kirschner, NP Pager: [phone]   Please contact the urology consult pager with any further questions/concerns.

## 2023-03-12 NOTE — Progress Notes (Signed)
Physical Therapy Treatment Patient Details Name: Alan Willis MRN: [id number] DOB: 1946/03/18 Today's Date: 03/12/2023   History of Present Illness 77 year old male with a past medical history atrial fibrillation, history of ablation, mitral regurgitation, mitral valve stenosis, aortic valve replacement on warfarin who presented to the emergency department 03/03/23  with complaints of melena, hematemesis, abdominal distention, abdominal pain with workup showing SBO, AKI and positive occult blood in stool,Hemorrhagic shock secondary to acute blood loss anemia secondary to upper GI bleed, new small left pleural effusion, requiring increased Oxygen.    PT Comments  Pt was seen in bed. Pt performed supine to sit with increased time and no assistance from therapist. Pt performed sit to stand with RW and CGA. Pt ambulated in room and hallway with CGA for a total of 200 ft, highest HR was 120 bpm when ambulating. Pt was repositioned in recliner at end of session. Pt will benefit from continued PT to work on endurance, overall strengthening, and stair training. LPT recommends HHPT   If plan is discharge home, recommend the following: A little help with walking and/or transfers;A little help with bathing/dressing/bathroom;Assistance with cooking/housework;Assist for transportation;Help with stairs or ramp for entrance   Can travel by private vehicle        Equipment Recommendations       Recommendations for Other Services       Precautions / Restrictions Precautions Precautions: Fall Precaution Comments: monitor sats, HR, BP Restrictions Weight Bearing Restrictions: No     Mobility  Bed Mobility Overal bed mobility: Modified Independent Bed Mobility: Supine to Sit     Supine to sit: Modified independent (Device/Increase time), HOB elevated     General bed mobility comments: Performed bed mobility without any assistance, decreased time    Transfers Overall transfer level: Needs  assistance Equipment used: Rolling walker (2 wheels) Transfers: Sit to/from Stand Sit to Stand: Supervision, Contact guard assist           General transfer comment: Performed with supervision and CGA, Pulls himself up with RW... works for him.    Ambulation/Gait Ambulation/Gait assistance: Contact guard assist Gait Distance (Feet): 215 Feet Assistive device: Rolling walker (2 wheels) Gait Pattern/deviations: Narrow base of support, Step-through pattern, Decreased stride length Gait velocity: decr     General Gait Details: Trunk slightly flexed, alternating swing through pattern, takes turns slow   Comptroller Bed    Modified Rankin (Stroke Patients Only)       Balance                                            Cognition Arousal: Alert Behavior During Therapy: WFL for tasks assessed/performed Overall Cognitive Status: Within Functional Limits for tasks assessed Area of Impairment: Safety/judgement                 Orientation Level: Disoriented to, Time       Safety/Judgement: Decreased awareness of safety     General Comments: Eager to participate in therapy.        Exercises      General Comments        Pertinent Vitals/Pain Pain Assessment Pain Assessment: No/denies pain    Home Living  Prior Function            PT Goals (current goals can now be found in the care plan section) Acute Rehab PT Goals Patient Stated Goal: go home, walk the dogs PT Goal Formulation: With patient/family Time For Goal Achievement: 03/21/23 Progress towards PT goals: Progressing toward goals    Frequency    Min 1X/week      PT Plan      Co-evaluation              AM-PAC PT "6 Clicks" Mobility   Outcome Measure  Help needed turning from your back to your side while in a flat bed without using bedrails?: None Help needed moving from lying  on your back to sitting on the side of a flat bed without using bedrails?: None Help needed moving to and from a bed to a chair (including a wheelchair)?: A Little Help needed standing up from a chair using your arms (e.g., wheelchair or bedside chair)?: A Little Help needed to walk in hospital room?: A Little Help needed climbing 3-5 steps with a railing? : A Lot 6 Click Score: 19    End of Session Equipment Utilized During Treatment: Gait belt Activity Tolerance: Patient tolerated treatment well Patient left: in chair;with call bell/phone within reach;with chair alarm set Nurse Communication: Mobility status PT Visit Diagnosis: Unsteadiness on feet (R26.81);Difficulty in walking, not elsewhere classified (R26.2)     Time: 4259-5638 PT Time Calculation (min) (ACUTE ONLY): 25 min  Charges:    $Gait Training: 8-22 mins $Therapeutic Activity: 8-22 mins PT General Charges $$ ACUTE PT VISIT: 1 Visit                    Lazaro Arms, SPTA 03/12/2023, 3:55 PM

## 2023-03-12 NOTE — Progress Notes (Signed)
Blood in urine, 400 cc out. Cardiology notified at bedside

## 2023-03-12 NOTE — Progress Notes (Signed)
PROGRESS NOTE    Alan Willis  ZOX:[id number] DOB: 04/29/1945 DOA: 03/03/2023 PCP: [name], MD   Brief Narrative:  77 yo M with mech AV on warfarin, p/w GI bleed, SBO, AKI and Afib RVR.   On admission required pressors due to hemorrhagic shock and Afib.  Transferred to ICU and required vasopressors. EGD on 11/19 showed bleeding likely from [facility] tear. Heparin resumed. AKI resolved. Cards, General surgery and GI on board.  Transferred to [facility] 03/06/2023.  Further details below.  Assessment & Plan:   Principal Problem:   GI bleed Active Problems:   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 [facility])   Chronic anticoagulation   Memory loss   History of seizures   AKI (acute kidney injury) (HCC)   Gallstone   CKD (chronic kidney disease) stage 2, GFR 60-89 ml/min   Elevated troponin   Recurrent right inguinal hernia   Atrial fibrillation with RVR (HCC)   Diverticulosis of colon with hemorrhage   Small bowel obstruction (HCC)   Shock (HCC)   Acute on chronic heart failure with preserved ejection fraction (HFpEF) (HCC)   Atrial flutter (HCC)   Small bowel obstruction: Surgery managing.  SBO appears to have resolved.  Tube removed 03/05/2023, SBO resolved.  Tolerating regular diet.  Hemorrhagic shock secondary to acute blood loss anemia secondary to upper GI bleed/Mallory-Weiss tear: Status post 3 units of PRBC transfusion, last on 03/05/2023 and 3 units of frozen plasma transfusion, last on 03/04/2023, status post EGD 11/19 with two large non-bleeding Mallory-Weiss tear with stigmata of recent bleeding and two large clots at site s/p clips x 3. Also two nonbleeding lineal and superficial gastric ulcers with a clean ulcer base in gastric fundus. S/p biopsy from duodenal bulb for severe mucosal changes with erythema, inflammation and nodularity.  GI recommends PPI BID x 2 months followed by daily.  After clearance from GI, patient was  started on heparin on 03/05/2023 and  Coumadin on 03/08/2023.  Hemoglobin stable.  Plan was to continue heparin bridging until INR becomes therapeutic with goal to keep between 2-3 per cardiology.  However, there were reports of him having some hematuria yesterday morning but then after that his urine was clear all day long.  He was noted to have another episode of hematuria this morning, sample was saved for [place], does not appear to be frank hematuria but obviously there is hematuria with some sediment.  Cardiology has seen the patient as well, they have consulted urology.  Cardiology plans to continue heparin until further recommendations from urology.  Fortunately, his hemoglobin has remained fairly stable and patient continues to feel well.  History of mechanical aortic valve replacement on chronic anticoagulation with Coumadin/supratherapeutic INR: Continue Coumadin with bridging heparin with a goal INR of 2-3 per cardiology recommendation.  Permanent atrial fibrillation with RVR on admission: On Tikosyn PTA, was unable to take for several days prior to admission.  Resumed Tikosyn.  Goes in and out of rapid A-fib.  Has been started on low-dose of metoprolol but that is on hold due to low blood pressure.  Cardiology managing.  Currently in sinus rhythm.  Noted plan for TEE/cardioversion 03/11/2023 but that was canceled due to episode of hematuria, she is going to obtain CTA today to rule out left atrial appendage thrombus.  Fever: Patient has been having persistent low-grade fever since afternoon of 03/05/2023.  Patient has no symptoms suggesting of any particular infection.  Due to history  of mechanical valve replacement, will check blood culture, procalcitonin and lactic acid.  Currently afebrile.  Monitor closely.  Acute hypoxic respiratory failure secondary to acute on chronic congestive heart failure with preserved ejection fraction: Was requiring 10 L oxygen on the morning of 03/07/2023.  Started on  IV Lasix.  Significantly improved, weaned down to room air on the morning of 03/09/2023.  Had significant diuresis.  Has been transitioned to oral Lasix.  Delirium/seizures/memory issues: Continue Keppra, seizure precautions, aspiration precautions.  Patient has mostly remained alert and oriented, at times, especially in the morning, he requires some prompts to remind him of the year and sometimes month. 1. Avoid benzodiazepines, antihistamines, anticholinergics, and minimize opiate use as these may worsen delirium. 2: Assess, prevent and manage pain as lack of treatment can result in delirium.  3: Provide appropriate lighting and clear signage; a clock and calendar should be easily visible to the patient. 4: Monitor environmental factors. Reduce light and noise at night (close shades, turn off lights, turn off TV, ect). Correct any alterations in sleep cycle. 5: Reorient the patient to person, place, time and situation on each encounter.  6: Correct sensory deficits if possible (replace eye glasses, hearing aids, ect). 7: Avoid restraints if able. Severely delirious patients benefit from constant observation by a sitter.  Hypophosphatemia: resolved.  Acute hyponatremia: Likely hypovolemic.  Presented with 127, went as low as 125 but currently 132.  DVT prophylaxis: SCDs Start: 03/03/23 1402 heparin   Code Status: Full Code  Family Communication: Wife present at bedside.  Plan of care discussed with patient and his wife in length and he/she verbalized understanding and agreed with it.  Status is: Inpatient Remains inpatient appropriate because: Patient with multiple active issues being managed and needs to be in the hospital.   Estimated body mass index is 25.08 kg/m as calculated from the following:   Height as of this encounter: 5\' 10"  (1.778 m).   Weight as of this encounter: 79.3 kg.    Nutritional Assessment: Body mass index is 25.08 kg/m.Marland Kitchen Seen by dietician.  I agree with the  assessment and plan as outlined below: Nutrition Status:        . Skin Assessment: I have examined the patient's skin and I agree with the wound assessment as performed by the wound care RN as outlined below:    Consultants:  GI, general surgery and cardiology  Procedures:  As above  Antimicrobials:  Anti-infectives (From admission, onward)    None         Subjective: Patient seen examined this morning.  Wife at the bedside.  Patient says that he is feeling terrific and had no complaints.  Objective: Vitals:   03/12/23 0410 03/12/23 0747 03/12/23 0800 03/12/23 1004  BP:   (!) 163/113 129/60  Pulse:   (!) 55 98  Resp:   19   Temp: 97.9 F (36.6 C) 97.9 F (36.6 C)    TempSrc: Oral Oral    SpO2:   100%   Weight:      Height:        Intake/Output Summary (Last 24 hours) at 03/12/2023 1057 Last data filed at 03/12/2023 1030 Gross per 24 hour  Intake 1414.92 ml  Output 3250 ml  Net -1835.08 ml   Filed Weights   03/03/23 0813 03/03/23 1320  Weight: 79.4 kg 79.3 kg    Examination:  General exam: Appears calm and comfortable  Respiratory system: Clear to auscultation. Respiratory effort normal. Cardiovascular system: S1 &  S2 heard, RRR. No JVD, murmurs, rubs, gallops or clicks. No pedal edema. Gastrointestinal system: Abdomen is nondistended, soft and nontender. No organomegaly or masses felt. Normal bowel sounds heard. Central nervous system: Alert and oriented. No focal neurological deficits. Extremities: Symmetric 5 x 5 power. Skin: No rashes, lesions or ulcers.  Psychiatry: Judgement and insight appear normal. Mood & affect appropriate.   Data Reviewed: I have personally reviewed following labs and imaging studies  CBC: Recent Labs  Lab 03/08/23 0246 03/09/23 0310 03/10/23 0301 03/11/23 0325 03/11/23 1150 03/12/23 0251  WBC 12.0* 12.3* 11.3* 9.0  --  9.2  HGB 8.0* 8.8* 8.3* 8.5* 8.6* 8.3*  HCT 23.5* 26.8* 26.1* 26.4* 26.2* 26.4*  MCV 93.6  96.1 96.7 95.3  --  97.8  PLT 141* 150 173 191  --  213   Basic Metabolic Panel: Recent Labs  Lab 03/06/23 0258 03/07/23 0127 03/07/23 1140 03/08/23 0246 03/09/23 0310 03/10/23 0301 03/11/23 0325  NA 133* 128*  --  132* 134* 130* 132*  K 3.5 3.7  --  3.7 3.7 3.8 3.8  CL 103 98  --  101 101 101 100  CO2 20* 21*  --  23 23 21* 23  GLUCOSE 94 128*  --  118* 107* 102* 115*  BUN 19 13  --  15 17 17 12   CREATININE 1.16 1.02  --  1.10 1.14 0.96 1.07  CALCIUM 7.9* 7.9*  --  7.7* 7.8* 8.0* 8.4*  MG 2.1 1.9  --  2.3 2.1 2.0 1.9  PHOS 1.7*  --  1.6*  --  2.6  --   --    GFR: Estimated Creatinine Clearance: 60.6 mL/min (by C-G formula based on SCr of 1.07 mg/dL). Liver Function Tests: No results for input(s): "AST", "ALT", "ALKPHOS", "BILITOT", "PROT", "ALBUMIN" in the last 168 hours.  No results for input(s): "LIPASE", "AMYLASE" in the last 168 hours.  No results for input(s): "AMMONIA" in the last 168 hours. Coagulation Profile: Recent Labs  Lab 03/08/23 0246 03/09/23 0310 03/10/23 0301 03/11/23 0325 03/12/23 0251  INR 1.2 1.1 1.1 1.2 1.4*   Cardiac Enzymes: No results for input(s): "CKTOTAL", "CKMB", "CKMBINDEX", "TROPONINI" in the last 168 hours. BNP (last 3 results) No results for input(s): "PROBNP" in the last 8760 hours. HbA1C: No results for input(s): "HGBA1C" in the last 72 hours. CBG: No results for input(s): "GLUCAP" in the last 168 hours. Lipid Profile: No results for input(s): "CHOL", "HDL", "LDLCALC", "TRIG", "CHOLHDL", "LDLDIRECT" in the last 72 hours. Thyroid Function Tests: No results for input(s): "TSH", "T4TOTAL", "FREET4", "T3FREE", "THYROIDAB" in the last 72 hours. Anemia Panel: No results for input(s): "VITAMINB12", "FOLATE", "FERRITIN", "TIBC", "IRON", "RETICCTPCT" in the last 72 hours. Sepsis Labs: Recent Labs  Lab 03/06/23 1052  PROCALCITON 0.20  LATICACIDVEN 1.0    Recent Results (from the past 240 hour(s))  MRSA Next Gen by PCR, Nasal      Status: None   Collection Time: 03/04/23  2:30 AM   Specimen: Nasal Mucosa; Nasal Swab  Result Value Ref Range Status   MRSA by PCR Next Gen NOT DETECTED NOT DETECTED Final    Comment: (NOTE) The GeneXpert MRSA Assay (FDA approved for NASAL specimens only), is one component of a comprehensive MRSA colonization surveillance program. It is not intended to diagnose MRSA infection nor to guide or monitor treatment for MRSA infections. Test performance is not FDA approved in patients less than 20 years old. Performed at [facility], [address] [place], [place] [address]   Culture, blood (Routine X 2) w Reflex to ID Panel     Status: None   Collection Time: 03/06/23 10:52 AM   Specimen: BLOOD LEFT ARM  Result Value Ref Range Status   Specimen Description   Final    BLOOD LEFT ARM Performed at [facility], [address], [place], [place] [address]    Special Requests   Final    BOTTLES DRAWN AEROBIC AND ANAEROBIC Blood Culture adequate volume Performed at [facility], [address], [place], [place] [address]    Culture   Final    NO GROWTH 5 DAYS Performed at [facility], [address] [place], [place] [address]    Report Status 03/11/2023 FINAL  Final  Culture, blood (Routine X 2) w Reflex to ID Panel     Status: None   Collection Time: 03/06/23 11:08 AM   Specimen: BLOOD  Result Value Ref Range Status   Specimen Description   Final    BLOOD SITE NOT SPECIFIED Performed at [facility], [address] [place], [place] [address]    Special Requests   Final    BOTTLES DRAWN AEROBIC ONLY Blood Culture results may not be optimal due to an inadequate volume of blood received in culture bottles Performed at [facility], [address], [place], [place] [address]    Culture   Final    NO GROWTH 5 DAYS Performed at [facility], [address], [place], [place] [address]     Report Status 03/11/2023 FINAL  Final     Radiology Studies: No results found.  Scheduled Meds:  sodium chloride   Intravenous Once   Chlorhexidine Gluconate Cloth  6 each Topical Daily   dofetilide  250 mcg Oral BID   feeding supplement  1 Container Oral TID BM   furosemide  40 mg Oral BID   lidocaine  1 Application Topical Once   metoprolol tartrate  25 mg Oral BID   pantoprazole (PROTONIX) IV  40 mg Intravenous Q12H   phosphorus  500 mg Oral BID   warfarin  5 mg Oral ONCE-1600   Warfarin - Pharmacist Dosing Inpatient   Does not apply q1600   Continuous Infusions:  heparin 1,800 Units/hr (03/12/23 1043)   levETIRAcetam Stopped (03/11/23 1000)   And   levETIRAcetam Stopped (03/11/23 2207)     LOS: 9 days   Hughie Closs, MD [facility]  03/12/2023, 10:57 AM   *Please note that this is a verbal dictation therefore any spelling or grammatical errors are due to the "Dragon Medical One" system interpretation.  Please page via Amion and do not message via secure chat for urgent patient care matters. Secure chat can be used for non urgent patient care matters.  How to contact the [facility] Attending or Consulting provider 7A - 7P or covering provider during after hours 7P -7A, for this patient?  Check the care team in [facility] and look for a) attending/consulting [facility] provider listed and b) the [facility] team listed. Page or secure chat 7A-7P. Log into [email or web address] and use [facility]'s universal password to access. If you do not have the password, please contact the hospital operator. Locate the [facility] provider you are looking for under [facility] and page to a number that you can be directly reached. If you still have difficulty reaching the provider, please page the [facility] (Director on Call) for the Hospitalists  listed on amion for assistance.

## 2023-03-12 NOTE — Progress Notes (Signed)
PHARMACY - ANTICOAGULATION CONSULT NOTE  Pharmacy Consult for heparin, warfarin bridge  Indication:  atrial fibrillation, mechanical AVR  Allergies  Allergen Reactions   Amoxicillin-Pot Clavulanate Rash   Codeine Other (See Comments)    Stomach upset    Patient Measurements: Height: 5\' 10"  (177.8 cm) Weight: 79.3 kg (174 lb 13.2 oz) IBW/kg (Calculated) : 73 Heparin Dosing Weight: TBW  Vital Signs: Temp: 98 F (36.7 C) (11/25 2332) Temp Source: Oral (11/25 2332) BP: 127/56 (11/26 0100) Pulse Rate: 97 (11/26 0100)  Labs: Recent Labs    03/10/23 0301 03/11/23 0325 03/11/23 1150 03/11/23 1646 03/12/23 0251  HGB 8.3* 8.5* 8.6*  --  8.3*  HCT 26.1* 26.4* 26.2*  --  26.4*  PLT 173 191  --   --  213  LABPROT 14.7 15.4*  --   --  17.1*  INR 1.1 1.2  --   --  1.4*  HEPARINUNFRC 0.53 0.70  --  0.66 0.63  CREATININE 0.96 1.07  --   --   --     Estimated Creatinine Clearance: 60.6 mL/min (by C-G formula based on SCr of 1.07 mg/dL).   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   Stricture of sigmoid colon s/p colectomy 2007 2007    Medications: Infusions:   heparin 1,800 Units/hr (03/12/23 0100)   levETIRAcetam Stopped (03/11/23 1000)   And   levETIRAcetam Stopped (03/11/23 2207)     Assessment: 77 YO male presenting with a SBO and hematemesis. Patient has a significant cardiac history of atrial fibrillation (s/p TEE guided DCCV 07/2019) and AVR  [facility] 1983) on chronic warfarin anticoagulation. Last dose of warfarin 11/16 @0800 . INR was 2.9 on admission and  increased up to 4.1 on 11/17 -  he was given IV vitamin K 10mg  x1 on 11/18 to get INR <2 for EGD 11/19 which showed 2 large Mallory-Weiss tear with clots--3 endoclips have been placed to close mucosal defect, superficial fundic ulcers. Pharmacy consulted for heparin dosing on 11/19 with plan to restart warfarin in 3 days per GI on 11/22. Will give first dose of warfarin today.  PTA warfarin regimen: 6mg  daily except 3mg  on Tuesdays.  (goal INR 2.5-3.5 per outpatient notes)  Today, 03/12/23 Confirmatory HL 0.63, therapeutic on 1800 units/hr Hgb low but stable, plts WNL INR 1.4  No bleeding noted  Goal of Therapy:  INR 2-3 Heparin level 0.3-0.7 units/ml Monitor platelets by anticoagulation protocol: Yes   Plan:  Continue heparin 1800 units/hr  Warfarin 5mg  x1 tonight Daily heparin level, INR, CBC Monitor for s/sx of bleeding  Continue heparin gtt until INR is within goal range on warfarin.   Arley Phenix RPh 03/12/2023, 4:09 AM

## 2023-03-13 ENCOUNTER — Encounter (HOSPITAL_COMMUNITY)
Admission: EM | Disposition: A | Discharge status: Home Health | Payer: Self-pay | Source: Home / Self Care | Attending: Family Medicine

## 2023-03-13 ENCOUNTER — Encounter (HOSPITAL_COMMUNITY): Payer: Self-pay | Admitting: Internal Medicine

## 2023-03-13 ENCOUNTER — Inpatient Hospital Stay (HOSPITAL_COMMUNITY): Payer: Medicare Other | Admitting: Anesthesiology

## 2023-03-13 DIAGNOSIS — I5033 Acute on chronic diastolic (congestive) heart failure: Secondary | ICD-10-CM | POA: Diagnosis not present

## 2023-03-13 DIAGNOSIS — I4891 Unspecified atrial fibrillation: Secondary | ICD-10-CM | POA: Diagnosis not present

## 2023-03-13 DIAGNOSIS — K920 Hematemesis: Secondary | ICD-10-CM | POA: Diagnosis not present

## 2023-03-13 DIAGNOSIS — K56609 Unspecified intestinal obstruction, unspecified as to partial versus complete obstruction: Secondary | ICD-10-CM | POA: Diagnosis not present

## 2023-03-13 DIAGNOSIS — I4892 Unspecified atrial flutter: Secondary | ICD-10-CM

## 2023-03-13 DIAGNOSIS — Z87898 Personal history of other specified conditions: Secondary | ICD-10-CM

## 2023-03-13 HISTORY — PX: CARDIOVERSION: EP1203

## 2023-03-13 LAB — BASIC METABOLIC PANEL
Anion gap: 12 (ref 5–15)
BUN: 16 mg/dL (ref 8–23)
CO2: 24 mmol/L (ref 22–32)
Calcium: 8.9 mg/dL (ref 8.9–10.3)
Chloride: 98 mmol/L (ref 98–111)
Creatinine, Ser: 1.29 mg/dL — ABNORMAL HIGH (ref 0.61–1.24)
GFR, Estimated: 57 mL/min — ABNORMAL LOW (ref >60)
Glucose, Bld: 134 mg/dL — ABNORMAL HIGH (ref 70–99)
Potassium: 3.7 mmol/L (ref 3.5–5.1)
Sodium: 134 mmol/L — ABNORMAL LOW (ref 135–145)

## 2023-03-13 LAB — HEPARIN LEVEL (UNFRACTIONATED)
Heparin Unfractionated: 0.71 [IU]/mL — ABNORMAL HIGH (ref 0.30–0.70)
Heparin Unfractionated: 0.76 [IU]/mL — ABNORMAL HIGH (ref 0.30–0.70)

## 2023-03-13 LAB — PREPARE RBC (CROSSMATCH)

## 2023-03-13 LAB — CBC
HCT: 24.8 % — ABNORMAL LOW (ref 39.0–52.0)
Hemoglobin: 7.7 g/dL — ABNORMAL LOW (ref 13.0–17.0)
MCH: 30.7 pg (ref 26.0–34.0)
MCHC: 31 g/dL (ref 30.0–36.0)
MCV: 98.8 fL (ref 80.0–100.0)
Platelets: 211 10*3/uL (ref 150–400)
RBC: 2.51 MIL/uL — ABNORMAL LOW (ref 4.22–5.81)
RDW: 14.6 % (ref 11.5–15.5)
WBC: 8.5 10*3/uL (ref 4.0–10.5)
nRBC: 0 % (ref 0.0–0.2)

## 2023-03-13 LAB — PROTIME-INR
INR: 1.5 — ABNORMAL HIGH (ref 0.8–1.2)
Prothrombin Time: 18.2 s — ABNORMAL HIGH (ref 11.4–15.2)

## 2023-03-13 LAB — MAGNESIUM: Magnesium: 2.3 mg/dL (ref 1.7–2.4)

## 2023-03-13 SURGERY — CARDIOVERSION (CATH LAB)
Anesthesia: General

## 2023-03-13 MED ORDER — WARFARIN SODIUM 5 MG PO TABS
5.0000 mg | ORAL_TABLET | Freq: Once | ORAL | Status: AC
  Administered 2023-03-13: 5 mg via ORAL
  Filled 2023-03-13: qty 1

## 2023-03-13 MED ORDER — POTASSIUM CHLORIDE CRYS ER 20 MEQ PO TBCR
40.0000 meq | EXTENDED_RELEASE_TABLET | Freq: Once | ORAL | Status: AC
  Administered 2023-03-13: 40 meq via ORAL
  Filled 2023-03-13: qty 2

## 2023-03-13 MED ORDER — SODIUM CHLORIDE 0.9 % IV SOLN: INTRAVENOUS | Status: DC

## 2023-03-13 MED ORDER — LIDOCAINE 2% (20 MG/ML) 5 ML SYRINGE
INTRAMUSCULAR | Status: DC | PRN
  Administered 2023-03-13: 60 mg via INTRAVENOUS

## 2023-03-13 MED ORDER — PROPOFOL 10 MG/ML IV BOLUS
INTRAVENOUS | Status: DC | PRN
  Administered 2023-03-13: 40 mg via INTRAVENOUS

## 2023-03-13 MED ORDER — SODIUM CHLORIDE 0.9 % IV SOLN
2.0000 g | INTRAVENOUS | Status: DC
  Administered 2023-03-13 – 2023-03-16 (×4): 2 g via INTRAVENOUS
  Filled 2023-03-13 (×5): qty 20

## 2023-03-13 MED ORDER — SODIUM CHLORIDE 0.9% IV SOLUTION: Freq: Once | INTRAVENOUS | Status: DC

## 2023-03-13 MED ORDER — DOXYCYCLINE HYCLATE 100 MG PO TABS
100.0000 mg | ORAL_TABLET | Freq: Two times a day (BID) | ORAL | Status: DC
  Administered 2023-03-13 – 2023-03-17 (×8): 100 mg via ORAL
  Filled 2023-03-13 (×8): qty 1

## 2023-03-13 MED ORDER — PROPOFOL 10 MG/ML IV BOLUS
INTRAVENOUS | Status: DC | PRN
  Administered 2023-03-13: 50 mg via INTRAVENOUS

## 2023-03-13 SURGICAL SUPPLY — 1 items: PAD DEFIB RADIO PHYSIO CONN (PAD) ×1 IMPLANT

## 2023-03-13 NOTE — H&P (View-Only) (Signed)
Rounding Note    Patient Name: Alan Willis Date of Encounter: 03/13/2023  [facility] Cardiologist: [facility]  Subjective   No CP or dyspnea  Inpatient Medications    Scheduled Meds:  [MAR Hold] sodium chloride   Intravenous Once   [MAR Hold] sodium chloride   Intravenous Once   [MAR Hold] Chlorhexidine Gluconate Cloth  6 each Topical Daily   [MAR Hold] dofetilide  250 mcg Oral BID   [MAR Hold] doxycycline  100 mg Oral Q12H   [MAR Hold] feeding supplement  1 Container Oral TID BM   [MAR Hold] furosemide  40 mg Oral BID   [MAR Hold] lidocaine  1 Application Topical Once   [MAR Hold] metoprolol tartrate  25 mg Oral BID   [MAR Hold] pantoprazole (PROTONIX) IV  40 mg Intravenous Q12H   [MAR Hold] phosphorus  500 mg Oral BID   [MAR Hold] warfarin  5 mg Oral ONCE-1600   [MAR Hold] Warfarin - Pharmacist Dosing Inpatient   Does not apply q1600   Continuous Infusions:  [MAR Hold] cefTRIAXone (ROCEPHIN)  IV     heparin 1,700 Units/hr (03/13/23 0706)   [MAR Hold] levETIRAcetam Stopped (03/12/23 1440)   And   [MAR Hold] levETIRAcetam Stopped (03/12/23 2149)   PRN Meds: [MAR Hold] acetaminophen, [MAR Hold] acetaminophen, [MAR Hold] alum & mag hydroxide-simeth, [MAR Hold] bisacodyl, [MAR Hold] fentaNYL (SUBLIMAZE) injection, [MAR Hold] haloperidol lactate, [MAR Hold] magic mouthwash, [MAR Hold] menthol-cetylpyridinium, [MAR Hold] methocarbamol (ROBAXIN) injection, [MAR Hold] naphazoline-glycerin, [MAR Hold] mouth rinse, [MAR Hold] phenol, [MAR Hold] sodium chloride   Vital Signs    Vitals:   03/13/23 0400 03/13/23 0404 03/13/23 0735 03/13/23 0800  BP: (!) 122/49   (!) 92/55  Pulse: 100   (!) 101  Resp: 19   18  Temp:  98 F (36.7 C) 98.3 F (36.8 C)   TempSrc:  Oral Oral   SpO2: 100%   100%  Weight:      Height:        Intake/Output Summary (Last 24 hours) at 03/13/2023 0938 Last data filed at 03/13/2023 0700 Gross per 24 hour  Intake 1025.39 ml  Output  2975 ml  Net -1949.61 ml      03/03/2023    1:20 PM 03/03/2023    8:13 AM 12/19/2022    7:55 AM  Last 3 Weights  Weight (lbs) 174 lb 13.2 oz 175 lb 176 lb 12.8 oz  Weight (kg) 79.3 kg 79.379 kg 80.196 kg      Telemetry    Atrial flutter - Personally Reviewed   Physical Exam   GEN: No acute distress.   Neck: No JVD Cardiac: irregular and tachycardic Respiratory: Clear to auscultation bilaterally. GI: Soft, nontender, non-distended  MS: No edema Neuro:  Nonfocal  Psych: Normal affect   Labs    High Sensitivity Troponin:   Recent Labs  Lab 03/03/23 0843 03/03/23 1048  TROPONINIHS 104* 91*     Chemistry Recent Labs  Lab 03/09/23 0310 03/10/23 0301 03/11/23 0325  NA 134* 130* 132*  K 3.7 3.8 3.8  CL 101 101 100  CO2 23 21* 23  GLUCOSE 107* 102* 115*  BUN 17 17 12   CREATININE 1.14 0.96 1.07  CALCIUM 7.8* 8.0* 8.4*  MG 2.1 2.0 1.9  GFRNONAA >60 >60 >60  ANIONGAP 10 8 9     Hematology Recent Labs  Lab 03/12/23 0251 03/12/23 1642 03/13/23 0541  WBC 9.2 11.8* 8.5  RBC 2.70* 2.96* 2.51*  HGB 8.3* 9.1* 7.7*  HCT 26.4* 28.4* 24.8*  MCV 97.8 95.9 98.8  MCH 30.7 30.7 30.7  MCHC 31.4 32.0 31.0  RDW 14.5 14.5 14.6  PLT 213 243 211    BNP Recent Labs  Lab 03/07/23 1140 03/09/23 0310  BNP 1,402.9* 471.4*      Radiology    CT CARDIAC MORPH/PULM VEIN W/CM&W/O CA SCORE  Addendum Date: 03/12/2023   ADDENDUM REPORT: 03/12/2023 20:14 EXAM: OVER-READ INTERPRETATION  CT CHEST The following report is an over-read performed by radiologist Dr. [facility] on 03/12/2023. This over-read does not include interpretation of cardiac or coronary anatomy or pathology. The coronary CTA interpretation by the cardiologist is attached. COMPARISON:  None. FINDINGS: Mild global cardiomegaly with left atrial enlargement. Extensive calcification of the aortic and mitral valve leaflets. No pericardial effusion. Visualized thoracic aorta is  unremarkable. Linear metallic density within the distal esophagus just proximal to the gastroesophageal junction may represent an endoscopic clip. Subpleural consolidation within the posterior basal left lower lobe may reflect changes of acute lobar pneumonia in the acute setting, resolving pulmonary infarct or pneumonic consolidation is subacute setting, or post inflammatory or post traumatic fibrotic change if chronic. No focal nodules. Trace left pleural effusion. No acute bone abnormality. No lytic or blastic bone lesion. No chest wall mass. IMPRESSION: 1. Mild global cardiomegaly with left atrial enlargement. 2. Subpleural consolidation within the posterior basal left lower lobe may reflect changes of acute lobar pneumonia in the acute setting, resolving pulmonary infarct or pneumonic consolidation is subacute setting, or post inflammatory or post traumatic fibrotic change if chronic. Follow-up imaging in 3 months would be helpful in documenting resolution or stability. 3. Trace left pleural effusion. Aortic Atherosclerosis (ICD10-I70.0). Electronically Signed   By: Helyn Numbers M.D.   On: 03/12/2023 20:14   Result Date: 03/12/2023 CLINICAL DATA:  82M with atrial fibrillation and GI bleed scheduled for DCCV. EXAM: Cardiac CTA TECHNIQUE: A non-contrast, gated CT scan was obtained with axial slices of 3 mm through the heart for calcium scoring. Calcium scoring was performed using the Agatston method. A 120 kV retrospective, gated, contrast cardiac scan was obtained. Gantry rotation speed was 250 msecs and collimation was 0.6 mm. Nitroglycerin was not given. A delayed scan was obtained to exclude left atrial appendage thrombus. The 3D dataset was reconstructed in 5% intervals of the 0-95% of the R-R cycle. Late systolic phases were analyzed on a dedicated workstation using MPR, MIP, and VRT modes. The patient received 80 cc of contrast. FINDINGS: Image quality: Excellent. Noise artifact is: Limited. Pulmonary  Veins: There is normal pulmonary vein drainage into the left atrium (2 on the right and 2 on the left). RUPV: 26.6 x 22.1 mm RLPV: 27.7 x 25.3 mm LUPV: 19.5 x 17.4 mm LLPV: 23.6 x 21.2 mm Left Atrium: The left atrial size is normal. There is no PFO/ASD. The left atrial appendage is large broccoli type without thrombus on contrast or delayed imaging. The esophagus runs in the left atrial midline and is not in proximity to any of the pulmonary vein ostia. Coronary Arteries: CAC score of 588, which is 66th percentile for age-, race-, and sex-matched controls. Normal coronary origin. Right dominance. The study was performed without use of NTG and is insufficient for plaque evaluation. Right Atrium: Right atrial size is within normal limits. Right Ventricle: The right ventricular cavity is within normal limits. Left Ventricle: The ventricular cavity size is within normal limits. Pericardium: Normal thickness without significant  effusion or calcium present. Pulmonary Artery: Dilated. 3.9 cm. Consistent with pulmonary hypertension. Cardiac valves: The aortic valve is trileaflet without significant calcification. The mitral valve is a normal structure without significant calcification. Aorta: Normal caliber with no significant disease. Mechanical aortic valve. Mitral annular calcification. Extra-cardiac findings: See attached radiology report for non-cardiac structures. IMPRESSION: 1. There is normal pulmonary vein drainage into the left atrium with ostial measurements above. 2. There is no thrombus in the left atrial appendage. 3. The esophagus runs in the left atrial midline and is not in proximity to any of the pulmonary vein ostia. 4. No PFO/ASD. 5. Normal coronary origin. Right dominance. 6. CAC score of 588 which is 66th percentile for age-, race-, and sex-matched controls. 7.  Dilated pulmonary artery consistent with pulmonary hypertension. Chilton Si, MD Electronically Signed: By: Chilton Si M.D. On:  03/12/2023 16:15   CT HEMATURIA WORKUP  Result Date: 03/12/2023 CLINICAL DATA:  Hematuria. EXAM: CT ABDOMEN AND PELVIS WITHOUT AND WITH CONTRAST TECHNIQUE: Multidetector CT imaging of the abdomen and pelvis was performed following the standard protocol before and following the bolus administration of intravenous contrast. RADIATION DOSE REDUCTION: This exam was performed according to the departmental dose-optimization program which includes automated exposure control, adjustment of the mA and/or kV according to patient size and/or use of iterative reconstruction technique. CONTRAST:  OMNIPAQUE IOHEXOL 350 MG/ML SOLN COMPARISON:  Noncontrast abdominopelvic CT 03/03/2023. CT the abdomen and pelvis with contrast 04/14/2020. FINDINGS: Lower chest: Interval development of left lower lobe airspace disease with associated air bronchograms, suspicious for pneumonia. Trace left pleural effusion. Clear right lung base. Aortic and coronary artery atherosclerosis status post median sternotomy and aortic valve replacement. Hepatobiliary: The liver is normal in density without suspicious focal abnormality. Calcified gallstone without residual significant gallbladder distension, wall thickening or surrounding inflammation. No biliary ductal dilatation. Pancreas: Unremarkable. No pancreatic ductal dilatation or surrounding inflammatory changes. Spleen: Normal in size without focal abnormality. Adrenals/Urinary Tract: Both adrenal glands appear normal. Pre-contrast images demonstrate no renal, ureteral or bladder calculi. Post-contrast, both kidneys enhance normally. There is no evidence of enhancing renal mass. Simple renal cysts bilaterally, measuring up to 2.6 cm on the right and up to 2.7 cm on the left. No specific follow-up imaging recommended. Delayed images result in segmental visualization of the ureters. No focal upper tract urothelial abnormalities are identified. Possible mild dependent debris in the bladder  lumen without evidence of focal mucosal lesion or bladder wall thickening. Stomach/Bowel: No enteric contrast administered. New linear intraluminal metallic foreign body at the gastroesophageal junction, measuring approximately 1.7 cm in length, presumably a hemostatic clip placed at endoscopy 03/05/2023. The stomach otherwise appears unremarkable for its degree of distention. No residual small bowel distension, wall thickening or surrounding inflammation. The appendix appears normal. Distal small bowel extends into a right inguinal hernia. No evidence of incarceration. Mildly prominent stool in the colon. Vascular/Lymphatic: There are no enlarged abdominal or pelvic lymph nodes. Aortic and branch vessel atherosclerosis without evidence of aneurysm or large vessel occlusion. Left-sided varicocele noted. Reproductive: The prostate gland and seminal vesicles appear unremarkable. Other: As above, stable right inguinal hernia containing small bowel. Previous right inguinal herniorrhaphy. No ascites or pneumoperitoneum. Musculoskeletal: No acute or significant osseous findings. Stable relatively mild multilevel spondylosis. Unless specific follow-up recommendations are mentioned in the findings or impression sections, no imaging follow-up of any mentioned incidental findings is recommended. IMPRESSION: 1. No acute findings or explanation for the patient's symptoms. No evidence of urinary tract calculus,  renal mass or focal urothelial lesion. 2. Possible mild dependent debris in the bladder lumen. Cystoscopy may be warranted if the patient has persistent unexplained hematuria. 3. Interval development of left lower lobe airspace disease with associated air bronchograms, suspicious for pneumonia. Trace left pleural effusion. Recommend radiographic follow-up. 4. Stable right inguinal hernia containing small bowel without evidence of incarceration or obstruction. Previously demonstrated small bowel distension has resolved. 5.  Cholelithiasis without evidence of acute cholecystitis. 6.  Aortic Atherosclerosis (ICD10-I70.0). Electronically Signed   By: Carey Bullocks M.D.   On: 03/12/2023 16:32     Patient Profile     QUINTARIUS DRYSDALE is a 77 y.o. 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 Cardiology was consulted on 03/04/2023 for further evaluation of atrial flutter with RVR.   Assessment & Plan    1 Atrial flutter-continue tikosyn, metoprolol and coumadin/heparin. CTA showed no LAA thrombus; for DCCV today. Recheck bmet and mg in AM. INR will need to be above 2 prior to DCing heparin.  2 Mechanical AVR-continue heparin and coumadin.   3 hematuria-FU urology as outpt.  4 Recent GI bleed-s/p transfusion, clipping of MW tears; also with gastric ulcers; continue to follow Hgb.  5 abnormal chest CT-needs fu study 3 months.  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  For questions or updates, please contact [facility] Please consult [email or web address] for contact info under        Signed, Olga Millers, MD  03/13/2023, 9:38 AM

## 2023-03-13 NOTE — Anesthesia Postprocedure Evaluation (Signed)
Anesthesia Post Note  Patient: Alan Willis  Procedure(s) Performed: CARDIOVERSION     Patient location during evaluation: Cath Lab Anesthesia Type: General Level of consciousness: awake and alert Pain management: pain level controlled Vital Signs Assessment: post-procedure vital signs reviewed and stable Respiratory status: spontaneous breathing, nonlabored ventilation and respiratory function stable Cardiovascular status: blood pressure returned to baseline and stable Postop Assessment: no apparent nausea or vomiting Anesthetic complications: no   There were no known notable events for this encounter.  Last Vitals:  Vitals:   03/13/23 1130 03/13/23 1140  BP: 132/69 (!) 146/125  Pulse: (!) 101 (!) 106  Resp: 19 (!) 21  Temp:    SpO2: 98% 100%    Last Pain:  Vitals:   03/13/23 1119  TempSrc: Temporal  PainSc: 0-No pain                 Delray Reza,W. EDMOND

## 2023-03-13 NOTE — Anesthesia Preprocedure Evaluation (Signed)
Anesthesia Evaluation  Patient identified by MRN, date of birth, ID band Patient awake    Reviewed: Allergy & Precautions, H&P , NPO status , Patient's Chart, lab work & pertinent test results  Airway Mallampati: II  TM Distance: >3 FB Neck ROM: Full    Dental no notable dental hx. (+) Teeth Intact, Dental Advisory Given   Pulmonary neg pulmonary ROS, Current Smoker   Pulmonary exam normal breath sounds clear to auscultation       Cardiovascular +CHF   Rhythm:Irregular Rate:Tachycardia     Neuro/Psych Seizures -, Well Controlled,   negative psych ROS   GI/Hepatic negative GI ROS, Neg liver ROS,,,  Endo/Other  negative endocrine ROS    Renal/GU Renal disease  negative genitourinary   Musculoskeletal   Abdominal   Peds  Hematology negative hematology ROS (+)   Anesthesia Other Findings   Reproductive/Obstetrics negative OB ROS                             Anesthesia Physical Anesthesia Plan  ASA: 3  Anesthesia Plan: General   Post-op Pain Management: Minimal or no pain anticipated   Induction: Intravenous  PONV Risk Score and Plan: 1 and Propofol infusion  Airway Management Planned: Natural Airway and Nasal Cannula  Additional Equipment:   Intra-op Plan:   Post-operative Plan:   Informed Consent: I have reviewed the patients History and Physical, chart, labs and discussed the procedure including the risks, benefits and alternatives for the proposed anesthesia with the patient or authorized representative who has indicated his/her understanding and acceptance.     Dental advisory given  Plan Discussed with: CRNA  Anesthesia Plan Comments:        Anesthesia Quick Evaluation

## 2023-03-13 NOTE — CV Procedure (Addendum)
Procedure: Electrical Cardioversion Indications:  Atrial Flutter  Procedure Details:  Consent: Risks of procedure as well as the alternatives and risks of each were explained to the (patient/caregiver).  Consent for procedure obtained.  Time Out: Verified patient identification, verified procedure, site/side was marked, verified correct patient position, special equipment/implants available, medications/allergies/relevent history reviewed, required imaging and test results available. PERFORMED.  Patient placed on cardiac monitor, pulse oximetry, supplemental oxygen as necessary.  Sedation given:  Propofol Pacer pads placed anterior and posterior chest.  Cardioverted 2 time(s).  Cardioversion with synchronized biphasic 360J shock.  Evaluation: Findings: Post procedure EKG shows: NSR, sinus with PACs; then back to atrial flutter Complications: None Patient did tolerate procedure well.  Time Spent Directly with the Patient:  15 minutes   [name] 03/13/2023, 11:01 AM

## 2023-03-13 NOTE — Progress Notes (Signed)
PHARMACY - ANTICOAGULATION CONSULT NOTE  Pharmacy Consult for heparin, warfarin bridge  Indication:  atrial fibrillation, mechanical AVR  Allergies  Allergen Reactions   Amoxicillin-Pot Clavulanate Rash   Codeine Other (See Comments)    Stomach upset    Patient Measurements: Height: 5\' 10"  (177.8 cm) Weight: 79.3 kg (174 lb 13.2 oz) IBW/kg (Calculated) : 73 Heparin Dosing Weight: TBW  Vital Signs: Temp: 97.9 F (36.6 C) (11/27 1620) Temp Source: Oral (11/27 1620) BP: 104/71 (11/27 1600) Pulse Rate: 100 (11/27 1700)  Labs: Recent Labs    03/11/23 0325 03/11/23 1150 03/12/23 0251 03/12/23 1642 03/13/23 0535 03/13/23 0541 03/13/23 1638  HGB 8.5*   < > 8.3* 9.1*  --  7.7*  --   HCT 26.4*   < > 26.4* 28.4*  --  24.8*  --   PLT 191  --  213 243  --  211  --   LABPROT 15.4*  --  17.1*  --   --  18.2*  --   INR 1.2  --  1.4*  --   --  1.5*  --   HEPARINUNFRC 0.70   < > 0.63  --  0.71*  --  0.76*  CREATININE 1.07  --   --   --   --   --   --    < > = values in this interval not displayed.    Estimated Creatinine Clearance: 60.6 mL/min (by C-G formula based on SCr of 1.07 mg/dL).   Medications: Infusions:   cefTRIAXone (ROCEPHIN)  IV Stopped (03/13/23 1734)   heparin 1,700 Units/hr (03/13/23 1725)   levETIRAcetam Stopped (03/13/23 1640)   And   levETIRAcetam Stopped (03/12/23 2149)     Assessment: 77 YO male presenting with a SBO and hematemesis. Patient has a significant cardiac history of atrial fibrillation (s/p TEE guided DCCV 07/2019) and AVR  [facility] 1983) on chronic warfarin anticoagulation. Last dose of warfarin 11/16 @0800 . INR was 2.9 on admission and increased up to 4.1 on 11/17 -  he was given IV vitamin K 10mg  x1 on 11/18 to get INR <2 for EGD 11/19 which showed 2 large Mallory-Weiss tear with clots--3 endoclips have been placed to close mucosal defect, superficial fundic ulcers. Pharmacy consulted for heparin dosing on 11/19 with plan to restart  warfarin in 3 days per GI on 11/22. Will give first dose of warfarin today.  PTA warfarin regimen: 6mg  daily except 3mg  on Tuesdays.  (goal INR 2.5-3.5 per outpatient notes)  Today, 03/13/23 Most recent heparin level remains elevated and now higher, despite reduction in rate to 1700 units/hr Hgb down to 7.7, plts WNL INR 1.5 this AM SCr noted to have increased since 11/25 - could explain rising heparin levels  No bleeding noted  Goal of Therapy:  INR 2-3 Heparin level 0.3-0.7 units/ml Monitor platelets by anticoagulation protocol: Yes   Plan:  Decrease heparin drip to 1400 units/hr Repeat heparin level in 8 hours Warfarin dose already given today Daily heparin level, INR, CBC Monitor for s/sx of bleeding  Continue heparin gtt until INR is within goal range on warfarin.   Bernadene Person, PharmD, BCPS [phone] 03/13/2023, 6:31 PM

## 2023-03-13 NOTE — Progress Notes (Addendum)
[facility]                                                                               Alan Willis, is a 77 y.o. 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   On admission required pressors due to hemorrhagic shock and Afib.  Transferred to ICU and required vasopressors. EGD on 11/19 showed bleeding likely from [facility] tear. Heparin resumed. AKI resolved. Cards, General surgery and GI on board.  Transferred to [facility] 03/06/2023.    Assessment & Plan    Assessment and Plan:   Hemorrhagic shock secondary to acute blood loss secondary to upper GI bleed/Mallory-Weiss tear S/p 3 units of PRBC transfusion on 03/05/2023 S/p 3 units of fresh frozen plasma on 03/04/2023 S/p EGD showing 2 large nonbleeding Mallory-Weiss tear with stigmata of recent bleeding and 2 large clots at side s/p clips x 3. He was also found to have 2 nonbleeding linear and superficial gastric ulcers with a clean ulcer base in the gastric fundus. Gastroenterology recommends PPI twice daily for 2 months followed by daily PPI. He was cleared by gastroenterology to restart anticoagulation.   Acute anemia of blood loss Hemoglobin dropped to 7.7 from 9.1. Ordered another unit of blood today. Recheck H&H post transfusion.   Hematuria Appears to have resolved. Urology on board, suspect from catheter removal/trauma.   Small bowel obstruction: Surgery managing.  SBO appears to have resolved.  Tube removed 03/05/2023, SBO resolved.  Tolerating regular diet.   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 Cardiology on  board and plan for cardioversion today CTA is negative for left atrial appendage thrombus Continue with metoprolol 25 mg BID, Tikosyn 250 mcg BID.  Check BMP and magnesium level today.  Keep K >4 and magnesium>2, check EKG in am.  INR is still at 1.5   Acute hypoxic respiratory failure secondary to acute on chronic systolic  congestive heart failure  Appears to have resolved. Continue with Lasix 40mg  BID. Continue with strict intake and output.  Daily weights.  Diuresed about neg net 2.2 lit overnight.     Seizures Continue with Keppra  Delirium  Resolved. He is alert and oriented.    Hypophosphatemia Replaced.    Acute hyponatremia Much improved.  Sodium is 132 and stable.     Estimated body mass index is 25.08 kg/m as calculated from the following:   Height as of this encounter: 5\' 10"  (1.778 m).   Weight as of this encounter: 79.3 kg.  Code Status: full code.  DVT Prophylaxis:  SCDs Start: 03/03/23 1402 warfarin (COUMADIN) tablet 5 mg   Level of Care: Level of care: Stepdown Family Communication: Updated patient's family at bedside.   Disposition Plan:     Remains inpatient appropriate:  DCCV today.   Procedures:  DCCV today.   Consultants:   Urology Cardiology Gastroenterology.  Antimicrobials:   Anti-infectives (From admission, onward)    Start     Dose/Rate Route Frequency Ordered Stop   03/13/23 1000  cefTRIAXone (ROCEPHIN) 2 g in sodium chloride 0.9 % 100 mL IVPB        2 g 200 mL/hr over 30 Minutes Intravenous Every 24 hours 03/13/23 0854     03/13/23 1000  doxycycline (VIBRA-TABS) tablet 100 mg        100 mg Oral Every 12 hours 03/13/23 0854          Medications  Scheduled Meds:  sodium chloride   Intravenous Once   Chlorhexidine Gluconate Cloth  6 each Topical Daily   dofetilide  250 mcg Oral BID   doxycycline  100 mg Oral Q12H   feeding supplement  1 Container Oral TID BM   furosemide  40 mg Oral BID   lidocaine  1 Application  Topical Once   metoprolol tartrate  25 mg Oral BID   pantoprazole (PROTONIX) IV  40 mg Intravenous Q12H   phosphorus  500 mg Oral BID   warfarin  5 mg Oral ONCE-1600   Warfarin - Pharmacist Dosing Inpatient   Does not apply q1600   Continuous Infusions:  cefTRIAXone (ROCEPHIN)  IV     heparin 1,700 Units/hr (03/13/23 0706)   levETIRAcetam Stopped (03/12/23 1440)   And   levETIRAcetam Stopped (03/12/23 2149)   PRN Meds:.acetaminophen, acetaminophen, alum & mag hydroxide-simeth, bisacodyl, fentaNYL (SUBLIMAZE) injection, haloperidol lactate, magic mouthwash, menthol-cetylpyridinium, methocarbamol (ROBAXIN) injection, naphazoline-glycerin, mouth rinse, phenol, sodium chloride    Subjective:   Alan Willis was seen and examined today.  No new complaints. No nausea, vomiting or abdominal pain.  No bleeding in the urine.   Objective:   Vitals:   03/13/23 0400 03/13/23 0404 03/13/23 0735 03/13/23 0800  BP: (!) 122/49   (!) 92/55  Pulse: 100   (!) 101  Resp: 19   18  Temp:  98 F (36.7 C) 98.3 F (36.8 C)   TempSrc:  Oral Oral   SpO2: 100%   100%  Weight:      Height:        Intake/Output Summary (Last 24 hours) at 03/13/2023 0906 Last data filed at 03/13/2023 0700 Gross per 24 hour  Intake 1025.39 ml  Output 3085 ml  Net -2059.61 ml   Filed Weights   03/03/23 0813 03/03/23 1320  Weight: 79.4 kg 79.3 kg     Exam General: Alert and oriented x 3, NAD Cardiovascular: S1 S2 auscultated, no murmurs, irregularly irregular.  Respiratory: Clear to auscultation bilaterally, no wheezing, rales or rhonchi Gastrointestinal: Soft, nontender, nondistended, + bowel sounds Ext: no pedal edema bilaterally Neuro: AAOx3,  Skin: No rashes Psych: Normal affect and demeanor, alert and oriented x3    Data Reviewed:  I have personally reviewed following labs and imaging studies   CBC Lab Results  Component Value Date   WBC 8.5 03/13/2023   RBC 2.51 (L) 03/13/2023   HGB 7.7  (L) 03/13/2023   HCT 24.8 (L) 03/13/2023   MCV 98.8 03/13/2023   MCH 30.7 03/13/2023   PLT 211 03/13/2023   MCHC 31.0 03/13/2023   RDW 14.6 03/13/2023   LYMPHSABS 0.9 03/03/2023   MONOABS 2.4 (H) 03/03/2023   EOSABS 0.0 03/03/2023   BASOSABS 0.0 03/03/2023     Last metabolic panel Lab Results  Component Value Date  NA 132 (L) 03/11/2023   K 3.8 03/11/2023   CL 100 03/11/2023   CO2 23 03/11/2023   BUN 12 03/11/2023   CREATININE 1.07 03/11/2023   GLUCOSE 115 (H) 03/11/2023   GFRNONAA >60 03/11/2023   GFRAA  12/29/2009    >60        The eGFR has been calculated using the MDRD equation. This calculation has not been validated in all clinical situations. eGFR's persistently <60 mL/min signify possible Chronic Kidney Disease.   CALCIUM 8.4 (L) 03/11/2023   PHOS 2.6 03/09/2023   PROT 5.6 (L) 03/04/2023   ALBUMIN 3.6 03/04/2023   BILITOT 2.3 (H) 03/04/2023   ALKPHOS 30 (L) 03/04/2023   AST 26 03/04/2023   ALT 13 03/04/2023   ANIONGAP 9 03/11/2023    CBG (last 3)  No results for input(s): "GLUCAP" in the last 72 hours.    Coagulation Profile: Recent Labs  Lab 03/09/23 0310 03/10/23 0301 03/11/23 0325 03/12/23 0251 03/13/23 0541  INR 1.1 1.1 1.2 1.4* 1.5*     Radiology Studies: CT CARDIAC MORPH/PULM VEIN W/CM&W/O CA SCORE  Addendum Date: 03/12/2023   ADDENDUM REPORT: 03/12/2023 20:14 EXAM: OVER-READ INTERPRETATION  CT CHEST The following report is an over-read performed by radiologist Dr. [facility] on 03/12/2023. This over-read does not include interpretation of cardiac or coronary anatomy or pathology. The coronary CTA interpretation by the cardiologist is attached. COMPARISON:  None. FINDINGS: Mild global cardiomegaly with left atrial enlargement. Extensive calcification of the aortic and mitral valve leaflets. No pericardial effusion. Visualized thoracic aorta is unremarkable. Linear metallic density within the distal  esophagus just proximal to the gastroesophageal junction may represent an endoscopic clip. Subpleural consolidation within the posterior basal left lower lobe may reflect changes of acute lobar pneumonia in the acute setting, resolving pulmonary infarct or pneumonic consolidation is subacute setting, or post inflammatory or post traumatic fibrotic change if chronic. No focal nodules. Trace left pleural effusion. No acute bone abnormality. No lytic or blastic bone lesion. No chest wall mass. IMPRESSION: 1. Mild global cardiomegaly with left atrial enlargement. 2. Subpleural consolidation within the posterior basal left lower lobe may reflect changes of acute lobar pneumonia in the acute setting, resolving pulmonary infarct or pneumonic consolidation is subacute setting, or post inflammatory or post traumatic fibrotic change if chronic. Follow-up imaging in 3 months would be helpful in documenting resolution or stability. 3. Trace left pleural effusion. Aortic Atherosclerosis (ICD10-I70.0). Electronically Signed   By: Helyn Numbers M.D.   On: 03/12/2023 20:14   Result Date: 03/12/2023 CLINICAL DATA:  31M with atrial fibrillation and GI bleed scheduled for DCCV. EXAM: Cardiac CTA TECHNIQUE: A non-contrast, gated CT scan was obtained with axial slices of 3 mm through the heart for calcium scoring. Calcium scoring was performed using the Agatston method. A 120 kV retrospective, gated, contrast cardiac scan was obtained. Gantry rotation speed was 250 msecs and collimation was 0.6 mm. Nitroglycerin was not given. A delayed scan was obtained to exclude left atrial appendage thrombus. The 3D dataset was reconstructed in 5% intervals of the 0-95% of the R-R cycle. Late systolic phases were analyzed on a dedicated workstation using MPR, MIP, and VRT modes. The patient received 80 cc of contrast. FINDINGS: Image quality: Excellent. Noise artifact is: Limited. Pulmonary Veins: There is normal pulmonary vein drainage into the  left atrium (2 on the right and 2 on the left). RUPV: 26.6 x 22.1 mm RLPV: 27.7 x 25.3 mm LUPV: 19.5 x  17.4 mm LLPV: 23.6 x 21.2 mm Left Atrium: The left atrial size is normal. There is no PFO/ASD. The left atrial appendage is large broccoli type without thrombus on contrast or delayed imaging. The esophagus runs in the left atrial midline and is not in proximity to any of the pulmonary vein ostia. Coronary Arteries: CAC score of 588, which is 66th percentile for age-, race-, and sex-matched controls. Normal coronary origin. Right dominance. The study was performed without use of NTG and is insufficient for plaque evaluation. Right Atrium: Right atrial size is within normal limits. Right Ventricle: The right ventricular cavity is within normal limits. Left Ventricle: The ventricular cavity size is within normal limits. Pericardium: Normal thickness without significant effusion or calcium present. Pulmonary Artery: Dilated. 3.9 cm. Consistent with pulmonary hypertension. Cardiac valves: The aortic valve is trileaflet without significant calcification. The mitral valve is a normal structure without significant calcification. Aorta: Normal caliber with no significant disease. Mechanical aortic valve. Mitral annular calcification. Extra-cardiac findings: See attached radiology report for non-cardiac structures. IMPRESSION: 1. There is normal pulmonary vein drainage into the left atrium with ostial measurements above. 2. There is no thrombus in the left atrial appendage. 3. The esophagus runs in the left atrial midline and is not in proximity to any of the pulmonary vein ostia. 4. No PFO/ASD. 5. Normal coronary origin. Right dominance. 6. CAC score of 588 which is 66th percentile for age-, race-, and sex-matched controls. 7.  Dilated pulmonary artery consistent with pulmonary hypertension. Chilton Si, MD Electronically Signed: By: Chilton Si M.D. On: 03/12/2023 16:15   CT HEMATURIA WORKUP  Result Date:  03/12/2023 CLINICAL DATA:  Hematuria. EXAM: CT ABDOMEN AND PELVIS WITHOUT AND WITH CONTRAST TECHNIQUE: Multidetector CT imaging of the abdomen and pelvis was performed following the standard protocol before and following the bolus administration of intravenous contrast. RADIATION DOSE REDUCTION: This exam was performed according to the departmental dose-optimization program which includes automated exposure control, adjustment of the mA and/or kV according to patient size and/or use of iterative reconstruction technique. CONTRAST:  OMNIPAQUE IOHEXOL 350 MG/ML SOLN COMPARISON:  Noncontrast abdominopelvic CT 03/03/2023. CT the abdomen and pelvis with contrast 04/14/2020. FINDINGS: Lower chest: Interval development of left lower lobe airspace disease with associated air bronchograms, suspicious for pneumonia. Trace left pleural effusion. Clear right lung base. Aortic and coronary artery atherosclerosis status post median sternotomy and aortic valve replacement. Hepatobiliary: The liver is normal in density without suspicious focal abnormality. Calcified gallstone without residual significant gallbladder distension, wall thickening or surrounding inflammation. No biliary ductal dilatation. Pancreas: Unremarkable. No pancreatic ductal dilatation or surrounding inflammatory changes. Spleen: Normal in size without focal abnormality. Adrenals/Urinary Tract: Both adrenal glands appear normal. Pre-contrast images demonstrate no renal, ureteral or bladder calculi. Post-contrast, both kidneys enhance normally. There is no evidence of enhancing renal mass. Simple renal cysts bilaterally, measuring up to 2.6 cm on the right and up to 2.7 cm on the left. No specific follow-up imaging recommended. Delayed images result in segmental visualization of the ureters. No focal upper tract urothelial abnormalities are identified. Possible mild dependent debris in the bladder lumen without evidence of focal mucosal lesion or bladder  wall thickening. Stomach/Bowel: No enteric contrast administered. New linear intraluminal metallic foreign body at the gastroesophageal junction, measuring approximately 1.7 cm in length, presumably a hemostatic clip placed at endoscopy 03/05/2023. The stomach otherwise appears unremarkable for its degree of distention. No residual small bowel distension, wall thickening or surrounding inflammation. The appendix appears normal.  Distal small bowel extends into a right inguinal hernia. No evidence of incarceration. Mildly prominent stool in the colon. Vascular/Lymphatic: There are no enlarged abdominal or pelvic lymph nodes. Aortic and branch vessel atherosclerosis without evidence of aneurysm or large vessel occlusion. Left-sided varicocele noted. Reproductive: The prostate gland and seminal vesicles appear unremarkable. Other: As above, stable right inguinal hernia containing small bowel. Previous right inguinal herniorrhaphy. No ascites or pneumoperitoneum. Musculoskeletal: No acute or significant osseous findings. Stable relatively mild multilevel spondylosis. Unless specific follow-up recommendations are mentioned in the findings or impression sections, no imaging follow-up of any mentioned incidental findings is recommended. IMPRESSION: 1. No acute findings or explanation for the patient's symptoms. No evidence of urinary tract calculus, renal mass or focal urothelial lesion. 2. Possible mild dependent debris in the bladder lumen. Cystoscopy may be warranted if the patient has persistent unexplained hematuria. 3. Interval development of left lower lobe airspace disease with associated air bronchograms, suspicious for pneumonia. Trace left pleural effusion. Recommend radiographic follow-up. 4. Stable right inguinal hernia containing small bowel without evidence of incarceration or obstruction. Previously demonstrated small bowel distension has resolved. 5. Cholelithiasis without evidence of acute cholecystitis.  6.  Aortic Atherosclerosis (ICD10-I70.0). Electronically Signed   By: Carey Bullocks M.D.   On: 03/12/2023 16:32       Kathlen Mody M.D. [facility] 03/13/2023, 9:06 AM  Available via Epic secure chat 7am-7pm After 7 pm, please refer to night coverage provider listed on amion.

## 2023-03-13 NOTE — Plan of Care (Signed)
  Problem: Clinical Measurements: Goal: Ability to maintain clinical measurements within normal limits will improve Outcome: Progressing Goal: Will remain free from infection Outcome: Progressing Goal: Diagnostic test results will improve Outcome: Progressing Goal: Respiratory complications will improve Outcome: Progressing Goal: Cardiovascular complication will be avoided Outcome: Progressing   Problem: Activity: Goal: Risk for activity intolerance will decrease Outcome: Progressing   Problem: Nutrition: Goal: Adequate nutrition will be maintained Outcome: Progressing   Problem: Nutrition: Goal: Adequate nutrition will be maintained Outcome: Progressing   Problem: Elimination: Goal: Will not experience complications related to bowel motility Outcome: Progressing Goal: Will not experience complications related to urinary retention Outcome: Progressing   Problem: Pain Management: Goal: General experience of comfort will improve Outcome: Progressing

## 2023-03-13 NOTE — Progress Notes (Signed)
PHARMACY - ANTICOAGULATION CONSULT NOTE  Pharmacy Consult for heparin, warfarin bridge  Indication:  atrial fibrillation, mechanical AVR  Allergies  Allergen Reactions   Amoxicillin-Pot Clavulanate Rash   Codeine Other (See Comments)    Stomach upset    Patient Measurements: Height: 5\' 10"  (177.8 cm) Weight: 79.3 kg (174 lb 13.2 oz) IBW/kg (Calculated) : 73 Heparin Dosing Weight: TBW  Vital Signs: Temp: 98 F (36.7 C) (11/27 0404) Temp Source: Oral (11/27 0404) BP: 122/49 (11/27 0400) Pulse Rate: 100 (11/27 0400)  Labs: Recent Labs    03/11/23 0325 03/11/23 1150 03/11/23 1646 03/12/23 0251 03/12/23 1642 03/13/23 0535 03/13/23 0541  HGB 8.5*   < >  --  8.3* 9.1*  --  7.7*  HCT 26.4*   < >  --  26.4* 28.4*  --  24.8*  PLT 191  --   --  213 243  --  211  LABPROT 15.4*  --   --  17.1*  --   --  18.2*  INR 1.2  --   --  1.4*  --   --  1.5*  HEPARINUNFRC 0.70  --  0.66 0.63  --  0.71*  --   CREATININE 1.07  --   --   --   --   --   --    < > = values in this interval not displayed.    Estimated Creatinine Clearance: 60.6 mL/min (by C-G formula based on SCr of 1.07 mg/dL).   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 Patient has a significant cardiac history of atrial fibrillation (s/p TEE  guided DCCV 07/2019) and AVR  [facility] 1983) on chronic warfarin anticoagulation. Last dose of warfarin 11/16 @0800 . INR was 2.9 on admission and increased up to 4.1 on 11/17 -  he was given IV vitamin K 10mg  x1 on 11/18 to get INR <2 for EGD 11/19 which showed 2 large Mallory-Weiss tear with clots--3 endoclips have been placed to close mucosal defect, superficial fundic ulcers. Pharmacy consulted for heparin dosing on 11/19 with plan to restart warfarin in 3 days per GI on 11/22. Will give first dose of warfarin today.  PTA warfarin regimen: 6mg  daily except 3mg  on Tuesdays.  (goal INR 2.5-3.5 per outpatient notes)  Today, 03/13/23 HL 0.71 slightly supra-therapeutic on 1800 units/hr Hgb down to 7.7, plts WNL INR 1.5 No bleeding noted  Goal of Therapy:  INR 2-3 Heparin level 0.3-0.7 units/ml Monitor platelets by anticoagulation protocol: Yes   Plan:  Decrease heparin drip to 1700 units/hr Heparin level in 8 hours Warfarin 5mg  x1 tonight Daily heparin level, INR, CBC Monitor for s/sx of bleeding  Continue heparin gtt until INR is within goal range on warfarin.   Arley Phenix RPh 03/13/2023, 6:15 AM

## 2023-03-13 NOTE — Transfer of Care (Signed)
Immediate Anesthesia Transfer of Care Note  Patient: Alan Willis  Procedure(s) Performed: CARDIOVERSION  Patient Location: PACU and cathe lab  Anesthesia Type:General  Level of Consciousness: sedated  Airway & Oxygen Therapy: Patient Spontanous Breathing  Post-op Assessment: Report given to RN and Post -op Vital signs reviewed and stable  Post vital signs: stable  Last Vitals:  Vitals Value Taken Time  BP    Temp    Pulse    Resp    SpO2      Last Pain:  Vitals:   03/13/23 1119  TempSrc: Temporal  PainSc: 0-No pain      Patients Stated Pain Goal: 0 (03/10/23 1600)  Complications: There were no known notable events for this encounter.

## 2023-03-13 NOTE — Anesthesia Postprocedure Evaluation (Signed)
Anesthesia Post Note  Patient: Alan Willis  Procedure(s) Performed: CARDIOVERSION     Patient location during evaluation: Cath Lab Anesthesia Type: General Level of consciousness: awake and alert Pain management: pain level controlled Vital Signs Assessment: post-procedure vital signs reviewed and stable Respiratory status: spontaneous breathing, nonlabored ventilation and respiratory function stable Cardiovascular status: blood pressure returned to baseline and stable Postop Assessment: no apparent nausea or vomiting Anesthetic complications: no  There were no known notable events for this encounter.  Last Vitals:  Vitals:   03/13/23 1440 03/13/23 1450  BP: 133/87 111/82  Pulse: 68 64  Resp: 19 18  Temp:    SpO2: 97% 96%    Last Pain:  Vitals:   03/13/23 1430  TempSrc: Temporal  PainSc: 0-No pain                 Srihaan Mastrangelo,W. EDMOND

## 2023-03-13 NOTE — Progress Notes (Signed)
Rounding Note    Patient Name: Alan Willis Date of Encounter: 03/13/2023  [facility] Cardiologist: [facility]  Subjective   No CP or dyspnea  Inpatient Medications    Scheduled Meds:  [MAR Hold] sodium chloride   Intravenous Once   [MAR Hold] sodium chloride   Intravenous Once   [MAR Hold] Chlorhexidine Gluconate Cloth  6 each Topical Daily   [MAR Hold] dofetilide  250 mcg Oral BID   [MAR Hold] doxycycline  100 mg Oral Q12H   [MAR Hold] feeding supplement  1 Container Oral TID BM   [MAR Hold] furosemide  40 mg Oral BID   [MAR Hold] lidocaine  1 Application Topical Once   [MAR Hold] metoprolol tartrate  25 mg Oral BID   [MAR Hold] pantoprazole (PROTONIX) IV  40 mg Intravenous Q12H   [MAR Hold] phosphorus  500 mg Oral BID   [MAR Hold] warfarin  5 mg Oral ONCE-1600   [MAR Hold] Warfarin - Pharmacist Dosing Inpatient   Does not apply q1600   Continuous Infusions:  [MAR Hold] cefTRIAXone (ROCEPHIN)  IV     heparin 1,700 Units/hr (03/13/23 0706)   [MAR Hold] levETIRAcetam Stopped (03/12/23 1440)   And   [MAR Hold] levETIRAcetam Stopped (03/12/23 2149)   PRN Meds: [MAR Hold] acetaminophen, [MAR Hold] acetaminophen, [MAR Hold] alum & mag hydroxide-simeth, [MAR Hold] bisacodyl, [MAR Hold] fentaNYL (SUBLIMAZE) injection, [MAR Hold] haloperidol lactate, [MAR Hold] magic mouthwash, [MAR Hold] menthol-cetylpyridinium, [MAR Hold] methocarbamol (ROBAXIN) injection, [MAR Hold] naphazoline-glycerin, [MAR Hold] mouth rinse, [MAR Hold] phenol, [MAR Hold] sodium chloride   Vital Signs    Vitals:   03/13/23 0400 03/13/23 0404 03/13/23 0735 03/13/23 0800  BP: (!) 122/49   (!) 92/55  Pulse: 100   (!) 101  Resp: 19   18  Temp:  98 F (36.7 C) 98.3 F (36.8 C)   TempSrc:  Oral Oral   SpO2: 100%   100%  Weight:      Height:        Intake/Output Summary (Last 24 hours) at 03/13/2023 0938 Last data filed at 03/13/2023 0700 Gross per 24 hour  Intake 1025.39 ml  Output  2975 ml  Net -1949.61 ml      03/03/2023    1:20 PM 03/03/2023    8:13 AM 12/19/2022    7:55 AM  Last 3 Weights  Weight (lbs) 174 lb 13.2 oz 175 lb 176 lb 12.8 oz  Weight (kg) 79.3 kg 79.379 kg 80.196 kg      Telemetry    Atrial flutter - Personally Reviewed   Physical Exam   GEN: No acute distress.   Neck: No JVD Cardiac: irregular and tachycardic Respiratory: Clear to auscultation bilaterally. GI: Soft, nontender, non-distended  MS: No edema Neuro:  Nonfocal  Psych: Normal affect   Labs    High Sensitivity Troponin:   Recent Labs  Lab 03/03/23 0843 03/03/23 1048  TROPONINIHS 104* 91*     Chemistry Recent Labs  Lab 03/09/23 0310 03/10/23 0301 03/11/23 0325  NA 134* 130* 132*  K 3.7 3.8 3.8  CL 101 101 100  CO2 23 21* 23  GLUCOSE 107* 102* 115*  BUN 17 17 12   CREATININE 1.14 0.96 1.07  CALCIUM 7.8* 8.0* 8.4*  MG 2.1 2.0 1.9  GFRNONAA >60 >60 >60  ANIONGAP 10 8 9     Hematology Recent Labs  Lab 03/12/23 0251 03/12/23 1642 03/13/23 0541  WBC 9.2 11.8* 8.5  RBC 2.70* 2.96* 2.51*  HGB 8.3* 9.1* 7.7*  HCT 26.4* 28.4* 24.8*  MCV 97.8 95.9 98.8  MCH 30.7 30.7 30.7  MCHC 31.4 32.0 31.0  RDW 14.5 14.5 14.6  PLT 213 243 211    BNP Recent Labs  Lab 03/07/23 1140 03/09/23 0310  BNP 1,402.9* 471.4*      Radiology    CT CARDIAC MORPH/PULM VEIN W/CM&W/O CA SCORE  Addendum Date: 03/12/2023   ADDENDUM REPORT: 03/12/2023 20:14 EXAM: OVER-READ INTERPRETATION  CT CHEST The following report is an over-read performed by radiologist Dr. Audry [facility], [facility] on 03/12/2023. This over-read does not include interpretation of cardiac or coronary anatomy or pathology. The coronary CTA interpretation by the cardiologist is attached. COMPARISON:  None. FINDINGS: Mild global cardiomegaly with left atrial enlargement. Extensive calcification of the aortic and mitral valve leaflets. No pericardial effusion. Visualized thoracic aorta is  unremarkable. Linear metallic density within the distal esophagus just proximal to the gastroesophageal junction may represent an endoscopic clip. Subpleural consolidation within the posterior basal left lower lobe may reflect changes of acute lobar pneumonia in the acute setting, resolving pulmonary infarct or pneumonic consolidation is subacute setting, or post inflammatory or post traumatic fibrotic change if chronic. No focal nodules. Trace left pleural effusion. No acute bone abnormality. No lytic or blastic bone lesion. No chest wall mass. IMPRESSION: 1. Mild global cardiomegaly with left atrial enlargement. 2. Subpleural consolidation within the posterior basal left lower lobe may reflect changes of acute lobar pneumonia in the acute setting, resolving pulmonary infarct or pneumonic consolidation is subacute setting, or post inflammatory or post traumatic fibrotic change if chronic. Follow-up imaging in 3 months would be helpful in documenting resolution or stability. 3. Trace left pleural effusion. Aortic Atherosclerosis (ICD10-I70.0). Electronically Signed   By: Helyn Numbers M.D.   On: 03/12/2023 20:14   Result Date: 03/12/2023 CLINICAL DATA:  48M with atrial fibrillation and GI bleed scheduled for DCCV. EXAM: Cardiac CTA TECHNIQUE: A non-contrast, gated CT scan was obtained with axial slices of 3 mm through the heart for calcium scoring. Calcium scoring was performed using the Agatston method. A 120 kV retrospective, gated, contrast cardiac scan was obtained. Gantry rotation speed was 250 msecs and collimation was 0.6 mm. Nitroglycerin was not given. A delayed scan was obtained to exclude left atrial appendage thrombus. The 3D dataset was reconstructed in 5% intervals of the 0-95% of the R-R cycle. Late systolic phases were analyzed on a dedicated workstation using MPR, MIP, and VRT modes. The patient received 80 cc of contrast. FINDINGS: Image quality: Excellent. Noise artifact is: Limited. Pulmonary  Veins: There is normal pulmonary vein drainage into the left atrium (2 on the right and 2 on the left). RUPV: 26.6 x 22.1 mm RLPV: 27.7 x 25.3 mm LUPV: 19.5 x 17.4 mm LLPV: 23.6 x 21.2 mm Left Atrium: The left atrial size is normal. There is no PFO/ASD. The left atrial appendage is large broccoli type without thrombus on contrast or delayed imaging. The esophagus runs in the left atrial midline and is not in proximity to any of the pulmonary vein ostia. Coronary Arteries: CAC score of 588, which is 66th percentile for age-, race-, and sex-matched controls. Normal coronary origin. Right dominance. The study was performed without use of NTG and is insufficient for plaque evaluation. Right Atrium: Right atrial size is within normal limits. Right Ventricle: The right ventricular cavity is within normal limits. Left Ventricle: The ventricular cavity size is within normal limits. Pericardium: Normal thickness without significant  effusion or calcium present. Pulmonary Artery: Dilated. 3.9 cm. Consistent with pulmonary hypertension. Cardiac valves: The aortic valve is trileaflet without significant calcification. The mitral valve is a normal structure without significant calcification. Aorta: Normal caliber with no significant disease. Mechanical aortic valve. Mitral annular calcification. Extra-cardiac findings: See attached radiology report for non-cardiac structures. IMPRESSION: 1. There is normal pulmonary vein drainage into the left atrium with ostial measurements above. 2. There is no thrombus in the left atrial appendage. 3. The esophagus runs in the left atrial midline and is not in proximity to any of the pulmonary vein ostia. 4. No PFO/ASD. 5. Normal coronary origin. Right dominance. 6. CAC score of 588 which is 66th percentile for age-, race-, and sex-matched controls. 7.  Dilated pulmonary artery consistent with pulmonary hypertension. Chilton Si, MD Electronically Signed: By: Chilton Si M.D. On:  03/12/2023 16:15   CT HEMATURIA WORKUP  Result Date: 03/12/2023 CLINICAL DATA:  Hematuria. EXAM: CT ABDOMEN AND PELVIS WITHOUT AND WITH CONTRAST TECHNIQUE: Multidetector CT imaging of the abdomen and pelvis was performed following the standard protocol before and following the bolus administration of intravenous contrast. RADIATION DOSE REDUCTION: This exam was performed according to the departmental dose-optimization program which includes automated exposure control, adjustment of the mA and/or kV according to patient size and/or use of iterative reconstruction technique. CONTRAST:  OMNIPAQUE IOHEXOL 350 MG/ML SOLN COMPARISON:  Noncontrast abdominopelvic CT 03/03/2023. CT the abdomen and pelvis with contrast 04/14/2020. FINDINGS: Lower chest: Interval development of left lower lobe airspace disease with associated air bronchograms, suspicious for pneumonia. Trace left pleural effusion. Clear right lung base. Aortic and coronary artery atherosclerosis status post median sternotomy and aortic valve replacement. Hepatobiliary: The liver is normal in density without suspicious focal abnormality. Calcified gallstone without residual significant gallbladder distension, wall thickening or surrounding inflammation. No biliary ductal dilatation. Pancreas: Unremarkable. No pancreatic ductal dilatation or surrounding inflammatory changes. Spleen: Normal in size without focal abnormality. Adrenals/Urinary Tract: Both adrenal glands appear normal. Pre-contrast images demonstrate no renal, ureteral or bladder calculi. Post-contrast, both kidneys enhance normally. There is no evidence of enhancing renal mass. Simple renal cysts bilaterally, measuring up to 2.6 cm on the right and up to 2.7 cm on the left. No specific follow-up imaging recommended. Delayed images result in segmental visualization of the ureters. No focal upper tract urothelial abnormalities are identified. Possible mild dependent debris in the bladder  lumen without evidence of focal mucosal lesion or bladder wall thickening. Stomach/Bowel: No enteric contrast administered. New linear intraluminal metallic foreign body at the gastroesophageal junction, measuring approximately 1.7 cm in length, presumably a hemostatic clip placed at endoscopy 03/05/2023. The stomach otherwise appears unremarkable for its degree of distention. No residual small bowel distension, wall thickening or surrounding inflammation. The appendix appears normal. Distal small bowel extends into a right inguinal hernia. No evidence of incarceration. Mildly prominent stool in the colon. Vascular/Lymphatic: There are no enlarged abdominal or pelvic lymph nodes. Aortic and branch vessel atherosclerosis without evidence of aneurysm or large vessel occlusion. Left-sided varicocele noted. Reproductive: The prostate gland and seminal vesicles appear unremarkable. Other: As above, stable right inguinal hernia containing small bowel. Previous right inguinal herniorrhaphy. No ascites or pneumoperitoneum. Musculoskeletal: No acute or significant osseous findings. Stable relatively mild multilevel spondylosis. Unless specific follow-up recommendations are mentioned in the findings or impression sections, no imaging follow-up of any mentioned incidental findings is recommended. IMPRESSION: 1. No acute findings or explanation for the patient's symptoms. No evidence of urinary tract calculus,  renal mass or focal urothelial lesion. 2. Possible mild dependent debris in the bladder lumen. Cystoscopy may be warranted if the patient has persistent unexplained hematuria. 3. Interval development of left lower lobe airspace disease with associated air bronchograms, suspicious for pneumonia. Trace left pleural effusion. Recommend radiographic follow-up. 4. Stable right inguinal hernia containing small bowel without evidence of incarceration or obstruction. Previously demonstrated small bowel distension has resolved. 5.  Cholelithiasis without evidence of acute cholecystitis. 6.  Aortic Atherosclerosis (ICD10-I70.0). Electronically Signed   By: Carey Bullocks M.D.   On: 03/12/2023 16:32     Patient Profile     CHRISTOFHER EASTMAN is a 77 y.o. 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 Cardiology was consulted on 03/04/2023 for further evaluation of atrial flutter with RVR.   Assessment & Plan    1 Atrial flutter-continue tikosyn, metoprolol and coumadin/heparin. CTA showed no LAA thrombus; for DCCV today. Recheck bmet and mg in AM. INR will need to be above 2 prior to DCing heparin.  2 Mechanical AVR-continue heparin and coumadin.   3 hematuria-FU urology as outpt.  4 Recent GI bleed-s/p transfusion, clipping of MW tears; also with gastric ulcers; continue to follow Hgb.  5 abnormal chest CT-needs fu study 3 months.  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  For questions or updates, please contact [facility] Please consult [email or web address] for contact info under        Signed, Olga Millers, MD  03/13/2023, 9:38 AM

## 2023-03-13 NOTE — Progress Notes (Addendum)
8 Days Post-Op Subjective: Patient doing well today no issues with urination. Per discussion with nurse urine has cleared overnight. This is reflected int he string of urine that has been collected.  Objective: Vital signs in last 24 hours: Temp:  [97.6 F (36.4 C)-98.2 F (36.8 C)] 98 F (36.7 C) (11/27 0404) Pulse Rate:  [55-138] 100 (11/27 0400) Resp:  [12-29] 19 (11/27 0400) BP: (101-163)/(49-113) 122/49 (11/27 0400) SpO2:  [91 %-100 %] 100 % (11/27 0400)  Intake/Output from previous day: 11/26 0701 - 11/27 0700 In: 1242.8 [P.O.:240; I.V.:811.1; IV Piggyback:191.7] Out: 2885 [Urine:2885] Intake/Output this shift: Total I/O In: 710.2 [P.O.:240; I.V.:362.3; IV Piggyback:107.9] Out: 850 [Urine:850]  Physical Exam:  General: Alert and oriented CV: slight tachycardia  Lungs: Clear Abdomen: Soft, ND   Lab Results: Recent Labs    03/12/23 0251 03/12/23 1642 03/13/23 0541  HGB 8.3* 9.1* 7.7*  HCT 26.4* 28.4* 24.8*   BMET Recent Labs    03/11/23 0325  NA 132*  K 3.8  CL 100  CO2 23  GLUCOSE 115*  BUN 12  CREATININE 1.07  CALCIUM 8.4*     Studies/Results: CT CARDIAC MORPH/PULM VEIN W/CM&W/O CA SCORE  Addendum Date: 03/12/2023   ADDENDUM REPORT: 03/12/2023 20:14 EXAM: OVER-READ INTERPRETATION  CT CHEST The following report is an over-read performed by radiologist Dr. Audry [facility], [facility] on 03/12/2023. This over-read does not include interpretation of cardiac or coronary anatomy or pathology. The coronary CTA interpretation by the cardiologist is attached. COMPARISON:  None. FINDINGS: Mild global cardiomegaly with left atrial enlargement. Extensive calcification of the aortic and mitral valve leaflets. No pericardial effusion. Visualized thoracic aorta is unremarkable. Linear metallic density within the distal esophagus just proximal to the gastroesophageal junction may represent an endoscopic clip. Subpleural consolidation within the posterior  basal left lower lobe may reflect changes of acute lobar pneumonia in the acute setting, resolving pulmonary infarct or pneumonic consolidation is subacute setting, or post inflammatory or post traumatic fibrotic change if chronic. No focal nodules. Trace left pleural effusion. No acute bone abnormality. No lytic or blastic bone lesion. No chest wall mass. IMPRESSION: 1. Mild global cardiomegaly with left atrial enlargement. 2. Subpleural consolidation within the posterior basal left lower lobe may reflect changes of acute lobar pneumonia in the acute setting, resolving pulmonary infarct or pneumonic consolidation is subacute setting, or post inflammatory or post traumatic fibrotic change if chronic. Follow-up imaging in 3 months would be helpful in documenting resolution or stability. 3. Trace left pleural effusion. Aortic Atherosclerosis (ICD10-I70.0). Electronically Signed   By: Helyn Numbers M.D.   On: 03/12/2023 20:14   Result Date: 03/12/2023 CLINICAL DATA:  54M with atrial fibrillation and GI bleed scheduled for DCCV. EXAM: Cardiac CTA TECHNIQUE: A non-contrast, gated CT scan was obtained with axial slices of 3 mm through the heart for calcium scoring. Calcium scoring was performed using the Agatston method. A 120 kV retrospective, gated, contrast cardiac scan was obtained. Gantry rotation speed was 250 msecs and collimation was 0.6 mm. Nitroglycerin was not given. A delayed scan was obtained to exclude left atrial appendage thrombus. The 3D dataset was reconstructed in 5% intervals of the 0-95% of the R-R cycle. Late systolic phases were analyzed on a dedicated workstation using MPR, MIP, and VRT modes. The patient received 80 cc of contrast. FINDINGS: Image quality: Excellent. Noise artifact is: Limited. Pulmonary Veins: There is normal pulmonary vein drainage into the left atrium (2 on the right and 2 on the left).  RUPV: 26.6 x 22.1 mm RLPV: 27.7 x 25.3 mm LUPV: 19.5 x 17.4 mm LLPV: 23.6 x 21.2 mm Left  Atrium: The left atrial size is normal. There is no PFO/ASD. The left atrial appendage is large broccoli type without thrombus on contrast or delayed imaging. The esophagus runs in the left atrial midline and is not in proximity to any of the pulmonary vein ostia. Coronary Arteries: CAC score of 588, which is 66th percentile for age-, race-, and sex-matched controls. Normal coronary origin. Right dominance. The study was performed without use of NTG and is insufficient for plaque evaluation. Right Atrium: Right atrial size is within normal limits. Right Ventricle: The right ventricular cavity is within normal limits. Left Ventricle: The ventricular cavity size is within normal limits. Pericardium: Normal thickness without significant effusion or calcium present. Pulmonary Artery: Dilated. 3.9 cm. Consistent with pulmonary hypertension. Cardiac valves: The aortic valve is trileaflet without significant calcification. The mitral valve is a normal structure without significant calcification. Aorta: Normal caliber with no significant disease. Mechanical aortic valve. Mitral annular calcification. Extra-cardiac findings: See attached radiology report for non-cardiac structures. IMPRESSION: 1. There is normal pulmonary vein drainage into the left atrium with ostial measurements above. 2. There is no thrombus in the left atrial appendage. 3. The esophagus runs in the left atrial midline and is not in proximity to any of the pulmonary vein ostia. 4. No PFO/ASD. 5. Normal coronary origin. Right dominance. 6. CAC score of 588 which is 66th percentile for age-, race-, and sex-matched controls. 7.  Dilated pulmonary artery consistent with pulmonary hypertension. Chilton Si, MD Electronically Signed: By: Chilton Si M.D. On: 03/12/2023 16:15   CT HEMATURIA WORKUP  Result Date: 03/12/2023 CLINICAL DATA:  Hematuria. EXAM: CT ABDOMEN AND PELVIS WITHOUT AND WITH CONTRAST TECHNIQUE: Multidetector CT imaging of the  abdomen and pelvis was performed following the standard protocol before and following the bolus administration of intravenous contrast. RADIATION DOSE REDUCTION: This exam was performed according to the departmental dose-optimization program which includes automated exposure control, adjustment of the mA and/or kV according to patient size and/or use of iterative reconstruction technique. CONTRAST:  OMNIPAQUE IOHEXOL 350 MG/ML SOLN COMPARISON:  Noncontrast abdominopelvic CT 03/03/2023. CT the abdomen and pelvis with contrast 04/14/2020. FINDINGS: Lower chest: Interval development of left lower lobe airspace disease with associated air bronchograms, suspicious for pneumonia. Trace left pleural effusion. Clear right lung base. Aortic and coronary artery atherosclerosis status post median sternotomy and aortic valve replacement. Hepatobiliary: The liver is normal in density without suspicious focal abnormality. Calcified gallstone without residual significant gallbladder distension, wall thickening or surrounding inflammation. No biliary ductal dilatation. Pancreas: Unremarkable. No pancreatic ductal dilatation or surrounding inflammatory changes. Spleen: Normal in size without focal abnormality. Adrenals/Urinary Tract: Both adrenal glands appear normal. Pre-contrast images demonstrate no renal, ureteral or bladder calculi. Post-contrast, both kidneys enhance normally. There is no evidence of enhancing renal mass. Simple renal cysts bilaterally, measuring up to 2.6 cm on the right and up to 2.7 cm on the left. No specific follow-up imaging recommended. Delayed images result in segmental visualization of the ureters. No focal upper tract urothelial abnormalities are identified. Possible mild dependent debris in the bladder lumen without evidence of focal mucosal lesion or bladder wall thickening. Stomach/Bowel: No enteric contrast administered. New linear intraluminal metallic foreign body at the gastroesophageal  junction, measuring approximately 1.7 cm in length, presumably a hemostatic clip placed at endoscopy 03/05/2023. The stomach otherwise appears unremarkable for its degree of distention.  No residual small bowel distension, wall thickening or surrounding inflammation. The appendix appears normal. Distal small bowel extends into a right inguinal hernia. No evidence of incarceration. Mildly prominent stool in the colon. Vascular/Lymphatic: There are no enlarged abdominal or pelvic lymph nodes. Aortic and branch vessel atherosclerosis without evidence of aneurysm or large vessel occlusion. Left-sided varicocele noted. Reproductive: The prostate gland and seminal vesicles appear unremarkable. Other: As above, stable right inguinal hernia containing small bowel. Previous right inguinal herniorrhaphy. No ascites or pneumoperitoneum. Musculoskeletal: No acute or significant osseous findings. Stable relatively mild multilevel spondylosis. Unless specific follow-up recommendations are mentioned in the findings or impression sections, no imaging follow-up of any mentioned incidental findings is recommended. IMPRESSION: 1. No acute findings or explanation for the patient's symptoms. No evidence of urinary tract calculus, renal mass or focal urothelial lesion. 2. Possible mild dependent debris in the bladder lumen. Cystoscopy may be warranted if the patient has persistent unexplained hematuria. 3. Interval development of left lower lobe airspace disease with associated air bronchograms, suspicious for pneumonia. Trace left pleural effusion. Recommend radiographic follow-up. 4. Stable right inguinal hernia containing small bowel without evidence of incarceration or obstruction. Previously demonstrated small bowel distension has resolved. 5. Cholelithiasis without evidence of acute cholecystitis. 6.  Aortic Atherosclerosis (ICD10-I70.0). Electronically Signed   By: Carey Bullocks M.D.   On: 03/12/2023 16:32     Assessment/Plan: 77 y.o. male with hematuria in the context of heparin infusion and concurrent warfarin currently planned to receive cardioversion.   Reviewed CT Urogram, shows no filling defect in the bladder on CT urogram. No concern for clot in the bladder.      Recommendations: # Hematuria Hematuria resolving likely due to catheter removal trauma GH was only old clot, no concern for active bleed.  Urine today clear no intervention warranted.  Urology will set up for gross hematuria evaluation as outpatient.  No urologic contraindications to cardiology interventions Please call urology if urine color changes.  Urology to sign off.    Transfuse > 8.0.     LOS: 10 days   Adonis Brook 03/13/2023, 6:56 AM

## 2023-03-13 NOTE — Anesthesia Preprocedure Evaluation (Addendum)
Anesthesia Evaluation  Patient identified by MRN, date of birth, ID band Patient awake    Reviewed: Allergy & Precautions, H&P , NPO status , Patient's Chart, lab work & pertinent test results  Airway Mallampati: II  TM Distance: >3 FB Neck ROM: Full    Dental no notable dental hx. (+) Teeth Intact, Dental Advisory Given   Pulmonary Current Smoker   Pulmonary exam normal breath sounds clear to auscultation       Cardiovascular +CHF  + dysrhythmias Atrial Fibrillation  Rhythm:Irregular Rate:Tachycardia     Neuro/Psych Seizures -, Well Controlled,   negative psych ROS   GI/Hepatic negative GI ROS, Neg liver ROS,,,  Endo/Other  negative endocrine ROS    Renal/GU negative Renal ROS  negative genitourinary   Musculoskeletal   Abdominal   Peds  Hematology negative hematology ROS (+)   Anesthesia Other Findings   Reproductive/Obstetrics negative OB ROS                             Anesthesia Physical Anesthesia Plan  ASA: 3  Anesthesia Plan: General   Post-op Pain Management: Minimal or no pain anticipated   Induction: Intravenous  PONV Risk Score and Plan: 1 and Propofol infusion  Airway Management Planned: Natural Airway and Simple Face Mask  Additional Equipment:   Intra-op Plan:   Post-operative Plan:   Informed Consent: I have reviewed the patients History and Physical, chart, labs and discussed the procedure including the risks, benefits and alternatives for the proposed anesthesia with the patient or authorized representative who has indicated his/her understanding and acceptance.     Dental advisory given  Plan Discussed with: CRNA  Anesthesia Plan Comments:        Anesthesia Quick Evaluation

## 2023-03-13 NOTE — Interval H&P Note (Signed)
History and Physical Interval Note:  03/13/2023 10:03 AM  Alan Willis  has presented today for surgery, with the diagnosis of aflutter.  The various methods of treatment have been discussed with the patient and family. After consideration of risks, benefits and other options for treatment, the patient has consented to  Procedure(s): CARDIOVERSION (N/A) as a surgical intervention.  The patient's history has been reviewed, patient examined, no change in status, stable for surgery.  I have reviewed the patient's chart and labs.  Questions were answered to the patient's satisfaction.     [name]

## 2023-03-13 NOTE — Transfer of Care (Signed)
Immediate Anesthesia Transfer of Care Note  Patient: EKAMJOT BONIFACE  Procedure(s) Performed: CARDIOVERSION  Patient Location: Cath Lab  Anesthesia Type:MAC  Level of Consciousness: drowsy  Airway & Oxygen Therapy: Patient Spontanous Breathing and Patient connected to nasal cannula oxygen  Post-op Assessment: Report given to RN and Post -op Vital signs reviewed and stable  Post vital signs: Reviewed and stable  Last Vitals:  Vitals Value Taken Time  BP 128/65 03/13/23 1104  Temp    Pulse 100 03/13/23 1103  Resp 16 03/13/23 1103  SpO2 100 % 03/13/23 1103  Vitals shown include unfiled device data.  Last Pain:  Vitals:   03/13/23 1002  TempSrc: Temporal  PainSc: 0-No pain      Patients Stated Pain Goal: 0 (03/10/23 1600)  Complications: There were no known notable events for this encounter.

## 2023-03-13 NOTE — Plan of Care (Signed)
  Problem: Clinical Measurements: Goal: Ability to maintain clinical measurements within normal limits will improve Outcome: Progressing Goal: Diagnostic test results will improve Outcome: Progressing Goal: Respiratory complications will improve Outcome: Progressing Goal: Cardiovascular complication will be avoided Outcome: Progressing   Problem: Coping: Goal: Level of anxiety will decrease Outcome: Progressing   Problem: Elimination: Goal: Will not experience complications related to bowel motility Outcome: Progressing Goal: Will not experience complications related to urinary retention Outcome: Progressing   Problem: Safety: Goal: Ability to remain free from injury will improve Outcome: Progressing

## 2023-03-13 NOTE — TOC Progression Note (Signed)
Transition of Care [facility]) - Progression Note    Patient Details  Name: Alan Willis MRN: [id number] Date of Birth: [birth year]  Transition [facility]) CM/SW Contact  Lavenia Atlas, RN Phone Number: 03/13/2023, 6:32 PM  Clinical Narrative:   PT recommends HHPT/OT. Patient currently in [facility] SDU will need HH orders and services set up prior to discharge.   TOC following for dc needs    Expected Discharge Plan: Home/Self Care Barriers to Discharge: Continued Medical Work up  Expected Discharge Plan and Services In-house Referral: NA Discharge Planning Services: CM Consult Post Acute Care Choice: NA Living arrangements for the past 2 months: Single Family Home                 DME Arranged: N/A DME Agency: NA       HH Arranged: NA HH Agency: NA         Social Determinants of Health (SDOH) Interventions SDOH Screenings   Food Insecurity: No Food Insecurity (03/03/2023)  Housing: Low Risk  (03/03/2023)  Transportation Needs: No Transportation Needs (03/03/2023)  Utilities: Not At Risk (03/03/2023)  Tobacco Use: High Risk (03/05/2023)    Readmission Risk Interventions    03/13/2023    6:32 PM 03/05/2023   12:52 PM  Readmission Risk Prevention Plan  Transportation Screening Complete Complete  PCP or Specialist Appt within 5-7 Days Complete   PCP or Specialist Appt within 3-5 Days  Complete  Home Care Screening Complete   Medication Review (RN CM) Complete   HRI or Home Care Consult  Complete  Social Work Consult for Recovery Care Planning/Counseling  Complete  Palliative Care Screening  Not Applicable  Medication Review Oceanographer)  Complete

## 2023-03-13 NOTE — Anesthesia Procedure Notes (Signed)
Procedure Name: MAC Date/Time: 03/13/2023 10:59 AM  Performed by: Waynard Edwards, CRNAPre-anesthesia Checklist: Patient identified, Emergency Drugs available, Suction available and Patient being monitored Patient Re-evaluated:Patient Re-evaluated prior to induction Oxygen Delivery Method: Nasal cannula Induction Type: IV induction Placement Confirmation: positive ETCO2 Dental Injury: Teeth and Oropharynx as per pre-operative assessment

## 2023-03-13 NOTE — Progress Notes (Signed)
Heart Failure Navigator Progress Note  Assessed for Heart & Vascular TOC clinic readiness.  Patient with EF 40-45%, Per MD notes patient follows with [facility]. .   Navigator will sign off at this time.   Rhae Hammock, BSN, Scientist, clinical (histocompatibility and immunogenetics) Only

## 2023-03-14 DIAGNOSIS — I4892 Unspecified atrial flutter: Secondary | ICD-10-CM | POA: Diagnosis not present

## 2023-03-14 DIAGNOSIS — K920 Hematemesis: Secondary | ICD-10-CM | POA: Diagnosis not present

## 2023-03-14 DIAGNOSIS — I4891 Unspecified atrial fibrillation: Secondary | ICD-10-CM | POA: Diagnosis not present

## 2023-03-14 DIAGNOSIS — I5033 Acute on chronic diastolic (congestive) heart failure: Secondary | ICD-10-CM | POA: Diagnosis not present

## 2023-03-14 DIAGNOSIS — N179 Acute kidney failure, unspecified: Secondary | ICD-10-CM | POA: Diagnosis not present

## 2023-03-14 LAB — CBC WITH DIFFERENTIAL/PLATELET
Abs Immature Granulocytes: 0.21 10*3/uL — ABNORMAL HIGH (ref 0.00–0.07)
Basophils Absolute: 0 10*3/uL (ref 0.0–0.1)
Basophils Relative: 0 %
Eosinophils Absolute: 0.1 10*3/uL (ref 0.0–0.5)
Eosinophils Relative: 2 %
HCT: 27.1 % — ABNORMAL LOW (ref 39.0–52.0)
Hemoglobin: 9.1 g/dL — ABNORMAL LOW (ref 13.0–17.0)
Immature Granulocytes: 2 %
Lymphocytes Relative: 14 %
Lymphs Abs: 1.3 10*3/uL (ref 0.7–4.0)
MCH: 31.5 pg (ref 26.0–34.0)
MCHC: 33.6 g/dL (ref 30.0–36.0)
MCV: 93.8 fL (ref 80.0–100.0)
Monocytes Absolute: 0.7 10*3/uL (ref 0.1–1.0)
Monocytes Relative: 7 %
Neutro Abs: 7.1 10*3/uL (ref 1.7–7.7)
Neutrophils Relative %: 75 %
Platelets: 258 10*3/uL (ref 150–400)
RBC: 2.89 MIL/uL — ABNORMAL LOW (ref 4.22–5.81)
RDW: 14.8 % (ref 11.5–15.5)
WBC: 9.4 10*3/uL (ref 4.0–10.5)
nRBC: 0 % (ref 0.0–0.2)

## 2023-03-14 LAB — MAGNESIUM: Magnesium: 2.4 mg/dL (ref 1.7–2.4)

## 2023-03-14 LAB — BASIC METABOLIC PANEL
Anion gap: 11 (ref 5–15)
BUN: 18 mg/dL (ref 8–23)
CO2: 24 mmol/L (ref 22–32)
Calcium: 8.7 mg/dL — ABNORMAL LOW (ref 8.9–10.3)
Chloride: 100 mmol/L (ref 98–111)
Creatinine, Ser: 1.29 mg/dL — ABNORMAL HIGH (ref 0.61–1.24)
GFR, Estimated: 57 mL/min — ABNORMAL LOW (ref >60)
Glucose, Bld: 96 mg/dL (ref 70–99)
Potassium: 3.9 mmol/L (ref 3.5–5.1)
Sodium: 135 mmol/L (ref 135–145)

## 2023-03-14 LAB — HEPARIN LEVEL (UNFRACTIONATED)
Heparin Unfractionated: 0.4 [IU]/mL (ref 0.30–0.70)
Heparin Unfractionated: 0.51 [IU]/mL (ref 0.30–0.70)

## 2023-03-14 LAB — PROTIME-INR
INR: 1.7 — ABNORMAL HIGH (ref 0.8–1.2)
Prothrombin Time: 19.8 s — ABNORMAL HIGH (ref 11.4–15.2)

## 2023-03-14 LAB — HEMOGLOBIN AND HEMATOCRIT, BLOOD
HCT: 27.7 % — ABNORMAL LOW (ref 39.0–52.0)
Hemoglobin: 8.8 g/dL — ABNORMAL LOW (ref 13.0–17.0)

## 2023-03-14 MED ORDER — WARFARIN SODIUM 2.5 MG PO TABS
5.0000 mg | ORAL_TABLET | Freq: Once | ORAL | Status: AC
  Administered 2023-03-14: 5 mg via ORAL
  Filled 2023-03-14: qty 2

## 2023-03-14 MED ORDER — POTASSIUM CHLORIDE CRYS ER 20 MEQ PO TBCR
40.0000 meq | EXTENDED_RELEASE_TABLET | Freq: Once | ORAL | Status: AC
  Administered 2023-03-14: 40 meq via ORAL
  Filled 2023-03-14: qty 2

## 2023-03-14 NOTE — Progress Notes (Signed)
Rounding Note    Patient Name: Alan Willis Date of Encounter: 03/14/2023  [facility] Cardiologist: [facility]  Subjective   Pt denies CP or dyspnea  Inpatient Medications    Scheduled Meds:  sodium chloride   Intravenous Once   sodium chloride   Intravenous Once   Chlorhexidine Gluconate Cloth  6 each Topical Daily   dofetilide  250 mcg Oral BID   doxycycline  100 mg Oral Q12H   feeding supplement  1 Container Oral TID BM   furosemide  40 mg Oral BID   lidocaine  1 Application Topical Once   metoprolol tartrate  25 mg Oral BID   pantoprazole (PROTONIX) IV  40 mg Intravenous Q12H   phosphorus  500 mg Oral BID   warfarin  5 mg Oral ONCE-1600   Warfarin - Pharmacist Dosing Inpatient   Does not apply q1600   Continuous Infusions:  cefTRIAXone (ROCEPHIN)  IV Stopped (03/13/23 1734)   heparin 1,400 Units/hr (03/14/23 0707)   levETIRAcetam Stopped (03/13/23 1640)   And   levETIRAcetam Stopped (03/13/23 2341)   PRN Meds: acetaminophen, acetaminophen, alum & mag hydroxide-simeth, bisacodyl, fentaNYL (SUBLIMAZE) injection, haloperidol lactate, magic mouthwash, menthol-cetylpyridinium, methocarbamol (ROBAXIN) injection, naphazoline-glycerin, mouth rinse, phenol, sodium chloride   Vital Signs    Vitals:   03/14/23 0351 03/14/23 0400 03/14/23 0500 03/14/23 0600  BP:  (!) 139/34 (!) 132/32 (!) 136/40  Pulse:  100 99 99  Resp:  17 16 18   Temp: 99.2 F (37.3 C)     TempSrc: Axillary     SpO2:  98% 96% 96%  Weight:      Height:        Intake/Output Summary (Last 24 hours) at 03/14/2023 0732 Last data filed at 03/14/2023 0707 Gross per 24 hour  Intake 1127.42 ml  Output 1200 ml  Net -72.58 ml      03/03/2023    1:20 PM 03/03/2023    8:13 AM 12/19/2022    7:55 AM  Last 3 Weights  Weight (lbs) 174 lb 13.2 oz 175 lb 176 lb 12.8 oz  Weight (kg) 79.3 kg 79.379 kg 80.196 kg      Telemetry    Atrial flutter rate upper normal - Personally  Reviewed   Physical Exam   GEN: NAD Neck: Supple Cardiac: irregular  Respiratory: CTA GI: Soft, NT/ND MS: No edema Neuro:  Grossly intact Psych: Normal affect   Labs    High Sensitivity Troponin:   Recent Labs  Lab 03/03/23 0843 03/03/23 1048  TROPONINIHS 104* 91*     Chemistry Recent Labs  Lab 03/11/23 0325 03/13/23 1638 03/14/23 0311  NA 132* 134* 135  K 3.8 3.7 3.9  CL 100 98 100  CO2 23 24 24   GLUCOSE 115* 134* 96  BUN 12 16 18   CREATININE 1.07 1.29* 1.29*  CALCIUM 8.4* 8.9 8.7*  MG 1.9 2.3 2.4  GFRNONAA >60 57* 57*  ANIONGAP 9 12 11     Hematology Recent Labs  Lab 03/12/23 1642 03/13/23 0541 03/14/23 0015 03/14/23 0311  WBC 11.8* 8.5  --  9.4  RBC 2.96* 2.51*  --  2.89*  HGB 9.1* 7.7* 8.8* 9.1*  HCT 28.4* 24.8* 27.7* 27.1*  MCV 95.9 98.8  --  93.8  MCH 30.7 30.7  --  31.5  MCHC 32.0 31.0  --  33.6  RDW 14.5 14.6  --  14.8  PLT 243 211  --  258    BNP Recent Labs  Lab  03/07/23 1140 03/09/23 0310  BNP 1,402.9* 471.4*      Radiology    EP STUDY  Result Date: 03/13/2023 See surgical note for result.  CT CARDIAC MORPH/PULM VEIN W/CM&W/O CA SCORE  Addendum Date: 03/12/2023   ADDENDUM REPORT: 03/12/2023 20:14 EXAM: OVER-READ INTERPRETATION  CT CHEST The following report is an over-read performed by radiologist Dr. Elon Jester [facility] on 03/12/2023. This over-read does not include interpretation of cardiac or coronary anatomy or pathology. The coronary CTA interpretation by the cardiologist is attached. COMPARISON:  None. FINDINGS: Mild global cardiomegaly with left atrial enlargement. Extensive calcification of the aortic and mitral valve leaflets. No pericardial effusion. Visualized thoracic aorta is unremarkable. Linear metallic density within the distal esophagus just proximal to the gastroesophageal junction may represent an endoscopic clip. Subpleural consolidation within the posterior basal left lower lobe may  reflect changes of acute lobar pneumonia in the acute setting, resolving pulmonary infarct or pneumonic consolidation is subacute setting, or post inflammatory or post traumatic fibrotic change if chronic. No focal nodules. Trace left pleural effusion. No acute bone abnormality. No lytic or blastic bone lesion. No chest wall mass. IMPRESSION: 1. Mild global cardiomegaly with left atrial enlargement. 2. Subpleural consolidation within the posterior basal left lower lobe may reflect changes of acute lobar pneumonia in the acute setting, resolving pulmonary infarct or pneumonic consolidation is subacute setting, or post inflammatory or post traumatic fibrotic change if chronic. Follow-up imaging in 3 months would be helpful in documenting resolution or stability. 3. Trace left pleural effusion. Aortic Atherosclerosis (ICD10-I70.0). Electronically Signed   By: Helyn Numbers M.D.   On: 03/12/2023 20:14   Result Date: 03/12/2023 CLINICAL DATA:  28M with atrial fibrillation and GI bleed scheduled for DCCV. EXAM: Cardiac CTA TECHNIQUE: A non-contrast, gated CT scan was obtained with axial slices of 3 mm through the heart for calcium scoring. Calcium scoring was performed using the Agatston method. A 120 kV retrospective, gated, contrast cardiac scan was obtained. Gantry rotation speed was 250 msecs and collimation was 0.6 mm. Nitroglycerin was not given. A delayed scan was obtained to exclude left atrial appendage thrombus. The 3D dataset was reconstructed in 5% intervals of the 0-95% of the R-R cycle. Late systolic phases were analyzed on a dedicated workstation using MPR, MIP, and VRT modes. The patient received 80 cc of contrast. FINDINGS: Image quality: Excellent. Noise artifact is: Limited. Pulmonary Veins: There is normal pulmonary vein drainage into the left atrium (2 on the right and 2 on the left). RUPV: 26.6 x 22.1 mm RLPV: 27.7 x 25.3 mm LUPV: 19.5 x 17.4 mm LLPV: 23.6 x 21.2 mm Left Atrium: The left atrial  size is normal. There is no PFO/ASD. The left atrial appendage is large broccoli type without thrombus on contrast or delayed imaging. The esophagus runs in the left atrial midline and is not in proximity to any of the pulmonary vein ostia. Coronary Arteries: CAC score of 588, which is 66th percentile for age-, race-, and sex-matched controls. Normal coronary origin. Right dominance. The study was performed without use of NTG and is insufficient for plaque evaluation. Right Atrium: Right atrial size is within normal limits. Right Ventricle: The right ventricular cavity is within normal limits. Left Ventricle: The ventricular cavity size is within normal limits. Pericardium: Normal thickness without significant effusion or calcium present. Pulmonary Artery: Dilated. 3.9 cm. Consistent with pulmonary hypertension. Cardiac valves: The aortic valve is trileaflet without significant calcification. The mitral valve is a normal structure  without significant calcification. Aorta: Normal caliber with no significant disease. Mechanical aortic valve. Mitral annular calcification. Extra-cardiac findings: See attached radiology report for non-cardiac structures. IMPRESSION: 1. There is normal pulmonary vein drainage into the left atrium with ostial measurements above. 2. There is no thrombus in the left atrial appendage. 3. The esophagus runs in the left atrial midline and is not in proximity to any of the pulmonary vein ostia. 4. No PFO/ASD. 5. Normal coronary origin. Right dominance. 6. CAC score of 588 which is 66th percentile for age-, race-, and sex-matched controls. 7.  Dilated pulmonary artery consistent with pulmonary hypertension. Chilton Si, MD Electronically Signed: By: Chilton Si M.D. On: 03/12/2023 16:15   CT HEMATURIA WORKUP  Result Date: 03/12/2023 CLINICAL DATA:  Hematuria. EXAM: CT ABDOMEN AND PELVIS WITHOUT AND WITH CONTRAST TECHNIQUE: Multidetector CT imaging of the abdomen and pelvis was  performed following the standard protocol before and following the bolus administration of intravenous contrast. RADIATION DOSE REDUCTION: This exam was performed according to the departmental dose-optimization program which includes automated exposure control, adjustment of the mA and/or kV according to patient size and/or use of iterative reconstruction technique. CONTRAST:  OMNIPAQUE IOHEXOL 350 MG/ML SOLN COMPARISON:  Noncontrast abdominopelvic CT 03/03/2023. CT the abdomen and pelvis with contrast 04/14/2020. FINDINGS: Lower chest: Interval development of left lower lobe airspace disease with associated air bronchograms, suspicious for pneumonia. Trace left pleural effusion. Clear right lung base. Aortic and coronary artery atherosclerosis status post median sternotomy and aortic valve replacement. Hepatobiliary: The liver is normal in density without suspicious focal abnormality. Calcified gallstone without residual significant gallbladder distension, wall thickening or surrounding inflammation. No biliary ductal dilatation. Pancreas: Unremarkable. No pancreatic ductal dilatation or surrounding inflammatory changes. Spleen: Normal in size without focal abnormality. Adrenals/Urinary Tract: Both adrenal glands appear normal. Pre-contrast images demonstrate no renal, ureteral or bladder calculi. Post-contrast, both kidneys enhance normally. There is no evidence of enhancing renal mass. Simple renal cysts bilaterally, measuring up to 2.6 cm on the right and up to 2.7 cm on the left. No specific follow-up imaging recommended. Delayed images result in segmental visualization of the ureters. No focal upper tract urothelial abnormalities are identified. Possible mild dependent debris in the bladder lumen without evidence of focal mucosal lesion or bladder wall thickening. Stomach/Bowel: No enteric contrast administered. New linear intraluminal metallic foreign body at the gastroesophageal junction, measuring  approximately 1.7 cm in length, presumably a hemostatic clip placed at endoscopy 03/05/2023. The stomach otherwise appears unremarkable for its degree of distention. No residual small bowel distension, wall thickening or surrounding inflammation. The appendix appears normal. Distal small bowel extends into a right inguinal hernia. No evidence of incarceration. Mildly prominent stool in the colon. Vascular/Lymphatic: There are no enlarged abdominal or pelvic lymph nodes. Aortic and branch vessel atherosclerosis without evidence of aneurysm or large vessel occlusion. Left-sided varicocele noted. Reproductive: The prostate gland and seminal vesicles appear unremarkable. Other: As above, stable right inguinal hernia containing small bowel. Previous right inguinal herniorrhaphy. No ascites or pneumoperitoneum. Musculoskeletal: No acute or significant osseous findings. Stable relatively mild multilevel spondylosis. Unless specific follow-up recommendations are mentioned in the findings or impression sections, no imaging follow-up of any mentioned incidental findings is recommended. IMPRESSION: 1. No acute findings or explanation for the patient's symptoms. No evidence of urinary tract calculus, renal mass or focal urothelial lesion. 2. Possible mild dependent debris in the bladder lumen. Cystoscopy may be warranted if the patient has persistent unexplained hematuria. 3. Interval development of  left lower lobe airspace disease with associated air bronchograms, suspicious for pneumonia. Trace left pleural effusion. Recommend radiographic follow-up. 4. Stable right inguinal hernia containing small bowel without evidence of incarceration or obstruction. Previously demonstrated small bowel distension has resolved. 5. Cholelithiasis without evidence of acute cholecystitis. 6.  Aortic Atherosclerosis (ICD10-I70.0). Electronically Signed   By: Carey Bullocks M.D.   On: 03/12/2023 16:32     Patient Profile     Alan Willis is a 77 y.o. 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 Cardiology was consulted on 03/04/2023 for further evaluation of atrial flutter with RVR.  Echocardiogram this admission shows ejection fraction 40 to 45% with global hypokinesis, moderate RV dysfunction, mild right ventricular enlargement, severe biatrial enlargement, moderate to severe mitral stenosis with mean gradient 8.2 mmHg, mild to moderate mitral regurgitation, status post aortic valve replacement with mean gradient 9 mmHg and mildly dilated ascending aorta at 39 mm.  CTA November 2024 showed left lower lobe density and follow-up recommended in 3 months; no left atrial appendage thrombus, calcium score 588 which was 66 percentile.  Patient underwent cardioversion on November 27 and converted to sinus rhythm on 2 separate occasions but atrial flutter recurred.  Assessment & Plan    1 Atrial flutter-patient underwent cardioversion yesterday on 2 separate occasions.  Sinus was reestablished but atrial flutter recurred.  This is despite Tikosyn.  I discussed this with Dr. Elberta Fortis yesterday.  Patient has failed Tikosyn and will therefore discontinue.  In approximately 48 hours we will begin amiodarone load both for rate control and also to help maintain sinus rhythm.  After 30 days of amiodarone can reattempt cardioversion as an outpatient to see if he maintains sinus rhythm.  Otherwise would need rate control and anticoagulation long-term.    2 Mechanical AVR-continue heparin and coumadin; would like INR  greater than 2 prior to discharge..   3 hematuria-FU urology as outpt.  4 Recent GI bleed-s/p transfusion, clipping of MW tears; also with gastric ulcers; continue to follow Hgb.  5 abnormal chest CT-needs fu study 3 months.  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  If heart rate is controlled can likely load amiodarone as an outpatient.  As above would like INR 2 or higher prior to discontinuing heparin.  Patient can be transferred to telemetry from a cardiac standpoint.  For questions or updates, please contact [facility] Please consult [email or web address] for contact info under        Signed, Olga Millers, MD  03/14/2023, 7:32 AM

## 2023-03-14 NOTE — Progress Notes (Signed)
PHARMACY - ANTICOAGULATION CONSULT NOTE  Pharmacy Consult for heparin, warfarin bridge  Indication:  atrial fibrillation, mechanical AVR  Allergies  Allergen Reactions   Amoxicillin-Pot Clavulanate Rash   Codeine Other (See Comments)    Stomach upset    Patient Measurements: Height: 5\' 10"  (177.8 cm) Weight: 79.3 kg (174 lb 13.2 oz) IBW/kg (Calculated) : 73 Heparin Dosing Weight: TBW  Vital Signs: Temp: 99.2 F (37.3 C) (11/28 0351) Temp Source: Axillary (11/28 0351) BP: 118/27 (11/28 0300) Pulse Rate: 98 (11/28 0300)  Labs: Recent Labs    03/12/23 0251 03/12/23 1642 03/13/23 0535 03/13/23 0541 03/13/23 1638 03/14/23 0015 03/14/23 0311  HGB 8.3* 9.1*  --  7.7*  --  8.8* 9.1*  HCT 26.4* 28.4*  --  24.8*  --  27.7* 27.1*  PLT 213 243  --  211  --   --  258  LABPROT 17.1*  --   --  18.2*  --   --  19.8*  INR 1.4*  --   --  1.5*  --   --  1.7*  HEPARINUNFRC 0.63  --  0.71*  --  0.76*  --  0.40  CREATININE  --   --   --   --  1.29*  --  1.29*    Estimated Creatinine Clearance: 50.3 mL/min (A) (by C-G formula based on SCr of 1.29 mg/dL (H)).   Medications: Infusions:   cefTRIAXone (ROCEPHIN)  IV Stopped (03/13/23 1734)   heparin 1,400 Units/hr (03/14/23 0159)   levETIRAcetam Stopped (03/13/23 1640)   And   levETIRAcetam Stopped (03/13/23 2341)     Assessment: 77 YO male presenting with a SBO and hematemesis. Patient has a significant cardiac history of atrial fibrillation (s/p TEE guided DCCV 07/2019) and AVR  [facility] 1983) on chronic warfarin anticoagulation. Last dose of warfarin 11/16 @0800 . INR was 2.9 on admission and increased up to 4.1 on 11/17 -  he was given IV vitamin K 10mg  x1 on 11/18 to get INR <2 for EGD 11/19 which showed 2 large Mallory-Weiss tear with clots--3 endoclips have been placed to close mucosal defect, superficial fundic ulcers. Pharmacy consulted for heparin dosing on 11/19 with plan to restart warfarin in 3 days per GI on 11/22. Will  give first dose of warfarin today.  PTA warfarin regimen: 6mg  daily except 3mg  on Tuesdays.  (goal INR 2.5-3.5 per outpatient notes)  Today, 03/14/23 HL 0.4 therapeutic on 1400 units/hr INR 1.7 Hgb 9.1, plts 258 Per RN no bleeding  Goal of Therapy:  INR 2-3 Heparin level 0.3-0.7 units/ml Monitor platelets by anticoagulation protocol: Yes   Plan:  Continue  heparin drip at 1400 units/hr Repeat heparin level in 8 hours Warfarin 5mg  x 1 tonight Daily heparin level, INR, CBC Monitor for s/sx of bleeding  Continue heparin gtt until INR is within goal range on warfarin.   Arley Phenix RPh 03/14/2023, 4:14 AM

## 2023-03-14 NOTE — Progress Notes (Signed)
Mobility Specialist - Progress Note   03/14/23 1327  Mobility  Activity Ambulated with assistance in hallway  Level of Assistance Standby assist, set-up cues, supervision of patient - no hands on  Assistive Device Front wheel walker  Distance Ambulated (ft) 275 ft  Activity Response Tolerated well  Mobility Referral Yes  $Mobility charge 1 Mobility  Mobility Specialist Start Time (ACUTE ONLY) 1313  Mobility Specialist Stop Time (ACUTE ONLY) 1325  Mobility Specialist Time Calculation (min) (ACUTE ONLY) 12 min   Pt received in bed and agreeable to mobility. No complaints during session. Pt to recliner after session with all needs met.    [facility]

## 2023-03-14 NOTE — Progress Notes (Signed)
PHARMACY - ANTICOAGULATION CONSULT NOTE  Pharmacy Consult for Heparin/Warfarin Indication: mechanical AVR and Afib   Allergies  Allergen Reactions   Amoxicillin-Pot Clavulanate Rash   Codeine Other (See Comments)    Stomach upset    Patient Measurements: Height: 5\' 10"  (177.8 cm) Weight: 79.3 kg (174 lb 13.2 oz) IBW/kg (Calculated) : 73  Vital Signs: Temp: 97.8 F (36.6 C) (11/28 1129) Temp Source: Oral (11/28 1129) BP: 156/44 (11/28 1103) Pulse Rate: 72 (11/28 1103)  Labs: Recent Labs    03/12/23 0251 03/12/23 1642 03/13/23 0535 03/13/23 0541 03/13/23 1638 03/14/23 0015 03/14/23 0311 03/14/23 1103  HGB 8.3* 9.1*  --  7.7*  --  8.8* 9.1*  --   HCT 26.4* 28.4*  --  24.8*  --  27.7* 27.1*  --   PLT 213 243  --  211  --   --  258  --   LABPROT 17.1*  --   --  18.2*  --   --  19.8*  --   INR 1.4*  --   --  1.5*  --   --  1.7*  --   HEPARINUNFRC 0.63  --    < >  --  0.76*  --  0.40 0.51  CREATININE  --   --   --   --  1.29*  --  1.29*  --    < > = values in this interval not displayed.    Estimated Creatinine Clearance: 50.3 mL/min (A) (by C-G formula based on SCr of 1.29 mg/dL (H)).   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  LD on 11/16.  -INR 2.9 on admit.  Goal INR 2.5-3.5 per anticoag office notes Deboraha Sprang at [facility]  02/13/23) -Vit K given 11/18 -11/19 Start heparin gtt -11/22 Resume warfarin - 11/28: Hep level 0.4 and 0.51 both in goal. Hgb 9.1, Plts 258 trending up. INR up to 1.7 today  Goal of Therapy:  Heparin level 0.3-0.7 units/ml Monitor platelets by anticoagulation protocol: Yes   Plan:   Con't IV heparin 1400 units/hr Repeat Coumadin 5 mg po x 1 today Daily HL, CBC, INR   Inger Wiest S. Merilynn Finland, PharmD, BCPS Clinical Staff Pharmacist [email or web address] Merilynn Finland, Nainika Newlun Stillinger 03/14/2023,12:05 PM

## 2023-03-14 NOTE — Progress Notes (Signed)
Report called. Pt & pt's wife notified of transfer at bedside. Belongings collected. Pt stable at time of transfer

## 2023-03-14 NOTE — Progress Notes (Signed)
[facility]                                                                               Alan Willis, is a 77 y.o. male, DOB - Aug 27, 1945, [id number] Admit date - 03/03/2023    Outpatient Primary MD for the patient is [name], MD  LOS - 11  days    Brief summary    77 yo M with mech AV on warfarin, p/w GI bleed, SBO, AKI and Afib RVR.   On admission required pressors due to hemorrhagic shock and Afib.  Transferred to ICU and required vasopressors. EGD on 11/19 showed bleeding likely from [facility] tear. Heparin resumed. AKI resolved. Cards, General surgery and GI on board.  Transferred to [facility] 03/06/2023.    Assessment & Plan    Assessment and Plan:   Hemorrhagic shock secondary to acute blood loss secondary to upper GI bleed/Mallory-Weiss tear S/p 4 units of PRBC transfusion on 03/05/2023 S/p 3 units of fresh frozen plasma on 03/04/2023 S/p EGD showing 2 large nonbleeding Mallory-Weiss tear with stigmata of recent bleeding and 2 large clots at side s/p clips x 3. He was also found to have 2 nonbleeding linear and superficial gastric ulcers with a clean ulcer base in the gastric fundus. Gastroenterology recommends PPI twice daily for 2 months followed by daily PPI. He was cleared by gastroenterology to restart anticoagulation.  Hemoglobin stable around 9.1 today.    Acute anemia of blood loss Monitor H&H.  Hematuria Appears to have resolved. Urology on board, suspect from catheter removal/trauma.   Small bowel obstruction: Surgery managing.  SBO appears to have resolved.  Tube removed 03/05/2023, SBO resolved.  Tolerating regular diet.   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  INR is 1.7 today.     Patient with atrial fibrillation with RVR on admission S/p cardioversion, unfortunately she  remains in afib. Cardiology on board  CTA is negative for left atrial appendage thrombus Continue with metoprolol 25 mg BID,  Tikosyn discontinued by cardiology as he remains in afib with cardioversion and Tikosyn. Plan to start with amiodarone load in 48 hours.  Check BMP and magnesium level today.    Acute hypoxic respiratory failure secondary to acute on chronic systolic  congestive heart failure  Appears to have resolved. Continue with Lasix 40mg  BID. Continue with strict intake and output.  Daily weights.  Pt denies any chest pain or sob or palpitations.     Seizures Continue with Keppra  Delirium  Resolved. He is alert and oriented.    Hypophosphatemia Replaced.    Acute hyponatremia Resolved. Sodium is 135.     Estimated body mass index is 25.08 kg/m as calculated from the following:   Height as of this encounter: 5\' 10"  (1.778 m).   Weight as of this encounter: 79.3 kg.  Code Status: full code.  DVT Prophylaxis:  SCDs Start: 03/03/23 1402 warfarin (COUMADIN) tablet 5 mg   Level of Care: Level of care: Telemetry Family Communication: Updated patient's family at bedside.   Disposition Plan:  Remains inpatient appropriate:  waiting for INR to be >2.  Procedures:  DCCV   Consultants:   Urology Cardiology Gastroenterology.  Antimicrobials:   Anti-infectives (From admission, onward)    Start     Dose/Rate Route Frequency Ordered Stop   03/13/23 1000  cefTRIAXone (ROCEPHIN) 2 g in sodium chloride 0.9 % 100 mL IVPB        2 g 200 mL/hr over 30 Minutes Intravenous Every 24 hours 03/13/23 0854     03/13/23 1000  doxycycline (VIBRA-TABS) tablet 100 mg        100 mg Oral Every 12 hours 03/13/23 0854          Medications  Scheduled Meds:  sodium chloride   Intravenous Once   sodium chloride   Intravenous Once   Chlorhexidine Gluconate Cloth  6 each Topical Daily   doxycycline  100 mg Oral Q12H   feeding supplement  1 Container Oral TID BM    furosemide  40 mg Oral BID   lidocaine  1 Application Topical Once   metoprolol tartrate  25 mg Oral BID   pantoprazole (PROTONIX) IV  40 mg Intravenous Q12H   phosphorus  500 mg Oral BID   warfarin  5 mg Oral ONCE-1600   Warfarin - Pharmacist Dosing Inpatient   Does not apply q1600   Continuous Infusions:  cefTRIAXone (ROCEPHIN)  IV Stopped (03/13/23 1734)   heparin 1,400 Units/hr (03/14/23 0935)   levETIRAcetam Stopped (03/13/23 1640)   And   levETIRAcetam Stopped (03/13/23 2341)   PRN Meds:.acetaminophen, acetaminophen, alum & mag hydroxide-simeth, bisacodyl, fentaNYL (SUBLIMAZE) injection, haloperidol lactate, magic mouthwash, menthol-cetylpyridinium, methocarbamol (ROBAXIN) injection, naphazoline-glycerin, mouth rinse, phenol, sodium chloride    Subjective:   Alan Willis was seen and examined today. Some blood in the urine overnight, which appears to have cleared.   Objective:   Vitals:   03/14/23 0700 03/14/23 0730 03/14/23 0800 03/14/23 0900  BP: (!) 133/48  (!) 128/48 (!) 149/34  Pulse: 96  100   Resp: (!) 21  20 14   Temp:  98.2 F (36.8 C)    TempSrc:  Oral    SpO2: 100%  99%   Weight:      Height:        Intake/Output Summary (Last 24 hours) at 03/14/2023 1013 Last data filed at 03/14/2023 0935 Gross per 24 hour  Intake 1186.5 ml  Output 1200 ml  Net -13.5 ml   Filed Weights   03/03/23 0813 03/03/23 1320  Weight: 79.4 kg 79.3 kg     Exam General exam: Appears calm and comfortable  Respiratory system: Clear to auscultation. Respiratory effort normal. Cardiovascular system: S1 & S2 heard, RRR.  Gastrointestinal system: Abdomen is nondistended, soft and nontender.  Central nervous system: Alert and oriented. No focal neurological deficits. Extremities: Symmetric 5 x 5 power. Skin: No rashes,  Psychiatry: Mood & affect appropriate.     Data Reviewed:  I have personally reviewed following labs and imaging studies   CBC Lab Results   Component Value Date   WBC 9.4 03/14/2023   RBC 2.89 (L) 03/14/2023   HGB 9.1 (L) 03/14/2023   HCT 27.1 (L) 03/14/2023   MCV 93.8 03/14/2023   MCH 31.5 03/14/2023   PLT 258 03/14/2023   MCHC 33.6 03/14/2023   RDW 14.8 03/14/2023   LYMPHSABS 1.3 03/14/2023   MONOABS 0.7 03/14/2023   EOSABS 0.1 03/14/2023   BASOSABS 0.0 03/14/2023     Last metabolic panel Lab Results  Component  Value Date   NA 135 03/14/2023   K 3.9 03/14/2023   CL 100 03/14/2023   CO2 24 03/14/2023   BUN 18 03/14/2023   CREATININE 1.29 (H) 03/14/2023   GLUCOSE 96 03/14/2023   GFRNONAA 57 (L) 03/14/2023   GFRAA  12/29/2009    >60        The eGFR has been calculated using the MDRD equation. This calculation has not been validated in all clinical situations. eGFR's persistently <60 mL/min signify possible Chronic Kidney Disease.   CALCIUM 8.7 (L) 03/14/2023   PHOS 2.6 03/09/2023   PROT 5.6 (L) 03/04/2023   ALBUMIN 3.6 03/04/2023   BILITOT 2.3 (H) 03/04/2023   ALKPHOS 30 (L) 03/04/2023   AST 26 03/04/2023   ALT 13 03/04/2023   ANIONGAP 11 03/14/2023    CBG (last 3)  No results for input(s): "GLUCAP" in the last 72 hours.    Coagulation Profile: Recent Labs  Lab 03/10/23 0301 03/11/23 0325 03/12/23 0251 03/13/23 0541 03/14/23 0311  INR 1.1 1.2 1.4* 1.5* 1.7*     Radiology Studies: EP STUDY  Result Date: 03/13/2023 See surgical note for result.  CT CARDIAC MORPH/PULM VEIN W/CM&W/O CA SCORE  Addendum Date: 03/12/2023   ADDENDUM REPORT: 03/12/2023 20:14 EXAM: OVER-READ INTERPRETATION  CT CHEST The following report is an over-read performed by radiologist Dr. Elon Jester [facility] on 03/12/2023. This over-read does not include interpretation of cardiac or coronary anatomy or pathology. The coronary CTA interpretation by the cardiologist is attached. COMPARISON:  None. FINDINGS: Mild global cardiomegaly with left atrial enlargement. Extensive calcification of the  aortic and mitral valve leaflets. No pericardial effusion. Visualized thoracic aorta is unremarkable. Linear metallic density within the distal esophagus just proximal to the gastroesophageal junction may represent an endoscopic clip. Subpleural consolidation within the posterior basal left lower lobe may reflect changes of acute lobar pneumonia in the acute setting, resolving pulmonary infarct or pneumonic consolidation is subacute setting, or post inflammatory or post traumatic fibrotic change if chronic. No focal nodules. Trace left pleural effusion. No acute bone abnormality. No lytic or blastic bone lesion. No chest wall mass. IMPRESSION: 1. Mild global cardiomegaly with left atrial enlargement. 2. Subpleural consolidation within the posterior basal left lower lobe may reflect changes of acute lobar pneumonia in the acute setting, resolving pulmonary infarct or pneumonic consolidation is subacute setting, or post inflammatory or post traumatic fibrotic change if chronic. Follow-up imaging in 3 months would be helpful in documenting resolution or stability. 3. Trace left pleural effusion. Aortic Atherosclerosis (ICD10-I70.0). Electronically Signed   By: Helyn Numbers M.D.   On: 03/12/2023 20:14   Result Date: 03/12/2023 CLINICAL DATA:  46M with atrial fibrillation and GI bleed scheduled for DCCV. EXAM: Cardiac CTA TECHNIQUE: A non-contrast, gated CT scan was obtained with axial slices of 3 mm through the heart for calcium scoring. Calcium scoring was performed using the Agatston method. A 120 kV retrospective, gated, contrast cardiac scan was obtained. Gantry rotation speed was 250 msecs and collimation was 0.6 mm. Nitroglycerin was not given. A delayed scan was obtained to exclude left atrial appendage thrombus. The 3D dataset was reconstructed in 5% intervals of the 0-95% of the R-R cycle. Late systolic phases were analyzed on a dedicated workstation using MPR, MIP, and VRT modes. The patient received 80  cc of contrast. FINDINGS: Image quality: Excellent. Noise artifact is: Limited. Pulmonary Veins: There is normal pulmonary vein drainage into the left atrium (2 on the right and 2  on the left). RUPV: 26.6 x 22.1 mm RLPV: 27.7 x 25.3 mm LUPV: 19.5 x 17.4 mm LLPV: 23.6 x 21.2 mm Left Atrium: The left atrial size is normal. There is no PFO/ASD. The left atrial appendage is large broccoli type without thrombus on contrast or delayed imaging. The esophagus runs in the left atrial midline and is not in proximity to any of the pulmonary vein ostia. Coronary Arteries: CAC score of 588, which is 66th percentile for age-, race-, and sex-matched controls. Normal coronary origin. Right dominance. The study was performed without use of NTG and is insufficient for plaque evaluation. Right Atrium: Right atrial size is within normal limits. Right Ventricle: The right ventricular cavity is within normal limits. Left Ventricle: The ventricular cavity size is within normal limits. Pericardium: Normal thickness without significant effusion or calcium present. Pulmonary Artery: Dilated. 3.9 cm. Consistent with pulmonary hypertension. Cardiac valves: The aortic valve is trileaflet without significant calcification. The mitral valve is a normal structure without significant calcification. Aorta: Normal caliber with no significant disease. Mechanical aortic valve. Mitral annular calcification. Extra-cardiac findings: See attached radiology report for non-cardiac structures. IMPRESSION: 1. There is normal pulmonary vein drainage into the left atrium with ostial measurements above. 2. There is no thrombus in the left atrial appendage. 3. The esophagus runs in the left atrial midline and is not in proximity to any of the pulmonary vein ostia. 4. No PFO/ASD. 5. Normal coronary origin. Right dominance. 6. CAC score of 588 which is 66th percentile for age-, race-, and sex-matched controls. 7.  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 Chilton Si, MD Electronically Signed: By: Chilton Si M.D. On: 03/12/2023 16:15   CT HEMATURIA WORKUP  Result Date: 03/12/2023 CLINICAL DATA:  Hematuria. EXAM: CT ABDOMEN AND PELVIS WITHOUT AND WITH CONTRAST TECHNIQUE: Multidetector CT imaging of the abdomen and pelvis was performed following the standard protocol before and following the bolus administration of intravenous contrast. RADIATION DOSE REDUCTION: This exam was performed according to the departmental dose-optimization program which includes automated exposure control, adjustment of the mA and/or kV according to patient size and/or use of iterative reconstruction technique. CONTRAST:  OMNIPAQUE IOHEXOL 350 MG/ML SOLN COMPARISON:  Noncontrast abdominopelvic CT 03/03/2023. CT the abdomen and pelvis with contrast 04/14/2020. FINDINGS: Lower chest: Interval development of left lower lobe airspace disease with associated air bronchograms, suspicious for pneumonia. Trace left pleural effusion. Clear right lung base. Aortic and coronary artery atherosclerosis status post median sternotomy and aortic valve replacement. Hepatobiliary: The liver is normal in density without suspicious focal abnormality. Calcified gallstone without residual significant gallbladder distension, wall thickening or surrounding inflammation. No biliary ductal dilatation. Pancreas: Unremarkable. No pancreatic ductal dilatation or surrounding inflammatory changes. Spleen: Normal in size without focal abnormality. Adrenals/Urinary Tract: Both adrenal glands appear normal. Pre-contrast images demonstrate no renal, ureteral or bladder calculi. Post-contrast, both kidneys enhance normally. There is no evidence of enhancing renal mass. Simple renal cysts bilaterally, measuring up to 2.6 cm on the right and up to 2.7 cm on the left. No specific follow-up imaging recommended. Delayed images result in segmental visualization of the ureters. No focal upper tract  urothelial abnormalities are identified. Possible mild dependent debris in the bladder lumen without evidence of focal mucosal lesion or bladder wall thickening. Stomach/Bowel: No enteric contrast administered. New linear intraluminal metallic foreign body at the gastroesophageal junction, measuring approximately 1.7 cm in length, presumably a hemostatic clip placed at endoscopy 03/05/2023. The stomach otherwise appears unremarkable for its degree  of distention. No residual small bowel distension, wall thickening or surrounding inflammation. The appendix appears normal. Distal small bowel extends into a right inguinal hernia. No evidence of incarceration. Mildly prominent stool in the colon. Vascular/Lymphatic: There are no enlarged abdominal or pelvic lymph nodes. Aortic and branch vessel atherosclerosis without evidence of aneurysm or large vessel occlusion. Left-sided varicocele noted. Reproductive: The prostate gland and seminal vesicles appear unremarkable. Other: As above, stable right inguinal hernia containing small bowel. Previous right inguinal herniorrhaphy. No ascites or pneumoperitoneum. Musculoskeletal: No acute or significant osseous findings. Stable relatively mild multilevel spondylosis. Unless specific follow-up recommendations are mentioned in the findings or impression sections, no imaging follow-up of any mentioned incidental findings is recommended. IMPRESSION: 1. No acute findings or explanation for the patient's symptoms. No evidence of urinary tract calculus, renal mass or focal urothelial lesion. 2. Possible mild dependent debris in the bladder lumen. Cystoscopy may be warranted if the patient has persistent unexplained hematuria. 3. Interval development of left lower lobe airspace disease with associated air bronchograms, suspicious for pneumonia. Trace left pleural effusion. Recommend radiographic follow-up. 4. Stable right inguinal hernia containing small bowel without evidence of  incarceration or obstruction. Previously demonstrated small bowel distension has resolved. 5. Cholelithiasis without evidence of acute cholecystitis. 6.  Aortic Atherosclerosis (ICD10-I70.0). Electronically Signed   By: Carey Bullocks M.D.   On: 03/12/2023 16:32       Kathlen Mody M.D. [facility] 03/14/2023, 10:13 AM  Available via Epic secure chat 7am-7pm After 7 pm, please refer to night coverage provider listed on amion.

## 2023-03-14 NOTE — Plan of Care (Signed)
Problem: Education: Goal: Knowledge of General Education information will improve Description: Including pain rating scale, medication(s)/side effects and non-pharmacologic comfort measures Outcome: Progressing   Problem: Clinical Measurements: Goal: Ability to maintain clinical measurements within normal limits will improve Outcome: Progressing Goal: Cardiovascular complication will be avoided Outcome: Progressing   Problem: Activity: Goal: Risk for activity intolerance will decrease Outcome: Progressing   Problem: Nutrition: Goal: Adequate nutrition will be maintained Outcome: Progressing

## 2023-03-15 DIAGNOSIS — I4891 Unspecified atrial fibrillation: Secondary | ICD-10-CM | POA: Diagnosis not present

## 2023-03-15 DIAGNOSIS — N179 Acute kidney failure, unspecified: Secondary | ICD-10-CM | POA: Diagnosis not present

## 2023-03-15 DIAGNOSIS — K920 Hematemesis: Secondary | ICD-10-CM | POA: Diagnosis not present

## 2023-03-15 DIAGNOSIS — I4892 Unspecified atrial flutter: Secondary | ICD-10-CM | POA: Diagnosis not present

## 2023-03-15 DIAGNOSIS — I5033 Acute on chronic diastolic (congestive) heart failure: Secondary | ICD-10-CM | POA: Diagnosis not present

## 2023-03-15 LAB — TYPE AND SCREEN
ABO/RH(D): O POS
Antibody Screen: NEGATIVE
Unit division: 0

## 2023-03-15 LAB — CBC
HCT: 28.8 % — ABNORMAL LOW (ref 39.0–52.0)
Hemoglobin: 9.1 g/dL — ABNORMAL LOW (ref 13.0–17.0)
MCH: 30.7 pg (ref 26.0–34.0)
MCHC: 31.6 g/dL (ref 30.0–36.0)
MCV: 97.3 fL (ref 80.0–100.0)
Platelets: 221 10*3/uL (ref 150–400)
RBC: 2.96 MIL/uL — ABNORMAL LOW (ref 4.22–5.81)
RDW: 14.8 % (ref 11.5–15.5)
WBC: 8.2 10*3/uL (ref 4.0–10.5)
nRBC: 0 % (ref 0.0–0.2)

## 2023-03-15 LAB — PHOSPHORUS: Phosphorus: 3.5 mg/dL (ref 2.5–4.6)

## 2023-03-15 LAB — BPAM RBC
Blood Product Expiration Date: 202412232359
ISSUE DATE / TIME: 202411271807
Unit Type and Rh: 5100

## 2023-03-15 LAB — HEPARIN LEVEL (UNFRACTIONATED): Heparin Unfractionated: 0.42 [IU]/mL (ref 0.30–0.70)

## 2023-03-15 LAB — PROTIME-INR
INR: 1.6 — ABNORMAL HIGH (ref 0.8–1.2)
Prothrombin Time: 19.6 s — ABNORMAL HIGH (ref 11.4–15.2)

## 2023-03-15 MED ORDER — WARFARIN SODIUM 6 MG PO TABS
6.0000 mg | ORAL_TABLET | Freq: Once | ORAL | Status: AC
  Administered 2023-03-15: 6 mg via ORAL
  Filled 2023-03-15: qty 1

## 2023-03-15 MED ORDER — LEVETIRACETAM 500 MG PO TABS
500.0000 mg | ORAL_TABLET | Freq: Every day | ORAL | Status: DC
  Administered 2023-03-15 – 2023-03-17 (×3): 500 mg via ORAL
  Filled 2023-03-15 (×3): qty 1

## 2023-03-15 MED ORDER — LEVETIRACETAM 500 MG PO TABS
750.0000 mg | ORAL_TABLET | Freq: Every day | ORAL | Status: DC
  Administered 2023-03-15 – 2023-03-16 (×2): 750 mg via ORAL
  Filled 2023-03-15 (×2): qty 1

## 2023-03-15 NOTE — Plan of Care (Signed)

## 2023-03-15 NOTE — Progress Notes (Signed)
[facility]                                                                               Imhotep Score, is a 77 y.o. male, DOB - [birth year], NGE:[id number] Admit date - 03/03/2023    Outpatient Primary MD for the patient is [name], MD  LOS - 12  days    Brief summary    77 yo M with mech AV on warfarin, p/w GI bleed, SBO, AKI and Afib RVR.   On admission required pressors due to hemorrhagic shock and Afib.  Transferred to ICU and required vasopressors. EGD on 11/19 showed bleeding likely from [facility] tear. Heparin resumed. AKI resolved. Cards, General surgery and GI on board.  Transferred to [facility] 03/06/2023.    Assessment & Plan    Assessment and Plan:   Hemorrhagic shock secondary to acute blood loss secondary to upper GI bleed/Mallory-Weiss tear S/p 4 units of PRBC transfusion on 03/05/2023 S/p 3 units of fresh frozen plasma on 03/04/2023 S/p EGD showing 2 large nonbleeding Mallory-Weiss tear with stigmata of recent bleeding and 2 large clots at side s/p clips x 3. He was also found to have 2 nonbleeding linear and superficial gastric ulcers with a clean ulcer base in the gastric fundus. Gastroenterology recommends PPI twice daily for 2 months followed by daily PPI. He was cleared by gastroenterology to restart anticoagulation.  Hemoglobin stable around 9.1 today. BP parameters are optimal.    Acute anemia of blood loss Hemoglobin stable around 9.1  Hematuria Appears to have resolved. Urology on board, suspect from catheter removal/trauma.   Small bowel obstruction: Surgery managing.  SBO appears to have resolved.  Tube removed 03/05/2023, SBO resolved.  Tolerating regular diet.   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  INR is 1.6 today.     Patient with atrial fibrillation with RVR on  admission S/p cardioversion, unfortunately he remains in afib. Cardiology on board  CTA is negative for left atrial appendage thrombus Continue with metoprolol 25 mg BID,  Tikosyn discontinued by cardiology as he remains in afib with cardioversion Plan to start  amiodarone load tomorrow.     Acute hypoxic respiratory failure secondary to acute on chronic systolic  congestive heart failure  Appears to have resolved. Continue with Lasix 40mg  BID. Creatinine stable around 1.2.  Continue with strict intake and output.  Daily weights.  Pt denies any chest pain or sob or palpitations.     Seizures Continue with Keppra  Delirium  Resolved. He is alert and oriented.    Hypophosphatemia Replaced.    Acute hyponatremia Resolved. Sodium is 135.     Estimated body mass index is 25.08 kg/m as calculated from the following:   Height as of this encounter: 5\' 10"  (1.778 m).   Weight as of this encounter: 79.3 kg.  Code Status: full code.  DVT Prophylaxis:  SCDs Start: 03/03/23 1402 warfarin (COUMADIN) tablet 6 mg   Level of Care: Level of care: Telemetry Family Communication: Updated patient's family at bedside.   Disposition Plan:  Remains inpatient appropriate:  waiting for INR to be >2.  Procedures:  DCCV   Consultants:   Urology Cardiology Gastroenterology.  Antimicrobials:   Anti-infectives (From admission, onward)    Start     Dose/Rate Route Frequency Ordered Stop   03/13/23 1000  cefTRIAXone (ROCEPHIN) 2 g in sodium chloride 0.9 % 100 mL IVPB        2 g 200 mL/hr over 30 Minutes Intravenous Every 24 hours 03/13/23 0854     03/13/23 1000  doxycycline (VIBRA-TABS) tablet 100 mg        100 mg Oral Every 12 hours 03/13/23 0854          Medications  Scheduled Meds:  sodium chloride   Intravenous Once   sodium chloride   Intravenous Once   Chlorhexidine Gluconate Cloth  6 each Topical Daily   doxycycline  100 mg Oral Q12H   feeding supplement  1  Container Oral TID BM   furosemide  40 mg Oral BID   levETIRAcetam  500 mg Oral Daily   And   levETIRAcetam  750 mg Oral QHS   lidocaine  1 Application Topical Once   metoprolol tartrate  25 mg Oral BID   pantoprazole (PROTONIX) IV  40 mg Intravenous Q12H   warfarin  6 mg Oral ONCE-1600   Warfarin - Pharmacist Dosing Inpatient   Does not apply q1600   Continuous Infusions:  cefTRIAXone (ROCEPHIN)  IV 2 g (03/15/23 1006)   heparin 1,400 Units/hr (03/15/23 0447)   PRN Meds:.acetaminophen, acetaminophen, alum & mag hydroxide-simeth, bisacodyl, fentaNYL (SUBLIMAZE) injection, haloperidol lactate, magic mouthwash, menthol-cetylpyridinium, methocarbamol (ROBAXIN) injection, naphazoline-glycerin, mouth rinse, phenol, sodium chloride    Subjective:   Alan Willis was seen and examined today. No new complaints.   Objective:   Vitals:   03/14/23 1701 03/14/23 2139 03/15/23 0452 03/15/23 0453  BP: 127/82 (!) 89/44 95/73 95/73   Pulse: (!) 101 98 99 94  Resp: 14 16 18 18   Temp: 97.6 F (36.4 C) 98.1 F (36.7 C) 98.1 F (36.7 C) 98.1 F (36.7 C)  TempSrc: Oral Oral Oral Oral  SpO2: 100% 99% 98% 98%  Weight:      Height:        Intake/Output Summary (Last 24 hours) at 03/15/2023 1228 Last data filed at 03/15/2023 1143 Gross per 24 hour  Intake 1660.62 ml  Output 525 ml  Net 1135.62 ml   Filed Weights   03/03/23 0813 03/03/23 1320  Weight: 79.4 kg 79.3 kg     Exam General exam: Appears calm and comfortable  Respiratory system: Clear to auscultation. Respiratory effort normal. Cardiovascular system: S1 & S2 heard, irregularly irregular, no JVD.  Gastrointestinal system: Abdomen is nondistended, soft and nontender.  Central nervous system: Alert and oriented. No focal neurological deficits. Extremities: Symmetric 5 x 5 power. Skin: No rashes,  Psychiatry:  Mood & affect appropriate.      Data Reviewed:  I have personally reviewed following labs and imaging  studies   CBC Lab Results  Component Value Date   WBC 8.2 03/15/2023   RBC 2.96 (L) 03/15/2023   HGB 9.1 (L) 03/15/2023   HCT 28.8 (L) 03/15/2023   MCV 97.3 03/15/2023   MCH 30.7 03/15/2023   PLT 221 03/15/2023   MCHC 31.6 03/15/2023   RDW 14.8 03/15/2023   LYMPHSABS 1.3 03/14/2023   MONOABS 0.7 03/14/2023   EOSABS 0.1 03/14/2023   BASOSABS 0.0 03/14/2023     Last metabolic panel Lab Results  Component Value Date   NA 135 03/14/2023   K 3.9 03/14/2023   CL 100 03/14/2023   CO2 24 03/14/2023   BUN 18 03/14/2023   CREATININE 1.29 (H) 03/14/2023   GLUCOSE 96 03/14/2023   GFRNONAA 57 (L) 03/14/2023   GFRAA  12/29/2009    >60        The eGFR has been calculated using the MDRD equation. This calculation has not been validated in all clinical situations. eGFR's persistently <60 mL/min signify possible Chronic Kidney Disease.   CALCIUM 8.7 (L) 03/14/2023   PHOS 3.5 03/15/2023   PROT 5.6 (L) 03/04/2023   ALBUMIN 3.6 03/04/2023   BILITOT 2.3 (H) 03/04/2023   ALKPHOS 30 (L) 03/04/2023   AST 26 03/04/2023   ALT 13 03/04/2023   ANIONGAP 11 03/14/2023    CBG (last 3)  No results for input(s): "GLUCAP" in the last 72 hours.    Coagulation Profile: Recent Labs  Lab 03/11/23 0325 03/12/23 0251 03/13/23 0541 03/14/23 0311 03/15/23 0435  INR 1.2 1.4* 1.5* 1.7* 1.6*     Radiology Studies: No results found.     Kathlen Mody M.D. [facility] 03/15/2023, 12:28 PM  Available via Epic secure chat 7am-7pm After 7 pm, please refer to night coverage provider listed on amion.

## 2023-03-15 NOTE — Progress Notes (Signed)
Centralized telemetry monitoring called to report that patient is in A flutter rhythm. MD on call made aware.

## 2023-03-15 NOTE — Progress Notes (Signed)
Physical Therapy Treatment Patient Details Name: Alan Willis MRN: [id number] DOB: 05/16/1945 Today's Date: 03/15/2023   History of Present Illness 77 year old male with a past medical history atrial fibrillation, history of ablation, mitral regurgitation, mitral valve stenosis, aortic valve replacement on warfarin who presented to the emergency department 03/03/23  with complaints of melena, hematemesis, abdominal distention, abdominal pain with workup showing SBO, AKI and positive occult blood in stool,Hemorrhagic shock secondary to acute blood loss anemia secondary to upper GI bleed, new small left pleural effusion, requiring increased Oxygen.    PT Comments  Pt OXA x 3 pleasant and motivated.  Already amb in hallway earlier with Spouse and is currently in recliner.  Assisted with amb in hallway went well >200 feet.  General Gait Details: good alternating gait using walker lightly with no overt LOB.  Avg HR with activity was 110.  No dyspnea. Pt plans to return home when medically stable.  Educated Spouse LPT has re HH PT.  She agrees.  Also rec a RW for community use.  Secure chat sent to [facility] for HHPT and RW.    If plan is discharge home, recommend the following: A little help with walking and/or transfers;A little help with bathing/dressing/bathroom;Assistance with cooking/housework;Assist for transportation;Help with stairs or ramp for entrance   Can travel by private vehicle        Equipment Recommendations  Rolling walker (2 wheels)    Recommendations for Other Services       Precautions / Restrictions Precautions Precautions: Fall Precaution Comments: monitor sats, HR, BP Restrictions Weight Bearing Restrictions: No     Mobility  Bed Mobility               General bed mobility comments: OOB in recliner    Transfers Overall transfer level: Needs assistance Equipment used: Rolling walker (2 wheels) Transfers: Sit to/from Stand Sit to Stand: Supervision            General transfer comment: <25% VC's on proper hand placement to avoid pulling up on walker    Ambulation/Gait Ambulation/Gait assistance: Supervision, Contact guard assist Gait Distance (Feet): 220 Feet Assistive device: Rolling walker (2 wheels) Gait Pattern/deviations: Narrow base of support, Step-through pattern, Decreased stride length Gait velocity: decr     General Gait Details: good alternating gait using walker lightly with no overt LOB.  Avg HR with activity was 110.  No dyspnea.   Stairs             Wheelchair Mobility     Tilt Bed    Modified Rankin (Stroke Patients Only)       Balance                                            Cognition Arousal: Alert Behavior During Therapy: WFL for tasks assessed/performed Overall Cognitive Status: Within Functional Limits for tasks assessed                                 General Comments: AxO x 3 pleasant/motivated        Exercises      General Comments        Pertinent Vitals/Pain Pain Assessment Pain Assessment: No/denies pain    Home Living  Prior Function            PT Goals (current goals can now be found in the care plan section) Progress towards PT goals: Progressing toward goals    Frequency    Min 1X/week      PT Plan      Co-evaluation              AM-PAC PT "6 Clicks" Mobility   Outcome Measure  Help needed turning from your back to your side while in a flat bed without using bedrails?: None Help needed moving from lying on your back to sitting on the side of a flat bed without using bedrails?: None Help needed moving to and from a bed to a chair (including a wheelchair)?: None Help needed standing up from a chair using your arms (e.g., wheelchair or bedside chair)?: None Help needed to walk in hospital room?: A Little Help needed climbing 3-5 steps with a railing? : A Little 6 Click  Score: 22    End of Session Equipment Utilized During Treatment: Gait belt Activity Tolerance: Patient tolerated treatment well Patient left: in chair;with call bell/phone within reach;with chair alarm set Nurse Communication: Mobility status PT Visit Diagnosis: Unsteadiness on feet (R26.81);Difficulty in walking, not elsewhere classified (R26.2)     Time: 1002-1020 PT Time Calculation (min) (ACUTE ONLY): 18 min  Charges:    $Gait Training: 8-22 mins PT General Charges $$ ACUTE PT VISIT: 1 Visit                     Felecia Shelling  PTA Acute  Rehabilitation Services Office M-F          [phone]

## 2023-03-15 NOTE — Progress Notes (Signed)
PHARMACY - ANTICOAGULATION CONSULT NOTE  Pharmacy Consult for Heparin/Warfarin Indication: mechanical AVR and Afib   Allergies  Allergen Reactions   Amoxicillin-Pot Clavulanate Rash   Codeine Other (See Comments)    Stomach upset    Patient Measurements: Height: 5\' 10"  (177.8 cm) Weight: 79.3 kg (174 lb 13.2 oz) IBW/kg (Calculated) : 73 Heparin dosing weight: total body weight   Vital Signs: Temp: 98.1 F (36.7 C) (11/29 0453) Temp Source: Oral (11/29 0453) BP: 95/73 (11/29 0453) Pulse Rate: 94 (11/29 0453)  Labs: Recent Labs    03/13/23 0541 03/13/23 1638 03/14/23 0015 03/14/23 0311 03/14/23 1103 03/15/23 0435  HGB 7.7*  --  8.8* 9.1*  --  9.1*  HCT 24.8*  --  27.7* 27.1*  --  28.8*  PLT 211  --   --  258  --  221  LABPROT 18.2*  --   --  19.8*  --  19.6*  INR 1.5*  --   --  1.7*  --  1.6*  HEPARINUNFRC  --  0.76*  --  0.40 0.51 0.42  CREATININE  --  1.29*  --  1.29*  --   --     Estimated Creatinine Clearance: 50.3 mL/min (A) (by C-G formula based on SCr of 1.29 mg/dL (H)).   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  LD on 11/16.  -INR 2.9 on admit.  Goal INR 2.5-3.5 per anticoag office notes Deboraha Sprang at [facility] 02/13/23) -11/18 Vit K given  -11/19 Start heparin drip -11/22 Resume warfarin with INR goal 2-3 per Cardiology   -11/29: Heparin level = 0.42 units/mL, remains therapeutic. Hgb 9.1, remains stable. Pltc 221K, WNL. INR subtherapeutic and decreased to 1.6 today. No bleeding or infusion issues noted per nursing.  Goal of Therapy:  Heparin level 0.3-0.7 units/ml INR 2-3 Monitor platelets by anticoagulation protocol: Yes   Plan:  Continue IV heparin infusion at 1400 units/hr Warfarin 6mg  PO x 1 today Daily CBC, heparin level, PT/INR Monitor closely for s/sx of bleeding    Greer Pickerel, PharmD, BCPS Clinical Pharmacist 03/15/2023,8:33 AM

## 2023-03-15 NOTE — TOC Progression Note (Signed)
Transition of Care [facility]) - Progression Note    Patient Details  Name: Alan Willis MRN: [id number] Date of Birth: Jul 08, 1945  [facility] CM/SW Contact  Otelia Santee, LCSW Phone Number: 03/15/2023, 1:58 PM  Clinical Narrative:    Spoke with pt's wife via t/c to discuss discharge recommendations. Pt's spouse is agreeable to having HH arranged and to have RW ordered. She shares pt has not had HH services in the past and does not have a preference for agency. HHPT has been arranged with [facility]. RW has been ordered through [employer] who will be delivering RW to pt's room prior to discharge.    Expected Discharge Plan: Home/Self Care Barriers to Discharge: Continued Medical Work up  Expected Discharge Plan and Services In-house Referral: NA Discharge Planning Services: CM Consult Post Acute Care Choice: NA Living arrangements for the past 2 months: Single Family Home                 DME Arranged: Walker rolling DME Agency: [facility] Date DME Agency Contacted: 03/15/23 Time DME Agency Contacted: 1357 Representative spoke with at DME Agency: Zack HH Arranged: PT HH Agency: [facility] Date [facility] Agency Contacted: 03/15/23 Time HH Agency Contacted: 1357 Representative spoke with at [facility] Agency: Cindie   Social Determinants of Health (SDOH) Interventions SDOH Screenings   Food Insecurity: No Food Insecurity (03/03/2023)  Housing: Low Risk  (03/03/2023)  Transportation Needs: No Transportation Needs (03/03/2023)  Utilities: Not At Risk (03/03/2023)  Tobacco Use: High Risk (03/05/2023)    Readmission Risk Interventions    03/13/2023    6:32 PM 03/05/2023   12:52 PM  Readmission Risk Prevention Plan  Transportation Screening Complete Complete  PCP or Specialist Appt within 5-7 Days Complete   PCP or Specialist Appt within 3-5 Days  Complete  Home Care Screening Complete   Medication Review (RN CM) Complete   HRI or Home Care Consult  Complete   Social Work Consult for Recovery Care Planning/Counseling  Complete  Palliative Care Screening  Not Applicable  Medication Review Oceanographer)  Complete

## 2023-03-15 NOTE — Progress Notes (Signed)
Rounding Note    Patient Name: Alan Willis Date of Encounter: 03/15/2023  [facility] Cardiologist: [facility]  Subjective   No CP or dyspnea  Inpatient Medications    Scheduled Meds:  sodium chloride   Intravenous Once   sodium chloride   Intravenous Once   Chlorhexidine Gluconate Cloth  6 each Topical Daily   doxycycline  100 mg Oral Q12H   feeding supplement  1 Container Oral TID BM   furosemide  40 mg Oral BID   lidocaine  1 Application Topical Once   metoprolol tartrate  25 mg Oral BID   pantoprazole (PROTONIX) IV  40 mg Intravenous Q12H   phosphorus  500 mg Oral BID   Warfarin - Pharmacist Dosing Inpatient   Does not apply q1600   Continuous Infusions:  cefTRIAXone (ROCEPHIN)  IV Stopped (03/14/23 1243)   heparin 1,400 Units/hr (03/15/23 0447)   levETIRAcetam Stopped (03/14/23 1117)   And   levETIRAcetam 750 mg (03/14/23 2153)   PRN Meds: acetaminophen, acetaminophen, alum & mag hydroxide-simeth, bisacodyl, fentaNYL (SUBLIMAZE) injection, haloperidol lactate, magic mouthwash, menthol-cetylpyridinium, methocarbamol (ROBAXIN) injection, naphazoline-glycerin, mouth rinse, phenol, sodium chloride   Vital Signs    Vitals:   03/14/23 1701 03/14/23 2139 03/15/23 0452 03/15/23 0453  BP: 127/82 (!) 89/44 95/73 95/73   Pulse: (!) 101 98 99 94  Resp: 14 16 18 18   Temp: 97.6 F (36.4 C) 98.1 F (36.7 C) 98.1 F (36.7 C) 98.1 F (36.7 C)  TempSrc: Oral Oral Oral Oral  SpO2: 100% 99% 98% 98%  Weight:      Height:        Intake/Output Summary (Last 24 hours) at 03/15/2023 0752 Last data filed at 03/15/2023 0400 Gross per 24 hour  Intake 1456.75 ml  Output 575 ml  Net 881.75 ml      03/03/2023    1:20 PM 03/03/2023    8:13 AM 12/19/2022    7:55 AM  Last 3 Weights  Weight (lbs) 174 lb 13.2 oz 175 lb 176 lb 12.8 oz  Weight (kg) 79.3 kg 79.379 kg 80.196 kg      Telemetry    Atrial flutter- Personally Reviewed   Physical Exam   GEN: NAD,  WD WN Neck: Supple, no JVD Cardiac: irregular, no rub, 2/6 systolic murmur Respiratory: CTA; no wheeze GI: Soft, NT/ND, no masses MS: No edema Neuro:  No focal findings Psych: Normal affect   Labs    High Sensitivity Troponin:   Recent Labs  Lab 03/03/23 0843 03/03/23 1048  TROPONINIHS 104* 91*     Chemistry Recent Labs  Lab 03/11/23 0325 03/13/23 1638 03/14/23 0311  NA 132* 134* 135  K 3.8 3.7 3.9  CL 100 98 100  CO2 23 24 24   GLUCOSE 115* 134* 96  BUN 12 16 18   CREATININE 1.07 1.29* 1.29*  CALCIUM 8.4* 8.9 8.7*  MG 1.9 2.3 2.4  GFRNONAA >60 57* 57*  ANIONGAP 9 12 11     Hematology Recent Labs  Lab 03/13/23 0541 03/14/23 0015 03/14/23 0311 03/15/23 0435  WBC 8.5  --  9.4 8.2  RBC 2.51*  --  2.89* 2.96*  HGB 7.7* 8.8* 9.1* 9.1*  HCT 24.8* 27.7* 27.1* 28.8*  MCV 98.8  --  93.8 97.3  MCH 30.7  --  31.5 30.7  MCHC 31.0  --  33.6 31.6  RDW 14.6  --  14.8 14.8  PLT 211  --  258 221    BNP Recent Labs  Lab 03/09/23 0310  BNP 471.4*      Radiology    EP STUDY  Result Date: 03/13/2023 See surgical note for result.    Patient Profile     Alan Willis is a 77 y.o. 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 Cardiology was consulted on 03/04/2023 for further evaluation of atrial flutter with RVR.  Echocardiogram this admission shows ejection fraction 40 to 45% with global hypokinesis, moderate RV dysfunction, mild right ventricular enlargement, severe biatrial enlargement, moderate to severe mitral stenosis with mean  gradient 8.2 mmHg, mild to moderate mitral regurgitation, status post aortic valve replacement with mean gradient 9 mmHg and mildly dilated ascending aorta at 39 mm.  CTA November 2024 showed left lower lobe density and follow-up recommended in 3 months; no left atrial appendage thrombus, calcium score 588 which was 66 percentile.  Patient underwent cardioversion on November 27 and converted to sinus rhythm on 2 separate occasions but atrial flutter recurred.  Assessment & Plan    1 Atrial flutter-patient underwent cardioversion attempt on 11/27.  He converted to sinus on 2 separate occasions but atrial flutter recurred.  He therefore has failed Tikosyn.  I did discuss the patient with Dr. Elberta Fortis.  Will initiate amiodarone 400 mg twice daily for 1 week beginning tomorrow.  Would then decrease to 200 mg daily.  Would then proceed with cardioversion attempt in approximately 4 weeks if INR stays therapeutic after discharge (recent CTA showed no left atrial appendage thrombus).  If he does not hold sinus rhythm with need rate control and anticoagulation.  He will need close follow-up with his cardiologist at [facility].    2 Mechanical AVR-continue heparin and coumadin; would like INR greater than 2 prior to discharge..   3 hematuria-FU urology as outpt.  4 Recent GI bleed-s/p transfusion, clipping of MW tears; also with gastric ulcers; continue to follow Hgb.  5 abnormal chest CT-needs fu study 3 months.  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  If heart rate is controlled can likely load amiodarone as an outpatient once INR greater than 2.  For questions or updates, please contact [facility] Please consult [email or web address] for contact info under        Signed, Olga Millers, MD  03/15/2023, 7:52 AM

## 2023-03-16 DIAGNOSIS — I4892 Unspecified atrial flutter: Secondary | ICD-10-CM | POA: Diagnosis not present

## 2023-03-16 DIAGNOSIS — K920 Hematemesis: Secondary | ICD-10-CM | POA: Diagnosis not present

## 2023-03-16 DIAGNOSIS — I4891 Unspecified atrial fibrillation: Secondary | ICD-10-CM | POA: Diagnosis not present

## 2023-03-16 DIAGNOSIS — I5033 Acute on chronic diastolic (congestive) heart failure: Secondary | ICD-10-CM | POA: Diagnosis not present

## 2023-03-16 DIAGNOSIS — N179 Acute kidney failure, unspecified: Secondary | ICD-10-CM | POA: Diagnosis not present

## 2023-03-16 LAB — BASIC METABOLIC PANEL
Anion gap: 10 (ref 5–15)
BUN: 16 mg/dL (ref 8–23)
CO2: 24 mmol/L (ref 22–32)
Calcium: 9 mg/dL (ref 8.9–10.3)
Chloride: 99 mmol/L (ref 98–111)
Creatinine, Ser: 1.39 mg/dL — ABNORMAL HIGH (ref 0.61–1.24)
GFR, Estimated: 53 mL/min — ABNORMAL LOW (ref >60)
Glucose, Bld: 88 mg/dL (ref 70–99)
Potassium: 3.6 mmol/L (ref 3.5–5.1)
Sodium: 133 mmol/L — ABNORMAL LOW (ref 135–145)

## 2023-03-16 LAB — CBC
HCT: 29.8 % — ABNORMAL LOW (ref 39.0–52.0)
Hemoglobin: 9.6 g/dL — ABNORMAL LOW (ref 13.0–17.0)
MCH: 30.7 pg (ref 26.0–34.0)
MCHC: 32.2 g/dL (ref 30.0–36.0)
MCV: 95.2 fL (ref 80.0–100.0)
Platelets: 244 10*3/uL (ref 150–400)
RBC: 3.13 MIL/uL — ABNORMAL LOW (ref 4.22–5.81)
RDW: 14.6 % (ref 11.5–15.5)
WBC: 7.4 10*3/uL (ref 4.0–10.5)
nRBC: 0 % (ref 0.0–0.2)

## 2023-03-16 LAB — PROTIME-INR
INR: 1.7 — ABNORMAL HIGH (ref 0.8–1.2)
Prothrombin Time: 20.5 s — ABNORMAL HIGH (ref 11.4–15.2)

## 2023-03-16 LAB — PHOSPHORUS: Phosphorus: 3.4 mg/dL (ref 2.5–4.6)

## 2023-03-16 LAB — HEPARIN LEVEL (UNFRACTIONATED): Heparin Unfractionated: 0.42 [IU]/mL (ref 0.30–0.70)

## 2023-03-16 MED ORDER — AMIODARONE HCL 200 MG PO TABS
400.0000 mg | ORAL_TABLET | Freq: Two times a day (BID) | ORAL | Status: DC
  Administered 2023-03-16 – 2023-03-17 (×2): 400 mg via ORAL
  Filled 2023-03-16 (×2): qty 2

## 2023-03-16 MED ORDER — AMIODARONE HCL 200 MG PO TABS
200.0000 mg | ORAL_TABLET | Freq: Two times a day (BID) | ORAL | Status: DC
  Administered 2023-03-16: 200 mg via ORAL
  Filled 2023-03-16: qty 1

## 2023-03-16 MED ORDER — FUROSEMIDE 40 MG PO TABS: 40.0000 mg | ORAL_TABLET | Freq: Every day | ORAL | Status: DC

## 2023-03-16 MED ORDER — WARFARIN SODIUM 6 MG PO TABS
6.0000 mg | ORAL_TABLET | Freq: Once | ORAL | Status: AC
  Administered 2023-03-16: 6 mg via ORAL
  Filled 2023-03-16: qty 1

## 2023-03-16 MED ORDER — AMIODARONE HCL 200 MG PO TABS
200.0000 mg | ORAL_TABLET | Freq: Once | ORAL | Status: AC
  Administered 2023-03-16: 200 mg via ORAL
  Filled 2023-03-16: qty 1

## 2023-03-16 NOTE — Progress Notes (Signed)
[facility]                                                                               Alan Willis, is a 77 y.o. male, DOB - 1945/08/28, ZOX:[id number] Admit date - 03/03/2023    Outpatient Primary MD for the patient is Alan Hazel, MD  LOS - 13  days    Brief summary    77 yo M with mech AV on warfarin, p/w GI bleed, SBO, AKI and Afib RVR.   On admission required pressors due to hemorrhagic shock and Afib.  Transferred to ICU and required vasopressors. EGD on 11/19 showed bleeding likely from [facility] tear. Heparin resumed. AKI resolved. Cards, General surgery and GI on board.  Transferred to [facility] 03/06/2023.    Assessment & Plan    Assessment and Plan:   Hemorrhagic shock secondary to acute blood loss secondary to upper GI bleed/Mallory-Weiss tear S/p 4 units of PRBC transfusion on 03/05/2023 S/p 3 units of fresh frozen plasma on 03/04/2023 S/p EGD showing 2 large nonbleeding Mallory-Weiss tear with stigmata of recent bleeding and 2 large clots at side s/p clips x 3. He was also found to have 2 nonbleeding linear and superficial gastric ulcers with a clean ulcer base in the gastric fundus. Gastroenterology recommends PPI twice daily for 2 months followed by daily PPI. He was cleared by gastroenterology to restart anticoagulation.  Hemoglobin stable around 9.1 today. BP parameters are optimal.    Acute anemia of blood loss Hemoglobin stable around 9.    AKI:  Creatinine at baseline around 1, worsened to 1.39.  Stop the lasix and check renal parameters in am.   Hematuria Appears to have resolved. Urology on board, suspect from catheter removal/trauma.   Small bowel obstruction: Surgery managing.  SBO appears to have resolved.  Tube removed 03/05/2023, SBO resolved.  Tolerating regular diet.   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  INR is 1.7 today.     Patient with atrial fibrillation with RVR on admission S/p cardioversion, unfortunately he remains in afib. Cardiology on board  CTA is negative for left atrial appendage thrombus Continue with metoprolol 25 mg BID,  Tikosyn discontinued by cardiology as he remains in afib with cardioversion .  Amiodarone load with 400 mg BID started today.     Acute hypoxic respiratory failure secondary to acute on chronic systolic  congestive heart failure  Appears to have resolved.bump in creatinine to 1.3 will hold off on lasix for today and monitor.   Continue with strict intake and output.  Daily weights.  Pt denies any chest pain or sob or palpitations.     Seizures Continue with Keppra  Delirium  Resolved. He is alert and oriented.    Hypophosphatemia Replaced.    Acute hyponatremia Fluctuating between 130 to 135.     Estimated body mass index is 25.08 kg/m as calculated from the following:   Height as of this encounter: 5\' 10"  (1.778 m).   Weight as of this encounter: 79.3 kg.  Code Status: full code.  DVT Prophylaxis:  SCDs Start: 03/03/23 1402 warfarin (COUMADIN) tablet 6 mg   Level of Care: Level of care: Telemetry Family Communication: Updated patient's family at bedside.   Disposition Plan:     Remains inpatient appropriate:  waiting for INR to be >2.  Procedures:  DCCV   Consultants:   Urology Cardiology Gastroenterology.  Antimicrobials:   Anti-infectives (From admission, onward)    Start     Dose/Rate Route Frequency Ordered Stop   03/13/23 1000  cefTRIAXone (ROCEPHIN) 2 g in sodium chloride 0.9 % 100 mL IVPB        2 g 200 mL/hr over 30 Minutes Intravenous Every 24 hours 03/13/23 0854     03/13/23 1000  doxycycline (VIBRA-TABS) tablet 100 mg        100 mg Oral Every 12 hours 03/13/23 0854          Medications  Scheduled Meds:  amiodarone  200 mg Oral Once   amiodarone  400 mg Oral  BID   Chlorhexidine Gluconate Cloth  6 each Topical Daily   doxycycline  100 mg Oral Q12H   feeding supplement  1 Container Oral TID BM   levETIRAcetam  500 mg Oral Daily   And   levETIRAcetam  750 mg Oral QHS   metoprolol tartrate  25 mg Oral BID   pantoprazole (PROTONIX) IV  40 mg Intravenous Q12H   warfarin  6 mg Oral ONCE-1600   Warfarin - Pharmacist Dosing Inpatient   Does not apply q1600   Continuous Infusions:  cefTRIAXone (ROCEPHIN)  IV 2 g (03/16/23 1045)   heparin 1,400 Units/hr (03/16/23 0303)   PRN Meds:.acetaminophen, acetaminophen, alum & mag hydroxide-simeth, bisacodyl, fentaNYL (SUBLIMAZE) injection, haloperidol lactate, magic mouthwash, menthol-cetylpyridinium, methocarbamol (ROBAXIN) injection, naphazoline-glycerin, mouth rinse, phenol, sodium chloride    Subjective:   Alan Willis was seen and examined today . No new complaints.   Objective:   Vitals:   03/15/23 1314 03/15/23 2021 03/16/23 0556 03/16/23 1315  BP: 117/76 113/68 93/61 120/80  Pulse: 85 98 97 93  Resp: 16  14 18   Temp: 98 F (36.7 C) 97.7 F (36.5 C) 98.2 F (36.8 C) 97.9 F (36.6 C)  TempSrc: Oral Oral  Oral  SpO2: 99% 98% 99% 100%  Weight:      Height:        Intake/Output Summary (Last 24 hours) at 03/16/2023 1436 Last data filed at 03/16/2023 1315 Gross per 24 hour  Intake 1728.93 ml  Output 1150 ml  Net 578.93 ml   Filed Weights   03/03/23 0813 03/03/23 1320  Weight: 79.4 kg 79.3 kg     Exam General exam: Appears calm and comfortable  Respiratory system: Clear to auscultation. Respiratory effort normal. Cardiovascular system: S1 & S2 heard, irregularly irregular.  Gastrointestinal system: Abdomen is nondistended, soft and nontender. Central nervous system: Alert and oriented. No focal neurological deficits. Extremities: Symmetric 5 x 5 power. Skin: No rashes,  Psychiatry: Mood & affect appropriate.       Data Reviewed:  I have personally reviewed following  labs and imaging studies   CBC Lab Results  Component Value Date   WBC 7.4 03/16/2023   RBC 3.13 (L) 03/16/2023   HGB 9.6 (L) 03/16/2023   HCT 29.8 (L) 03/16/2023   MCV 95.2 03/16/2023   MCH 30.7 03/16/2023   PLT 244 03/16/2023   MCHC 32.2 03/16/2023   RDW 14.6 03/16/2023   LYMPHSABS 1.3 03/14/2023   MONOABS 0.7 03/14/2023   EOSABS 0.1  03/14/2023   BASOSABS 0.0 03/14/2023     Last metabolic panel Lab Results  Component Value Date   NA 133 (L) 03/16/2023   K 3.6 03/16/2023   CL 99 03/16/2023   CO2 24 03/16/2023   BUN 16 03/16/2023   CREATININE 1.39 (H) 03/16/2023   GLUCOSE 88 03/16/2023   GFRNONAA 53 (L) 03/16/2023   GFRAA  12/29/2009    >60        The eGFR has been calculated using the MDRD equation. This calculation has not been validated in all clinical situations. eGFR's persistently <60 mL/min signify possible Chronic Kidney Disease.   CALCIUM 9.0 03/16/2023   PHOS 3.4 03/16/2023   PROT 5.6 (L) 03/04/2023   ALBUMIN 3.6 03/04/2023   BILITOT 2.3 (H) 03/04/2023   ALKPHOS 30 (L) 03/04/2023   AST 26 03/04/2023   ALT 13 03/04/2023   ANIONGAP 10 03/16/2023    CBG (last 3)  No results for input(s): "GLUCAP" in the last 72 hours.    Coagulation Profile: Recent Labs  Lab 03/12/23 0251 03/13/23 0541 03/14/23 0311 03/15/23 0435 03/16/23 0502  INR 1.4* 1.5* 1.7* 1.6* 1.7*     Radiology Studies: No results found.     Kathlen Mody M.D. [facility] 03/16/2023, 2:36 PM  Available via Epic secure chat 7am-7pm After 7 pm, please refer to night coverage provider listed on amion.

## 2023-03-16 NOTE — Progress Notes (Addendum)
Rounding Note    Patient Name: Alan Willis Date of Encounter: 03/16/2023  [facility] Cardiologist: [facility]  Subjective   No complaints  Inpatient Medications    Scheduled Meds:  sodium chloride   Intravenous Once   sodium chloride   Intravenous Once   Chlorhexidine Gluconate Cloth  6 each Topical Daily   doxycycline  100 mg Oral Q12H   feeding supplement  1 Container Oral TID BM   furosemide  40 mg Oral BID   levETIRAcetam  500 mg Oral Daily   And   levETIRAcetam  750 mg Oral QHS   lidocaine  1 Application Topical Once   metoprolol tartrate  25 mg Oral BID   pantoprazole (PROTONIX) IV  40 mg Intravenous Q12H   Warfarin - Pharmacist Dosing Inpatient   Does not apply q1600   Continuous Infusions:  cefTRIAXone (ROCEPHIN)  IV Stopped (03/15/23 1041)   heparin 1,400 Units/hr (03/16/23 0303)   PRN Meds: acetaminophen, acetaminophen, alum & mag hydroxide-simeth, bisacodyl, fentaNYL (SUBLIMAZE) injection, haloperidol lactate, magic mouthwash, menthol-cetylpyridinium, methocarbamol (ROBAXIN) injection, naphazoline-glycerin, mouth rinse, phenol, sodium chloride   Vital Signs    Vitals:   03/15/23 0453 03/15/23 1314 03/15/23 2021 03/16/23 0556  BP: 95/73 117/76 113/68 93/61  Pulse: 94 85 98 97  Resp: 18 16  14   Temp: 98.1 F (36.7 C) 98 F (36.7 C) 97.7 F (36.5 C) 98.2 F (36.8 C)  TempSrc: Oral Oral Oral   SpO2: 98% 99% 98% 99%  Weight:      Height:        Intake/Output Summary (Last 24 hours) at 03/16/2023 0748 Last data filed at 03/16/2023 0556 Gross per 24 hour  Intake 2091.99 ml  Output 1750 ml  Net 341.99 ml      03/03/2023    1:20 PM 03/03/2023    8:13 AM 12/19/2022    7:55 AM  Last 3 Weights  Weight (lbs) 174 lb 13.2 oz 175 lb 176 lb 12.8 oz  Weight (kg) 79.3 kg 79.379 kg 80.196 kg      Telemetry    Parxysmal aflutter - Personally Reviewed  ECG    N/a - Personally Reviewed  Physical Exam   GEN: No acute  distress.   Neck: No JVD Cardiac: irreg, mechanical S2, 2/6 systolic murmur rusb Respiratory: Clear to auscultation bilaterally. GI: Soft, nontender, non-distended  MS: No edema; No deformity. Neuro:  Nonfocal  Psych: Normal affect   Labs    High Sensitivity Troponin:   Recent Labs  Lab 03/03/23 0843 03/03/23 1048  TROPONINIHS 104* 91*     Chemistry Recent Labs  Lab 03/11/23 0325 03/13/23 1638 03/14/23 0311 03/16/23 0502  NA 132* 134* 135 133*  K 3.8 3.7 3.9 3.6  CL 100 98 100 99  CO2 23 24 24 24   GLUCOSE 115* 134* 96 88  BUN 12 16 18 16   CREATININE 1.07 1.29* 1.29* 1.39*  CALCIUM 8.4* 8.9 8.7* 9.0  MG 1.9 2.3 2.4  --   GFRNONAA >60 57* 57* 53*  ANIONGAP 9 12 11 10     Lipids No results for input(s): "CHOL", "TRIG", "HDL", "LABVLDL", "LDLCALC", "CHOLHDL" in the last 168 hours.  Hematology Recent Labs  Lab 03/14/23 0311 03/15/23 0435 03/16/23 0502  WBC 9.4 8.2 7.4  RBC 2.89* 2.96* 3.13*  HGB 9.1* 9.1* 9.6*  HCT 27.1* 28.8* 29.8*  MCV 93.8 97.3 95.2  MCH 31.5 30.7 30.7  MCHC 33.6 31.6 32.2  RDW 14.8 14.8  14.6  PLT 258 221 244   Thyroid No results for input(s): "TSH", "FREET4" in the last 168 hours.  BNPNo results for input(s): "BNP", "PROBNP" in the last 168 hours.  DDimer No results for input(s): "DDIMER" in the last 168 hours.   Radiology    No results found.  Cardiac Studies    Patient Profile     Alan Willis is a 77 y.o. 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 Cardiology was consulted on 03/04/2023 for  further evaluation of atrial flutter with RVR. Echocardiogram this admission shows ejection fraction 40 to 45% with global hypokinesis, moderate RV dysfunction, mild right ventricular enlargement, severe biatrial enlargement, moderate to severe mitral stenosis with mean gradient 8.2 mmHg, mild to moderate mitral regurgitation, status post aortic valve replacement with mean gradient 9 mmHg and mildly dilated ascending aorta at 39 mm. CTA November 2024 showed left lower lobe density and follow-up recommended in 3 months; no left atrial appendage thrombus, calcium score 588 which was 66 percentile. Patient underwent cardioversion on November 27 and converted to sinus rhythm on 2 separate occasions but atrial flutter recurred.   Assessment & Plan    1.Aflutter - history of afib/aflutter with multiple DCCVs in the past. Has been on tikosyn, coumadin - DCCV attempt 03/13/23, converted to SR but quickly reverted back to aflutter - essentially has failed tikosyn. Discontinued this admission - from Dr Ludwig Clarks note after discussions with EP started on amio 400mg  bid x 7 days, then 200mg  daily. Consider repeat DCCV in next several weeks  -on lopressor 25mg  bid. Start oral amio 400mg  bid today.  - INR subtherapeutic in setting of afib, mechanical AVR. Currently on hep gtt.   2. Mechanical AVR - 02/2023 echo: LVEF 40-45%, AVR mean grad 9 - on coumadin, dosing per pharmacy - on hep gtt, INR is 1.7   3.Hematuria - followed by urology  4. Recent GI bleed - clipping of MW tears; also with gastric ulcers  - cleared by GI to restart anticoag  5. HFrEF - 07/2022 echo at [facility]: LVEF 55-60% - 02/2023 echo: LVEF 40-45% - drop in LVEF may be rate related - soft bp's limit medical therapy. Transition lopressor to toprol once rate controlled. Consider SGLT2i closer to discharge and see if can add back his aldactone closer to d/c. Don't think bp's would tolerate ACE/ARB/ARNI at this time - on oral lasix 40mg   bid, up trend in Cr. Lower to 40mg  daily   For questions or updates, please contact [facility] Please consult [email or web address] for contact info under        Signed, Dina Rich, MD  03/16/2023, 7:48 AM

## 2023-03-16 NOTE — Plan of Care (Signed)

## 2023-03-16 NOTE — Progress Notes (Signed)
PHARMACY - ANTICOAGULATION CONSULT NOTE  Pharmacy Consult for Heparin/Warfarin Indication: mechanical AVR and Afib   Allergies  Allergen Reactions   Amoxicillin-Pot Clavulanate Rash   Codeine Other (See Comments)    Stomach upset    Patient Measurements: Height: 5\' 10"  (177.8 cm) Weight: 79.3 kg (174 lb 13.2 oz) IBW/kg (Calculated) : 73 Heparin dosing weight: total body weight   Vital Signs: Temp: 97.9 F (36.6 C) (11/30 1315) Temp Source: Oral (11/30 1315) BP: 120/80 (11/30 1315) Pulse Rate: 93 (11/30 1315)  Labs: Recent Labs    03/13/23 1638 03/14/23 0015 03/14/23 0311 03/14/23 1103 03/15/23 0435 03/16/23 0502  HGB  --    < > 9.1*  --  9.1* 9.6*  HCT  --    < > 27.1*  --  28.8* 29.8*  PLT  --   --  258  --  221 244  LABPROT  --   --  19.8*  --  19.6* 20.5*  INR  --   --  1.7*  --  1.6* 1.7*  HEPARINUNFRC 0.76*  --  0.40 0.51 0.42 0.42  CREATININE 1.29*  --  1.29*  --   --  1.39*   < > = values in this interval not displayed.    Estimated Creatinine Clearance: 46.7 mL/min (A) (by C-G formula based on SCr of 1.39 mg/dL (H)).   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  LD on 11/16.  -INR 2.9 on admit.  Goal INR 2.5-3.5 per anticoag office notes Deboraha Sprang at [facility] 02/13/23) -11/18 Vit K given  -11/19 Start heparin drip -11/22  Resume warfarin with INR goal 2-3 per Cardiology  -11/30: Heparin level = 0.42 units/mL, remains therapeutic. Hgb 9.6, remains stable. Pltc 244K, WNL. INR subtherapeutic but increased to 1.7 today. No bleeding or infusion issues noted per nursing. Noted initiation of amiodarone, which is major drug interaction with warfarin (will likely result in overall weekly dose reduction of warfarin).   Goal of Therapy:  Heparin level 0.3-0.7 units/ml INR 2-3 Monitor platelets by anticoagulation protocol: Yes   Plan:  Continue IV heparin infusion at 1400 units/hr Warfarin 6mg  PO x 1 today Daily CBC, heparin level, PT/INR Monitor closely for s/sx of bleeding    Greer Pickerel, PharmD, BCPS Clinical Pharmacist 03/16/2023,1:35 PM

## 2023-03-16 NOTE — Progress Notes (Signed)
Mobility Specialist - Progress Note   03/16/23 0857  Mobility  Activity Ambulated with assistance in hallway  Level of Assistance Modified independent, requires aide device or extra time  Assistive Device Front wheel walker  Distance Ambulated (ft) 600 ft  Activity Response Tolerated well  Mobility Referral Yes  $Mobility charge 1 Mobility  Mobility Specialist Start Time (ACUTE ONLY) [id number]  Mobility Specialist Stop Time (ACUTE ONLY) 0845  Mobility Specialist Time Calculation (min) (ACUTE ONLY) 12 min   Pt received in recliner and agreeable to mobility.  No complaints during session. Pt to recliner after session with all needs met. Chair alarm on.     [facility]

## 2023-03-17 DIAGNOSIS — I4892 Unspecified atrial flutter: Secondary | ICD-10-CM | POA: Diagnosis not present

## 2023-03-17 LAB — PROTIME-INR
INR: 2 — ABNORMAL HIGH (ref 0.8–1.2)
Prothrombin Time: 23.1 s — ABNORMAL HIGH (ref 11.4–15.2)

## 2023-03-17 LAB — BASIC METABOLIC PANEL
Anion gap: 8 (ref 5–15)
BUN: 15 mg/dL (ref 8–23)
CO2: 23 mmol/L (ref 22–32)
Calcium: 8.6 mg/dL — ABNORMAL LOW (ref 8.9–10.3)
Chloride: 98 mmol/L (ref 98–111)
Creatinine, Ser: 1.31 mg/dL — ABNORMAL HIGH (ref 0.61–1.24)
GFR, Estimated: 56 mL/min — ABNORMAL LOW (ref >60)
Glucose, Bld: 99 mg/dL (ref 70–99)
Potassium: 3.6 mmol/L (ref 3.5–5.1)
Sodium: 129 mmol/L — ABNORMAL LOW (ref 135–145)

## 2023-03-17 LAB — CBC
HCT: 27.4 % — ABNORMAL LOW (ref 39.0–52.0)
Hemoglobin: 9 g/dL — ABNORMAL LOW (ref 13.0–17.0)
MCH: 31.1 pg (ref 26.0–34.0)
MCHC: 32.8 g/dL (ref 30.0–36.0)
MCV: 94.8 fL (ref 80.0–100.0)
Platelets: 226 10*3/uL (ref 150–400)
RBC: 2.89 MIL/uL — ABNORMAL LOW (ref 4.22–5.81)
RDW: 14.4 % (ref 11.5–15.5)
WBC: 6.5 10*3/uL (ref 4.0–10.5)
nRBC: 0 % (ref 0.0–0.2)

## 2023-03-17 LAB — HEPARIN LEVEL (UNFRACTIONATED): Heparin Unfractionated: 0.37 [IU]/mL (ref 0.30–0.70)

## 2023-03-17 MED ORDER — METOPROLOL SUCCINATE ER 25 MG PO TB24: 25.0000 mg | ORAL_TABLET | Freq: Every day | ORAL | Status: DC

## 2023-03-17 MED ORDER — EMPAGLIFLOZIN 10 MG PO TABS: 10.0000 mg | ORAL_TABLET | Freq: Every day | ORAL | 1 refills | Status: DC

## 2023-03-17 MED ORDER — PANTOPRAZOLE SODIUM 40 MG PO TBEC: 40.0000 mg | DELAYED_RELEASE_TABLET | Freq: Two times a day (BID) | ORAL | 1 refills | Status: DC

## 2023-03-17 MED ORDER — FUROSEMIDE 40 MG PO TABS: 40.0000 mg | ORAL_TABLET | Freq: Every morning | ORAL | 0 refills | Status: DC

## 2023-03-17 MED ORDER — METOPROLOL SUCCINATE ER 25 MG PO TB24: 12.5000 mg | ORAL_TABLET | Freq: Every day | ORAL | 0 refills | Status: DC

## 2023-03-17 MED ORDER — METOPROLOL SUCCINATE ER 25 MG PO TB24
12.5000 mg | ORAL_TABLET | Freq: Every day | ORAL | Status: DC
  Administered 2023-03-17: 12.5 mg via ORAL
  Filled 2023-03-17: qty 1

## 2023-03-17 MED ORDER — AMIODARONE HCL 200 MG PO TABS: 200.0000 mg | ORAL_TABLET | Freq: Every day | ORAL | 2 refills | Status: DC

## 2023-03-17 MED ORDER — AMIODARONE HCL 400 MG PO TABS: 400.0000 mg | ORAL_TABLET | Freq: Two times a day (BID) | ORAL | 0 refills | Status: DC

## 2023-03-17 MED ORDER — DOXYCYCLINE HYCLATE 100 MG PO TABS: 100.0000 mg | ORAL_TABLET | Freq: Two times a day (BID) | ORAL | 0 refills | Status: AC

## 2023-03-17 MED ORDER — EMPAGLIFLOZIN 10 MG PO TABS
10.0000 mg | ORAL_TABLET | Freq: Every day | ORAL | Status: DC
  Administered 2023-03-17: 10 mg via ORAL
  Filled 2023-03-17: qty 1

## 2023-03-17 NOTE — TOC Transition Note (Signed)
Transition of Care [facility]) - CM/SW Discharge Note   Patient Details  Name: RUBY WHEELHOUSE MRN: [id number] Date of Birth: March 05, 1946  Transition of Care [facility]) CM/SW Contact:  Adrian Prows, RN Phone Number: 03/17/2023, 11:48 AM   Clinical Narrative:    D/C orders received; HHPT services previously arranged w/ [facility]; Cory from agency notified of d/c; DME from [employer] previously delivered to room; no TOC needs   Final next level of care: Home w Home Health Services Barriers to Discharge: No Barriers Identified   Patient Goals and CMS Choice CMS Medicare.gov Compare Post Acute Care list provided to:: Patient Represenative (must comment) (Spouse: [name]) Choice offered to / list presented to : NA  Discharge Placement                         Discharge Plan and Services Additional resources added to the After Visit Summary for   In-house Referral: NA Discharge Planning Services: CM Consult Post Acute Care Choice: NA          DME Arranged: Walker rolling DME Agency: [employer] Date DME Agency Contacted: 03/15/23 Time DME Agency Contacted: 1357 Representative spoke with at DME Agency: Zack HH Arranged: PT HH Agency: [facility] Date [facility] Agency Contacted: 03/15/23 Time HH Agency Contacted: 1357 Representative spoke with at [facility] Agency: Cindie  Social Determinants of Health (SDOH) Interventions SDOH Screenings   Food Insecurity: No Food Insecurity (03/03/2023)  Housing: Low Risk  (03/03/2023)  Transportation Needs: No Transportation Needs (03/03/2023)  Utilities: Not At Risk (03/03/2023)  Tobacco Use: High Risk (03/05/2023)     Readmission Risk Interventions    03/13/2023    6:32 PM 03/05/2023   12:52 PM  Readmission Risk Prevention Plan  Transportation Screening Complete Complete  PCP or Specialist Appt within 5-7 Days Complete   PCP or Specialist Appt within 3-5 Days  Complete  Home Care Screening Complete   Medication Review  (RN CM) Complete   HRI or Home Care Consult  Complete  Social Work Consult for Recovery Care Planning/Counseling  Complete  Palliative Care Screening  Not Applicable  Medication Review [employer])  Complete

## 2023-03-17 NOTE — Progress Notes (Signed)
Per Dr. Blake Divine, patient wish to switch cardiologist to [facility], follow up made in our [place] office

## 2023-03-17 NOTE — Progress Notes (Signed)
 Reviewed d/c instructions with pt. All questions answered. Pt taken to front entrance via wheelchair to meet ride.

## 2023-03-17 NOTE — Progress Notes (Addendum)
Rounding Note    Patient Name: Alan Willis Date of Encounter: 03/17/2023  [facility] Cardiologist: [facility]  Subjective   No complaints  Inpatient Medications    Scheduled Meds:  amiodarone  400 mg Oral BID   Chlorhexidine Gluconate Cloth  6 each Topical Daily   doxycycline  100 mg Oral Q12H   feeding supplement  1 Container Oral TID BM   levETIRAcetam  500 mg Oral Daily   And   levETIRAcetam  750 mg Oral QHS   metoprolol tartrate  25 mg Oral BID   pantoprazole (PROTONIX) IV  40 mg Intravenous Q12H   Warfarin - Pharmacist Dosing Inpatient   Does not apply q1600   Continuous Infusions:  cefTRIAXone (ROCEPHIN)  IV 2 g (03/16/23 1045)   heparin 1,400 Units/hr (03/16/23 1706)   PRN Meds: acetaminophen, acetaminophen, alum & mag hydroxide-simeth, bisacodyl, fentaNYL (SUBLIMAZE) injection, haloperidol lactate, magic mouthwash, menthol-cetylpyridinium, methocarbamol (ROBAXIN) injection, naphazoline-glycerin, mouth rinse, phenol, sodium chloride   Vital Signs    Vitals:   03/16/23 0556 03/16/23 1315 03/16/23 2029 03/17/23 0552  BP: 93/61 120/80 (!) 113/58 (!) 98/58  Pulse: 97 93 72 65  Resp: 14 18 18 18   Temp: 98.2 F (36.8 C) 97.9 F (36.6 C) 98.9 F (37.2 C) 98.4 F (36.9 C)  TempSrc:  Oral Oral Oral  SpO2: 99% 100% 100% 99%  Weight:      Height:        Intake/Output Summary (Last 24 hours) at 03/17/2023 0733 Last data filed at 03/17/2023 2841 Gross per 24 hour  Intake 1916.33 ml  Output --  Net 1916.33 ml      03/03/2023    1:20 PM 03/03/2023    8:13 AM 12/19/2022    7:55 AM  Last 3 Weights  Weight (lbs) 174 lb 13.2 oz 175 lb 176 lb 12.8 oz  Weight (kg) 79.3 kg 79.379 kg 80.196 kg      Telemetry    SR - Personally Reviewed  ECG    N/a - Personally Reviewed  Physical Exam   GEN: No acute distress.   Neck: No JVD Cardiac: RRR, no murmurs, rubs, or gallops.  Respiratory: Clear to auscultation bilaterally. GI: Soft, nontender,  non-distended  MS: No edema; No deformity. Neuro:  Nonfocal  Psych: Normal affect   Labs    High Sensitivity Troponin:   Recent Labs  Lab 03/03/23 0843 03/03/23 1048  TROPONINIHS 104* 91*     Chemistry Recent Labs  Lab 03/11/23 0325 03/13/23 1638 03/14/23 0311 03/16/23 0502  NA 132* 134* 135 133*  K 3.8 3.7 3.9 3.6  CL 100 98 100 99  CO2 23 24 24 24   GLUCOSE 115* 134* 96 88  BUN 12 16 18 16   CREATININE 1.07 1.29* 1.29* 1.39*  CALCIUM 8.4* 8.9 8.7* 9.0  MG 1.9 2.3 2.4  --   GFRNONAA >60 57* 57* 53*  ANIONGAP 9 12 11 10     Lipids No results for input(s): "CHOL", "TRIG", "HDL", "LABVLDL", "LDLCALC", "CHOLHDL" in the last 168 hours.  Hematology Recent Labs  Lab 03/15/23 0435 03/16/23 0502 03/17/23 0420  WBC 8.2 7.4 6.5  RBC 2.96* 3.13* 2.89*  HGB 9.1* 9.6* 9.0*  HCT 28.8* 29.8* 27.4*  MCV 97.3 95.2 94.8  MCH 30.7 30.7 31.1  MCHC 31.6 32.2 32.8  RDW 14.8 14.6 14.4  PLT 221 244 226   Thyroid No results for input(s): "TSH", "FREET4" in the last 168 hours.  BNPNo results for input(s): "  BNP", "PROBNP" in the last 168 hours.  DDimer No results for input(s): "DDIMER" in the last 168 hours.   Radiology    No results found.  Cardiac Studies    Patient Profile     Alan Willis is a 77 y.o. 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 Cardiology was consulted on 03/04/2023 for further evaluation of atrial flutter with RVR. 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 CTA November 2024 showed left lower lobe density and follow-up recommended in 3 months; no left atrial appendage thrombus, calcium score 588 which was 66 percentile. Patient underwent cardioversion on November 27 and converted to sinus rhythm on 2 separate occasions but atrial flutter recurred.   Assessment & Plan    1.Aflutter - history of afib/aflutter with multiple DCCVs in the past. Has been on tikosyn, coumadin - DCCV attempt 03/13/23, converted to SR but quickly reverted back to aflutter - essentially has failed tikosyn. Discontinued this admission - from Dr Ludwig Clarks note after discussions with EP started on amio 400mg  bid x 7 days, then 200mg  daily.  - tele shows he is is in SR this morning.    -on lopressor 25mg  bid. Started oral amio 400mg  bid yesterday - INR is 2, can d/c IV heparin.    2. Mechanical AVR - 02/2023 echo: LVEF 40-45%, AVR mean grad 9 - on coumadin, dosing per pharmacy - on hep gtt, INR is 2 today. Will d/c IV heparin. - with recent GI bleed and mechanical valve coumadin only, not on ASA     3.Hematuria - followed by urology   4. Recent GI bleed - clipping of MW tears; also with gastric ulcers  - cleared by GI to restart anticoag   5. HFrEF - 07/2022 echo at [facility]: LVEF 55-60% - 02/2023 echo: LVEF 40-45% - drop in LVEF may be rate related - soft bp's limit medical therapy. Transition lopressor to toprol 12.5mg  daily, lower dose due to first degree AV block. Don't think bp's would tolerate ACE/ARB/ARNI/MRA at this time. Add jardiance 10mg  daily.  -lasix on hold given AKI, would resume at 40mg  daily once renal function at baseline.    No additional cardiology recs at this time, we will  sign off inpatient care. He has f/u with [facility] cards later this month. Amio would be 400mg  bid additional 6 days then 200mg  daily. BMET pending this AM, Cr back to baseline would start lasix 40mg  daily, if not wait additional 1-2 days before starting.     For questions or updates, please contact [facility] Please consult [email or web address] for contact info under        Signed, Dina Rich, MD  03/17/2023, 7:33 AM

## 2023-03-19 DIAGNOSIS — I4892 Unspecified atrial flutter: Secondary | ICD-10-CM | POA: Diagnosis not present

## 2023-03-19 DIAGNOSIS — Z8719 Personal history of other diseases of the digestive system: Secondary | ICD-10-CM | POA: Diagnosis not present

## 2023-03-19 DIAGNOSIS — Z7901 Long term (current) use of anticoagulants: Secondary | ICD-10-CM | POA: Diagnosis not present

## 2023-03-19 NOTE — Discharge Summary (Signed)
Physician Discharge Summary   Patient: Alan Willis MRN: [id number] DOB: 1945/11/27  Admit date:     03/03/2023  Discharge date: 03/17/2023  Discharge Physician: Kathlen Mody   PCP: [name], MD   Recommendations at discharge:  Please follow up with PCP in one week.  Please check cbc and bmp in one week.   Discharge Diagnoses: Principal Problem:   GI bleed Active Problems:   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 Mallie Mussel)   Chronic anticoagulation   Memory loss   History of seizures   AKI (acute kidney injury) (HCC)   Gallstone   CKD (chronic kidney disease) stage 2, GFR 60-89 ml/min   Elevated troponin   Recurrent right inguinal hernia   Atrial fibrillation with RVR (HCC)   Diverticulosis of colon with hemorrhage   Small bowel obstruction (HCC)   Shock (HCC)   Acute on chronic heart failure with preserved ejection fraction (HFpEF) (HCC)   Atrial flutter (HCC)  Resolved Problems:   * No resolved hospital problems. *  Hospital Course:  77 yo M with mech AV on warfarin, p/w GI bleed, SBO, AKI and Afib RVR.   On admission required pressors due to hemorrhagic shock and Afib.  Transferred to ICU and required vasopressors. EGD on 11/19 showed bleeding likely from [facility] tear. Heparin resumed. AKI resolved. Cards, General surgery and GI on board.  Transferred to [facility] 03/06/2023.    Assessment and Plan:  Hemorrhagic shock secondary to acute blood loss secondary to upper GI bleed/Mallory-Weiss tear S/p 4 units of PRBC transfusion on 03/05/2023 S/p 3 units of fresh frozen plasma on 03/04/2023 S/p EGD showing 2 large nonbleeding Mallory-Weiss tear with stigmata of recent bleeding and 2 large clots at side s/p clips x 3. He was also found to have 2 nonbleeding linear and superficial gastric ulcers with a clean ulcer base in the gastric fundus. Gastroenterology recommends PPI twice daily for 2 months followed by daily PPI. He was  cleared by gastroenterology to restart anticoagulation.  Hemoglobin stable around 9.1 today. BP parameters are optimal.      Acute anemia of blood loss Hemoglobin stable around 9.      AKI:  Creatinine at 1.3,from a baseline of 1. Recommend checking creatinine in one week.  Holding lasix for 3 days.    Hematuria Appears to have resolved. Urology on board, suspect from catheter removal/trauma.     Small bowel obstruction: Surgery managing.  SBO appears to have resolved.  Tube removed 03/05/2023, SBO resolved.  Tolerating regular diet.     History of mechanical aortic valve replacement on chronic anticoagulation with Coumadin and supratherapeutic INR Continue with Coumadin INR is 2       Patient with atrial fibrillation with RVR on admission S/p cardioversion, unfortunately he remains in afib. Cardiology on board  CTA is negative for left atrial appendage thrombus Continue with metoprolol 25 mg BID,  Tikosyn discontinued by cardiology as he remains in afib with cardioversion .  Amiodarone load with 400 mg BID started  and transitioned to 200mg  daily.        Acute hypoxic respiratory failure secondary to acute on chronic systolic  congestive heart failure  Appears to have resolved.bump in creatinine to 1.3 ,  recommend holding lasix for 3 days.   Continue with strict intake and output.  Daily weights.  Pt denies any chest pain or sob or palpitations.        Seizures Continue with  Keppra   Delirium  Resolved. He is alert and oriented.      Hypophosphatemia Replaced.      Acute hyponatremia Fluctuating between 130 to 135.      Consultants: cardiology Procedures performed: cardioversion.   Disposition: Home Diet recommendation:  Discharge Diet Orders (From admission, onward)     Start     Ordered   03/17/23 0000  Diet - low sodium heart healthy        03/17/23 1059           Regular diet DISCHARGE MEDICATION: Allergies as of 03/17/2023        Reactions   Amoxicillin-pot Clavulanate Rash   Codeine Other (See Comments)   Stomach upset        Medication List     STOP taking these medications    dofetilide 250 MCG capsule Commonly known as: TIKOSYN   spironolactone 25 MG tablet Commonly known as: ALDACTONE       TAKE these medications    amiodarone 400 MG tablet Commonly known as: PACERONE Take 1 tablet (400 mg total) by mouth 2 (two) times daily for 6 days.   amiodarone 200 MG tablet Commonly known as: Pacerone Take 1 tablet (200 mg total) by mouth daily. Start taking on: March 24, 2023   doxycycline 100 MG tablet Commonly known as: VIBRA-TABS Take 100 mg by mouth See admin instructions. Take 100 mg by mouth 30-60 minutes prior to dental procedures What changed: Another medication with the same name was added. Make sure you understand how and when to take each.   doxycycline 100 MG tablet Commonly known as: VIBRA-TABS Take 1 tablet (100 mg total) by mouth 2 (two) times daily for 2 days. What changed: You were already taking a medication with the same name, and this prescription was added. Make sure you understand how and when to take each.   empagliflozin 10 MG Tabs tablet Commonly known as: JARDIANCE Take 1 tablet (10 mg total) by mouth daily.   ferrous sulfate 325 (65 FE) MG tablet Take 325 mg by mouth daily with breakfast.   furosemide 40 MG tablet Commonly known as: LASIX Take 1 tablet (40 mg total) by mouth in the morning. Start taking on: March 20, 2023   Imodium A-D 2 MG tablet Generic drug: loperamide Take 2 mg by mouth 4 (four) times daily as needed for diarrhea or loose stools.   levETIRAcetam 500 MG tablet Commonly known as: Keppra Take 1 tablet in AM and 1 and 1/2 tablets in PM What changed:  how much to take how to take this when to take this additional instructions   magnesium oxide 400 (240 Mg) MG tablet Commonly known as: MAG-OX Take 400 mg by mouth daily.    metoprolol succinate 25 MG 24 hr tablet Commonly known as: TOPROL-XL Take 0.5 tablets (12.5 mg total) by mouth daily.   pantoprazole 40 MG tablet Commonly known as: Protonix Take 1 tablet (40 mg total) by mouth 2 (two) times daily.   TYLENOL 500 MG tablet Generic drug: acetaminophen Take 500 mg by mouth every 6 (six) hours as needed for mild pain (pain score 1-3) or headache.   Vitamin D3 1000 units Caps Take 1,000 Units by mouth daily.   warfarin 6 MG tablet Commonly known as: COUMADIN Take 3-6 mg by mouth See admin instructions. Take 6 mg by mouth in the morning on Sun/Mon/Wed/Thurs/Fri/Sat and 3 mg on Tues        Follow-up Information  [facility] Follow up.   Specialty: Home Health Services Why: Frances Furbish will follow up with you at discharge to provide home health physical therapy Contact information: [address] [place] [address] [phone]         Azalee Course, PA Follow up on 03/22/2023.   Specialties: Cardiology, Radiology Why: 1:55PM. Cardiology follow up Contact information: [address] [place] [address] [phone]         [name], MD. Schedule an appointment as soon as possible for a visit in 1 week(s).   Specialty: Family Medicine Contact information: [address] [place] [address] [phone]                Discharge Exam: Ceasar Mons Weights   03/03/23 0813 03/03/23 1320  Weight: 79.4 kg 79.3 kg   General exam: Appears calm and comfortable  Respiratory system: Clear to auscultation. Respiratory effort normal. Cardiovascular system: S1 & S2 heard, irregularly irregular. Marland Kitchen No JVD, Gastrointestinal system: Abdomen is nondistended, soft and nontender.  Central nervous system: Alert and oriented. No focal neurological deficits. Extremities: Symmetric 5 x 5 power. Skin: No rashes, lesions or ulcers Psychiatry: Judgement and insight appear normal. Mood & affect appropriate.     Condition at discharge: fair  The results of significant diagnostics from this hospitalization (including imaging, microbiology, ancillary and laboratory) are listed below for reference.   Imaging Studies: EP STUDY  Result Date: 03/13/2023 See surgical note for result.  CT CARDIAC MORPH/PULM VEIN W/CM&W/O CA SCORE  Addendum Date: 03/12/2023   ADDENDUM REPORT: 03/12/2023 20:14 EXAM: OVER-READ INTERPRETATION  CT CHEST The following report is an over-read performed by radiologist Dr. Elon Jester [facility] on 03/12/2023. This over-read does not include interpretation of cardiac or coronary anatomy or pathology. The coronary CTA interpretation by the cardiologist is attached. COMPARISON:  None. FINDINGS: Mild global cardiomegaly with left atrial enlargement. Extensive calcification of the aortic and mitral valve leaflets. No pericardial effusion. Visualized thoracic aorta is unremarkable. Linear metallic density within the distal esophagus just proximal to the gastroesophageal junction may represent an endoscopic clip. Subpleural consolidation within the posterior basal left lower lobe may reflect changes of acute lobar pneumonia in the acute setting, resolving pulmonary infarct or pneumonic consolidation is subacute setting, or post inflammatory or post traumatic fibrotic change if chronic. No focal nodules. Trace left pleural effusion. No acute bone abnormality. No lytic or blastic bone lesion. No chest wall mass. IMPRESSION: 1. Mild global cardiomegaly with left atrial enlargement. 2. Subpleural consolidation within the posterior basal left lower lobe may reflect changes of acute lobar pneumonia in the acute setting, resolving pulmonary infarct or pneumonic consolidation is subacute setting, or post inflammatory or post traumatic fibrotic change if chronic. Follow-up imaging in 3 months would be helpful in documenting resolution or stability. 3. Trace left pleural effusion. Aortic  Atherosclerosis (ICD10-I70.0). Electronically Signed   By: Helyn Numbers M.D.   On: 03/12/2023 20:14   Result Date: 03/12/2023 CLINICAL DATA:  33M with atrial fibrillation and GI bleed scheduled for DCCV. EXAM: Cardiac CTA TECHNIQUE: A non-contrast, gated CT scan was obtained with axial slices of 3 mm through the heart for calcium scoring. Calcium scoring was performed using the Agatston method. A 120 kV retrospective, gated, contrast cardiac scan was obtained. Gantry rotation speed was 250 msecs and collimation was 0.6 mm. Nitroglycerin was not given. A delayed scan was obtained to exclude left atrial appendage thrombus. The 3D dataset was reconstructed  in 5% intervals of the 0-95% of the R-R cycle. Late systolic phases were analyzed on a dedicated workstation using MPR, MIP, and VRT modes. The patient received 80 cc of contrast. FINDINGS: Image quality: Excellent. Noise artifact is: Limited. Pulmonary Veins: There is normal pulmonary vein drainage into the left atrium (2 on the right and 2 on the left). RUPV: 26.6 x 22.1 mm RLPV: 27.7 x 25.3 mm LUPV: 19.5 x 17.4 mm LLPV: 23.6 x 21.2 mm Left Atrium: The left atrial size is normal. There is no PFO/ASD. The left atrial appendage is large broccoli type without thrombus on contrast or delayed imaging. The esophagus runs in the left atrial midline and is not in proximity to any of the pulmonary vein ostia. Coronary Arteries: CAC score of 588, which is 66th percentile for age-, race-, and sex-matched controls. Normal coronary origin. Right dominance. The study was performed without use of NTG and is insufficient for plaque evaluation. Right Atrium: Right atrial size is within normal limits. Right Ventricle: The right ventricular cavity is within normal limits. Left Ventricle: The ventricular cavity size is within normal limits. Pericardium: Normal thickness without significant effusion or calcium present. Pulmonary Artery: Dilated. 3.9 cm. Consistent with pulmonary  hypertension. Cardiac valves: The aortic valve is trileaflet without significant calcification. The mitral valve is a normal structure without significant calcification. Aorta: Normal caliber with no significant disease. Mechanical aortic valve. Mitral annular calcification. Extra-cardiac findings: See attached radiology report for non-cardiac structures. IMPRESSION: 1. There is normal pulmonary vein drainage into the left atrium with ostial measurements above. 2. There is no thrombus in the left atrial appendage. 3. The esophagus runs in the left atrial midline and is not in proximity to any of the pulmonary vein ostia. 4. No PFO/ASD. 5. Normal coronary origin. Right dominance. 6. CAC score of 588 which is 66th percentile for age-, race-, and sex-matched controls. 7.  Dilated pulmonary artery consistent with pulmonary hypertension. Chilton Si, MD Electronically Signed: By: Chilton Si M.D. On: 03/12/2023 16:15   CT HEMATURIA WORKUP  Result Date: 03/12/2023 CLINICAL DATA:  Hematuria. EXAM: CT ABDOMEN AND PELVIS WITHOUT AND WITH CONTRAST TECHNIQUE: Multidetector CT imaging of the abdomen and pelvis was performed following the standard protocol before and following the bolus administration of intravenous contrast. RADIATION DOSE REDUCTION: This exam was performed according to the departmental dose-optimization program which includes automated exposure control, adjustment of the mA and/or kV according to patient size and/or use of iterative reconstruction technique. CONTRAST:  OMNIPAQUE IOHEXOL 350 MG/ML SOLN COMPARISON:  Noncontrast abdominopelvic CT 03/03/2023. CT the abdomen and pelvis with contrast 04/14/2020. FINDINGS: Lower chest: Interval development of left lower lobe airspace disease with associated air bronchograms, suspicious for pneumonia. Trace left pleural effusion. Clear right lung base. Aortic and coronary artery atherosclerosis status post median sternotomy and aortic valve  replacement. Hepatobiliary: The liver is normal in density without suspicious focal abnormality. Calcified gallstone without residual significant gallbladder distension, wall thickening or surrounding inflammation. No biliary ductal dilatation. Pancreas: Unremarkable. No pancreatic ductal dilatation or surrounding inflammatory changes. Spleen: Normal in size without focal abnormality. Adrenals/Urinary Tract: Both adrenal glands appear normal. Pre-contrast images demonstrate no renal, ureteral or bladder calculi. Post-contrast, both kidneys enhance normally. There is no evidence of enhancing renal mass. Simple renal cysts bilaterally, measuring up to 2.6 cm on the right and up to 2.7 cm on the left. No specific follow-up imaging recommended. Delayed images result in segmental visualization of the ureters. No focal upper tract urothelial  abnormalities are identified. Possible mild dependent debris in the bladder lumen without evidence of focal mucosal lesion or bladder wall thickening. Stomach/Bowel: No enteric contrast administered. New linear intraluminal metallic foreign body at the gastroesophageal junction, measuring approximately 1.7 cm in length, presumably a hemostatic clip placed at endoscopy 03/05/2023. The stomach otherwise appears unremarkable for its degree of distention. No residual small bowel distension, wall thickening or surrounding inflammation. The appendix appears normal. Distal small bowel extends into a right inguinal hernia. No evidence of incarceration. Mildly prominent stool in the colon. Vascular/Lymphatic: There are no enlarged abdominal or pelvic lymph nodes. Aortic and branch vessel atherosclerosis without evidence of aneurysm or large vessel occlusion. Left-sided varicocele noted. Reproductive: The prostate gland and seminal vesicles appear unremarkable. Other: As above, stable right inguinal hernia containing small bowel. Previous right inguinal herniorrhaphy. No ascites or  pneumoperitoneum. Musculoskeletal: No acute or significant osseous findings. Stable relatively mild multilevel spondylosis. Unless specific follow-up recommendations are mentioned in the findings or impression sections, no imaging follow-up of any mentioned incidental findings is recommended. IMPRESSION: 1. No acute findings or explanation for the patient's symptoms. No evidence of urinary tract calculus, renal mass or focal urothelial lesion. 2. Possible mild dependent debris in the bladder lumen. Cystoscopy may be warranted if the patient has persistent unexplained hematuria. 3. Interval development of left lower lobe airspace disease with associated air bronchograms, suspicious for pneumonia. Trace left pleural effusion. Recommend radiographic follow-up. 4. Stable right inguinal hernia containing small bowel without evidence of incarceration or obstruction. Previously demonstrated small bowel distension has resolved. 5. Cholelithiasis without evidence of acute cholecystitis. 6.  Aortic Atherosclerosis (ICD10-I70.0). Electronically Signed   By: Carey Bullocks M.D.   On: 03/12/2023 16:32   ECHOCARDIOGRAM COMPLETE  Result Date: 03/07/2023    ECHOCARDIOGRAM REPORT   Patient Name:   JONERIK CULL [facility] Date of Exam: 03/07/2023 Medical Rec #:  [id number]          Height:       70.0 in Accession #:    [id number]         Weight:       174.8 lb Date of Birth:  July 05, 1945          BSA:          1.971 m Patient Age:    77 years           BP:           114/58 mmHg Patient Gender: M                  HR:           92 bpm. Exam Location:  Inpatient Procedure: 2D Echo, Cardiac Doppler and Color Doppler Indications:    CHF- Acute Diastolic I50.31  History:        Patient has no prior history of Echocardiogram examinations.                 CKD, Stage 2; Arrythmias:Atrial Fibrillation. Bjork-Shiley                 tilting disc valve in 1983.                 Aortic Valve: Bjork-Shiley ball and cage valve is present in the                  aortic position.  Sonographer:    Lucendia Herrlich RCS Referring Phys: [id number] RAVI PAHWANI IMPRESSIONS  1. Left ventricular ejection  fraction, by estimation, is 40 to 45%. The left ventricle has mildly decreased function. The left ventricle demonstrates global hypokinesis. There is moderate left ventricular hypertrophy of the basal-septal segment. Left ventricular diastolic function could not be evaluated.  2. Right ventricular systolic function is moderately reduced. The right ventricular size is mildly enlarged. There is moderately elevated pulmonary artery systolic pressure. The estimated right ventricular systolic pressure is 55.6 mmHg.  3. Left atrial size was severely dilated.  4. Right atrial size was severely dilated.  5. The mitral valve is degenerative. Mild to moderate mitral valve regurgitation. 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 The mean mitral valve gradient is 8.2 mmHg. Moderate to severe mitral annular calcification.  6. The aortic valve has been repaired/replaced. Aortic valve regurgitation is not visualized. There is a Bjork-Shiley ball and cage valve present in the aortic position. Aortic valve mean gradient measures 9.0 mmHg. Aortic valve Vmax measures 2.00 m/s.  7. Aortic dilatation noted. There is mild dilatation of the ascending aorta, measuring 39 mm.  8. The inferior vena cava is dilated in size with >50% respiratory variability, suggesting right atrial pressure of 8 mmHg. FINDINGS  Left Ventricle: Left ventricular ejection fraction, by estimation, is 40 to 45%. The left ventricle has mildly decreased function. The left ventricle demonstrates global hypokinesis. The left ventricular internal cavity size was normal in size. There is  moderate left ventricular hypertrophy of the basal-septal segment. Left ventricular diastolic function could not be evaluated due to atrial fibrillation. Left ventricular diastolic function could not be evaluated. Right Ventricle: The right ventricular  size is mildly enlarged. No increase in right ventricular wall thickness. Right ventricular systolic function is moderately reduced. There is moderately elevated pulmonary artery systolic pressure. The tricuspid regurgitant velocity is 3.45 m/s, and with an assumed right atrial pressure of 8 mmHg, the estimated right ventricular systolic pressure is 55.6 mmHg. Left Atrium: Left atrial size was severely dilated. Right Atrium: Right atrial size was severely dilated. Pericardium: There is no evidence of pericardial effusion. Mitral Valve: The mitral valve is degenerative in appearance. There is moderate thickening of the mitral valve leaflet(s). There is moderate calcification of the mitral valve leaflet(s). Moderate to severe mitral annular calcification. Mild to moderate mitral valve regurgitation. 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 MV peak gradient, 16.9 mmHg. The mean mitral valve gradient is 8.2 mmHg. Tricuspid Valve: The tricuspid valve is normal in structure. Tricuspid valve regurgitation is mild . No evidence of tricuspid stenosis. Aortic Valve: The aortic valve has been repaired/replaced. Aortic valve regurgitation is not visualized. Aortic valve mean gradient measures 9.0 mmHg. Aortic valve peak gradient measures 16.0 mmHg. Aortic valve area, by VTI measures 1.42 cm. There is a Bjork-Shiley ball and cage valve present in the aortic position. Pulmonic Valve: The pulmonic valve was normal in structure. Pulmonic valve regurgitation is trivial. No evidence of pulmonic stenosis. Aorta: Aortic dilatation noted. There is mild dilatation of the ascending aorta, measuring 39 mm. Venous: The inferior vena cava is dilated in size with greater than 50% respiratory variability, suggesting right atrial pressure of 8 mmHg. IAS/Shunts: No atrial level shunt detected by color flow Doppler.  LEFT VENTRICLE PLAX 2D LVIDd:         5.60 cm      Diastology LVIDs:         4.30 cm      LV e' medial:    6.57 cm/s LV PW:          1.10 cm  LV E/e' medial:  28.9 LV IVS:        1.30 cm      LV e' lateral:   9.57 cm/s LVOT diam:     2.00 cm      LV E/e' lateral: 19.9 LV SV:         38 LV SV Index:   19 LVOT Area:     3.14 cm  LV Volumes (MOD) LV vol d, MOD A2C: 122.0 ml LV vol d, MOD A4C: 138.0 ml LV vol s, MOD A2C: 72.4 ml LV vol s, MOD A4C: 86.6 ml LV SV MOD A2C:     49.6 ml LV SV MOD A4C:     138.0 ml LV SV MOD BP:      48.4 ml RIGHT VENTRICLE            IVC RV S prime:     7.09 cm/s  IVC diam: 2.10 cm TAPSE (M-mode): 1.3 cm LEFT ATRIUM              Index        RIGHT ATRIUM           Index LA diam:        5.20 cm  2.64 cm/m   RA Area:     27.20 cm LA Vol (A2C):   120.0 ml 60.87 ml/m  RA Volume:   84.60 ml  42.92 ml/m LA Vol (A4C):   106.0 ml 53.77 ml/m LA Biplane Vol: 121.0 ml 61.38 ml/m  AORTIC VALVE AV Area (Vmax):    1.23 cm AV Area (Vmean):   1.19 cm AV Area (VTI):     1.42 cm AV Vmax:           200.00 cm/s AV Vmean:          135.333 cm/s AV VTI:            0.265 m AV Peak Grad:      16.0 mmHg AV Mean Grad:      9.0 mmHg LVOT Vmax:         78.10 cm/s LVOT Vmean:        51.267 cm/s LVOT VTI:          0.120 m LVOT/AV VTI ratio: 0.45  AORTA Ao Root diam: 3.50 cm Ao Asc diam:  3.90 cm MITRAL VALVE                  TRICUSPID VALVE MV Area (PHT): 3.74 cm       TR Peak grad:   47.6 mmHg MV Area VTI:   1.08 cm       TR Vmax:        345.00 cm/s MV Peak grad:  16.9 mmHg MV Mean grad:  8.2 mmHg       SHUNTS MV Vmax:       2.05 m/s       Systemic VTI:  0.12 m MV Vmean:      136.5 cm/s     Systemic Diam: 2.00 cm MV Decel Time: 203 msec MR Peak grad:    92.5 mmHg MR Mean grad:    74.0 mmHg MR Vmax:         481.00 cm/s MR Vmean:        408.0 cm/s MR PISA:         1.01 cm MR PISA Eff ROA: 8 mm MR PISA Radius:  0.40 cm MV E velocity: 190.00 cm/s MV A velocity: 104.00 cm/s  MV E/A ratio:  1.83 Armanda Magic MD Electronically signed by Armanda Magic MD Signature Date/Time: 03/07/2023/3:16:08 PM    Final    DG CHEST PORT 1 VIEW  Result  Date: 03/07/2023 CLINICAL DATA:  77 year old male with hypoxia. EXAM: PORTABLE CHEST 1 VIEW COMPARISON:  Portable chest 03/03/2023 and earlier. FINDINGS: Portable AP semi upright view at 0851 hours. Naso enteric tube removed. Stable lung volumes and mediastinal contours but new patchy and indistinct perihilar and interstitial opacity. Evidence of a small left pleural effusion. No pneumothorax. No air bronchograms. No right pleural effusion. Negative visible bowel gas. IMPRESSION: 1. Nasoenteric tube removed. 2. New perihilar and interstitial opacity with evidence of a small left pleural effusion. Favor acute pulmonary edema. Atypical/viral respiratory infection felt less likely. Electronically Signed   By: Odessa Fleming M.D.   On: 03/07/2023 09:42   DG Abd Portable 1V-Small Bowel Obstruction Protocol-initial, 8 hr delay  Result Date: 03/04/2023 CLINICAL DATA:  Follow-up SBO, 8 hour delayed film EXAM: PORTABLE ABDOMEN - 1 VIEW COMPARISON:  03/03/2023 FINDINGS: Enteric tube terminates in the proximal gastric body. No findings suspicious for small bowel obstruction. Contrast opacifies the colon at least to the splenic flexure and likely to the rectum. Prior bilateral inguinal hernia mesh repair. Vascular calcifications. IMPRESSION: Enteric tube terminates in the proximal gastric body. No findings suspicious for small bowel obstruction. Contrast to the left colon, as above. Electronically Signed   By: Charline Bills M.D.   On: 03/04/2023 11:56   DG CHEST PORT 1 VIEW  Result Date: 03/03/2023 CLINICAL DATA:  NG tube placement EXAM: PORTABLE CHEST 1 VIEW COMPARISON:  03/03/2023 FINDINGS: NG tube is in the stomach. Prior median sternotomy and valve replacement. Heart and mediastinal contours within normal limits. No confluent airspace opacities or effusions. Minimal bibasilar opacities, favor atelectasis. No acute bony abnormality. IMPRESSION: NG tube in the stomach. Bibasilar atelectasis. Electronically Signed    By: Charlett Nose M.D.   On: 03/03/2023 21:37   DG Chest Port 1 View  Result Date: 03/03/2023 CLINICAL DATA:  Nausea and vomiting. EXAM: PORTABLE CHEST 1 VIEW COMPARISON:  03/16/2021. FINDINGS: Cardiac silhouette appears prominent. No pneumonia or pulmonary edema. No pneumothorax or pleural effusion. NG tube tip is at the epigastric region and side port is overlying distal esophagus. The tube should be advanced about 7 cm. IMPRESSION: Enlarged cardiac silhouette. Lungs are clear. NG tube should be advanced. Electronically Signed   By: Layla Maw M.D.   On: 03/03/2023 18:24   DG Abd Portable 1 View  Result Date: 03/03/2023 CLINICAL DATA:  Status post NG tube placement. EXAM: PORTABLE ABDOMEN - 1 VIEW COMPARISON:  CT abdomen and pelvis 03/03/2023 FINDINGS: An enteric tube has been placed with its tip overlying the distal esophagus near the GE junction. Gastric and small bowel distension were more fully evaluated on today's CT. A calcified gallstone and aortic valve replacement are noted. IMPRESSION: Enteric tube tip near the GE junction. Recommend advancement and obtaining a repeat radiograph to ensure intragastric placement. Electronically Signed   By: Sebastian Ache M.D.   On: 03/03/2023 12:42   CT ABDOMEN PELVIS WO CONTRAST  Result Date: 03/03/2023 CLINICAL DATA:  Abdominal pain, acute, nonlocalized EXAM: CT ABDOMEN AND PELVIS WITHOUT CONTRAST TECHNIQUE: Multidetector CT imaging of the abdomen and pelvis was performed following the standard protocol without IV contrast. RADIATION DOSE REDUCTION: This exam was performed according to the departmental dose-optimization program which includes automated exposure control, adjustment of the mA and/or kV according to  patient size and/or use of iterative reconstruction technique. COMPARISON:  04/14/2020 FINDINGS: Lower chest: Lung bases are clear.  Coronary artery atherosclerosis. Hepatobiliary: No focal liver abnormality identified on noncontrast images.  Markedly dilated gallbladder. 1.7 cm stone in the region of the gallbladder neck. No pericholecystic inflammatory changes by CT. No intrahepatic biliary dilatation. Pancreas: Unremarkable. No pancreatic ductal dilatation or surrounding inflammatory changes. Spleen: Normal in size without focal abnormality. Adrenals/Urinary Tract: Unremarkable adrenal glands. Bilateral renal cysts, which require no follow-up imaging. Kidneys otherwise unremarkable. No stone or hydronephrosis. Urinary bladder within normal limits. Stomach/Bowel: Fluid within the distal esophagus. Moderately dilated stomach. Multiple fluid-filled, dilated loops of small bowel throughout the abdomen with abrupt transition to decompressed bowel anteriorly within the central abdomen (series 2, image 54). There is fecalization of small bowel content immediately upstream to the transition site. Distal small bowel is decompressed. A small loop of distal small bowel extends into patient's right inguinal hernia. Normal appendix in the right lower quadrant. Colonic diverticulosis. No colonic wall thickening or inflammatory changes. Vascular/Lymphatic: Extensive aortoiliac atherosclerosis. No lymphadenopathy. Reproductive: Prostate gland within normal limits. Other: No ascites or pneumoperitoneum. Right inguinal hernia containing fat and distal small bowel. Musculoskeletal: No acute or significant osseous findings. IMPRESSION: 1. Small-bowel obstruction with abrupt transition to decompressed bowel anteriorly within the central abdomen, possibly secondary to adhesions. 2. Markedly dilated gallbladder with 1.7 cm stone in the region of the gallbladder neck. No pericholecystic inflammatory changes by CT. Correlate clinically for acute cholecystitis. 3. Right inguinal hernia containing fat and distal small bowel. 4. Colonic diverticulosis without evidence of acute diverticulitis. 5. Aortic atherosclerosis (ICD10-I70.0). These results were called by telephone at the  time of interpretation on 03/03/2023 at 10:26 am to provider Lorre Nick , who verbally acknowledged these results. Electronically Signed   By: Duanne Guess D.O.   On: 03/03/2023 10:27    Microbiology: Results for orders placed or performed during the hospital encounter of 03/03/23  MRSA Next Gen by PCR, Nasal     Status: None   Collection Time: 03/04/23  2:30 AM   Specimen: Nasal Mucosa; Nasal Swab  Result Value Ref Range Status   MRSA by PCR Next Gen NOT DETECTED NOT DETECTED Final    Comment: (NOTE) The GeneXpert MRSA Assay (FDA approved for NASAL specimens only), is one component of a comprehensive MRSA colonization surveillance program. It is not intended to diagnose MRSA infection nor to guide or monitor treatment for MRSA infections. Test performance is not FDA approved in patients less than 49 years old. Performed at [facility], [address] [place], [place] [address]   Culture, blood (Routine X 2) w Reflex to ID Panel     Status: None   Collection Time: 03/06/23 10:52 AM   Specimen: BLOOD LEFT ARM  Result Value Ref Range Status   Specimen Description   Final    BLOOD LEFT ARM Performed at [facility], [address] [place], [place] [address]    Special Requests   Final    BOTTLES DRAWN AEROBIC AND ANAEROBIC Blood Culture adequate volume Performed at [facility], [address], [place], [place] [address]    Culture   Final    NO GROWTH 5 DAYS Performed at [facility], [address], [place], [place] [address]    Report Status 03/11/2023 FINAL  Final  Culture, blood (Routine X 2) w Reflex to ID Panel     Status: None  Collection Time: 03/06/23 11:08 AM   Specimen: BLOOD  Result Value Ref Range Status   Specimen Description   Final    BLOOD SITE NOT SPECIFIED Performed at [facility], [address] [place], [place] [address]    Special Requests   Final    BOTTLES DRAWN AEROBIC  ONLY Blood Culture results may not be optimal due to an inadequate volume of blood received in culture bottles Performed at [facility], [address], [place], [place] [address]    Culture   Final    NO GROWTH 5 DAYS Performed at [facility], [address], [place], [place] [address]    Report Status 03/11/2023 FINAL  Final    Labs: CBC: Recent Labs  Lab 03/13/23 0541 03/14/23 0015 03/14/23 0311 03/15/23 0435 03/16/23 0502 03/17/23 0420  WBC 8.5  --  9.4 8.2 7.4 6.5  NEUTROABS  --   --  7.1  --   --   --   HGB 7.7* 8.8* 9.1* 9.1* 9.6* 9.0*  HCT 24.8* 27.7* 27.1* 28.8* 29.8* 27.4*  MCV 98.8  --  93.8 97.3 95.2 94.8  PLT 211  --  258 221 244 226   Basic Metabolic Panel: Recent Labs  Lab 03/13/23 1638 03/14/23 0311 03/15/23 0928 03/16/23 0502 03/17/23 0811  NA 134* 135  --  133* 129*  K 3.7 3.9  --  3.6 3.6  CL 98 100  --  99 98  CO2 24 24  --  24 23  GLUCOSE 134* 96  --  88 99  BUN 16 18  --  16 15  CREATININE 1.29* 1.29*  --  1.39* 1.31*  CALCIUM 8.9 8.7*  --  9.0 8.6*  MG 2.3 2.4  --   --   --   PHOS  --   --  3.5 3.4  --    Liver Function Tests: No results for input(s): "AST", "ALT", "ALKPHOS", "BILITOT", "PROT", "ALBUMIN" in the last 168 hours. CBG: No results for input(s): "GLUCAP" in the last 168 hours.  Discharge time spent: 40 minutes   Signed: Kathlen Mody, MD [facility]

## 2023-03-22 ENCOUNTER — Ambulatory Visit: Payer: Medicare Other | Attending: Physician Assistant | Admitting: Physician Assistant

## 2023-03-22 VITALS — BP 130/50 | HR 55 | Ht 70.0 in | Wt 168.0 lb

## 2023-03-22 DIAGNOSIS — T462X5D Adverse effect of other antidysrhythmic drugs, subsequent encounter: Secondary | ICD-10-CM | POA: Diagnosis not present

## 2023-03-22 DIAGNOSIS — T462X5A Adverse effect of other antidysrhythmic drugs, initial encounter: Secondary | ICD-10-CM

## 2023-03-22 DIAGNOSIS — Z952 Presence of prosthetic heart valve: Secondary | ICD-10-CM | POA: Diagnosis not present

## 2023-03-22 DIAGNOSIS — I4819 Other persistent atrial fibrillation: Secondary | ICD-10-CM | POA: Diagnosis not present

## 2023-03-22 DIAGNOSIS — I4589 Other specified conduction disorders: Secondary | ICD-10-CM

## 2023-03-22 DIAGNOSIS — Z79899 Other long term (current) drug therapy: Secondary | ICD-10-CM | POA: Diagnosis not present

## 2023-03-22 DIAGNOSIS — Z7901 Long term (current) use of anticoagulants: Secondary | ICD-10-CM | POA: Diagnosis not present

## 2023-03-22 DIAGNOSIS — I050 Rheumatic mitral stenosis: Secondary | ICD-10-CM | POA: Diagnosis not present

## 2023-03-22 NOTE — Patient Instructions (Addendum)
Medication Instructions:  NO CHANGES *If you need a refill on your cardiac medications before your next appointment, please call your pharmacy*   Lab Work: CBC, CMP AND TSH IN 1 WEEK If you have labs (blood work) drawn today and your tests are completely normal, you will receive your results only by: MyChart Message (if you have MyChart) OR A paper copy in the mail If you have any lab test that is abnormal or we need to change your treatment, we will call you to review the results.   Testing/Procedures:[address] A chest x-ray takes a picture of the organs and structures inside the chest, including the heart, lungs, and blood vessels. This test can show several things, including, whether the heart is enlarges; whether fluid is building up in the lungs; and whether pacemaker / defibrillator leads are still in place.   ZIO XT- Long Term Monitor Instructions  Your physician has requested you wear a ZIO patch monitor for 3 days.  This is a single patch monitor. [employer] supplies one patch monitor per enrollment. Additional stickers are not available. Please do not apply patch if you will be having a Nuclear Stress Test,  Echocardiogram, Cardiac CT, MRI, or Chest Xray during the period you would be wearing the  monitor. The patch cannot be worn during these tests. You cannot remove and re-apply the  ZIO XT patch monitor.  Your ZIO patch monitor will be mailed 3 day USPS to your address on file. It may take 3-5 days  to receive your monitor after you have been enrolled.  Once you have received your monitor, please review the enclosed instructions. Your monitor  has already been registered assigning a specific monitor serial # to you.  Billing and Patient Assistance Program Information  We have supplied [employer] with any of your insurance information on file for billing purposes. [employer] offers a sliding scale Patient Assistance Program for patients that do not have  insurance, or  whose insurance does not completely cover the cost of the ZIO monitor.  You must apply for the Patient Assistance Program to qualify for this discounted rate.  To apply, please call [employer] at [phone], select option 4, select option 2, ask to apply for  Patient Assistance Program. Meredeth Ide will ask your household income, and how many people  are in your household. They will quote your out-of-pocket cost based on that information.  [employer] will also be able to set up a 62-month, interest-free payment plan if needed.  Applying the monitor   Shave hair from upper left chest.  Hold abrader disc by orange tab. Rub abrader in 40 strokes over the upper left chest as  indicated in your monitor instructions.  Clean area with 4 enclosed alcohol pads. Let dry.  Apply patch as indicated in monitor instructions. Patch will be placed under collarbone on left  side of chest with arrow pointing upward.  Rub patch adhesive wings for 2 minutes. Remove white label marked "1". Remove the white  label marked "2". Rub patch adhesive wings for 2 additional minutes.  While looking in a mirror, press and release button in center of patch. A small green light will  flash 3-4 times. This will be your only indicator that the monitor has been turned on.  Do not shower for the first 24 hours. You may shower after the first 24 hours.  Press the button if you feel a symptom. You will hear a small click. Record Date, Time and  Symptom in the Patient Logbook.  When you are ready to remove the patch, follow instructions on the last 2 pages of Patient  Logbook. Stick patch monitor onto the last page of Patient Logbook.  Place Patient Logbook in the blue and white box. Use locking tab on box and tape box closed  securely. The blue and white box has prepaid postage on it. Please place it in the mailbox as  soon as possible. Your physician should have your test results approximately 7 days after the  monitor has been mailed  back to [facility].  Call [facility] Customer Care at [phone] if you have questions regarding  your ZIO XT patch monitor. Call them immediately if you see an orange light blinking on your  monitor.  If your monitor falls off in less than 4 days, contact our Monitor department at [phone].  If your monitor becomes loose or falls off after 4 days call [employer] at [phone] for  suggestions on securing your monitor    Follow-Up: At [facility], you and your health needs are our priority.  As part of our continuing mission to provide you with exceptional heart care, we have created designated Provider Care Teams.  These Care Teams include your primary Cardiologist (physician) and Advanced Practice Providers (APPs -  Physician Assistants and Nurse Practitioners) who all work together to provide you with the care you need, when you need it.   Your next appointment:   2 month(s)  Provider:   Azalee Course, PA-C    Then, Olga Millers, MD will plan to see you again in 4-5 month(s).

## 2023-03-22 NOTE — Progress Notes (Unsigned)
Cardiology Office Note:  .   Date:  03/24/2023  ID:  Alan Willis, DOB 08/17/1945, MRN [id number] PCP: [name], MD  [facility] Providers Cardiologist:  Olga Millers, MD     History of Present Illness: .   Alan Willis is a 77 y.o. 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  Patient was recently admitted in November 2020 for was abdominal pain, abdominal distention, multiple episode of coffee-ground emesis, diarrhea with melena.  On arrival, he had leukocytosis with white blood cell count of 21.7.  Hemoglobin 16.2.  Platelet 181.  Fecal occult blood was positive.  Troponin mildly elevated.  Lactic acid 2.2.  CT of abdomen and pelvis showed a small bowel obstruction as well as dilated gallbladder.  On admission, patient had wide-complex QRS with hypotension.  This was felt to be atrial flutter were reviewed by cardiology service.  Heart rate improved with metoprolol.  He was placed back on dofetilide and warfarin.  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  Biopsy of duodenal bulb showed severe mucosal changes with erythema, he inflammation and nodularity.  He was placed on PPI indefinitely.  GI was okay with restarting his anticoagulation due to risk of stroke.  Echocardiogram obtained on 03/07/2023 showed EF 40 to 45%, RVSP 55.6 mmHg, moderate LVH of basal septal segment, severe biatrial enlargement, mild to moderate MR, mod to severe MS, mechanical aortic valve.  Patient was initially placed on the board for TEE DCCV, however given recent Mallory-Weiss tear, ultimately we try to avoid TEE by using CTA to rule out left atrial appendage thrombus.  Patient ultimately underwent cardioversion on 03/13/2023 with restoration  of sinus rhythm.  Unfortunately, he quickly reverted back to atrial flutter after the cardioversion.  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  The case was discussed with the EP service.  He was started on amiodarone therapy with instruction of taking 400 mg twice daily for 7 days then 200 mg daily thereafter.  The drop in the ejection fraction was felt to be rate related.  Patient presents today, accompanied by wife.  He has been feeling well denies any chest pain or shortness of breath since he left the hospital.  I recommended a CMP, TSH and a two-view chest x-ray next week due to possibility of amiodarone toxicity.  After this weekend, they will reduce the dose of amiodarone down to 200 mg daily.  I will also obtain a CBC next week as well given the recent anemia.  Patient is maintaining sinus rhythm based on physical examination, we will confirm using's EKG.  Overall, he has been doing well and can follow-up in 2 months with me and 4 to 5 months with Dr. Jens Som.  His EKG is shows some degree of A-V dissociation.  This was reviewed by Dr. Antoine Poche as well.  Heart rate is in the 50s.  He has no symptoms.  We will running a 3-day heart monitor.  ROS:   He denies chest pain, palpitations, dyspnea, pnd, orthopnea, n, v, dizziness, syncope, edema, weight gain, or early satiety. All other systems reviewed and are otherwise negative except as noted above.    Studies Reviewed: Marland Kitchen   EKG Interpretation Date/Time:  Friday March 22 2023 14:53:42 EST Ventricular Rate:  51 PR Interval:    QRS Duration:  142 QT Interval:  534 QTC Calculation: 492 R Axis:   -25  Text Interpretation: Sinus rhythm Decreasing PR interval. No significant ST-T wave changes Confirmed by Azalee Course [phone]) on 03/22/2023 3:19:30 PM     Risk Assessment/Calculations:    CHA2DS2-VASc Score = 5   This indicates a 7.2% annual risk of stroke. The patient's score is based upon: CHF History: 1 HTN History: 0 Diabetes History:  0 Stroke History: 2 Vascular Disease History: 0 Age Score: 2 Gender Score: 0           Physical Exam:   VS:  BP (!) 130/50 (BP Location: Left Arm, Patient Position: Sitting, Cuff Size: Normal)   Pulse (!) 55   Ht 5\' 10"  (1.778 m)   Wt 168 lb (76.2 kg)   SpO2 99%   BMI 24.11 kg/m    Wt Readings from Last 3 Encounters:  03/22/23 168 lb (76.2 kg)  03/03/23 174 lb 13.2 oz (79.3 kg)  12/19/22 176 lb 12.8 oz (80.2 kg)    GEN: Well nourished, well developed in no acute distress NECK: No JVD; No carotid bruits CARDIAC: RRR, no murmurs, rubs, gallops RESPIRATORY:  Clear to auscultation without rales, wheezing or rhonchi  ABDOMEN: Soft, non-tender, non-distended EXTREMITIES:  No edema; No deformity   ASSESSMENT AND PLAN: .     Atrial Fibrillation/Flutter Recent hospitalization with atrial flutter. Failed cardioversion and Tikosyn. Switched to Amiodarone with a loading dose of 400mg  BID for 7 days, then 200mg  daily thereafter. No cardiac awareness of rhythm changes. -Continue Amiodarone as prescribed. -EKG does show P wave, however appears to have some degree of A-V dissociation.  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 -Order CMP, TSH, and 2-view chest x-ray next week. -Annual eye exam recommended.  Anemia Recent anemia with hemoglobin at 9.0 at hospital discharge. -Order CBC next week to monitor hemoglobin levels.  A-V dissociation: EKG showed P wave, however appears to have VT of association.  This is reviewed by Dr. Antoine Poche who recommended a 3-day heart monitor.  Mitral Valve Stenosis Moderate to severe stenosis noted on recent echocardiogram. -Repeat echocardiogram in 6-12 months to monitor mitral valve.  Mechanical arctic valve: Coumadin managed by PCP       Dispo: Follow-up in 2 month  Signed, Azalee Course, [place]

## 2023-03-28 DIAGNOSIS — T462X5A Adverse effect of other antidysrhythmic drugs, initial encounter: Secondary | ICD-10-CM | POA: Diagnosis not present

## 2023-03-28 DIAGNOSIS — I4819 Other persistent atrial fibrillation: Secondary | ICD-10-CM | POA: Diagnosis not present

## 2023-03-29 DIAGNOSIS — Z7901 Long term (current) use of anticoagulants: Secondary | ICD-10-CM | POA: Diagnosis not present

## 2023-03-29 LAB — CBC
Hematocrit: 32.1 % — ABNORMAL LOW (ref 37.5–51.0)
Hemoglobin: 10.1 g/dL — ABNORMAL LOW (ref 13.0–17.7)
MCH: 29.7 pg (ref 26.6–33.0)
MCHC: 31.5 g/dL (ref 31.5–35.7)
MCV: 94 fL (ref 79–97)
Platelets: 178 10*3/uL (ref 150–450)
RBC: 3.4 x10E6/uL — ABNORMAL LOW (ref 4.14–5.80)
RDW: 13.2 % (ref 11.6–15.4)
WBC: 6.5 10*3/uL (ref 3.4–10.8)

## 2023-03-29 LAB — COMPREHENSIVE METABOLIC PANEL
ALT: 9 [IU]/L (ref 0–44)
AST: 17 [IU]/L (ref 0–40)
Albumin: 4.4 g/dL (ref 3.8–4.8)
Alkaline Phosphatase: 75 [IU]/L (ref 44–121)
BUN/Creatinine Ratio: 7 — ABNORMAL LOW (ref 10–24)
BUN: 10 mg/dL (ref 8–27)
Bilirubin Total: 0.6 mg/dL (ref 0.0–1.2)
CO2: 25 mmol/L (ref 20–29)
Calcium: 8.9 mg/dL (ref 8.6–10.2)
Chloride: 97 mmol/L (ref 96–106)
Creatinine, Ser: 1.42 mg/dL — ABNORMAL HIGH (ref 0.76–1.27)
Globulin, Total: 2.4 g/dL (ref 1.5–4.5)
Glucose: 90 mg/dL (ref 70–99)
Potassium: 3.5 mmol/L (ref 3.5–5.2)
Sodium: 136 mmol/L (ref 134–144)
Total Protein: 6.8 g/dL (ref 6.0–8.5)
eGFR: 51 mL/min/{1.73_m2} — ABNORMAL LOW (ref >59)

## 2023-03-29 LAB — TSH: TSH: 6.81 u[IU]/mL — ABNORMAL HIGH (ref 0.450–4.500)

## 2023-04-01 ENCOUNTER — Telehealth: Payer: Self-pay

## 2023-04-01 DIAGNOSIS — Z7901 Long term (current) use of anticoagulants: Secondary | ICD-10-CM | POA: Diagnosis not present

## 2023-04-01 NOTE — Telephone Encounter (Addendum)
Called patient regarding results spoke with patients wife [facility] [place] on [address], patient will cute current tablets iun half till bottle runs out, patient wife also wanted to know if patient is ok to travel by air patient has upcoming trip 04/27/23, informed patient I would ask provider and reach back out when I have an answer, patient wife verbalized understanding    ----- Message from Azalee Course sent at 03/29/2023  2:39 PM EST ----- Stable red blood cell count. Renal function suggest patient is a little dry, let's reduce the lasix to 20mg  daily. Repeat BMET in 1 month. TSH high suggest true thyroid level low. 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 Please forward result to PCP to manage low thyroid.

## 2023-04-04 NOTE — Telephone Encounter (Signed)
As long as the upcoming heart monitor shows no significant conduction block, I think Alan Willis is ok to travel by air

## 2023-04-04 NOTE — Telephone Encounter (Signed)
Called patient regarding provider advise on traveling by air, patient verbalized understanding

## 2023-04-05 DIAGNOSIS — Z7901 Long term (current) use of anticoagulants: Secondary | ICD-10-CM | POA: Diagnosis not present

## 2023-04-11 DIAGNOSIS — Z8719 Personal history of other diseases of the digestive system: Secondary | ICD-10-CM | POA: Diagnosis not present

## 2023-04-11 DIAGNOSIS — Z952 Presence of prosthetic heart valve: Secondary | ICD-10-CM | POA: Diagnosis not present

## 2023-04-11 DIAGNOSIS — Z7901 Long term (current) use of anticoagulants: Secondary | ICD-10-CM | POA: Diagnosis not present

## 2023-04-11 DIAGNOSIS — R946 Abnormal results of thyroid function studies: Secondary | ICD-10-CM | POA: Diagnosis not present

## 2023-04-18 DIAGNOSIS — Z7901 Long term (current) use of anticoagulants: Secondary | ICD-10-CM | POA: Diagnosis not present

## 2023-04-25 ENCOUNTER — Other Ambulatory Visit: Payer: Self-pay | Admitting: Physician Assistant

## 2023-04-25 ENCOUNTER — Ambulatory Visit: Payer: Medicare Other | Attending: Physician Assistant

## 2023-04-25 ENCOUNTER — Telehealth: Payer: Self-pay | Admitting: Physician Assistant

## 2023-04-25 DIAGNOSIS — T462X5A Adverse effect of other antidysrhythmic drugs, initial encounter: Secondary | ICD-10-CM

## 2023-04-25 DIAGNOSIS — I4819 Other persistent atrial fibrillation: Secondary | ICD-10-CM

## 2023-04-25 DIAGNOSIS — I4589 Other specified conduction disorders: Secondary | ICD-10-CM

## 2023-04-25 DIAGNOSIS — Z79899 Other long term (current) drug therapy: Secondary | ICD-10-CM

## 2023-04-25 DIAGNOSIS — Z7901 Long term (current) use of anticoagulants: Secondary | ICD-10-CM | POA: Diagnosis not present

## 2023-04-25 DIAGNOSIS — I050 Rheumatic mitral stenosis: Secondary | ICD-10-CM

## 2023-04-25 DIAGNOSIS — Z952 Presence of prosthetic heart valve: Secondary | ICD-10-CM

## 2023-04-25 NOTE — Telephone Encounter (Signed)
 Pt spouse called in to clarify pt should be taking furosemide. She also stated that he was supposed to have a zio monitor shipped to them but has not received. Please advise.

## 2023-04-25 NOTE — Telephone Encounter (Signed)
 Investigated why patient had not received his ZIO monitor. The order was entered incorrectly which, linked it to the office visit with [name] on 03/22/23.  Consequently, the order was never forwarded to the monitor department work que. I will unlink the order today and process it.  Alan Willis should receive his device in 3-5 days. I apologize for the delay in processing this order and thank you for bringing it to our attention.

## 2023-04-25 NOTE — Progress Notes (Unsigned)
 Enrolled for [employer] to mail a ZIO XT long term holter monitor to the patients address on file.  See telephone note regarding delay of processing order.  Dr. Jens Som to read.

## 2023-04-25 NOTE — Telephone Encounter (Signed)
 Called and spoke to patient's wife. Verified name and DOB. She needed clarification on Lasix dosage. Inform her Lasix 40 mg  in the morning. Also told I was sending a message to the department that handles to Monitors for more information.

## 2023-05-02 DIAGNOSIS — I4819 Other persistent atrial fibrillation: Secondary | ICD-10-CM | POA: Diagnosis not present

## 2023-05-02 DIAGNOSIS — I4589 Other specified conduction disorders: Secondary | ICD-10-CM | POA: Diagnosis not present

## 2023-05-02 DIAGNOSIS — Z7901 Long term (current) use of anticoagulants: Secondary | ICD-10-CM | POA: Diagnosis not present

## 2023-05-03 ENCOUNTER — Other Ambulatory Visit: Payer: Self-pay

## 2023-05-03 DIAGNOSIS — Z79899 Other long term (current) drug therapy: Secondary | ICD-10-CM

## 2023-05-03 DIAGNOSIS — I4819 Other persistent atrial fibrillation: Secondary | ICD-10-CM

## 2023-05-03 DIAGNOSIS — T462X5A Adverse effect of other antidysrhythmic drugs, initial encounter: Secondary | ICD-10-CM | POA: Diagnosis not present

## 2023-05-03 LAB — CBC

## 2023-05-04 LAB — CBC
Hematocrit: 40.8 % (ref 37.5–51.0)
Hemoglobin: 12.7 g/dL — ABNORMAL LOW (ref 13.0–17.7)
MCH: 28.2 pg (ref 26.6–33.0)
MCHC: 31.1 g/dL — ABNORMAL LOW (ref 31.5–35.7)
MCV: 91 fL (ref 79–97)
Platelets: 159 10*3/uL (ref 150–450)
RBC: 4.51 x10E6/uL (ref 4.14–5.80)
RDW: 13.1 % (ref 11.6–15.4)
WBC: 6.4 10*3/uL (ref 3.4–10.8)

## 2023-05-04 LAB — COMPREHENSIVE METABOLIC PANEL
ALT: 15 [IU]/L (ref 0–44)
AST: 25 [IU]/L (ref 0–40)
Albumin: 4.3 g/dL (ref 3.8–4.8)
Alkaline Phosphatase: 86 [IU]/L (ref 44–121)
BUN/Creatinine Ratio: 9 — ABNORMAL LOW (ref 10–24)
BUN: 12 mg/dL (ref 8–27)
Bilirubin Total: 0.5 mg/dL (ref 0.0–1.2)
CO2: 24 mmol/L (ref 20–29)
Calcium: 9.3 mg/dL (ref 8.6–10.2)
Chloride: 100 mmol/L (ref 96–106)
Creatinine, Ser: 1.35 mg/dL — ABNORMAL HIGH (ref 0.76–1.27)
Globulin, Total: 2.6 g/dL (ref 1.5–4.5)
Glucose: 101 mg/dL — ABNORMAL HIGH (ref 70–99)
Potassium: 3.7 mmol/L (ref 3.5–5.2)
Sodium: 139 mmol/L (ref 134–144)
Total Protein: 6.9 g/dL (ref 6.0–8.5)
eGFR: 54 mL/min/{1.73_m2} — ABNORMAL LOW (ref >59)

## 2023-05-04 LAB — TSH: TSH: 3.58 u[IU]/mL (ref 0.450–4.500)

## 2023-05-08 ENCOUNTER — Telehealth: Payer: Self-pay

## 2023-05-08 NOTE — Telephone Encounter (Addendum)
Results viewed by patient via Mychart.----- Message from Azalee Course sent at 05/06/2023  1:55 PM EST ----- Red blood cell count improved (now back in normal range). Stable renal function and electrolyte. Normal liver enzyme.

## 2023-05-09 DIAGNOSIS — Z7901 Long term (current) use of anticoagulants: Secondary | ICD-10-CM | POA: Diagnosis not present

## 2023-05-13 ENCOUNTER — Ambulatory Visit
Admission: RE | Admit: 2023-05-13 | Discharge: 2023-05-13 | Disposition: A | Discharge status: Home / Self Care | Payer: Medicare Other | Source: Ambulatory Visit | Attending: Physician Assistant | Admitting: Physician Assistant

## 2023-05-13 DIAGNOSIS — Z79899 Other long term (current) drug therapy: Secondary | ICD-10-CM | POA: Diagnosis not present

## 2023-05-13 MED ORDER — AMIODARONE HCL 200 MG PO TABS: 200.0000 mg | ORAL_TABLET | Freq: Every day | ORAL | 3 refills | Status: DC

## 2023-05-13 MED ORDER — EMPAGLIFLOZIN 10 MG PO TABS: 10.0000 mg | ORAL_TABLET | Freq: Every day | ORAL | 3 refills | Status: AC

## 2023-05-13 MED ORDER — METOPROLOL SUCCINATE ER 25 MG PO TB24: 12.5000 mg | ORAL_TABLET | Freq: Every day | ORAL | 3 refills | Status: DC

## 2023-05-13 MED ORDER — PANTOPRAZOLE SODIUM 40 MG PO TBEC: 40.0000 mg | DELAYED_RELEASE_TABLET | Freq: Two times a day (BID) | ORAL | 1 refills | Status: DC

## 2023-05-15 DIAGNOSIS — I4819 Other persistent atrial fibrillation: Secondary | ICD-10-CM | POA: Diagnosis not present

## 2023-05-15 DIAGNOSIS — I4589 Other specified conduction disorders: Secondary | ICD-10-CM | POA: Diagnosis not present

## 2023-05-16 NOTE — Telephone Encounter (Signed)
Called and spoke to [facility]  Pharmacy states:   -Patient did not pick up prescription for Amiodarone 400mg , it was sent back to stock  Informed pharmacy:   -patient took an additional 18 tablets for 200mg  Amiodarone tablet to meet instructions of 400mg  amiodarone twice a day for 6 days   -patient may have not been aware or other prescriptions   -taking additional dose is what is causing early refill request  Per pharmacy:   -insurance unable to cover early fill despite instruction change   -patient can pick up 10 tablet of Amiodarone 200mg  day and pay $17.86 for cash discount   -patient can outreach pharmacy on 2/7 to request 90 prescription fill, that is earliest insurance will cover

## 2023-05-20 NOTE — Progress Notes (Signed)
Heart monitor showed recurrent atrial flutter, he stayed in aflutter during the entire recording. Will review further on the next follow up. Patient will need a rhythm strip during his appt with me on 2/11

## 2023-05-28 ENCOUNTER — Ambulatory Visit: Payer: Medicare Other | Attending: Physician Assistant | Admitting: Physician Assistant

## 2023-05-28 ENCOUNTER — Encounter: Payer: Self-pay | Admitting: Physician Assistant

## 2023-05-28 VITALS — BP 104/60 | HR 61 | Ht 70.0 in | Wt 167.0 lb

## 2023-05-28 DIAGNOSIS — Z952 Presence of prosthetic heart valve: Secondary | ICD-10-CM | POA: Diagnosis not present

## 2023-05-28 DIAGNOSIS — Z01810 Encounter for preprocedural cardiovascular examination: Secondary | ICD-10-CM

## 2023-05-28 DIAGNOSIS — I4819 Other persistent atrial fibrillation: Secondary | ICD-10-CM | POA: Diagnosis not present

## 2023-05-28 DIAGNOSIS — I5022 Chronic systolic (congestive) heart failure: Secondary | ICD-10-CM

## 2023-05-28 NOTE — Progress Notes (Unsigned)
Cardiology Office Note:  .   Date:  05/28/2023  ID:  Alan Willis, DOB Aug 04, 1945, MRN [id number] PCP: [name], MD  [facility] Providers Cardiologist:  Olga Millers, MD { Click to update primary MD,subspecialty MD or APP then REFRESH:1}   History of Present Illness: .   Alan Willis is a 78 y.o. 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  Patient was recently admitted in November 2024 was abdominal pain, abdominal distention, multiple episode of coffee-ground emesis, diarrhea with melena.  On arrival, he had leukocytosis with white blood cell count of 21.7.  Hemoglobin 16.2.  Platelet 181.  Fecal occult blood was positive.  Troponin mildly elevated.  Lactic acid 2.2.  CT of abdomen and pelvis showed a small bowel obstruction as well as dilated gallbladder.  On admission, patient had wide-complex QRS with hypotension.  This was felt to be atrial flutter after reviewed by cardiology service.  Heart rate improved with metoprolol.  He was placed back on dofetilide and warfarin.  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  Biopsy of duodenal bulb showed severe mucosal changes with erythema, inflammation and nodularity.  He was placed on PPI indefinitely.  GI was okay with restarting his anticoagulation due to risk of stroke.  Echocardiogram obtained on 03/07/2023 showed EF 40 to 45%, RVSP 55.6 mmHg, moderate LVH of basal septal segment, severe biatrial enlargement, mild to moderate MR, mod to severe MS, mechanical aortic valve.  Patient was initially placed on the board for TEE DCCV, however given recent Mallory-Weiss tear, ultimately we try to avoid TEE by using CTA to rule out left atrial appendage thrombus.  Patient ultimately  underwent cardioversion on 03/13/2023 with restoration of sinus rhythm.  Unfortunately, he quickly reverted back to atrial flutter after the cardioversion.  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  The case was discussed with the EP service.  He was started on amiodarone therapy with instruction of taking 400 mg twice daily for 7 days then 200 mg daily thereafter.  The drop in the ejection fraction was felt to be rate related.   I last saw the patient in December 2024, EKG at the time showed some degree of A-V dissociation, this was reviewed by Dr. Ninetta Lights as well, we decided to write a 3-day heart monitor.  Her monitor indicated he remained in atrial flutter during the entire recording.  Heart rate ranges from 36-86 with average heart rate of 66 bpm.  Patient presents today for follow-up along with wife.  He is still yet atrial flutter.  He has occasional fatigue, however this is not persistent.  He also has occasional dizzy spells as well.  His blood pressure is borderline at 104/60.  Otherwise he has no cardiac awareness of the atrial flutter.  I reviewed the fact that he has essentially in 100% atrial flutter based on the recent heart monitor.  We discussed the possibility of either attempting another cardioversion or leave him in atrial flutter.  Since he is not significantly symptomatic with atrial flutter, I think it would be reasonable to leave him in atrial flutter at this time.  He does still require amiodarone more as a rate control medication rather than antiarrhythmic therapy.  Otherwise he has no lower extremity edema, orthopnea or PND.    ROS: ***  Studies Reviewed: .        ***  Risk Assessment/Calculations:   {Does this patient have ATRIAL FIBRILLATION?:[phone]}         Physical Exam:   VS:  BP 104/60 (BP Location: Right Arm, Patient Position: Sitting, Cuff Size: Normal)   Pulse 61   Ht 5\' 10"  (1.778 m)   Wt 167 lb (75.8 kg)   SpO2 94%   BMI 23.96 kg/m    Wt Readings from Last 3  Encounters:  05/28/23 167 lb (75.8 kg)  03/22/23 168 lb (76.2 kg)  03/03/23 174 lb 13.2 oz (79.3 kg)    GEN: Well nourished, well developed in no acute distress NECK: No JVD; No carotid bruits CARDIAC: ***RRR, no murmurs, rubs, gallops RESPIRATORY:  Clear to auscultation without rales, wheezing or rhonchi  ABDOMEN: Soft, non-tender, non-distended EXTREMITIES:  No edema; No deformity   ASSESSMENT AND PLAN: .   ***    {Are you ordering a CV Procedure (e.g. stress test, cath, DCCV, TEE, etc)?   Press F2        :[id number]}  Dispo: ***  Signed, Azalee Course, PA

## 2023-05-28 NOTE — Patient Instructions (Signed)
Medication Instructions:  NO CHANGES *If you need a refill on your cardiac medications before your next appointment, please call your pharmacy*   Lab Work: NO LABS If you have labs (blood work) drawn today and your tests are completely normal, you will receive your results only by: MyChart Message (if you have MyChart) OR A paper copy in the mail If you have any lab test that is abnormal or we need to change your treatment, we will call you to review the results.   Testing/Procedures: NO TESTING   Follow-Up: At [facility], you and your health needs are our priority.  As part of our continuing mission to provide you with exceptional heart care, we have created designated Provider Care Teams.  These Care Teams include your primary Cardiologist (physician) and Advanced Practice Providers (APPs -  Physician Assistants and Nurse Practitioners) who all work together to provide you with the care you need, when you need it.  Your next appointment:   3-4 month(s)  Provider:   Olga Millers, MD

## 2023-05-29 DIAGNOSIS — Z7901 Long term (current) use of anticoagulants: Secondary | ICD-10-CM | POA: Diagnosis not present

## 2023-06-02 IMAGING — MR MR HEAD WO/W CM
16 of 20 series · 34 of 48 positions shown · IV contrast (multihance)
Comparison: CT 04/14/2020

CLINICAL DATA: Increased thirst. Headache. Confusion. Duration of
symptoms 3 months.

EXAM:
MRI HEAD WITHOUT AND WITH CONTRAST
TECHNIQUE: Multiplanar, multiecho pulse sequences of the brain and surrounding
structures were obtained without and with intravenous contrast.
CONTRAST:  10mL MULTIHANCE GADOBENATE DIMEGLUMINE 529 MG/ML IV SOLN

[Series 2: T1 · sagittal · 5.0mm · 0.45mm/px · 1 of 24 slices shown]
[im 1/24]
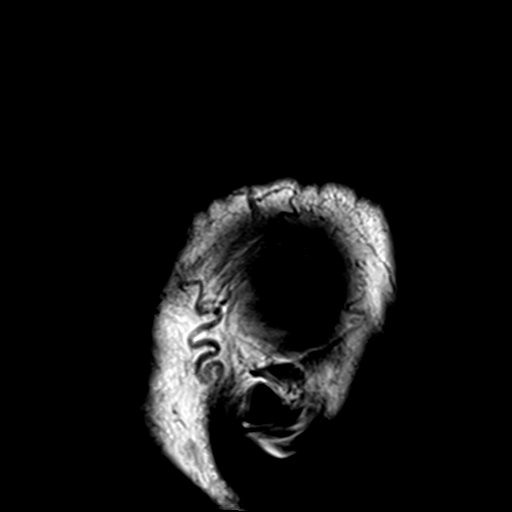

[Series 3: DWI · axial · 3.0mm · 1.80mm/px · z∈[-69,+81]mm · 8 of 104 slices shown]
[im 1/104]
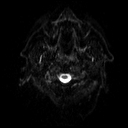
[im 12/104]
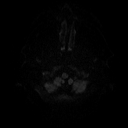
[im 35/104]
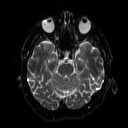
[im 46/104]
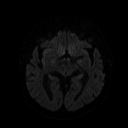
[im 58/104]
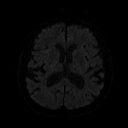
[im 69/104]
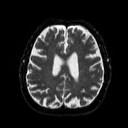
[im 92/104]
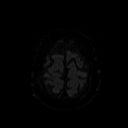
[im 104/104]
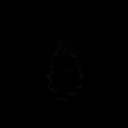

[Series 4: dwi_adc · axial · 3.0mm · 1.80mm/px · z∈[-69,+81]mm · 5 of 52 slices shown]
[im 1/52]
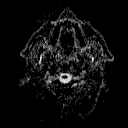
[im 13/52]
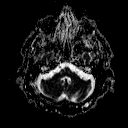
[im 26/52]
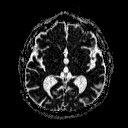
[im 39/52]
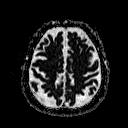
[im 52/52]
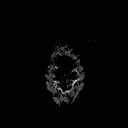

[Series 5: T2 · axial · 5.0mm · 0.36mm/px · z∈[-71,+82]mm · 2 of 25 slices shown (1 of 2)]
[im 1/25]
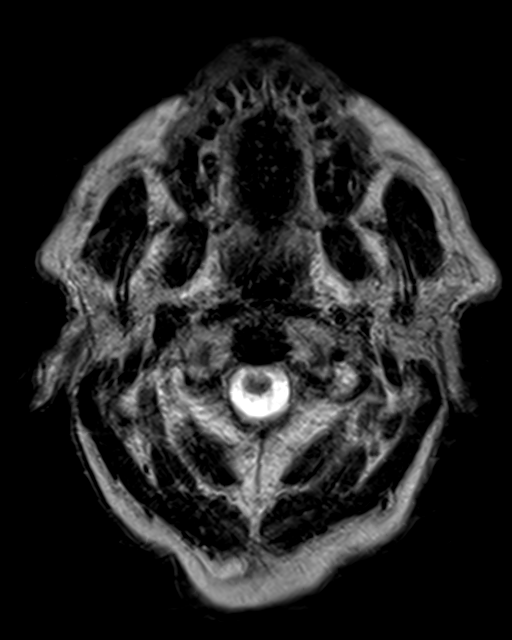
[im 25/25]
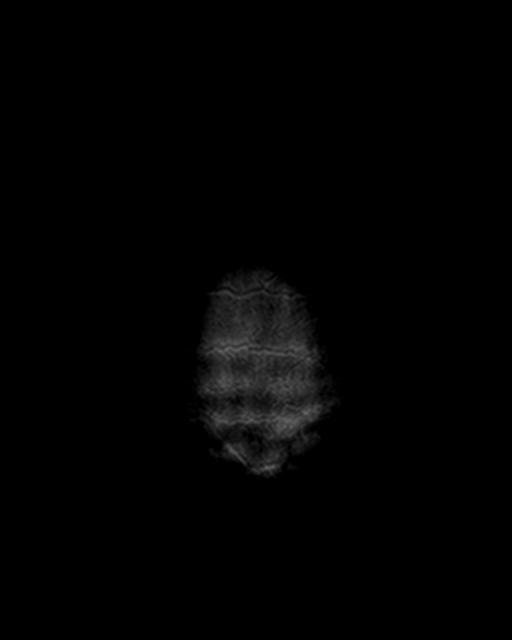

[Series 6: FLAIR · axial · 3.0mm · 0.45mm/px · z∈[-70,+81]mm · 3 of 34 slices shown]
[im 1/34]
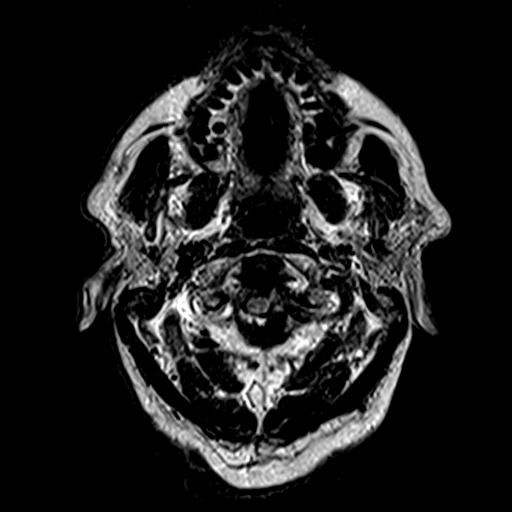
[im 17/34]
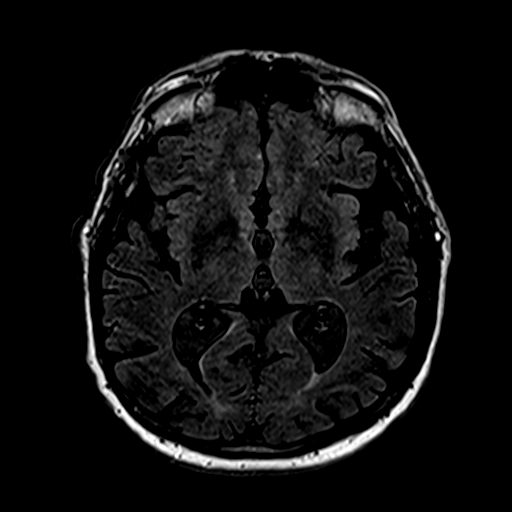
[im 34/34]
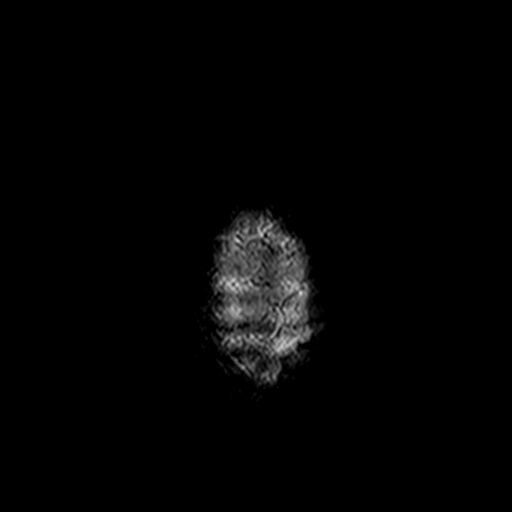

[Series 8: swi_images · axial · 4.0mm · 0.94mm/px · z∈[-70,+83]mm · 4 of 40 slices shown]
[im 1/40]
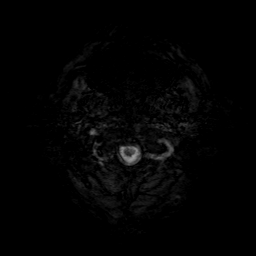
[im 14/40]
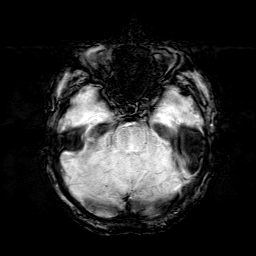
[im 27/40]
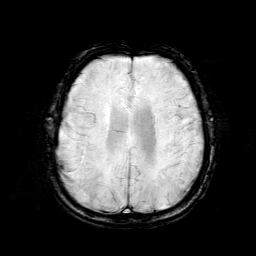
[im 40/40]
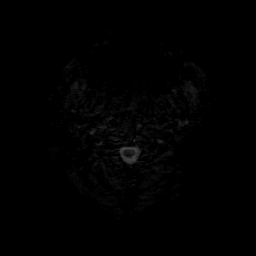

[Series 9: sag 3mm · sagittal · 3.0mm · 0.33mm/px · 1 of 11 slices shown]
[im 1/11]
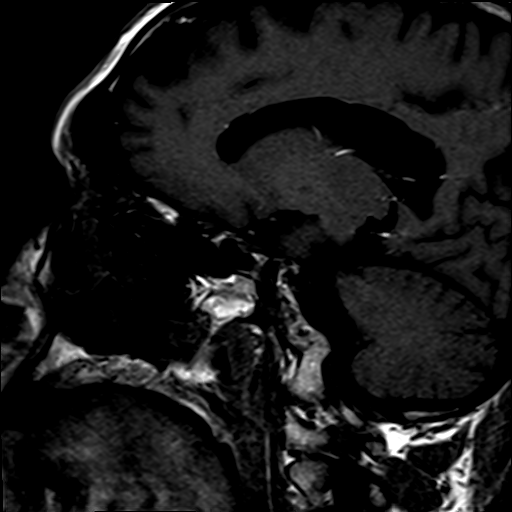

[Series 10: T2 · axial · 5.0mm · 0.36mm/px · z∈[-71,+82]mm · 2 of 25 slices shown (2 of 2)]
[im 1/25]
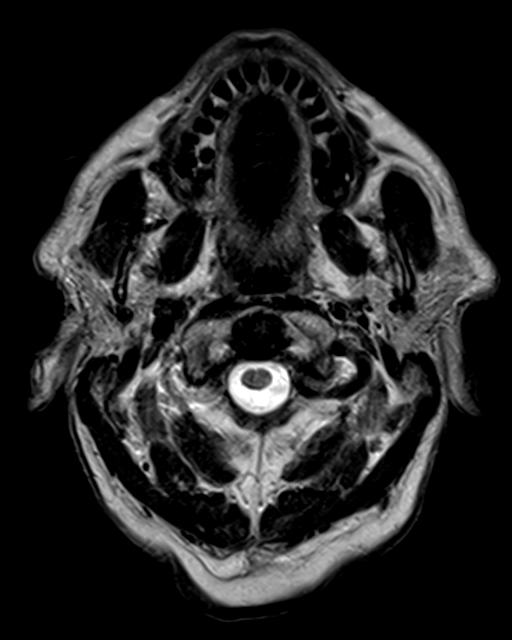
[im 25/25]
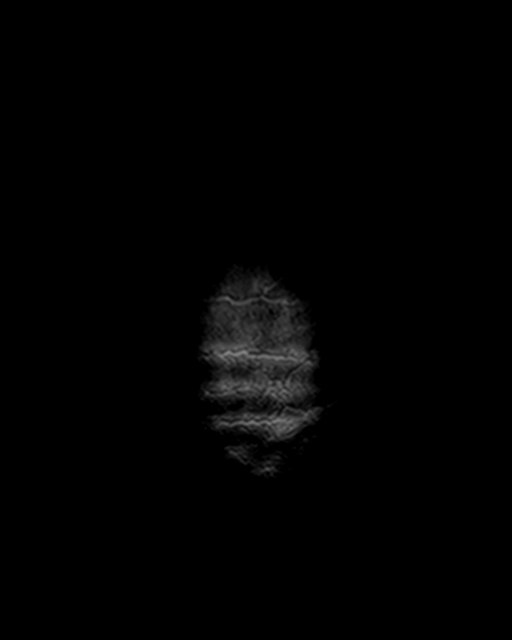

[Series 11: cor 3mm · coronal · 3.0mm · 0.33mm/px · 1 of 11 slices shown]
[im 1/11]
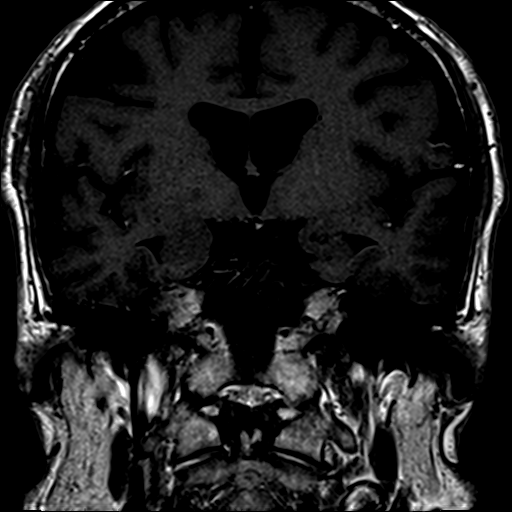

[Series 12: pre cor dynamic · coronal · non-contrast · 3.0mm · 0.35mm/px · 1 of 7 slices shown]
[im 1/7]
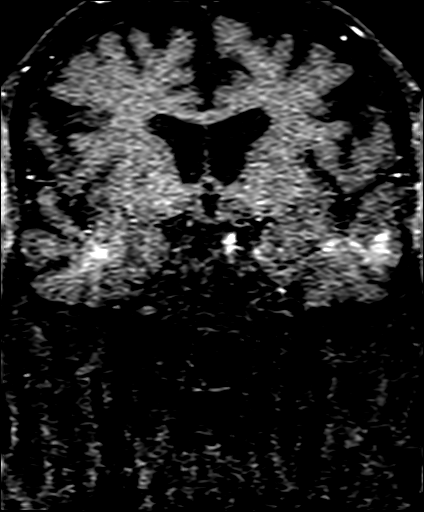

[Series 13: post fs cor · coronal · 3.0mm · 0.35mm/px · 1 of 7 slices shown (1 of 6)]
[im 1/7]
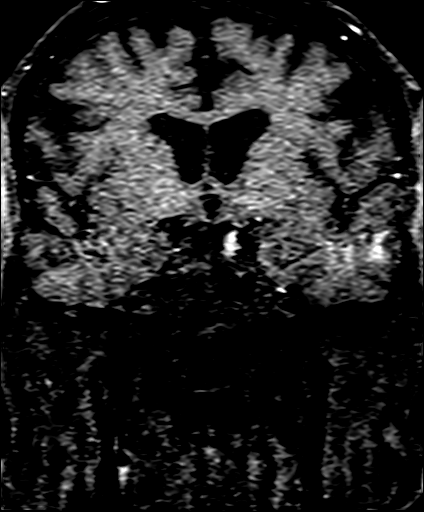

[Series 14: post fs cor · coronal · 3.0mm · 0.35mm/px · 1 of 7 slices shown (2 of 6)]
[im 1/7]
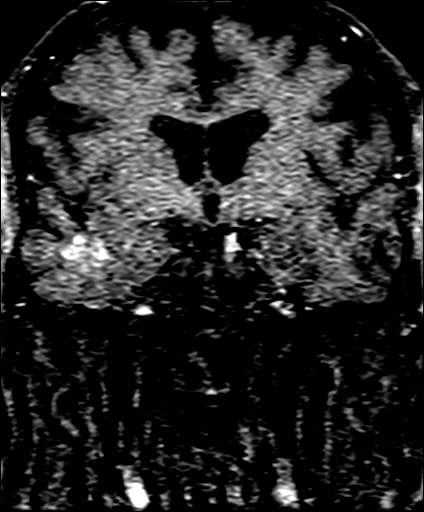

[Series 15: post fs cor · coronal · 3.0mm · 0.35mm/px · 1 of 7 slices shown (3 of 6)]
[im 1/7]
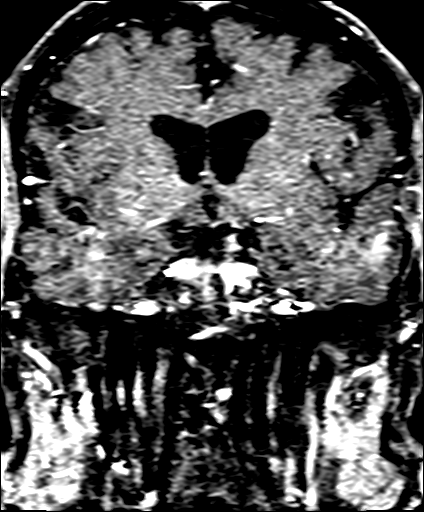

[Series 16: post fs cor · coronal · 3.0mm · 0.35mm/px · 1 of 7 slices shown (4 of 6)]
[im 1/7]
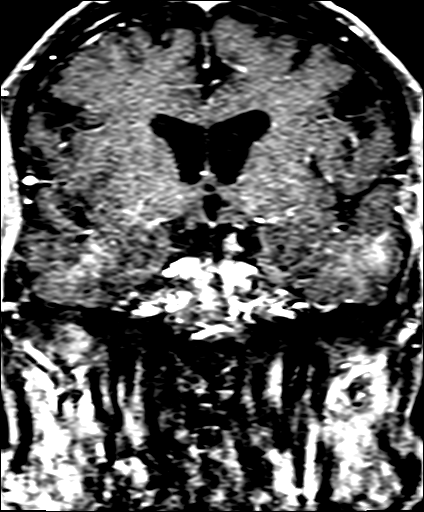

[Series 17: post fs cor · coronal · 3.0mm · 0.35mm/px · 1 of 7 slices shown (5 of 6)]
[im 1/7]
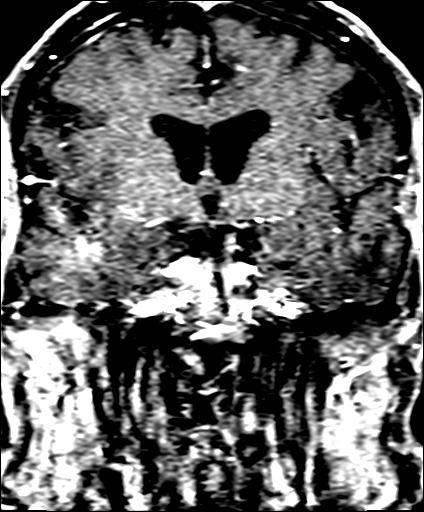

[Series 18: post fs cor · coronal · 3.0mm · 0.35mm/px · 1 of 7 slices shown (6 of 6)]
[im 1/7]
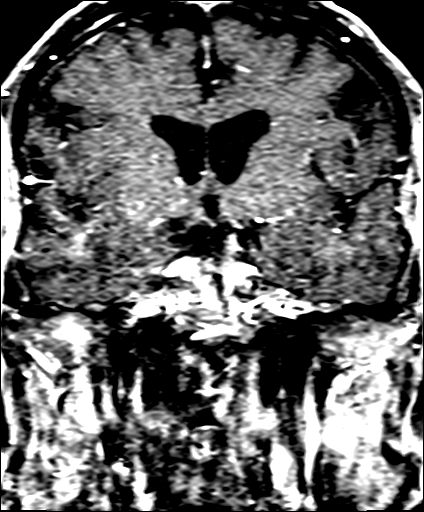

[34 of 48 positions shown; findings below may reference images not displayed]

FINDINGS: Brain: Diffusion imaging does not show any acute or subacute
infarction or other cause of restricted diffusion. No focal
abnormality affects the brainstem or cerebellum. Cerebral
hemispheres show age related volume loss without old or recent focal
small or large vessel infarction. No abnormality seen affecting the
hypothalamus. No hemorrhage, hydrocephalus or extra-axial
collection.

Hypothalamus and infundibulum are normal. Pituitary gland appears
normal size for age. No evidence of mass or apoplexy. No abnormal
enhancement occurs.

Vascular: Major vessels at the base of the brain show flow.

Skull and upper cervical spine: Negative

Sinuses/Orbits: Clear/normal

Other: None
IMPRESSION: Age related volume loss.  No acute or focal finding otherwise.

Normal appearance of the hypothalamus, infundibulum and pituitary
gland.

## 2023-06-05 ENCOUNTER — Other Ambulatory Visit: Payer: Self-pay | Admitting: Cardiology

## 2023-06-25 DIAGNOSIS — Z7901 Long term (current) use of anticoagulants: Secondary | ICD-10-CM | POA: Diagnosis not present

## 2023-07-09 ENCOUNTER — Ambulatory Visit: Payer: Medicare Other | Admitting: Neurology

## 2023-07-09 ENCOUNTER — Encounter: Payer: Self-pay | Admitting: Neurology

## 2023-07-09 VITALS — BP 98/62 | HR 61 | Resp 18 | Ht 70.0 in

## 2023-07-09 DIAGNOSIS — G3184 Mild cognitive impairment, so stated: Secondary | ICD-10-CM | POA: Diagnosis not present

## 2023-07-09 DIAGNOSIS — G40009 Localization-related (focal) (partial) idiopathic epilepsy and epileptic syndromes with seizures of localized onset, not intractable, without status epilepticus: Secondary | ICD-10-CM

## 2023-07-09 MED ORDER — LEVETIRACETAM 500 MG PO TABS: ORAL_TABLET | ORAL | 3 refills | Status: DC

## 2023-07-09 MED ORDER — MEMANTINE HCL 5 MG PO TABS: ORAL_TABLET | ORAL | 11 refills | Status: DC

## 2023-07-09 NOTE — Patient Instructions (Signed)
 Good to see you!  Start Memantine 5mg : take 1 tablet at bedtime for 2 weeks, then increase to 1 tablet twice a day  2. Continue Levetiracetam 500mg : take 1 tablet in AM, 1 and 1/2 tablets in PM  3. Follow-up in 6 months, call for any changes   Seizure Precautions: 1. If medication has been prescribed for you to prevent seizures, take it exactly as directed.  Do not stop taking the medicine without talking to your doctor first, even if you have not had a seizure in a long time.   2. Avoid activities in which a seizure would cause danger to yourself or to others.  Don't operate dangerous machinery, swim alone, or climb in high or dangerous places, such as on ladders, roofs, or girders.  Do not drive unless your doctor says you may.  3. If you have any warning that you may have a seizure, lay down in a safe place where you can't hurt yourself.    4.  No driving for 6 months from last seizure, as per [facility].   Please refer to the following link on the [employer]'s website for more information: http://www.epilepsyfoundation.org/answerplace/Social/driving/drivingu.cfm   5.  Maintain good sleep hygiene. Avoid alcohol.  6.  Contact your doctor if you have any problems that may be related to the medicine you are taking.  7.  Call 911 and bring the patient back to the ED if:        A.  The seizure lasts longer than 5 minutes.       B.  The patient doesn't awaken shortly after the seizure  C.  The patient has new problems such as difficulty seeing, speaking or moving  D.  The patient was injured during the seizure  E.  The patient has a temperature over 102 F (39C)  F.  The patient vomited and now is having trouble breathing        FALL PRECAUTIONS: Be cautious when walking. Scan the area for obstacles that may increase the risk of trips and falls. When getting up in the mornings, sit up at the edge of the bed for a few minutes before getting out of bed.  Consider elevating the bed at the head end to avoid drop of blood pressure when getting up. Walk always in a well-lit room (use night lights in the walls). Avoid area rugs or power cords from appliances in the middle of the walkways. Use a walker or a cane if necessary and consider physical therapy for balance exercise. Get your eyesight checked regularly.  HOME SAFETY: Consider the safety of the kitchen when operating appliances like stoves, microwave oven, and blender. Consider having supervision and share cooking responsibilities until no longer able to participate in those. Accidents with firearms and other hazards in the house should be identified and addressed as well.  ABILITY TO BE LEFT ALONE: If patient is unable to contact 911 operator, consider using [employer], or when the need is there, arrange for someone to stay with patients. Smoking is a fire hazard, consider supervision or cessation. Risk of wandering should be assessed by caregiver and if detected at any point, supervision and safe proof recommendations should be instituted.   RECOMMENDATIONS FOR ALL PATIENTS WITH MEMORY PROBLEMS: 1. Continue to exercise (Recommend 30 minutes of walking everyday, or 3 hours every week) 2. Increase social interactions - continue going to [place] and enjoy social gatherings with friends and family 3. Eat healthy, avoid  fried foods and eat more fruits and vegetables 4. Maintain adequate blood pressure, blood sugar, and blood cholesterol level. Reducing the risk of stroke and cardiovascular disease also helps promoting better memory. 5. Avoid stressful situations. Live a simple life and avoid aggravations. Organize your time and prepare for the next day in anticipation. 6. Sleep well, avoid any interruptions of sleep and avoid any distractions in the bedroom that may interfere with adequate sleep quality 7. Avoid sugar, avoid sweets as there is a strong link between excessive sugar intake, diabetes, and  cognitive impairment The Mediterranean diet has been shown to help patients reduce the risk of progressive memory disorders and reduces cardiovascular risk. This includes eating fish, eat fruits and green leafy vegetables, nuts like almonds and hazelnuts, walnuts, and also use olive oil. Avoid fast foods and fried foods as much as possible. Avoid sweets and sugar as sugar use has been linked to worsening of memory function.  There is always a concern of gradual progression of memory problems. If this is the case, then we may need to adjust level of care according to patient needs. Support, both to the patient and caregiver, should then be put into place.

## 2023-07-09 NOTE — Progress Notes (Signed)
 NEUROLOGY FOLLOW UP OFFICE NOTE  ALIN CHAVIRA [id number] 07-Feb-1946  HISTORY OF PRESENT ILLNESS: I had the pleasure of seeing Manly Nestle in follow-up in the neurology clinic on 07/09/2023.  The patient was last seen 6 months ago for seizures and memory loss. He is again accompanied by his wife who helps supplement the history today.  Records and images were personally reviewed where available. Since his last visit, they deny any focal impaired awareness seizures since 07/06/22. Levetiracetam was increased on last visit to 500mg  in AM, 750mg  in PM, which he is tolerating without significant side effects. He denies any staring/unresponsive episodes, gaps in time, olfactory/gustatory hallucinations, focal numbness/tingling/weakness, myoclonic jerks. Every once in a while he has a headache. His wife reminds him he talks frequently about pain around his face (?sinuses) that are different from his migraines. His wife thinks it is due to allergies, worse when pollen is out, but he still has them even if pollen is down. No dizziness or falls. Memory is "spotty," he continues to have good and bad days. MMSE 24/30 in 12/2022. His wife manages medications, finances. He says he drives but his wife reports he has bilateral cataracts and his optometrist thinks he should not be driving. Mood is okay, his wife reports he is a little testy as night, stable from last visit. No paranoia or hallucinations. He gets 7-8 hours of sleep, naps some days.    History on Initial Assessment 11/07/2021: This is a pleasant 78 year old right-handed man with a history of mechanical aortic valve replacement, atrial fibrillation on Coumadin, presenting for evaluation of new onset seizures. He was in his usual state of health, went to bed feeling fine and woke up to use the bathroom with no issues earlier that night, went back to sleep, then next recollection is coming to in the hospital. His wife reports that at around 4am she  heard him make a guttural sound and found him convulsing with his head turned to the left side and arms flexed over his chest. This lasted a couple of minutes followed by sonorous respiration. No focal weakness, he was confused when EMS arrived but started answering questions and was back to baseline in the ER. In the ER, he was found to have a small abrasion on the right side of his tongue. Bloodwork showed normal sodium 137, creatinine slightly elevated 1.33, normal WBC and glucose (104). I personally reviewed head CT without contrast which did not show any acute changes, there was moderate diffuse atrophy. EKG showed sinus tachycardia, LAE, consider biatrial enlargement, LBBB. He was discharged home on Levetiracetam 500mg  BID. He only took one dose yesterday and it made him sleepy. He did not take dose today.    He denies any prior history of convulsions, however his wife reports an incident last May 2023 on Mother's Day, they were talking when he suddenly stared off and was unresponsive for a few minutes. By the time EMS arrived, he started responding and vital signs were normal. He saw his cardiologist afterwards who noted his sodium level and Hct were low, so he was admitted to the hospital. His wife recalls a different episode in 2017 or 2018 while he was in the car and suddenly just stopped talking, she would see he was looking for his words with extended pauses but was not staring off. He denies any olfactory/gustatory hallucinations, deja vu, rising epigastric sensation, focal numbness/tingling/weakness, myoclonic jerks. Since the seizure, he has noticed pain in his left  buttock and hip, with pain on hip flexion. 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 He usually takes Tylenol with good response. No dizziness, diplopia, dysarthria/dysphagia, neck pain, bowel/bladder dysfunction. He usually gets 8  hours of sleep. He drinks 1-2 beers daily but his wife reminds him he had more alcohol than usual the day prior when they had a family gathering. Memory is "so-so." His wife started noticing more confusion and memory changes 4-5 years ago, losing his keys, phone, and wallet more often. He was getting confused with directions, going to a different place from where she told him to go. He manages his own Coumadin, but she took over his other medications 1.5 years ago because he was forgetting them. He continues to manage finances without issues. Mood is pretty good, no personality changes, paranoia, or hallucinations.   I personally reviewed MRI brain with and without contrast done 12/2020 for "increased thirst, confusion," no acute changes, there was moderate diffuse atrophy.   Epilepsy Risk Factors:  He had a normal birth and early development.  There is no history of febrile convulsions, CNS infections such as meningitis/encephalitis, significant traumatic brain injury, neurosurgical procedures, or family history of seizures.  Diagnostic Data: MRI brain with and without contrast done 11/2021 did not show any acute changes, there was mild diffuse atrophy.  EEG in 12/2021 was normal.  PAST MEDICAL HISTORY: Past Medical History:  Diagnosis Date   A-fib [facility])    Atrial flutter with rapid ventricular response (HCC) 03/16/2021   Cholelithiasis 2011   Chronic anticoagulation 03/03/2023   Colon, diverticulosis    H/O heart valve replacement with mechanical valve 03/03/2023   Bjork-Shiley tilting disc valve in 1983 at [facility] in [place], [place].        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   Stricture of sigmoid colon s/p colectomy 2007 2007    MEDICATIONS: Current Outpatient Medications on File Prior to Visit  Medication Sig Dispense Refill   amiodarone (PACERONE) 200 MG tablet Take 1 tablet (200 mg total) by mouth daily. 90 tablet 3   Cholecalciferol  (VITAMIN D3) 1000 units CAPS Take 1,000 Units by mouth daily.     empagliflozin (JARDIANCE) 10 MG TABS tablet Take 1 tablet (10 mg total) by mouth daily. 90 tablet 3   furosemide (LASIX) 40 MG tablet Take 1 tablet (40 mg total) by mouth in the morning. 30 tablet 0   levETIRAcetam (KEPPRA) 500 MG tablet Take 1 tablet in AM and 1 and 1/2 tablets in PM (Patient taking differently: Take 500-750 mg by mouth See admin instructions. Take 500 mg by mouth in the morning and 750 mg in the evening) 225 tablet 1   magnesium oxide (MAG-OX) 400 (240 Mg) MG tablet Take 400 mg by mouth daily.     metoprolol succinate (TOPROL-XL) 25 MG 24 hr tablet Take 0.5 tablets (12.5 mg total) by mouth daily. 45 tablet 3   ondansetron (ZOFRAN) 4 MG tablet Take 4 mg by mouth every 8 (eight) hours as needed.     pantoprazole (PROTONIX) 40 MG tablet TAKE 1 TABLET BY MOUTH TWICE A DAY 180 tablet 3   TYLENOL 500 MG tablet Take 500 mg by mouth every 6 (six) hours as needed for mild pain (pain score 1-3) or headache.     warfarin (COUMADIN) 6 MG tablet Take 3-6 mg by mouth See admin instructions. Take 6  mg by mouth in the morning on Sun/Mon/Wed/Thurs/Fri/Sat and 3 mg on Tues     doxycycline (VIBRA-TABS) 100 MG tablet Take 100 mg by mouth See admin instructions. Take 100 mg by mouth 30-60 minutes prior to dental procedures (Patient not taking: Reported on 07/09/2023)     ferrous sulfate 325 (65 FE) MG tablet Take 325 mg by mouth daily with breakfast.     IMODIUM A-D 2 MG tablet Take 2 mg by mouth 4 (four) times daily as needed for diarrhea or loose stools. (Patient not taking: Reported on 07/09/2023)     No current facility-administered medications on file prior to visit.    ALLERGIES: Allergies  Allergen Reactions   Amoxicillin-Pot Clavulanate Rash   Codeine Other (See Comments)    Stomach upset    FAMILY HISTORY: History reviewed. No pertinent family history.  SOCIAL HISTORY: Social History   Socioeconomic History    Marital status: Married    Spouse name: Not on file   Number of children: Not on file   Years of education: Not on file   Highest education level: Not on file  Occupational History   Not on file  Tobacco Use   Smoking status: Some Days    Types: Cigarettes, Cigars   Smokeless tobacco: Never  Vaping Use   Vaping status: Never Used  Substance and Sexual Activity   Alcohol use: Not Currently    Comment: daily   Drug use: Never   Sexual activity: Not on file  Other Topics Concern   Not on file  Social History Narrative   Right handed    Lives with family    Retired    Drinks a etoh free beer a day    Social Drivers of Corporate investment banker Strain: Not on file  Food Insecurity: No Food Insecurity (03/03/2023)   Hunger Vital Sign    Worried About Running Out of Food in the Last Year: Never true    Ran Out of Food in the Last Year: Never true  Transportation Needs: No Transportation Needs (03/03/2023)   PRAPARE - Administrator, Civil Service (Medical): No    Lack of Transportation (Non-Medical): No  Physical Activity: Not on file  Stress: Not on file  Social Connections: Not on file  Intimate Partner Violence: Not At Risk (03/03/2023)   Humiliation, Afraid, Rape, and Kick questionnaire    Fear of Current or Ex-Partner: No    Emotionally Abused: No    Physically Abused: No    Sexually Abused: No     PHYSICAL EXAM: Vitals:   07/09/23 0823  BP: 98/62  Pulse: 61  Resp: 18  SpO2: 97%   General: No acute distress Head:  Normocephalic/atraumatic Skin/Extremities: No rash, no edema Neurological Exam: alert and oriented to person, place, and month. Year is 2024. No aphasia or dysarthria. Fund of knowledge is appropriate.  Recent and remote memory are impaired, 0/3 delayed recall.  Attention and concentration are normal, 3/5 WORLD backwards. Able to name and repeat.   Cranial nerves: Pupils equal, round. Extraocular movements intact with no nystagmus.  Visual fields full.  No facial asymmetry.  Motor: Bulk and tone normal, muscle strength 5/5 throughout with no pronator drift.   Finger to nose testing intact.  Gait narrow-based and steady, no ataxia. No tremors.   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 His last focal impaired awareness seizure was 07/06/22, continue Levetiracetam 500mg  in AM, 750mg  in PM. 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 We discussed starting Memantine (avoiding cholinesterase inhibitors due to bradycardia), side effects and expectations discussed. Start Memantine 5mg  at bedtime for 2 weeks, then increase to 5mg  BID. This may help with headaches as well, however headaches described appear to be more sinus-related, discuss with PCP. He is aware of [place] driving laws to stop driving after a seizure until 6 months seizure-free, driving is currently restricted by vision as well. Follow-up in 6 months, call for any changes.    Thank you for allowing me to participate in his care.  Please do not hesitate to call for any questions or concerns.    Patrcia Dolly, M.D.   CC: Dr. Hyacinth Meeker

## 2023-07-11 DIAGNOSIS — H25013 Cortical age-related cataract, bilateral: Secondary | ICD-10-CM | POA: Diagnosis not present

## 2023-07-11 DIAGNOSIS — H2513 Age-related nuclear cataract, bilateral: Secondary | ICD-10-CM | POA: Diagnosis not present

## 2023-07-11 DIAGNOSIS — H18413 Arcus senilis, bilateral: Secondary | ICD-10-CM | POA: Diagnosis not present

## 2023-07-11 DIAGNOSIS — H2512 Age-related nuclear cataract, left eye: Secondary | ICD-10-CM | POA: Diagnosis not present

## 2023-07-11 DIAGNOSIS — H25043 Posterior subcapsular polar age-related cataract, bilateral: Secondary | ICD-10-CM | POA: Diagnosis not present

## 2023-07-23 DIAGNOSIS — Z7901 Long term (current) use of anticoagulants: Secondary | ICD-10-CM | POA: Diagnosis not present

## 2023-07-30 DIAGNOSIS — Z7901 Long term (current) use of anticoagulants: Secondary | ICD-10-CM | POA: Diagnosis not present

## 2023-08-03 ENCOUNTER — Other Ambulatory Visit: Payer: Self-pay | Admitting: Neurology

## 2023-08-12 DIAGNOSIS — Z7901 Long term (current) use of anticoagulants: Secondary | ICD-10-CM | POA: Diagnosis not present

## 2023-08-16 DIAGNOSIS — Z7901 Long term (current) use of anticoagulants: Secondary | ICD-10-CM | POA: Diagnosis not present

## 2023-08-26 DIAGNOSIS — Z7901 Long term (current) use of anticoagulants: Secondary | ICD-10-CM | POA: Diagnosis not present

## 2023-08-29 ENCOUNTER — Telehealth: Payer: Self-pay | Admitting: Cardiology

## 2023-08-29 DIAGNOSIS — Z23 Encounter for immunization: Secondary | ICD-10-CM | POA: Diagnosis not present

## 2023-08-29 DIAGNOSIS — Z7901 Long term (current) use of anticoagulants: Secondary | ICD-10-CM | POA: Diagnosis not present

## 2023-08-29 DIAGNOSIS — R519 Headache, unspecified: Secondary | ICD-10-CM | POA: Diagnosis not present

## 2023-08-29 DIAGNOSIS — Z952 Presence of prosthetic heart valve: Secondary | ICD-10-CM | POA: Diagnosis not present

## 2023-08-29 DIAGNOSIS — N1831 Chronic kidney disease, stage 3a: Secondary | ICD-10-CM | POA: Diagnosis not present

## 2023-08-29 NOTE — Telephone Encounter (Signed)
 Pt c/o medication issue:  1. Name of Medication:   metoprolol  succinate (TOPROL -XL) 25 MG 24 hr tablet    2. How are you currently taking this medication (dosage and times per day)? As written  3. Are you having a reaction (difficulty breathing--STAT)? No   4. What is your medication issue? Dr Perley Bradley wants to know what Dr Audery Blazing thinks of taking pt off this medication. He is dizzy at least one time a week, BP is low and HR is well controlled. Please advise

## 2023-08-29 NOTE — Telephone Encounter (Signed)
.   I spoke with the pts wife and she notes that his HR has been in the low 60's... BP has been consistently 90/50. He has been tired and has been having daily headaches. He has been sleeping more than usual.   His PCP suggested that he stop the metoprolol  and asking for Dr Audery Blazing to review.  Pts wife says that he has been eating and drinking well.

## 2023-08-30 NOTE — Telephone Encounter (Signed)
 Lenise Quince, MD  Render Carrie, RN3 hours ago (7:26 AM)    Ok to stop metoprolol  and follow HR; needs fu APPov Alexandria Angel   Spoke with pt's wife, Genevia Kern (ok per [facility]) regarding Dr. Lourdes Roy recommendations. Pt will stop metoprolol . Pt did take dose this morning so they will start tomorrow. Wife states she will continue to track his heart rate and they will discuss if symptoms improve at upcoming office visit on 6/3 with Dr. Audery Blazing. Wife verbalizes understanding.

## 2023-09-02 DIAGNOSIS — N289 Disorder of kidney and ureter, unspecified: Secondary | ICD-10-CM | POA: Diagnosis not present

## 2023-09-02 DIAGNOSIS — D696 Thrombocytopenia, unspecified: Secondary | ICD-10-CM | POA: Diagnosis not present

## 2023-09-03 ENCOUNTER — Telehealth: Payer: Self-pay | Admitting: Physician Assistant

## 2023-09-03 NOTE — Telephone Encounter (Signed)
 Notified by PCP regarding recent thrombocytopenia. Two of recent blood work showed low platelet of 70s, most recent one was 71. He is on warfarin for mechanical aortic valve. Patient also has a history of afib/aflutter, essentially rate controlled on amiodarone . His BP has been low where we have been unable to add rate control med. His PCP is stopping his protonix . I discussed with two of our clinical pharmacist, suspicion that his lasix  causing thrombocytopenia is low, however amiodarone  has a very tiny possibility of causing thrombocytopenia. PCP plans to repeat platelet level this Thursday.   Triage, please contact the patient and schedule an appt to see me later next week. (Ok to take a PV slot) I can repeat a CBC at the time to trend his platelet. Depend on his platelet, may need to stop amio and refer to hematology.

## 2023-09-03 NOTE — Telephone Encounter (Signed)
 Left message for patient to call back

## 2023-09-04 NOTE — Progress Notes (Signed)
 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  Patient admitted with GI bleed November 2024 due to Mallory-Weiss tear which was treated with hemostatic clip.  CTA November 2024 showed subpleural consolidation in the left lower lobe.  Follow-up recommended in 3 months.  No thrombus noted in the left atrium appendage.  Calcium score 588.  Echocardiogram November 2024 showed ejection fraction 40 to 45%, severe biatrial enlargement, moderate to severe mitral stenosis, mild to moderate mitral regurgitation, prior mechanical aortic valve.  Decreased ejection fraction felt possibly secondary to tachycardia.  Patient underwent cardioversion at that time but atrial flutter recurred and he was felt to be a failure of tikosyn .  He was therefore initiated on amiodarone . Follow-up monitor showed persistent atrial flutter and rate control was pursued as he was asymptomatic.  Since he was last seen patient denies dyspnea, chest pain, palpitations, syncope or bleeding.  He has some fatigue.  Current Outpatient Medications  Medication Sig Dispense Refill   Cholecalciferol (VITAMIN D3) 1000 units CAPS Take 1,000 Units by mouth daily.     doxycycline  (VIBRA -TABS) 100 MG tablet Take 100 mg by mouth See admin instructions. Take 100 mg by mouth 30-60 minutes prior to dental procedures     empagliflozin  (JARDIANCE ) 10 MG TABS tablet Take 1 tablet (10 mg total) by mouth daily. 90 tablet 3   ferrous sulfate 325 (65 FE) MG tablet Take 325 mg by mouth daily with breakfast.     furosemide  (LASIX ) 20 MG tablet Take 20 mg by mouth daily.     IMODIUM A-D 2 MG tablet Take 2 mg by mouth 4 (four) times daily as needed for diarrhea or loose stools.     levETIRAcetam  (KEPPRA ) 500 MG tablet Take 1 tablet in AM and 1 and 1/2 tablets in PM 225 tablet 3   magnesium  oxide (MAG-OX) 400 (240 Mg) MG tablet  Take 400 mg by mouth daily.     ondansetron  (ZOFRAN ) 4 MG tablet Take 4 mg by mouth every 8 (eight) hours as needed.     TYLENOL  500 MG tablet Take 500 mg by mouth every 6 (six) hours as needed for mild pain (pain score 1-3) or headache.     warfarin (COUMADIN ) 6 MG tablet Take 3-6 mg by mouth See admin instructions. Take 6 mg by mouth in the morning on Sun/Mon/Wed/Thurs/Fri/Sat and 3 mg on Tues     amiodarone  (PACERONE ) 200 MG tablet Take 1 tablet (200 mg total) by mouth daily. 90 tablet 3   No current facility-administered medications for this visit.     Past Medical History:  Diagnosis Date   A-fib [facility])    Atrial flutter with rapid ventricular response (HCC) 03/16/2021   Cholelithiasis 2011   Chronic anticoagulation 03/03/2023   Colon, diverticulosis    H/O heart valve replacement with mechanical valve 03/03/2023   Bjork-Shiley tilting disc valve in 1983 at [facility] in [place], [place] .        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   Stricture of sigmoid colon s/p colectomy 2007 2007    Past Surgical History:  Procedure Laterality Date   ATRIAL FIBRILLATION ABLATION     BIOPSY  03/05/2023   Procedure: BIOPSY;  Surgeon: Genell Ken, MD;  Location: [facility];  Service: Gastroenterology;;   CARDIOVERSION N/A 03/13/2023   Procedure: CARDIOVERSION;  Surgeon: Bridgette Campus, MD;  Location: [facility] INVASIVE CV  LAB;  Service: Cardiovascular;  Laterality: N/A;   CARDIOVERSION N/A 03/13/2023   Procedure: CARDIOVERSION;  Surgeon: Bridgette Campus, MD;  Location: [facility] INVASIVE CV LAB;  Service: Cardiovascular;  Laterality: N/A;   COLON RESECTION SIGMOID  07/26/2005   Dr Sofia Dunn for diverticular sigmoid colon stricture   ESOPHAGOGASTRODUODENOSCOPY (EGD) WITH PROPOFOL  N/A 03/05/2023   Procedure: ESOPHAGOGASTRODUODENOSCOPY (EGD) WITH PROPOFOL ;  Surgeon: Genell Ken, MD;  Location: [facility] ENDOSCOPY;  Service: Gastroenterology;  Laterality: N/A;   HEMOSTASIS CLIP  PLACEMENT  03/05/2023   Procedure: HEMOSTASIS CLIP PLACEMENT;  Surgeon: Genell Ken, MD;  Location: [facility] ENDOSCOPY;  Service: Gastroenterology;;   LAPAROSCOPIC INGUINAL HERNIA REPAIR Bilateral 04/03/1999   Dr Eulogio Hidalgo   VALVE REPLACEMENT      Social History   Socioeconomic History   Marital status: Married    Spouse name: Not on file   Number of children: Not on file   Years of education: Not on file   Highest education level: Not on file  Occupational History   Not on file  Tobacco Use   Smoking status: Some Days    Types: Cigarettes, Cigars   Smokeless tobacco: Never  Vaping Use   Vaping status: Never Used  Substance and Sexual Activity   Alcohol use: Not Currently    Comment: daily   Drug use: Never   Sexual activity: Not on file  Other Topics Concern   Not on file  Social History Narrative   Right handed    Lives with family    Retired    Drinks a etoh free beer a day    Social Drivers of Corporate investment banker Strain: Not on file  Food Insecurity: No Food Insecurity (03/03/2023)   Hunger Vital Sign    Worried About Running Out of Food in the Last Year: Never true    Ran Out of Food in the Last Year: Never true  Transportation Needs: No Transportation Needs (03/03/2023)   PRAPARE - Administrator, Civil Service (Medical): No    Lack of Transportation (Non-Medical): No  Physical Activity: Not on file  Stress: Not on file  Social Connections: Not on file  Intimate Partner Violence: Not At Risk (03/03/2023)   Humiliation, Afraid, Rape, and Kick questionnaire    Fear of Current or Ex-Partner: No    Emotionally Abused: No    Physically Abused: No    Sexually Abused: No    History reviewed. No pertinent family history.  ROS: no fevers or chills, productive cough, hemoptysis, dysphasia, odynophagia, melena, hematochezia, dysuria, hematuria, rash, seizure activity, orthopnea, PND, pedal edema, claudication. Remaining systems are  negative.  Physical Exam: Well-developed well-nourished in no acute distress.  Skin is warm and dry.  HEENT is normal.  Neck is supple.  Chest is clear to auscultation with normal expansion.  Cardiovascular exam is bradycardic, irregular; 3/6 systolic murmur left sternal border; crisp mechanical valve sound Abdominal exam nontender or distended. No masses palpated. Extremities show no edema. neuro grossly intact  EKG Interpretation Date/Time:  Tuesday September 17 2023 07:50:45 EDT Ventricular Rate:  47 PR Interval:    QRS Duration:  142 QT Interval:  548 QTC Calculation: 484 R Axis:   -67  Text Interpretation: Atrial fibrillation with slow ventricular response with a competing junctional pacemaker Left axis deviation Non-specific intra-ventricular conduction block Minimal voltage criteria for LVH, may be normal variant ( Cornell product ) Confirmed by Alexandria Angel ([id number]) on 09/17/2023 7:52:56 AM    A/P  1  persistent atrial fibrillation/flutter-patient was previously cardioverted on Tikosyn  but atrial arrhythmias recurred.  He is on amiodarone  for rate control as he had some degree of dizziness in the past secondary to low blood pressure.  His heart rate is slow today.  Will decrease amiodarone  to 100 mg daily.  Check 3-day Zio patch in 4 weeks.  If heart rate remains low will discontinue amiodarone  and follow.  Continue Coumadin .  2 status post aortic valve replacement-continue Coumadin  with goal INR 2-3.  3 mitral stenosis-moderate to severe on most recent echocardiogram.  Repeat echocardiogram.  4 Reduced LV function-this was felt likely due to the tachycardia.  Will plan to repeat echocardiogram now that heart rate is controlled.  Hopefully LV function will have normalized.  5 left lower lobe pulmonary consolidation-plan repeat noncontrast chest CT.  6 elevated calcium score-patient denies chest pain.  Will add Crestor 20 mg daily.  Check lipids and liver in 8 weeks.  7  thrombocytopenia-I have asked patient to follow-up with primary care for this issue as it is new.    Alexandria Angel, MD

## 2023-09-05 DIAGNOSIS — D696 Thrombocytopenia, unspecified: Secondary | ICD-10-CM | POA: Diagnosis not present

## 2023-09-05 DIAGNOSIS — Z7901 Long term (current) use of anticoagulants: Secondary | ICD-10-CM | POA: Diagnosis not present

## 2023-09-06 NOTE — Telephone Encounter (Signed)
 Wife Genevia Kern) returned RN's call.  Patient reminded of appointment on 6/3 and upcoming CBC test due.

## 2023-09-06 NOTE — Telephone Encounter (Signed)
 Attempted to reach patient, but no answer. He's currently scheduled for next week with Hochrein (09/17/23). Placed note on appt to repeat CBC. Routing to [place], [place] to make aware.

## 2023-09-06 NOTE — Telephone Encounter (Signed)
 Noted

## 2023-09-12 DIAGNOSIS — D696 Thrombocytopenia, unspecified: Secondary | ICD-10-CM | POA: Diagnosis not present

## 2023-09-12 DIAGNOSIS — Z7901 Long term (current) use of anticoagulants: Secondary | ICD-10-CM | POA: Diagnosis not present

## 2023-09-16 ENCOUNTER — Ambulatory Visit: Payer: Self-pay | Admitting: Physician Assistant

## 2023-09-17 ENCOUNTER — Other Ambulatory Visit: Payer: Self-pay | Admitting: Cardiology

## 2023-09-17 ENCOUNTER — Encounter: Payer: Self-pay | Admitting: Cardiology

## 2023-09-17 ENCOUNTER — Ambulatory Visit: Payer: Medicare Other | Attending: Cardiology | Admitting: Cardiology

## 2023-09-17 VITALS — BP 106/72 | HR 44 | Ht 70.0 in | Wt 164.0 lb

## 2023-09-17 DIAGNOSIS — E785 Hyperlipidemia, unspecified: Secondary | ICD-10-CM

## 2023-09-17 DIAGNOSIS — I051 Rheumatic mitral insufficiency: Secondary | ICD-10-CM | POA: Diagnosis not present

## 2023-09-17 DIAGNOSIS — I050 Rheumatic mitral stenosis: Secondary | ICD-10-CM

## 2023-09-17 DIAGNOSIS — J984 Other disorders of lung: Secondary | ICD-10-CM | POA: Diagnosis not present

## 2023-09-17 DIAGNOSIS — I4819 Other persistent atrial fibrillation: Secondary | ICD-10-CM

## 2023-09-17 DIAGNOSIS — Z952 Presence of prosthetic heart valve: Secondary | ICD-10-CM | POA: Diagnosis not present

## 2023-09-17 MED ORDER — ROSUVASTATIN CALCIUM 20 MG PO TABS: 20.0000 mg | ORAL_TABLET | Freq: Every day | ORAL | 3 refills | Status: AC

## 2023-09-17 MED ORDER — AMIODARONE HCL 100 MG PO TABS: 100.0000 mg | ORAL_TABLET | Freq: Every day | ORAL | 3 refills | Status: DC

## 2023-09-17 MED ORDER — AMIODARONE HCL 200 MG PO TABS: 200.0000 mg | ORAL_TABLET | Freq: Every day | ORAL | 3 refills | Status: DC

## 2023-09-17 NOTE — Patient Instructions (Signed)
 Medication Instructions:   Decrease amiodarone  to 100 mg once daily= 1/2 of the 200 mg tablet once daily  Start rosuvastatin 20 mg once daily  *If you need a refill on your cardiac medications before your next appointment, please call your pharmacy*  Lab Work:  Your physician recommends that you return for lab work in: 8 weeks-fasting  If you have labs (blood work) drawn today and your tests are completely normal, you will receive your results only by: MyChart Message (if you have MyChart) OR A paper copy in the mail If you have any lab test that is abnormal or we need to change your treatment, we will call you to review the results.  Testing/Procedures:  Your physician has requested that you have an echocardiogram. Echocardiography is a painless test that uses sound waves to create images of your heart. It provides your doctor with information about the size and shape of your heart and how well your heart's chambers and valves are working. This procedure takes approximately one hour. There are no restrictions for this procedure. Please do NOT wear cologne, perfume, aftershave, or lotions (deodorant is allowed). Please arrive 15 minutes prior to your appointment time.  Please note: We ask at that you not bring children with you during ultrasound (echo/ vascular) testing. Due to room size and safety concerns, children are not allowed in the ultrasound rooms during exams. Our front office staff cannot provide observation of children in our lobby area while testing is being conducted. An adult accompanying a patient to their appointment will only be allowed in the ultrasound room at the discretion of the ultrasound technician under special circumstances. We apologize for any inconvenience. [address]  Follow-Up: At [facility], you and your health needs are our priority.  As part of our continuing mission to provide you with exceptional heart care, our providers are all part of  one team.  This team includes your primary Cardiologist (physician) and Advanced Practice Providers or APPs (Physician Assistants and Nurse Practitioners) who all work together to provide you with the care you need, when you need it.  Your next appointment:   6 month(s)  Provider:   Alexandria Angel, MD

## 2023-09-17 NOTE — Telephone Encounter (Signed)
 Pharmacy comment: Alternative Requested:THE PRESCRIBED MEDICATION IS NOT COVERED BY INSURANCE. PLEASE CONSIDER CHANGING TO ONE OF THE SUGGESTED COVERED ALTERNATIVES.  Please address

## 2023-09-26 DIAGNOSIS — Z7901 Long term (current) use of anticoagulants: Secondary | ICD-10-CM | POA: Diagnosis not present

## 2023-09-26 DIAGNOSIS — D696 Thrombocytopenia, unspecified: Secondary | ICD-10-CM | POA: Diagnosis not present

## 2023-09-26 DIAGNOSIS — Z952 Presence of prosthetic heart valve: Secondary | ICD-10-CM | POA: Diagnosis not present

## 2023-09-27 ENCOUNTER — Other Ambulatory Visit: Payer: Self-pay | Admitting: Physician Assistant

## 2023-09-27 DIAGNOSIS — Z0000 Encounter for general adult medical examination without abnormal findings: Secondary | ICD-10-CM | POA: Diagnosis not present

## 2023-09-30 ENCOUNTER — Ambulatory Visit: Payer: Self-pay | Admitting: *Deleted

## 2023-10-07 DIAGNOSIS — H2512 Age-related nuclear cataract, left eye: Secondary | ICD-10-CM | POA: Diagnosis not present

## 2023-10-08 DIAGNOSIS — H2512 Age-related nuclear cataract, left eye: Secondary | ICD-10-CM | POA: Diagnosis not present

## 2023-10-08 DIAGNOSIS — H25011 Cortical age-related cataract, right eye: Secondary | ICD-10-CM | POA: Diagnosis not present

## 2023-10-08 DIAGNOSIS — H2511 Age-related nuclear cataract, right eye: Secondary | ICD-10-CM | POA: Diagnosis not present

## 2023-10-08 DIAGNOSIS — H25041 Posterior subcapsular polar age-related cataract, right eye: Secondary | ICD-10-CM | POA: Diagnosis not present

## 2023-10-16 ENCOUNTER — Encounter: Payer: Self-pay | Admitting: *Deleted

## 2023-10-16 ENCOUNTER — Other Ambulatory Visit: Payer: Self-pay | Admitting: *Deleted

## 2023-10-16 ENCOUNTER — Ambulatory Visit: Attending: Cardiology

## 2023-10-16 DIAGNOSIS — I4819 Other persistent atrial fibrillation: Secondary | ICD-10-CM

## 2023-10-16 NOTE — Progress Notes (Unsigned)
 Enrolled patient for a 3 day Zio XT monitor to be mailed to patients home

## 2023-10-21 DIAGNOSIS — H25041 Posterior subcapsular polar age-related cataract, right eye: Secondary | ICD-10-CM | POA: Diagnosis not present

## 2023-10-21 DIAGNOSIS — H25011 Cortical age-related cataract, right eye: Secondary | ICD-10-CM | POA: Diagnosis not present

## 2023-10-21 DIAGNOSIS — H2511 Age-related nuclear cataract, right eye: Secondary | ICD-10-CM | POA: Diagnosis not present

## 2023-10-24 ENCOUNTER — Encounter: Payer: Self-pay | Admitting: Family Medicine

## 2023-10-24 DIAGNOSIS — Z952 Presence of prosthetic heart valve: Secondary | ICD-10-CM | POA: Diagnosis not present

## 2023-10-24 DIAGNOSIS — D696 Thrombocytopenia, unspecified: Secondary | ICD-10-CM | POA: Diagnosis not present

## 2023-10-30 DIAGNOSIS — Z7901 Long term (current) use of anticoagulants: Secondary | ICD-10-CM | POA: Diagnosis not present

## 2023-11-01 ENCOUNTER — Ambulatory Visit (HOSPITAL_COMMUNITY)
Admission: RE | Admit: 2023-11-01 | Discharge: 2023-11-01 | Disposition: A | Discharge status: Home / Self Care | Source: Ambulatory Visit | Attending: Cardiology | Admitting: Cardiology

## 2023-11-01 ENCOUNTER — Ambulatory Visit (HOSPITAL_COMMUNITY)

## 2023-11-01 DIAGNOSIS — Z952 Presence of prosthetic heart valve: Secondary | ICD-10-CM | POA: Diagnosis not present

## 2023-11-01 LAB — ECHOCARDIOGRAM COMPLETE
AV Mean grad: 16.7 mmHg
AV Peak grad: 29.5 mmHg
Ao pk vel: 2.72 m/s
Area-P 1/2: 1.29 cm2
S' Lateral: 5.02 cm

## 2023-11-02 ENCOUNTER — Ambulatory Visit: Payer: Self-pay | Admitting: Cardiology

## 2023-11-05 ENCOUNTER — Ambulatory Visit (HOSPITAL_COMMUNITY)
Admission: RE | Admit: 2023-11-05 | Discharge: 2023-11-05 | Discharge status: Home / Self Care | Source: Ambulatory Visit | Attending: Cardiology | Admitting: Cardiology

## 2023-11-05 DIAGNOSIS — I2510 Atherosclerotic heart disease of native coronary artery without angina pectoris: Secondary | ICD-10-CM | POA: Insufficient documentation

## 2023-11-05 DIAGNOSIS — J9811 Atelectasis: Secondary | ICD-10-CM | POA: Diagnosis not present

## 2023-11-05 DIAGNOSIS — I700 Atherosclerosis of aorta: Secondary | ICD-10-CM | POA: Insufficient documentation

## 2023-11-05 DIAGNOSIS — I517 Cardiomegaly: Secondary | ICD-10-CM | POA: Diagnosis not present

## 2023-11-05 DIAGNOSIS — R918 Other nonspecific abnormal finding of lung field: Secondary | ICD-10-CM | POA: Diagnosis not present

## 2023-11-05 DIAGNOSIS — J984 Other disorders of lung: Secondary | ICD-10-CM | POA: Insufficient documentation

## 2023-11-05 NOTE — Progress Notes (Signed)
 [facility] [phone]   [phone]  INITIAL CONSULT NOTE  Patient Care Team: Cleotilde Planas, MD as PCP - General (Family Medicine) Pietro Redell RAMAN, MD as PCP - Cardiology (Cardiology) Georjean Darice HERO, MD as Consulting Physician (Neurology) Lucienne Ripley POUR, MD as Referring Physician (Cardiology) Harlene Riis, MD (Inactive) as Referring Physician (Cardiology) Lennard Lesta FALCON, MD as Consulting Physician (Gastroenterology)  Hematological/Oncological History Labs from PCP, Dr. Planas Cleotilde. 09/26/2023: WBC 4.5, Hgb 12.7(L), MCV 83.3, Plt 74 (L) 10/24/2023: WBC 4.8, Hgb 11.7, MCV 85.9, Plt 65 (L) 11/06/2023: Establish care with [facility]  CHIEF COMPLAINTS/PURPOSE OF CONSULTATION:  Thrombocytopenia Normocytic Anemia  HISTORY OF PRESENTING ILLNESS:  Alan Willis 78 y.o. male with medical history significant for atrial fibrillation/flutter status post cardioversions, previous aortic valve replacement, mitral valve stenosis regurgitation, colon obstruction status post sigmoid resection in 2007 seizures and cholelithiasis who presents to the hematology department for evaluation for thrombocytopenia and normocytic anemia.  He is accompanied by his wife for this visit.  On exam today, Alan Willis reports his energy levels are fairly stable.  He has some fatigue but he can complete all his ADLs on his own.  He has a good appetite without any noticeable weight changes.  He denies nausea, vomiting or bowel habit changes.  He does bruise easily due to chronic anticoagulation use.  He denies any overt signs of bleeding including hematochezia, melena or hemoptysis.  He denies fevers, chills, night sweats, shortness of breath, chest pain.  He has no other complaints.  Rest of the 10 point ROS as below.  MEDICAL HISTORY:  Past Medical History:  Diagnosis Date   A-fib [facility])    Atrial flutter with rapid ventricular response (HCC) 03/16/2021    Cholelithiasis 2011   Chronic anticoagulation 03/03/2023   Colon, diverticulosis    H/O heart valve replacement with mechanical valve 03/03/2023   Bjork-Shiley tilting disc valve in 1983 at [facility] in [place], [place] .        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   Stricture of sigmoid colon s/p colectomy 2007 2007    SURGICAL HISTORY: Past Surgical History:  Procedure Laterality Date   ATRIAL FIBRILLATION ABLATION     BIOPSY  03/05/2023   Procedure: BIOPSY;  Surgeon: Saintclair Jasper, MD;  Location: [facility];  Service: Gastroenterology;;   CARDIOVERSION N/A 03/13/2023   Procedure: CARDIOVERSION;  Surgeon: Alvan Ronal BRAVO, MD;  Location: [facility];  Service: Cardiovascular;  Laterality: N/A;   CARDIOVERSION N/A 03/13/2023   Procedure: CARDIOVERSION;  Surgeon: Alvan Ronal BRAVO, MD;  Location: [facility];  Service: Cardiovascular;  Laterality: N/A;   COLON RESECTION SIGMOID  07/26/2005   Dr Eletha for diverticular sigmoid colon stricture   ESOPHAGOGASTRODUODENOSCOPY (EGD) WITH PROPOFOL  N/A 03/05/2023   Procedure: ESOPHAGOGASTRODUODENOSCOPY (EGD) WITH PROPOFOL ;  Surgeon: Saintclair Jasper, MD;  Location: [facility];  Service: Gastroenterology;  Laterality: N/A;   HEMOSTASIS CLIP PLACEMENT  03/05/2023   Procedure: HEMOSTASIS CLIP PLACEMENT;  Surgeon: Saintclair Jasper, MD;  Location: [facility];  Service: Gastroenterology;;   LAPAROSCOPIC INGUINAL HERNIA REPAIR Bilateral 04/03/1999   Dr Fredrik   VALVE REPLACEMENT      SOCIAL HISTORY: Social History   Socioeconomic History   Marital status: Married    Spouse name: Not on file   Number of children: Not on file   Years of education: Not on file   Highest education level: Not on file  Occupational History  Not on file  Tobacco Use   Smoking status: Some Days    Types: Cigars   Smokeless tobacco: Never   Tobacco comments:    Cigars once a week use  Vaping Use   Vaping status:  Never Used  Substance and Sexual Activity   Alcohol use: Not Currently   Drug use: Never   Sexual activity: Not on file  Other Topics Concern   Not on file  Social History Narrative   Right handed    Lives with family    Retired    Drinks a etoh free beer a day    Social Drivers of Corporate investment banker Strain: Not on file  Food Insecurity: No Food Insecurity (11/06/2023)   Hunger Vital Sign    Worried About Running Out of Food in the Last Year: Never true    Ran Out of Food in the Last Year: Never true  Transportation Needs: No Transportation Needs (11/06/2023)   PRAPARE - Administrator, Civil Service (Medical): No    Lack of Transportation (Non-Medical): No  Physical Activity: Not on file  Stress: Not on file  Social Connections: Not on file  Intimate Partner Violence: Not At Risk (11/06/2023)   Humiliation, Afraid, Rape, and Kick questionnaire    Fear of Current or Ex-Partner: No    Emotionally Abused: No    Physically Abused: No    Sexually Abused: No    FAMILY HISTORY: History reviewed. No pertinent family history.  ALLERGIES:  is allergic to amoxicillin-pot clavulanate and codeine.  MEDICATIONS:  Current Outpatient Medications  Medication Sig Dispense Refill   amiodarone  (PACERONE ) 200 MG tablet Take 0.5 tablets (100 mg total) by mouth daily. 45 tablet 3   Cholecalciferol (VITAMIN D3) 1000 units CAPS Take 1,000 Units by mouth daily.     doxycycline  (VIBRA -TABS) 100 MG tablet Take 100 mg by mouth See admin instructions. Take 100 mg by mouth 30-60 minutes prior to dental procedures     empagliflozin  (JARDIANCE ) 10 MG TABS tablet Take 1 tablet (10 mg total) by mouth daily. 90 tablet 3   ferrous sulfate 325 (65 FE) MG tablet Take 325 mg by mouth daily with breakfast.     furosemide  (LASIX ) 20 MG tablet Take 20 mg by mouth daily.     IMODIUM A-D 2 MG tablet Take 2 mg by mouth 4 (four) times daily as needed for diarrhea or loose stools.      levETIRAcetam  (KEPPRA ) 500 MG tablet Take 1 tablet in AM and 1 and 1/2 tablets in PM 225 tablet 3   magnesium  oxide (MAG-OX) 400 (240 Mg) MG tablet Take 400 mg by mouth daily.     ondansetron  (ZOFRAN ) 4 MG tablet Take 4 mg by mouth every 8 (eight) hours as needed.     rosuvastatin  (CRESTOR ) 20 MG tablet Take 1 tablet (20 mg total) by mouth daily. 90 tablet 3   TYLENOL  500 MG tablet Take 500 mg by mouth every 6 (six) hours as needed for mild pain (pain score 1-3) or headache.     warfarin (COUMADIN ) 6 MG tablet Take 3-6 mg by mouth See admin instructions. Take 6 mg by mouth in the morning on Sun/Mon/Wed/Thurs/Fri/Sat and 3 mg on Tues     No current facility-administered medications for this visit.    REVIEW OF SYSTEMS:   Constitutional: ( - ) fevers, ( - )  chills , ( - ) night sweats Eyes: ( - ) blurriness of vision, ( - )  double vision, ( - ) watery eyes Ears, nose, mouth, throat, and face: ( - ) mucositis, ( - ) sore throat Respiratory: ( - ) cough, ( - ) dyspnea, ( - ) wheezes Cardiovascular: ( - ) palpitation, ( - ) chest discomfort, ( - ) lower extremity swelling Gastrointestinal:  ( - ) nausea, ( - ) heartburn, ( - ) change in bowel habits Skin: ( - ) abnormal skin rashes Lymphatics: ( - ) new lymphadenopathy, ( - ) easy bruising Neurological: ( - ) numbness, ( - ) tingling, ( - ) new weaknesses Behavioral/Psych: ( - ) mood change, ( - ) new changes  All other systems were reviewed with the patient and are negative.  PHYSICAL EXAMINATION: ECOG PERFORMANCE STATUS: 0 - Asymptomatic  Vitals:   11/06/23 0906  BP: 117/61  Pulse: (!) 56  Resp: 18  Temp: (!) 97.5 F (36.4 C)  SpO2: 96%   Filed Weights   11/06/23 0906  Weight: 167 lb 1.6 oz (75.8 kg)    GENERAL: well appearing male in NAD  SKIN: skin color, texture, turgor are normal, no rashes or significant lesions EYES: conjunctiva are pink and non-injected, sclera clear LUNGS: clear to auscultation and percussion with  normal breathing effort HEART: regular rate & rhythm and no murmurs and no lower extremity edema Musculoskeletal: no cyanosis of digits and no clubbing  PSYCH: alert & oriented x 3, fluent speech NEURO: no focal motor/sensory deficits  LABORATORY DATA:  I have reviewed the data as listed    Latest Ref Rng & Units 05/03/2023    9:39 AM 03/28/2023   10:18 AM 03/17/2023    4:20 AM  CBC  WBC 3.4 - 10.8 x10E3/uL 6.4  6.5  6.5   Hemoglobin 13.0 - 17.7 g/dL 87.2  89.8  9.0   Hematocrit 37.5 - 51.0 % 40.8  32.1  27.4   Platelets 150 - 450 x10E3/uL 159  178  226        Latest Ref Rng & Units 05/03/2023    9:39 AM 03/28/2023   10:18 AM 03/17/2023    8:11 AM  CMP  Glucose 70 - 99 mg/dL 898  90  99   BUN 8 - 27 mg/dL 12  10  15    Creatinine 0.76 - 1.27 mg/dL 8.64  8.57  8.68   Sodium 134 - 144 mmol/L 139  136  129   Potassium 3.5 - 5.2 mmol/L 3.7  3.5  3.6   Chloride 96 - 106 mmol/L 100  97  98   CO2 20 - 29 mmol/L 24  25  23    Calcium  8.6 - 10.2 mg/dL 9.3  8.9  8.6   Total Protein 6.0 - 8.5 g/dL 6.9  6.8    Total Bilirubin 0.0 - 1.2 mg/dL 0.5  0.6    Alkaline Phos 44 - 121 IU/L 86  75    AST 0 - 40 IU/L 25  17    ALT 0 - 44 IU/L 15  9      RADIOGRAPHIC STUDIES: I have personally reviewed the radiological images as listed and agreed with the findings in the report. ECHOCARDIOGRAM COMPLETE Result Date: 11/01/2023    ECHOCARDIOGRAM REPORT   Patient Name:   KRISTINA BERTONE [facility] Date of Exam: 11/01/2023 Medical Rec #:  [id number]          Height:       70.0 in Accession #:    [id number]  Weight:       164.0 lb Date of Birth:  01-31-1946          BSA:          1.918 m Patient Age:    77 years           BP:           106/72 mmHg Patient Gender: M                  HR:           56 bpm. Exam Location:  [address] Procedure: 2D Echo, 3D Echo and Strain Analysis (Both Spectral and Color Flow            Doppler were utilized during procedure). Indications:    I05.9 Mitral valve disorder   History:        Patient has prior history of Echocardiogram examinations, most                 recent 03/07/2023. CHF, Arrythmias:Atrial Fibrillation and                 Atrial Flutter; Risk Factors:Current Smoker. Aortic valve                 replacement. Mitral valve stenosis. Chronic kidney disease.                 Aortic Valve: unknown ball and cage valve is present in the                 aortic position.  Sonographer:    Jon Hacker RCS Referring Phys: [phone] BRIAN S CRENSHAW IMPRESSIONS  1. Left ventricular ejection fraction, by estimation, is 40 to 45%. The left ventricle has mildly decreased function. The left ventricle demonstrates global hypokinesis. The left ventricular internal cavity size was mildly dilated. There is mild left ventricular hypertrophy. Left ventricular diastolic function could not be evaluated. The average left ventricular global longitudinal strain is -13.6 %. The global longitudinal strain is abnormal.  2. Right ventricular systolic function is mildly reduced. The right ventricular size is mildly enlarged. There is moderately elevated pulmonary artery systolic pressure.  3. Left atrial size was severely dilated.  4. Right atrial size was severely dilated.  5. The mitral valve is degenerative. Moderate to severe mitral valve regurgitation. 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 The mean mitral valve gradient is 7.0 mmHg. Severe mitral annular calcification.  6. The aortic valve has been repaired/replaced. Aortic valve regurgitation is trivial. There is a unknown ball and cage valve present in the aortic position. Aortic valve mean gradient measures 16.7 mmHg.  7. Aortic dilatation noted. There is borderline dilatation of the ascending aorta, measuring 39 mm.  8. The inferior vena cava is dilated in size with <50% respiratory variability, suggesting right atrial pressure of 15 mmHg. Comparison(s): Changes from prior study are noted. Mitral stenosis similar to prior (moderate to severe),  mitral regurgitation worse than prior (now moderate to severe). Consider TEE for further evaluation if clinically indicated. FINDINGS  Left Ventricle: Left ventricular ejection fraction, by estimation, is 40 to 45%. The left ventricle has mildly decreased function. The left ventricle demonstrates global hypokinesis. The average left ventricular global longitudinal strain is -13.6 %. Strain was performed and the global longitudinal strain is abnormal. The left ventricular internal cavity size was mildly dilated. There is mild left ventricular hypertrophy. Left ventricular diastolic function could not be evaluated due to mitral annular calcification (moderate or  greater). Left ventricular diastolic function could not be evaluated. Right Ventricle: The right ventricular size is mildly enlarged. No increase in right ventricular wall thickness. Right ventricular systolic function is mildly reduced. There is moderately elevated pulmonary artery systolic pressure. The tricuspid regurgitant velocity is 3.35 m/s, and with an assumed right atrial pressure of 15 mmHg, the estimated right ventricular systolic pressure is 59.9 mmHg. Left Atrium: Left atrial size was severely dilated. Right Atrium: Right atrial size was severely dilated. Pericardium: There is no evidence of pericardial effusion. Mitral Valve: MVA 1.55 by PHT. The mitral valve is degenerative in appearance. There is severe thickening of the mitral valve leaflet(s). There is severe calcification of the mitral valve leaflet(s). Severe mitral annular calcification. Moderate to severe mitral valve regurgitation. 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 MV peak gradient, 23.4 mmHg. The mean mitral valve gradient is 7.0 mmHg. Tricuspid Valve: The tricuspid valve is normal in structure. Tricuspid valve regurgitation is mild . No evidence of tricuspid stenosis. Aortic Valve: The aortic valve has been repaired/replaced. Aortic valve regurgitation is trivial. Aortic valve  mean gradient measures 16.7 mmHg. Aortic valve peak gradient measures 29.5 mmHg. There is a unknown ball and cage valve present in the aortic position. Pulmonic Valve: The pulmonic valve was grossly normal. Pulmonic valve regurgitation is mild. No evidence of pulmonic stenosis. Aorta: Aortic dilatation noted. There is borderline dilatation of the ascending aorta, measuring 39 mm. Venous: The inferior vena cava is dilated in size with less than 50% respiratory variability, suggesting right atrial pressure of 15 mmHg. IAS/Shunts: The atrial septum is grossly normal. Additional Comments: 3D was performed not requiring image post processing on an independent workstation and was abnormal.  LEFT VENTRICLE PLAX 2D LVIDd:         5.95 cm Diastology LVIDs:         5.02 cm LV e' medial:    5.29 cm/s LV PW:         1.24 cm LV E/e' medial:  38.4 LV IVS:        1.52 cm LV e' lateral:   6.92 cm/s                        LV E/e' lateral: 29.3                         2D Longitudinal Strain                        2D Strain GLS (A4C):   -14.2 %                        2D Strain GLS (A3C):   -13.3 %                        2D Strain GLS (A2C):   -13.4 %                        2D Strain GLS Avg:     -13.6 %                         3D Volume EF:                        3D EF:  48 %                        LV EDV:       275 ml                        LV ESV:       142 ml                        LV SV:        133 ml RIGHT VENTRICLE RV Basal diam:  4.30 cm RV S prime:     6.41 cm/s TAPSE (M-mode): 1.6 cm RVSP:           59.9 mmHg LEFT ATRIUM              Index        RIGHT ATRIUM            Index LA diam:        5.00 cm  2.61 cm/m   RA Pressure: 15.00 mmHg LA Vol (A2C):   130.0 ml 67.76 ml/m  RA Area:     27.20 cm LA Vol (A4C):   137.0 ml 71.41 ml/m  RA Volume:   88.20 ml   45.97 ml/m LA Biplane Vol: 139.0 ml 72.45 ml/m  AORTIC VALVE AV Vmax:           271.67 cm/s AV Vmean:          186.667 cm/s AV VTI:            0.611 m AV Peak  Grad:      29.5 mmHg AV Mean Grad:      16.7 mmHg LVOT Vmax:         96.50 cm/s LVOT Vmean:        63.800 cm/s LVOT VTI:          0.217 m LVOT/AV VTI ratio: 0.35  AORTA Ao Asc diam: 3.90 cm MITRAL VALVE                TRICUSPID VALVE MV Area (PHT): 1.29 cm     TR Peak grad:   44.9 mmHg MV Peak grad:  23.4 mmHg    TR Vmax:        335.00 cm/s MV Mean grad:  7.0 mmHg     Estimated RAP:  15.00 mmHg MV Vmax:       2.42 m/s     RVSP:           59.9 mmHg MV Vmean:      115.0 cm/s MV E velocity: 203.00 cm/s  SHUNTS MV A velocity: 96.30 cm/s   Systemic VTI: 0.22 m MV E/A ratio:  2.11 Shelda Bruckner MD Electronically signed by Shelda Bruckner MD Signature Date/Time: 11/01/2023/3:24:20 PM    Final     ASSESSMENT & PLAN Alan Willis is a 78 y.o. male who presents to the clinic for evaluation for thrombocytopenia and normocytic anemia.  I reviewed potential etiologies including liver disease, splenomegaly, infectious processes, nutritional anemias, immune mediated and bone marrow disorders.  Patient will proceed with serologic workup to evaluate for underlying cause.  We will consider a bone marrow biopsy if there is concern for underlying bone marrow disorder.   #Thrombocytopenia #Normocytic anemia: --Repeat CBC w/diff, CMP,  --Evaluate for nutritional deficiencies with iron panel, B12 level, MMA level, copper  level, folate level. --Evaluate for viral  hepatitis with hep B and C serologies --Rule out pseudothrombocytopenia with platelet by citrate --Evaluate for any mediated process with immature platelet fraction --Check for paraproteinemia with SPEP/IFE, serum free light chains --Evaluate for platelet abnormality with save smear. --RTC based on above workup.    No orders of the defined types were placed in this encounter.   All questions were answered. The patient knows to call the clinic with any problems, questions or concerns.  I have spent a total of 60 minutes minutes of  face-to-face and non-face-to-face time, preparing to see the patient, obtaining and/or reviewing separately obtained history, performing a medically appropriate examination, counseling and educating the patient, ordering medications/tests/procedures, referring and communicating with other health care professionals, documenting clinical information in the electronic health record, independently interpreting results and communicating results to the patient, and care coordination.   Johnston Police, PA-C Department of Hematology/Oncology [facility] Phone: [phone]  Patient was seen with Dr. Federico   I have read the above note and personally examined the patient. I agree with the assessment and plan as noted above.  Briefly Mr. Alan Willis is a 78 year old male who presents for evaluation of normocytic anemia and thrombocytopenia.  The patient has prior abdominal imaging from November 2024 which shows no evidence of liver disease or splenomegaly.  Additionally the patient has had no difficulty with bleeding, bruising, or dark stools.  He notes he has otherwise been at his baseline level of health.  Today we will perform a full workup to include nutritional evaluation, immature platelet fraction and platelet and citrate, and multiple myeloma rule out.  We discussed with the patient that a bone marrow biopsy may be required if the blood work is unrevealing.  He voiced understanding of our findings and plan moving forward.   Norleen IVAR Federico, MD Department of Hematology/Oncology [facility] Phone: [phone] Pager: [phone] Email: [email or web address]

## 2023-11-05 NOTE — Telephone Encounter (Signed)
Pt's wife returning nurse call.

## 2023-11-06 ENCOUNTER — Inpatient Hospital Stay: Attending: Physician Assistant | Admitting: Physician Assistant

## 2023-11-06 ENCOUNTER — Encounter: Payer: Self-pay | Admitting: Physician Assistant

## 2023-11-06 ENCOUNTER — Inpatient Hospital Stay

## 2023-11-06 VITALS — BP 117/61 | HR 56 | Temp 97.5°F | Resp 18 | Ht 70.0 in | Wt 167.1 lb

## 2023-11-06 DIAGNOSIS — I4891 Unspecified atrial fibrillation: Secondary | ICD-10-CM | POA: Insufficient documentation

## 2023-11-06 DIAGNOSIS — H5711 Ocular pain, right eye: Secondary | ICD-10-CM | POA: Diagnosis not present

## 2023-11-06 DIAGNOSIS — F1290 Cannabis use, unspecified, uncomplicated: Secondary | ICD-10-CM | POA: Diagnosis not present

## 2023-11-06 DIAGNOSIS — Z7901 Long term (current) use of anticoagulants: Secondary | ICD-10-CM | POA: Diagnosis not present

## 2023-11-06 DIAGNOSIS — D696 Thrombocytopenia, unspecified: Secondary | ICD-10-CM | POA: Insufficient documentation

## 2023-11-06 DIAGNOSIS — R5383 Other fatigue: Secondary | ICD-10-CM | POA: Insufficient documentation

## 2023-11-06 DIAGNOSIS — S0501XA Injury of conjunctiva and corneal abrasion without foreign body, right eye, initial encounter: Secondary | ICD-10-CM | POA: Diagnosis not present

## 2023-11-06 DIAGNOSIS — D649 Anemia, unspecified: Secondary | ICD-10-CM | POA: Diagnosis not present

## 2023-11-06 DIAGNOSIS — F17290 Nicotine dependence, other tobacco product, uncomplicated: Secondary | ICD-10-CM | POA: Insufficient documentation

## 2023-11-06 LAB — CBC WITH DIFFERENTIAL (CANCER CENTER ONLY)
Abs Immature Granulocytes: 0.02 K/uL (ref 0.00–0.07)
Basophils Absolute: 0.1 K/uL (ref 0.0–0.1)
Basophils Relative: 1 %
Eosinophils Absolute: 0.5 K/uL (ref 0.0–0.5)
Eosinophils Relative: 10 %
HCT: 38.8 % — ABNORMAL LOW (ref 39.0–52.0)
Hemoglobin: 12.7 g/dL — ABNORMAL LOW (ref 13.0–17.0)
Immature Granulocytes: 0 %
Lymphocytes Relative: 15 %
Lymphs Abs: 0.7 K/uL (ref 0.7–4.0)
MCH: 28.9 pg (ref 26.0–34.0)
MCHC: 32.7 g/dL (ref 30.0–36.0)
MCV: 88.4 fL (ref 80.0–100.0)
Monocytes Absolute: 0.6 K/uL (ref 0.1–1.0)
Monocytes Relative: 13 %
Neutro Abs: 2.8 K/uL (ref 1.7–7.7)
Neutrophils Relative %: 61 %
Platelet Count: 72 K/uL — ABNORMAL LOW (ref 150–400)
RBC: 4.39 MIL/uL (ref 4.22–5.81)
RDW: 17.2 % — ABNORMAL HIGH (ref 11.5–15.5)
WBC Count: 4.6 K/uL (ref 4.0–10.5)
nRBC: 0 % (ref 0.0–0.2)

## 2023-11-06 LAB — CMP (CANCER CENTER ONLY)
ALT: 19 U/L (ref 0–44)
AST: 25 U/L (ref 15–41)
Albumin: 4.3 g/dL (ref 3.5–5.0)
Alkaline Phosphatase: 159 U/L — ABNORMAL HIGH (ref 38–126)
Anion gap: 6 (ref 5–15)
BUN: 12 mg/dL (ref 8–23)
CO2: 28 mmol/L (ref 22–32)
Calcium: 9.3 mg/dL (ref 8.9–10.3)
Chloride: 99 mmol/L (ref 98–111)
Creatinine: 1.41 mg/dL — ABNORMAL HIGH (ref 0.61–1.24)
GFR, Estimated: 52 mL/min — ABNORMAL LOW (ref >60)
Glucose, Bld: 90 mg/dL (ref 70–99)
Potassium: 4.4 mmol/L (ref 3.5–5.1)
Sodium: 133 mmol/L — ABNORMAL LOW (ref 135–145)
Total Bilirubin: 1.5 mg/dL — ABNORMAL HIGH (ref 0.0–1.2)
Total Protein: 7.7 g/dL (ref 6.5–8.1)

## 2023-11-06 LAB — HEPATITIS B SURFACE ANTIGEN: Hepatitis B Surface Ag: NONREACTIVE

## 2023-11-06 LAB — TECHNOLOGIST SMEAR REVIEW

## 2023-11-06 LAB — HEPATITIS B SURFACE ANTIBODY,QUALITATIVE: Hep B S Ab: NONREACTIVE

## 2023-11-06 LAB — FERRITIN: Ferritin: 105 ng/mL (ref 24–336)

## 2023-11-06 LAB — VITAMIN B12: Vitamin B-12: 538 pg/mL (ref 180–914)

## 2023-11-06 LAB — IRON AND IRON BINDING CAPACITY (CC-WL,HP ONLY)
Iron: 58 ug/dL (ref 45–182)
Saturation Ratios: 12 % — ABNORMAL LOW (ref 17.9–39.5)
TIBC: 477 ug/dL — ABNORMAL HIGH (ref 250–450)
UIBC: 419 ug/dL — ABNORMAL HIGH (ref 117–376)

## 2023-11-06 LAB — HEPATITIS C ANTIBODY: HCV Ab: NONREACTIVE

## 2023-11-06 LAB — FOLATE: Folate: 15.7 ng/mL (ref >5.9)

## 2023-11-06 LAB — IMMATURE PLATELET FRACTION: Immature Platelet Fraction: 5.9 % (ref 1.2–8.6)

## 2023-11-06 LAB — WBC/PLT IN CITRATE

## 2023-11-06 NOTE — Telephone Encounter (Signed)
 Wife Gennie) returned RN's call regarding results.

## 2023-11-07 LAB — KAPPA/LAMBDA LIGHT CHAINS
Kappa free light chain: 46.6 mg/L — ABNORMAL HIGH (ref 3.3–19.4)
Kappa, lambda light chain ratio: 1.41 (ref 0.26–1.65)
Lambda free light chains: 33 mg/L — ABNORMAL HIGH (ref 5.7–26.3)

## 2023-11-07 LAB — COPPER, SERUM: Copper: 124 ug/dL (ref 69–132)

## 2023-11-07 LAB — HEPATITIS B CORE ANTIBODY, TOTAL: HEP B CORE AB: NEGATIVE

## 2023-11-08 DIAGNOSIS — I4819 Other persistent atrial fibrillation: Secondary | ICD-10-CM | POA: Diagnosis not present

## 2023-11-08 LAB — MULTIPLE MYELOMA PANEL, SERUM
Albumin SerPl Elph-Mcnc: 4 g/dL (ref 2.9–4.4)
Albumin/Glob SerPl: 1.4 (ref 0.7–1.7)
Alpha 1: 0.2 g/dL (ref 0.0–0.4)
Alpha2 Glob SerPl Elph-Mcnc: 0.6 g/dL (ref 0.4–1.0)
B-Globulin SerPl Elph-Mcnc: 1.1 g/dL (ref 0.7–1.3)
Gamma Glob SerPl Elph-Mcnc: 1.2 g/dL (ref 0.4–1.8)
Globulin, Total: 3 g/dL (ref 2.2–3.9)
IgA: 464 mg/dL — ABNORMAL HIGH (ref 61–437)
IgG (Immunoglobin G), Serum: 1193 mg/dL (ref 603–1613)
IgM (Immunoglobulin M), Srm: 218 mg/dL — ABNORMAL HIGH (ref 15–143)
Total Protein ELP: 7 g/dL (ref 6.0–8.5)

## 2023-11-09 LAB — METHYLMALONIC ACID, SERUM: Methylmalonic Acid, Quantitative: 252 nmol/L (ref 0–378)

## 2023-11-11 ENCOUNTER — Telehealth: Payer: Self-pay | Admitting: Physician Assistant

## 2023-11-11 DIAGNOSIS — D696 Thrombocytopenia, unspecified: Secondary | ICD-10-CM

## 2023-11-11 DIAGNOSIS — D649 Anemia, unspecified: Secondary | ICD-10-CM

## 2023-11-11 NOTE — Progress Notes (Unsigned)
 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  Patient admitted with GI bleed November 2024 due to Mallory-Weiss tear which was treated with hemostatic clip.  CTA November 2024 Calcium  score 588.  Echocardiogram November 2024 showed ejection fraction 40 to 45%, severe biatrial enlargement, moderate to severe mitral stenosis, mild to moderate mitral regurgitation, prior mechanical aortic valve.  Decreased ejection fraction felt possibly secondary to tachycardia.  Patient underwent cardioversion at that time but atrial flutter recurred and he was felt to be a failure of tikosyn .  He was therefore initiated on amiodarone . Follow-up monitor showed persistent atrial flutter and rate control was pursued as he was asymptomatic.  Monitor July 2025 showed.  Echocardiogram July 2025 showed ejection fraction 40 to 45%, mild left ventricular enlargement, mild left ventricular hypertrophy, mild RV dysfunction, severe biatrial enlargement, moderate to severe mitral regurgitation, moderate to severe mitral stenosis with mean gradient 7 mmHg, status post aortic valve replacement with trace aortic insufficiency and mean gradient 16.7 mmHg.  Since he was last seen   Current Outpatient Medications  Medication Sig Dispense Refill   amiodarone  (PACERONE ) 200 MG tablet Take 0.5 tablets (100 mg total) by mouth daily. 45 tablet 3   Cholecalciferol (VITAMIN D3) 1000 units CAPS Take 1,000 Units by mouth daily.     doxycycline  (VIBRA -TABS) 100 MG tablet Take 100 mg by mouth See admin instructions. Take 100 mg by mouth 30-60 minutes prior to dental procedures     empagliflozin  (JARDIANCE ) 10 MG TABS tablet Take 1 tablet (10 mg total) by mouth daily. 90 tablet 3   ferrous sulfate 325 (65 FE) MG tablet Take 325 mg by mouth daily with breakfast.     furosemide  (LASIX ) 20 MG tablet Take 20 mg by  mouth daily.     IMODIUM A-D 2 MG tablet Take 2 mg by mouth 4 (four) times daily as needed for diarrhea or loose stools.     levETIRAcetam  (KEPPRA ) 500 MG tablet Take 1 tablet in AM and 1 and 1/2 tablets in PM 225 tablet 3   magnesium  oxide (MAG-OX) 400 (240 Mg) MG tablet Take 400 mg by mouth daily.     ondansetron  (ZOFRAN ) 4 MG tablet Take 4 mg by mouth every 8 (eight) hours as needed.     rosuvastatin  (CRESTOR ) 20 MG tablet Take 1 tablet (20 mg total) by mouth daily. 90 tablet 3   TYLENOL  500 MG tablet Take 500 mg by mouth every 6 (six) hours as needed for mild pain (pain score 1-3) or headache.     warfarin (COUMADIN ) 6 MG tablet Take 3-6 mg by mouth See admin instructions. Take 6 mg by mouth in the morning on Sun/Mon/Wed/Thurs/Fri/Sat and 3 mg on Tues     No current facility-administered medications for this visit.     Past Medical History:  Diagnosis Date   A-fib [facility])    Atrial flutter with rapid ventricular response (HCC) 03/16/2021   Cholelithiasis 2011   Chronic anticoagulation 03/03/2023   Colon, diverticulosis    H/O heart valve replacement with mechanical valve 03/03/2023   Bjork-Shiley tilting disc valve in 1983 at [facility] in [place], [place] .        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   Stricture of sigmoid colon s/p colectomy 2007 2007    Past Surgical History:  Procedure Laterality Date   ATRIAL FIBRILLATION ABLATION  BIOPSY  03/05/2023   Procedure: BIOPSY;  Surgeon: Saintclair Jasper, MD;  Location: [facility];  Service: Gastroenterology;;   CARDIOVERSION N/A 03/13/2023   Procedure: CARDIOVERSION;  Surgeon: Alvan Ronal BRAVO, MD;  Location: [facility] INVASIVE CV LAB;  Service: Cardiovascular;  Laterality: N/A;   CARDIOVERSION N/A 03/13/2023   Procedure: CARDIOVERSION;  Surgeon: Alvan Ronal BRAVO, MD;  Location: [facility] INVASIVE CV LAB;  Service: Cardiovascular;  Laterality: N/A;   COLON RESECTION SIGMOID  07/26/2005   Dr Eletha for  diverticular sigmoid colon stricture   ESOPHAGOGASTRODUODENOSCOPY (EGD) WITH PROPOFOL  N/A 03/05/2023   Procedure: ESOPHAGOGASTRODUODENOSCOPY (EGD) WITH PROPOFOL ;  Surgeon: Saintclair Jasper, MD;  Location: [facility];  Service: Gastroenterology;  Laterality: N/A;   HEMOSTASIS CLIP PLACEMENT  03/05/2023   Procedure: HEMOSTASIS CLIP PLACEMENT;  Surgeon: Saintclair Jasper, MD;  Location: [facility];  Service: Gastroenterology;;   LAPAROSCOPIC INGUINAL HERNIA REPAIR Bilateral 04/03/1999   Dr Fredrik   VALVE REPLACEMENT      Social History   Socioeconomic History   Marital status: Married    Spouse name: Not on file   Number of children: Not on file   Years of education: Not on file   Highest education level: Not on file  Occupational History   Not on file  Tobacco Use   Smoking status: Some Days    Types: Cigars   Smokeless tobacco: Never   Tobacco comments:    Cigars once a week use  Vaping Use   Vaping status: Never Used  Substance and Sexual Activity   Alcohol use: Not Currently   Drug use: Never   Sexual activity: Not on file  Other Topics Concern   Not on file  Social History Narrative   Right handed    Lives with family    Retired    Drinks a etoh free beer a day    Social Drivers of Corporate investment banker Strain: Not on file  Food Insecurity: No Food Insecurity (11/06/2023)   Hunger Vital Sign    Worried About Running Out of Food in the Last Year: Never true    Ran Out of Food in the Last Year: Never true  Transportation Needs: No Transportation Needs (11/06/2023)   PRAPARE - Administrator, Civil Service (Medical): No    Lack of Transportation (Non-Medical): No  Physical Activity: Not on file  Stress: Not on file  Social Connections: Not on file  Intimate Partner Violence: Not At Risk (11/06/2023)   Humiliation, Afraid, Rape, and Kick questionnaire    Fear of Current or Ex-Partner: No    Emotionally Abused: No    Physically Abused: No     Sexually Abused: No    No family history on file.  ROS: no fevers or chills, productive cough, hemoptysis, dysphasia, odynophagia, melena, hematochezia, dysuria, hematuria, rash, seizure activity, orthopnea, PND, pedal edema, claudication. Remaining systems are negative.  Physical Exam: Well-developed well-nourished in no acute distress.  Skin is warm and dry.  HEENT is normal.  Neck is supple.  Chest is clear to auscultation with normal expansion.  Cardiovascular exam is regular rate and rhythm.  Abdominal exam nontender or distended. No masses palpated. Extremities show no edema. neuro grossly intact  ECG- personally reviewed  A/P  1 persistent atrial fibrillation/flutter-previously cardioverted on Tikosyn  but had recurrence of atrial arrhythmias.  He is now on amiodarone  for rate control as he had some degree of dizziness in the past due to low blood pressure.  Previously noted to  have bradycardia and amiodarone  decreased to 100 mg daily.  Follow-up monitor showed .  Continue Coumadin .  2 status post aortic valve replacement-continue SBE prophylaxis and Coumadin  with goal INR 2-3.  3 mitral stenosis/mitral regurgitation-  4 cardiomyopathy-LV function mildly reduced on most recent echocardiogram.  5 coronary calcification-continue Crestor .  6 thrombocytopenia-follow-up primary care.  Redell Shallow, MD

## 2023-11-11 NOTE — Telephone Encounter (Signed)
 I spoke to Mr. Panning wife regarding the lab results form 11/06/2023. Findings show persistent thrombocytopenia which is overall stable from June 2025. There is mild anemia as well. The remaining workup shows mild iron deficiency so recommend to continue PO iron therapy. Remaining workup was negative so recommend a bone marrow biopsy to further evaluate. Dr. Federico will see patient back in clinic a few days after biopsy to review results.   I confirmed with IR that patient doesn't need to hold her coumadin .

## 2023-11-12 DIAGNOSIS — I4819 Other persistent atrial fibrillation: Secondary | ICD-10-CM

## 2023-11-13 ENCOUNTER — Ambulatory Visit: Payer: Self-pay | Admitting: *Deleted

## 2023-11-13 DIAGNOSIS — Z7901 Long term (current) use of anticoagulants: Secondary | ICD-10-CM | POA: Diagnosis not present

## 2023-11-13 DIAGNOSIS — Z952 Presence of prosthetic heart valve: Secondary | ICD-10-CM | POA: Diagnosis not present

## 2023-11-14 ENCOUNTER — Encounter: Payer: Self-pay | Admitting: Cardiology

## 2023-11-14 ENCOUNTER — Ambulatory Visit: Attending: Cardiology | Admitting: Cardiology

## 2023-11-14 VITALS — BP 124/68 | HR 64 | Ht 69.0 in | Wt 164.8 lb

## 2023-11-14 DIAGNOSIS — Z952 Presence of prosthetic heart valve: Secondary | ICD-10-CM

## 2023-11-14 DIAGNOSIS — E785 Hyperlipidemia, unspecified: Secondary | ICD-10-CM

## 2023-11-14 DIAGNOSIS — I4819 Other persistent atrial fibrillation: Secondary | ICD-10-CM | POA: Diagnosis not present

## 2023-11-14 DIAGNOSIS — I050 Rheumatic mitral stenosis: Secondary | ICD-10-CM

## 2023-11-14 NOTE — Patient Instructions (Addendum)
 Medication Instructions:  Continue same medications *If you need a refill on your cardiac medications before your next appointment, please call your pharmacy*  Lab Work: None ordered  Testing/Procedures: None ordered  Follow-Up: At [facility], you and your health needs are our priority.  As part of our continuing mission to provide you with exceptional heart care, our providers are all part of one team.  This team includes your primary Cardiologist (physician) and Advanced Practice Providers or APPs (Physician Assistants and Nurse Practitioners) who all work together to provide you with the care you need, when you need it.  Your next appointment:  3 months    Thursday  10/16 at 8:40 am    Provider:  Unice   We recommend signing up for the patient portal called MyChart.  Sign up information is provided on this After Visit Summary.  MyChart is used to connect with patients for Virtual Visits (Telemedicine).  Patients are able to view lab/test results, encounter notes, upcoming appointments, etc.  Non-urgent messages can be sent to your provider as well.   To learn more about what you can do with MyChart, go to [email or web address].

## 2023-12-06 ENCOUNTER — Other Ambulatory Visit: Payer: Self-pay | Admitting: Radiology

## 2023-12-06 DIAGNOSIS — D696 Thrombocytopenia, unspecified: Secondary | ICD-10-CM

## 2023-12-06 NOTE — H&P (Signed)
 Chief Complaint: Persistent thrombocytopenia/normocytic anemia of uncertain etiology; referred for image guided bone marrow biopsy for further evaluation  Referring Provider(s): Dorsey,J  Supervising Physician: Luverne Aran  Patient Status: [facility] - Out-pt  History of Present Illness: Alan Willis is a 78 y.o. male with past medical history significant for atrial fibrillation/flutter with prior cardioversions, previous aortic valve replacement, mitral valve stenosis/regurgitation, diverticulosis, colon stricture status post sigmoid resection in 2007, seizures, cholelithiasis who presents now with persistent thrombocytopenia/normocytic anemia of uncertain etiology.  He is scheduled today for image guided bone marrow biopsy for further evaluation.   Patient is Full Code  Past Medical History:  Diagnosis Date   A-fib [facility])    Atrial flutter with rapid ventricular response (HCC) 03/16/2021   Cholelithiasis 2011   Chronic anticoagulation 03/03/2023   Colon, diverticulosis    H/O heart valve replacement with mechanical valve 03/03/2023   Bjork-Shiley tilting disc valve in 1983 at [facility] in [place], [place] .        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   Stricture of sigmoid colon s/p colectomy 2007 2007    Past Surgical History:  Procedure Laterality Date   ATRIAL FIBRILLATION ABLATION     BIOPSY  03/05/2023   Procedure: BIOPSY;  Surgeon: Saintclair Jasper, MD;  Location: [facility];  Service: Gastroenterology;;   CARDIOVERSION N/A 03/13/2023   Procedure: CARDIOVERSION;  Surgeon: Alvan Ronal BRAVO, MD;  Location: [facility] INVASIVE CV LAB;  Service: Cardiovascular;  Laterality: N/A;   CARDIOVERSION N/A 03/13/2023   Procedure: CARDIOVERSION;  Surgeon: Alvan Ronal BRAVO, MD;  Location: [facility] INVASIVE CV LAB;  Service: Cardiovascular;  Laterality: N/A;   COLON RESECTION SIGMOID  07/26/2005   Dr Eletha for diverticular sigmoid colon stricture    ESOPHAGOGASTRODUODENOSCOPY (EGD) WITH PROPOFOL  N/A 03/05/2023   Procedure: ESOPHAGOGASTRODUODENOSCOPY (EGD) WITH PROPOFOL ;  Surgeon: Saintclair Jasper, MD;  Location: [facility];  Service: Gastroenterology;  Laterality: N/A;   HEMOSTASIS CLIP PLACEMENT  03/05/2023   Procedure: HEMOSTASIS CLIP PLACEMENT;  Surgeon: Saintclair Jasper, MD;  Location: [facility];  Service: Gastroenterology;;   LAPAROSCOPIC INGUINAL HERNIA REPAIR Bilateral 04/03/1999   Dr Fredrik   VALVE REPLACEMENT      Allergies: Amoxicillin-pot clavulanate and Codeine  Medications: Prior to Admission medications   Medication Sig Start Date End Date Taking? Authorizing Provider  Cholecalciferol (VITAMIN D3) 1000 units CAPS Take 1,000 Units by mouth daily.    [provider]  doxycycline  (VIBRA -TABS) 100 MG tablet Take 100 mg by mouth See admin instructions. Take 100 mg by mouth 30-60 minutes prior to dental procedures 05/25/22   [provider]  empagliflozin  (JARDIANCE ) 10 MG TABS tablet Take 1 tablet (10 mg total) by mouth daily. 05/13/23   Pietro Redell RAMAN, MD  ferrous sulfate 325 (65 FE) MG tablet Take 325 mg by mouth daily with breakfast.    [provider]  furosemide  (LASIX ) 20 MG tablet Take 20 mg by mouth daily. 03/20/23   [provider]  IMODIUM A-D 2 MG tablet Take 2 mg by mouth 4 (four) times daily as needed for diarrhea or loose stools. Patient not taking: Reported on 11/14/2023    [provider]  levETIRAcetam  (KEPPRA ) 500 MG tablet Take 1 tablet in AM and 1 and 1/2 tablets in PM 07/09/23   Georjean Darice CHRISTELLA, MD  magnesium  oxide (MAG-OX) 400 (240 Mg) MG tablet Take 400 mg by mouth daily.    [provider]  ondansetron  (ZOFRAN ) 4 MG tablet  Take 4 mg by mouth every 8 (eight) hours as needed. 04/12/23   [provider]  rosuvastatin  (CRESTOR ) 20 MG tablet Take 1 tablet (20 mg total) by mouth daily. 09/17/23   Pietro Redell RAMAN, MD  TYLENOL  500 MG tablet Take 500 mg  by mouth every 6 (six) hours as needed for mild pain (pain score 1-3) or headache.    [provider]  warfarin (COUMADIN ) 6 MG tablet Take 3-6 mg by mouth See admin instructions. Take 6 mg by mouth in the morning on Sun/Mon/Wed/Thurs/Fri/Sat and 3 mg on Tues    [provider]     No family history on file.  Social History   Socioeconomic History   Marital status: Married    Spouse name: Not on file   Number of children: Not on file   Years of education: Not on file   Highest education level: Not on file  Occupational History   Not on file  Tobacco Use   Smoking status: Some Days    Types: Cigars   Smokeless tobacco: Never   Tobacco comments:    Cigars once a week use  Vaping Use   Vaping status: Never Used  Substance and Sexual Activity   Alcohol use: Not Currently   Drug use: Never   Sexual activity: Not on file  Other Topics Concern   Not on file  Social History Narrative   Right handed    Lives with family    Retired    Drinks a etoh free beer a day    Social Drivers of Corporate investment banker Strain: Not on file  Food Insecurity: No Food Insecurity (11/06/2023)   Hunger Vital Sign    Worried About Running Out of Food in the Last Year: Never true    Ran Out of Food in the Last Year: Never true  Transportation Needs: No Transportation Needs (11/06/2023)   PRAPARE - Administrator, Civil Service (Medical): No    Lack of Transportation (Non-Medical): No  Physical Activity: Not on file  Stress: Not on file  Social Connections: Not on file       Review of Systems: denies fever,HA,CP,dyspnea, cough, abd/back pain,N/V or bleeding  Vital Signs: Vitals:   12/09/23 0743  BP: 123/64  Pulse: (!) 50  Resp: 16  Temp: 97.6 F (36.4 C)  SpO2: 97%      Advance Care Plan: No documents on file    Physical Exam: awake/alert; chest- CTA bilat; heart- bradycardic rate, nl rhythm,+murmur/click; abd-soft,+BS,NT; no LE  edema  Imaging: LONG TERM MONITOR (3-14 DAYS) Result Date: 11/12/2023 Patch Wear Time:  3 days and 19 hours (2025-07-14T10:02:09-398 to 2025-07-18T05:53:16-0400) Patient had a min HR of 42 bpm, max HR of 92 bpm, and avg HR of 53 bpm. Predominant underlying rhythm was Sinus Rhythm. 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 Junctional Rhythm was present. Isolated SVEs were occasional (2.1%, 6127), SVE Couplets were rare (<1.0%, 54), and SVE Triplets were rare (<1.0%, 1). Isolated VEs were rare (<1.0%), VE Couplets were rare (<1.0%), and no VE Triplets were present. Ventricular Trigeminy was present. Sinus bradycardia, normal sinus rhythm, transient junctional rhythm, PACs, PVCs and rare couplet. Redell Pietro, MD    Labs:  CBC: Recent Labs    03/17/23 0420 03/28/23 1018 05/03/23 0939 11/06/23 1004  WBC 6.5 6.5 6.4 4.6  HGB 9.0* 10.1* 12.7* 12.7*  HCT 27.4* 32.1* 40.8 38.8*  PLT 226 178 159 72*  COAGS: Recent Labs    03/08/23 1050 03/09/23 0310 03/14/23 0311 03/15/23 0435 03/16/23 0502 03/17/23 0420  INR  --    < > 1.7* 1.6* 1.7* 2.0*  APTT 177*  --   --   --   --   --    < > = values in this interval not displayed.    BMP: Recent Labs    03/14/23 0311 03/16/23 0502 03/17/23 0811 03/28/23 1018 05/03/23 0939 11/06/23 1004  NA 135 133* 129* 136 139 133*  K 3.9 3.6 3.6 3.5 3.7 4.4  CL 100 99 98 97 100 99  CO2 24 24 23 25 24 28   GLUCOSE 96 88 99 90 101* 90  BUN 18 16 15 10 12 12   CALCIUM  8.7* 9.0 8.6* 8.9 9.3 9.3  CREATININE 1.29* 1.39* 1.31* 1.42* 1.35* 1.41*  GFRNONAA 57* 53* 56*  --   --  52*    LIVER FUNCTION TESTS: Recent Labs    03/04/23 0242 03/28/23 1018 05/03/23 0939 11/06/23 1004  BILITOT 2.3* 0.6 0.5 1.5*  AST 26 17 25 25   ALT 13 9 15 19   ALKPHOS 30* 75 86 159*  PROT 5.6* 6.8 6.9 7.7  ALBUMIN  3.6 4.4 4.3 4.3    TUMOR MARKERS: No results for input(s): AFPTM, CEA, CA199, CHROMGRNA in the last 8760  hours.  Assessment and Plan: 78 y.o. male with past medical history significant for atrial fibrillation/flutter with prior cardioversions, previous aortic valve replacement, mitral valve stenosis/regurgitation, diverticulosis, colon stricture status post sigmoid resection in 2007, seizures, cholelithiasis who presents now with persistent thrombocytopenia/normocytic anemia of uncertain etiology.  He is scheduled today for image guided bone marrow biopsy for further evaluation.Risks and benefits of procedure was discussed with the patient and/or including, but not limited to bleeding, infection, damage to adjacent structures or low yield requiring additional tests.  All of the questions were answered and there is agreement to proceed.  Consent signed and in chart.    Thank you for allowing our service to participate in [facility] 's care.  Electronically Signed: D. Franky Rakers, PA-C   12/06/2023, 2:16 PM      I spent a total of  15 minutes   in face to face in clinical consultation, greater than 50% of which was counseling/coordinating care for image guided bone marrow biopsy

## 2023-12-09 ENCOUNTER — Encounter (HOSPITAL_COMMUNITY): Payer: Self-pay

## 2023-12-09 ENCOUNTER — Ambulatory Visit (HOSPITAL_COMMUNITY)
Admission: RE | Admit: 2023-12-09 | Discharge: 2023-12-09 | Disposition: A | Discharge status: Home / Self Care | Source: Ambulatory Visit | Attending: Physician Assistant | Admitting: Physician Assistant

## 2023-12-09 ENCOUNTER — Other Ambulatory Visit: Payer: Self-pay

## 2023-12-09 DIAGNOSIS — Z1379 Encounter for other screening for genetic and chromosomal anomalies: Secondary | ICD-10-CM | POA: Insufficient documentation

## 2023-12-09 DIAGNOSIS — D696 Thrombocytopenia, unspecified: Secondary | ICD-10-CM

## 2023-12-09 DIAGNOSIS — Z952 Presence of prosthetic heart valve: Secondary | ICD-10-CM | POA: Insufficient documentation

## 2023-12-09 DIAGNOSIS — D6489 Other specified anemias: Secondary | ICD-10-CM | POA: Diagnosis not present

## 2023-12-09 DIAGNOSIS — D649 Anemia, unspecified: Secondary | ICD-10-CM | POA: Insufficient documentation

## 2023-12-09 LAB — CBC WITH DIFFERENTIAL/PLATELET
Abs Immature Granulocytes: 0.01 K/uL (ref 0.00–0.07)
Basophils Absolute: 0 K/uL (ref 0.0–0.1)
Basophils Relative: 1 %
Eosinophils Absolute: 0.6 K/uL — ABNORMAL HIGH (ref 0.0–0.5)
Eosinophils Relative: 13 %
HCT: 38.2 % — ABNORMAL LOW (ref 39.0–52.0)
Hemoglobin: 12 g/dL — ABNORMAL LOW (ref 13.0–17.0)
Immature Granulocytes: 0 %
Lymphocytes Relative: 16 %
Lymphs Abs: 0.8 K/uL (ref 0.7–4.0)
MCH: 29.2 pg (ref 26.0–34.0)
MCHC: 31.4 g/dL (ref 30.0–36.0)
MCV: 92.9 fL (ref 80.0–100.0)
Monocytes Absolute: 0.6 K/uL (ref 0.1–1.0)
Monocytes Relative: 14 %
Neutro Abs: 2.6 K/uL (ref 1.7–7.7)
Neutrophils Relative %: 56 %
Platelets: 65 K/uL — ABNORMAL LOW (ref 150–400)
RBC: 4.11 MIL/uL — ABNORMAL LOW (ref 4.22–5.81)
RDW: 15.8 % — ABNORMAL HIGH (ref 11.5–15.5)
Smear Review: NORMAL
WBC: 4.7 K/uL (ref 4.0–10.5)
nRBC: 0 % (ref 0.0–0.2)

## 2023-12-09 MED ORDER — MIDAZOLAM HCL 2 MG/2ML IJ SOLN
INTRAMUSCULAR | Status: AC | PRN
  Administered 2023-12-09: 1 mg via INTRAVENOUS

## 2023-12-09 MED ORDER — SODIUM CHLORIDE 0.9 % IV SOLN: INTRAVENOUS | Status: DC

## 2023-12-09 MED ORDER — FENTANYL CITRATE (PF) 100 MCG/2ML IJ SOLN
INTRAMUSCULAR | Status: AC
  Filled 2023-12-09: qty 2

## 2023-12-09 MED ORDER — FENTANYL CITRATE (PF) 100 MCG/2ML IJ SOLN
INTRAMUSCULAR | Status: AC | PRN
  Administered 2023-12-09 (×2): 25 ug via INTRAVENOUS

## 2023-12-09 MED ORDER — NALOXONE HCL 0.4 MG/ML IJ SOLN
INTRAMUSCULAR | Status: AC
  Filled 2023-12-09: qty 1

## 2023-12-09 MED ORDER — FLUMAZENIL 0.5 MG/5ML IV SOLN
INTRAVENOUS | Status: AC
  Filled 2023-12-09: qty 5

## 2023-12-09 MED ORDER — MIDAZOLAM HCL 2 MG/2ML IJ SOLN
INTRAMUSCULAR | Status: AC
  Filled 2023-12-09: qty 2

## 2023-12-09 NOTE — Procedures (Signed)
 Interventional Radiology Procedure Note  Procedure: CT guided bone marrow aspiration and biopsy  Complications: None  EBL: < 10 mL  Findings: Aspirate and core biopsy performed of bone marrow in right iliac bone.  Plan: Bedrest supine x 1 hrs  Ericha Whittingham T. Fredia Sorrow, M.D Pager:  [phone]

## 2023-12-11 DIAGNOSIS — Z7901 Long term (current) use of anticoagulants: Secondary | ICD-10-CM | POA: Diagnosis not present

## 2023-12-13 ENCOUNTER — Inpatient Hospital Stay: Attending: Physician Assistant

## 2023-12-13 ENCOUNTER — Other Ambulatory Visit: Payer: Self-pay | Admitting: Hematology and Oncology

## 2023-12-13 ENCOUNTER — Inpatient Hospital Stay: Admitting: Hematology and Oncology

## 2023-12-13 VITALS — BP 121/78 | HR 47 | Temp 98.0°F | Resp 13 | Wt 167.9 lb

## 2023-12-13 DIAGNOSIS — D649 Anemia, unspecified: Secondary | ICD-10-CM

## 2023-12-13 DIAGNOSIS — R040 Epistaxis: Secondary | ICD-10-CM | POA: Insufficient documentation

## 2023-12-13 DIAGNOSIS — F17290 Nicotine dependence, other tobacco product, uncomplicated: Secondary | ICD-10-CM | POA: Diagnosis not present

## 2023-12-13 DIAGNOSIS — D696 Thrombocytopenia, unspecified: Secondary | ICD-10-CM | POA: Insufficient documentation

## 2023-12-13 LAB — CMP (CANCER CENTER ONLY)
ALT: 17 U/L (ref 0–44)
AST: 23 U/L (ref 15–41)
Albumin: 4.1 g/dL (ref 3.5–5.0)
Alkaline Phosphatase: 153 U/L — ABNORMAL HIGH (ref 38–126)
Anion gap: 5 (ref 5–15)
BUN: 14 mg/dL (ref 8–23)
CO2: 29 mmol/L (ref 22–32)
Calcium: 9.3 mg/dL (ref 8.9–10.3)
Chloride: 100 mmol/L (ref 98–111)
Creatinine: 1.49 mg/dL — ABNORMAL HIGH (ref 0.61–1.24)
GFR, Estimated: 48 mL/min — ABNORMAL LOW (ref >60)
Glucose, Bld: 91 mg/dL (ref 70–99)
Potassium: 4.4 mmol/L (ref 3.5–5.1)
Sodium: 134 mmol/L — ABNORMAL LOW (ref 135–145)
Total Bilirubin: 1.6 mg/dL — ABNORMAL HIGH (ref 0.0–1.2)
Total Protein: 7.4 g/dL (ref 6.5–8.1)

## 2023-12-13 LAB — CBC WITH DIFFERENTIAL (CANCER CENTER ONLY)
Abs Immature Granulocytes: 0.01 K/uL (ref 0.00–0.07)
Basophils Absolute: 0 K/uL (ref 0.0–0.1)
Basophils Relative: 1 %
Eosinophils Absolute: 0.5 K/uL (ref 0.0–0.5)
Eosinophils Relative: 10 %
HCT: 38.7 % — ABNORMAL LOW (ref 39.0–52.0)
Hemoglobin: 12.5 g/dL — ABNORMAL LOW (ref 13.0–17.0)
Immature Granulocytes: 0 %
Lymphocytes Relative: 16 %
Lymphs Abs: 0.8 K/uL (ref 0.7–4.0)
MCH: 29.3 pg (ref 26.0–34.0)
MCHC: 32.3 g/dL (ref 30.0–36.0)
MCV: 90.8 fL (ref 80.0–100.0)
Monocytes Absolute: 0.6 K/uL (ref 0.1–1.0)
Monocytes Relative: 13 %
Neutro Abs: 2.9 K/uL (ref 1.7–7.7)
Neutrophils Relative %: 60 %
Platelet Count: 68 K/uL — ABNORMAL LOW (ref 150–400)
RBC: 4.26 MIL/uL (ref 4.22–5.81)
RDW: 15.9 % — ABNORMAL HIGH (ref 11.5–15.5)
WBC Count: 4.8 K/uL (ref 4.0–10.5)
nRBC: 0 % (ref 0.0–0.2)

## 2023-12-13 LAB — RETIC PANEL
Immature Retic Fract: 19.4 % — ABNORMAL HIGH (ref 2.3–15.9)
RBC.: 4.2 MIL/uL — ABNORMAL LOW (ref 4.22–5.81)
Retic Count, Absolute: 96.2 K/uL (ref 19.0–186.0)
Retic Ct Pct: 2.3 % (ref 0.4–3.1)
Reticulocyte Hemoglobin: 32.8 pg (ref >27.9)

## 2023-12-13 LAB — IRON AND IRON BINDING CAPACITY (CC-WL,HP ONLY)
Iron: 43 ug/dL — ABNORMAL LOW (ref 45–182)
Saturation Ratios: 10 % — ABNORMAL LOW (ref 17.9–39.5)
TIBC: 434 ug/dL (ref 250–450)
UIBC: 391 ug/dL — ABNORMAL HIGH (ref 117–376)

## 2023-12-13 LAB — FERRITIN: Ferritin: 117 ng/mL (ref 24–336)

## 2023-12-13 NOTE — Progress Notes (Signed)
 [facility] [phone]   [phone]  PROGRESS NOTE  Patient Care Team: Cleotilde Planas, MD as PCP - General (Family Medicine) Pietro Redell RAMAN, MD as PCP - Cardiology (Cardiology) Georjean Darice HERO, MD as Consulting Physician (Neurology) Lucienne Ripley POUR, MD as Referring Physician (Cardiology) Harlene Riis, MD (Inactive) as Referring Physician (Cardiology) Lennard Lesta FALCON, MD as Consulting Physician (Gastroenterology)  Hematological/Oncological History # Thrombocytopenia # Normocytic Anemia Labs from PCP, Dr. Planas Cleotilde. 09/26/2023: WBC 4.5, Hgb 12.7(L), MCV 83.3, Plt 74 (L) 10/24/2023: WBC 4.8, Hgb 11.7, MCV 85.9, Plt 65 (L) 11/06/2023: Establish care with [facility] Hematology  Interval History:  Alan Willis 78 y.o. male with medical history significant for thrombocytopenia and normocytic anemia who presents for a follow up visit. The patient's last visit was on 11/06/2023. 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  On exam today Mr. Luckadoo is accompanied by his wife.  He reports the bone marrow biopsy procedure went well.  He did not have any residual pain, bleeding, or bruising.  He reports since our last visit his energy levels have been low and he has been sleeping a lot taking naps in both the morning and the afternoon.  He is doing his best to try to eat well.  He did have 1 nosebleed in the interim since our last visit but he thinks this was due to a scratch on the inside of his nose.  He has not had any issues with lightheadedness, dizziness, shortness of breath.  He has had no issues with inflammatory symptoms such as rash, joint pain, or other symptoms.  He reports that he does have some frequent heartburn and occasional stomach upset.  Otherwise he has had no fevers, chills, sweats, nausea, vomiting or diarrhea.  A full 10 point ROS is otherwise  negative.  The bulk of our discussion focused on the results of the bone marrow biopsy and steps moving forward.  Details of this conversation are noted below.  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   Stricture of sigmoid colon s/p colectomy 2007 2007    SURGICAL HISTORY: Past Surgical History:  Procedure Laterality Date   ATRIAL FIBRILLATION ABLATION     BIOPSY  03/05/2023   Procedure: BIOPSY;  Surgeon: Saintclair Jasper, MD;  Location: [facility];  Service: Gastroenterology;;   CARDIOVERSION N/A 03/13/2023   Procedure: CARDIOVERSION;  Surgeon: Alvan Ronal BRAVO, MD;  [facility];  Service: Cardiovascular;  Laterality: N/A;   CARDIOVERSION N/A 03/13/2023   Procedure: CARDIOVERSION;  Surgeon: Alvan Ronal BRAVO, MD;  [facility];  Service: Cardiovascular;  Laterality: N/A;   COLON RESECTION SIGMOID  07/26/2005   Dr Eletha for diverticular sigmoid colon stricture   ESOPHAGOGASTRODUODENOSCOPY (EGD) WITH PROPOFOL  N/A 03/05/2023   Procedure: ESOPHAGOGASTRODUODENOSCOPY (EGD) WITH PROPOFOL ;  Surgeon: Saintclair Jasper, MD;  Location: [facility];  Service: Gastroenterology;  Laterality: N/A;   HEMOSTASIS CLIP PLACEMENT  03/05/2023   Procedure: HEMOSTASIS CLIP  PLACEMENT;  Surgeon: Saintclair Jasper, MD;  Location: [facility];  Service: Gastroenterology;;   LAPAROSCOPIC INGUINAL HERNIA REPAIR Bilateral 04/03/1999   Dr Fredrik   VALVE REPLACEMENT      SOCIAL HISTORY: Social History   Socioeconomic History   Marital status: Married    Spouse name: Not on file    Number of children: Not on file   Years of education: Not on file   Highest education level: Not on file  Occupational History   Not on file  Tobacco Use   Smoking status: Some Days    Types: Cigars   Smokeless tobacco: Never   Tobacco comments:    Cigars once a week use  Vaping Use   Vaping status: Never Used  Substance and Sexual Activity   Alcohol use: Not Currently   Drug use: Never   Sexual activity: Not on file  Other Topics Concern   Not on file  Social History Narrative   Right handed    Lives with family    Retired    Drinks a etoh free beer a day    Social Drivers of Corporate investment banker Strain: Not on file  Food Insecurity: No Food Insecurity (11/06/2023)   Hunger Vital Sign    Worried About Running Out of Food in the Last Year: Never true    Ran Out of Food in the Last Year: Never true  Transportation Needs: No Transportation Needs (11/06/2023)   PRAPARE - Administrator, Civil Service (Medical): No    Lack of Transportation (Non-Medical): No  Physical Activity: Not on file  Stress: Not on file  Social Connections: Not on file  Intimate Partner Violence: Not At Risk (11/06/2023)   Humiliation, Afraid, Rape, and Kick questionnaire    Fear of Current or Ex-Partner: No    Emotionally Abused: No    Physically Abused: No    Sexually Abused: No    FAMILY HISTORY: No family history on file.  ALLERGIES:  is allergic to amoxicillin-pot clavulanate and codeine.  MEDICATIONS:  Current Outpatient Medications  Medication Sig Dispense Refill   Cholecalciferol (VITAMIN D3) 1000 units CAPS Take 1,000 Units by mouth daily.     doxycycline  (VIBRA -TABS) 100 MG tablet Take 100 mg by mouth See admin instructions. Take 100 mg by mouth 30-60 minutes prior to dental procedures     empagliflozin  (JARDIANCE ) 10 MG TABS tablet Take 1 tablet (10 mg total) by mouth daily. 90 tablet 3   ferrous sulfate 325 (65 FE) MG tablet Take 325 mg by mouth daily with  breakfast.     furosemide  (LASIX ) 20 MG tablet Take 20 mg by mouth daily.     IMODIUM A-D 2 MG tablet Take 2 mg by mouth 4 (four) times daily as needed for diarrhea or loose stools. (Patient not taking: No sig reported)     levETIRAcetam  (KEPPRA ) 500 MG tablet Take 1 tablet in AM and 1 and 1/2 tablets in PM 225 tablet 3   magnesium  oxide (MAG-OX) 400 (240 Mg) MG tablet Take 400 mg by mouth daily.     ondansetron  (ZOFRAN ) 4 MG tablet Take 4 mg by mouth every 8 (eight) hours as needed.     rosuvastatin  (CRESTOR ) 20 MG tablet Take 1 tablet (20 mg total) by mouth daily. 90 tablet 3   TYLENOL  500 MG tablet Take 500 mg by mouth every 6 (six) hours as needed for mild pain (pain score 1-3) or headache.  warfarin (COUMADIN ) 6 MG tablet Take 3-6 mg by mouth See admin instructions. Take 6 mg by mouth in the morning on Sun/Mon/Wed/Thurs/Fri/Sat and 3 mg on Tues     No current facility-administered medications for this visit.    REVIEW OF SYSTEMS:   Constitutional: ( - ) fevers, ( - )  chills , ( - ) night sweats Eyes: ( - ) blurriness of vision, ( - ) double vision, ( - ) watery eyes Ears, nose, mouth, throat, and face: ( - ) mucositis, ( - ) sore throat Respiratory: ( - ) cough, ( - ) dyspnea, ( - ) wheezes Cardiovascular: ( - ) palpitation, ( - ) chest discomfort, ( - ) lower extremity swelling Gastrointestinal:  ( - ) nausea, ( - ) heartburn, ( - ) change in bowel habits Skin: ( - ) abnormal skin rashes Lymphatics: ( - ) new lymphadenopathy, ( - ) easy bruising Neurological: ( - ) numbness, ( - ) tingling, ( - ) new weaknesses Behavioral/Psych: ( - ) mood change, ( - ) new changes  All other systems were reviewed with the patient and are negative.  PHYSICAL EXAMINATION:  Vitals:   12/13/23 0953  BP: 121/78  Pulse: (!) 47  Resp: 13  Temp: 98 F (36.7 C)  SpO2: 98%   Filed Weights   12/13/23 0953  Weight: 167 lb 14.4 oz (76.2 kg)    GENERAL: Well-appearing elderly Caucasian male,  alert, no distress and comfortable SKIN: skin color, texture, turgor are normal, no rashes or significant lesions EYES: conjunctiva are pink and non-injected, sclera clear LUNGS: clear to auscultation and percussion with normal breathing effort HEART: regular rate & rhythm and no murmurs and no lower extremity edema Musculoskeletal: no cyanosis of digits and no clubbing  PSYCH: alert & oriented x 3, fluent speech NEURO: no focal motor/sensory deficits  LABORATORY DATA:  I have reviewed the data as listed    Latest Ref Rng & Units 12/13/2023    9:22 AM 12/09/2023    9:12 AM 11/06/2023   10:04 AM  CBC  WBC 4.0 - 10.5 K/uL 4.8  4.7  4.6   Hemoglobin 13.0 - 17.0 g/dL 87.4  87.9  87.2   Hematocrit 39.0 - 52.0 % 38.7  38.2  38.8   Platelets 150 - 400 K/uL 68  65  72        Latest Ref Rng & Units 12/13/2023    9:22 AM 11/06/2023   10:04 AM 05/03/2023    9:39 AM  CMP  Glucose 70 - 99 mg/dL 91  90  898   BUN 8 - 23 mg/dL 14  12  12    Creatinine 0.61 - 1.24 mg/dL 8.50  8.58  8.64   Sodium 135 - 145 mmol/L 134  133  139   Potassium 3.5 - 5.1 mmol/L 4.4  4.4  3.7   Chloride 98 - 111 mmol/L 100  99  100   CO2 22 - 32 mmol/L 29  28  24    Calcium  8.9 - 10.3 mg/dL 9.3  9.3  9.3   Total Protein 6.5 - 8.1 g/dL 7.4  7.7  6.9   Total Bilirubin 0.0 - 1.2 mg/dL 1.6  1.5  0.5   Alkaline Phos 38 - 126 U/L 153  159  86   AST 15 - 41 U/L 23  25  25    ALT 0 - 44 U/L 17  19  15      Lab Results  Component Value Date   MPROTEIN Not Observed 11/06/2023   Lab Results  Component Value Date   KPAFRELGTCHN 46.6 (H) 11/06/2023   LAMBDASER 33.0 (H) 11/06/2023   KAPLAMBRATIO 1.41 11/06/2023    RADIOGRAPHIC STUDIES: CT BONE MARROW BIOPSY & ASPIRATION Result Date: 12/09/2023 CLINICAL DATA:  Thrombocytopenia, normocytic anemia and need for bone marrow biopsy. EXAM: CT GUIDED BONE MARROW ASPIRATION AND BIOPSY ANESTHESIA/SEDATION: Moderate (conscious) sedation was employed during this procedure. A total of  Versed  1.0 mg and Fentanyl  mcg was administered intravenously. Moderate Sedation Time: 12 minutes. The patient's level of consciousness and vital signs were monitored continuously by radiology nursing throughout the procedure under my direct supervision. PROCEDURE: The procedure risks, benefits, and alternatives were explained to the patient. Questions regarding the procedure were encouraged and answered. The patient understands and consents to the procedure. A time out was performed prior to initiating the procedure. The right gluteal region was prepped with chlorhexidine . Sterile gown and sterile gloves were used for the procedure. Local anesthesia was provided with 1% Lidocaine . Under CT guidance, an 11 gauge On Control bone cutting needle was advanced from a posterior approach into the right iliac bone. Needle positioning was confirmed with CT. Initial non heparinized and heparinized aspirate samples were obtained of bone marrow. Core biopsy was performed via the On Control drill needle. COMPLICATIONS: None FINDINGS: Inspection of initial aspirate did reveal visible particles. Intact core biopsy sample was obtained. IMPRESSION: CT guided bone marrow biopsy of right posterior iliac bone with both aspirate and core samples obtained. Electronically Signed   By: Marcey Moan M.D.   On: 12/09/2023 11:11    ASSESSMENT & PLAN Alan Willis 78 y.o. male with medical history significant for thrombocytopenia and normocytic anemia who presents for a follow up visit.  Preliminary results of the bone marrow biopsy were available today.  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  Etiology at this time is unclear.  Patient tested negative for tuberculosis.  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  In the event that the further workup with a bone marrow biopsy does not reveal a clear etiology for his findings I would recommend a trial of steroid pulse.  The patient voiced  understanding of our findings and plan moving forward.   #Thrombocytopenia #Normocytic anemia: -- Labs today show white blood cell 4.8, hemoglobin 12.5, MCV 90.8, platelets 68 --Bone marrow biopsy performed on 12/09/2023 showed evidence of granulomas and inflammation, etiology unclear.  Final bone marrow biopsy report currently pending. --If no clear etiology is identified would recommend trial pulse of steroids to treat inflammation. --Patient voiced understanding of our findings and plan moving forward. --No other clear etiology was noted on our blood work.  Nutritional levels within normal limits with no other concerning abnormalities. --Return to clinic pending the final report of the bone marrow biopsy.    No orders of the defined types were placed in this encounter.   All questions were answered. The patient knows to call the clinic with any problems, questions or concerns.  A total of more than 30 minutes were spent on this encounter with face-to-face time and non-face-to-face time, including preparing to see the patient, ordering tests and/or medications, counseling the patient and coordination of care as outlined above.   Norleen IVAR Kidney, MD Department of Hematology/Oncology [facility] Phone: [phone] Pager: [phone] Email: [email or web address]  12/14/2023 4:59 PM

## 2023-12-17 LAB — SURGICAL PATHOLOGY

## 2023-12-18 ENCOUNTER — Encounter (HOSPITAL_COMMUNITY): Payer: Self-pay

## 2023-12-25 DIAGNOSIS — Z7901 Long term (current) use of anticoagulants: Secondary | ICD-10-CM | POA: Diagnosis not present

## 2023-12-26 ENCOUNTER — Other Ambulatory Visit: Payer: Self-pay | Admitting: Hematology and Oncology

## 2023-12-26 ENCOUNTER — Telehealth: Payer: Self-pay | Admitting: *Deleted

## 2023-12-26 DIAGNOSIS — D869 Sarcoidosis, unspecified: Secondary | ICD-10-CM

## 2023-12-26 NOTE — Telephone Encounter (Signed)
 Received vm message from pt's wife inquiring about final bone marrow biopsy results. Spoke with her. Advised that, per Dr. Federico, that the final results are reassuring, most consistent with Sarcoidosis.Advised that he has ordered a CT scan of the abdomen prior to starting treatment. Advised that once Dr. Federico has those results he will order steroids for the treatment of the sarcoidosis. [name] voiced understanding. Advised that central radiology scheduling will call regarding when his CT scan will be.

## 2024-01-01 DIAGNOSIS — Z952 Presence of prosthetic heart valve: Secondary | ICD-10-CM | POA: Diagnosis not present

## 2024-01-01 DIAGNOSIS — Z7901 Long term (current) use of anticoagulants: Secondary | ICD-10-CM | POA: Diagnosis not present

## 2024-01-02 ENCOUNTER — Ambulatory Visit (HOSPITAL_COMMUNITY)
Admission: RE | Admit: 2024-01-02 | Discharge: 2024-01-02 | Disposition: A | Discharge status: Home / Self Care | Source: Ambulatory Visit | Attending: Hematology and Oncology | Admitting: Hematology and Oncology

## 2024-01-02 DIAGNOSIS — R161 Splenomegaly, not elsewhere classified: Secondary | ICD-10-CM | POA: Diagnosis not present

## 2024-01-02 DIAGNOSIS — D696 Thrombocytopenia, unspecified: Secondary | ICD-10-CM | POA: Diagnosis not present

## 2024-01-02 DIAGNOSIS — K4091 Unilateral inguinal hernia, without obstruction or gangrene, recurrent: Secondary | ICD-10-CM | POA: Insufficient documentation

## 2024-01-02 DIAGNOSIS — D869 Sarcoidosis, unspecified: Secondary | ICD-10-CM | POA: Insufficient documentation

## 2024-01-02 DIAGNOSIS — N400 Enlarged prostate without lower urinary tract symptoms: Secondary | ICD-10-CM | POA: Insufficient documentation

## 2024-01-02 DIAGNOSIS — K8020 Calculus of gallbladder without cholecystitis without obstruction: Secondary | ICD-10-CM | POA: Diagnosis not present

## 2024-01-02 DIAGNOSIS — K5730 Diverticulosis of large intestine without perforation or abscess without bleeding: Secondary | ICD-10-CM | POA: Diagnosis not present

## 2024-01-02 MED ORDER — IOHEXOL 300 MG/ML  SOLN
100.0000 mL | Freq: Once | INTRAMUSCULAR | Status: AC | PRN
  Administered 2024-01-02: 100 mL via INTRAVENOUS

## 2024-01-07 ENCOUNTER — Telehealth: Payer: Self-pay | Admitting: Physician Assistant

## 2024-01-08 DIAGNOSIS — Z952 Presence of prosthetic heart valve: Secondary | ICD-10-CM | POA: Diagnosis not present

## 2024-01-08 DIAGNOSIS — Z7901 Long term (current) use of anticoagulants: Secondary | ICD-10-CM | POA: Diagnosis not present

## 2024-01-09 NOTE — Telephone Encounter (Signed)
 I spoke to wife of Alan Willis regarding CT scan results and Dr. Lafonda recommendations for sarcoidosis found in the bone marrow.   CT scan showed some splenomegaly, but no other lymphadenopathy. Dr. Federico recommends prednisone 60 mg PO daily x 1 month.   Mrs. Springs shared concerns with interaction with his other cardiac medications and history of CHF. Patient has a follow up with his cardiologist on 10/16 to further discuss. I will schedule a follow up with Dr. Federico on 10/17 to finalize recommendations.   Mrs. Sigg expressed understanding of the plan provided.

## 2024-01-16 NOTE — Progress Notes (Signed)
 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  Patient admitted with GI bleed November 2024 due to Mallory-Weiss tear which was treated with hemostatic clip.  CTA November 2024 Calcium  score 588.  Echocardiogram November 2024 showed ejection fraction 40 to 45%, severe biatrial enlargement, moderate to severe mitral stenosis, mild to moderate mitral regurgitation, prior mechanical aortic valve.  Decreased ejection fraction felt possibly secondary to tachycardia.  Patient underwent cardioversion at that time but atrial flutter recurred and he was felt to be a failure of tikosyn .  He was therefore initiated on amiodarone . Follow-up monitor showed persistent atrial flutter and rate control was pursued as he was asymptomatic. Monitor July 2025 showed sinus rhythm, transient junctional rhythm, PACs, PVCs and rare couplet.  Echocardiogram July 2025 showed ejection fraction 40 to 45%, mild left ventricular enlargement, mild left ventricular hypertrophy, mild RV dysfunction, severe biatrial enlargement, moderate to severe mitral regurgitation, moderate to severe mitral stenosis with mean gradient 7 mmHg, status post aortic valve replacement with trace aortic insufficiency and mean gradient 16.7 mmHg.  Also with recent development of thrombocytopenia requiring hematology oncology evaluation.  Abdominal CT September 2025 showed new splenomegaly.  Since he was last seen patient denies dyspnea, chest pain, palpitations or syncope.  No bleeding.  Current Outpatient Medications  Medication Sig Dispense Refill   Cholecalciferol (VITAMIN D3) 1000 units CAPS Take 1,000 Units by mouth daily.     doxycycline  (VIBRA -TABS) 100 MG tablet Take 100 mg by mouth See admin instructions. Take 100 mg by mouth 30-60 minutes prior to dental procedures     empagliflozin  (JARDIANCE ) 10 MG TABS tablet  Take 1 tablet (10 mg total) by mouth daily. 90 tablet 3   ferrous sulfate 325 (65 FE) MG tablet Take 325 mg by mouth daily with breakfast.     furosemide  (LASIX ) 20 MG tablet Take 20 mg by mouth daily.     IMODIUM A-D 2 MG tablet Take 2 mg by mouth 4 (four) times daily as needed for diarrhea or loose stools.     levETIRAcetam  (KEPPRA ) 500 MG tablet Take 1 tablet in AM and 1 and 1/2 tablets in PM 225 tablet 3   magnesium  oxide (MAG-OX) 400 (240 Mg) MG tablet Take 400 mg by mouth daily.     ondansetron  (ZOFRAN ) 4 MG tablet Take 4 mg by mouth every 8 (eight) hours as needed.     rosuvastatin  (CRESTOR ) 20 MG tablet Take 1 tablet (20 mg total) by mouth daily. 90 tablet 3   TYLENOL  500 MG tablet Take 500 mg by mouth every 6 (six) hours as needed for mild pain (pain score 1-3) or headache.     warfarin (COUMADIN ) 6 MG tablet Take 3-6 mg by mouth See admin instructions. Take 6 mg by mouth in the morning on Sun/Mon/Wed/Thurs/Fri/Sat and 3 mg on Tues     No current facility-administered medications for this visit.     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   Stricture of sigmoid colon s/p colectomy 2007  2007    Past Surgical History:  Procedure Laterality Date   ATRIAL FIBRILLATION ABLATION     BIOPSY  03/05/2023   Procedure: BIOPSY;  Surgeon: Saintclair Jasper, MD;  Location: [facility];  Service: Gastroenterology;;   CARDIOVERSION N/A 03/13/2023   Procedure: CARDIOVERSION;  Surgeon: Alvan Ronal BRAVO, MD;  [facility];  Service: Cardiovascular;  Laterality: N/A;   CARDIOVERSION N/A 03/13/2023    Procedure: CARDIOVERSION;  Surgeon: Alvan Ronal BRAVO, MD;  [facility];  Service: Cardiovascular;  Laterality: N/A;   COLON RESECTION SIGMOID  07/26/2005   Dr Eletha for diverticular sigmoid colon stricture   ESOPHAGOGASTRODUODENOSCOPY (EGD) WITH PROPOFOL  N/A 03/05/2023   Procedure: ESOPHAGOGASTRODUODENOSCOPY (EGD) WITH PROPOFOL ;  Surgeon: Saintclair Jasper, MD;  [facility];  Service: Gastroenterology;  Laterality: N/A;   HEMOSTASIS CLIP PLACEMENT  03/05/2023   Procedure: HEMOSTASIS CLIP PLACEMENT;  Surgeon: Saintclair Jasper, MD;  [facility];  Service: Gastroenterology;;   LAPAROSCOPIC INGUINAL HERNIA REPAIR Bilateral 04/03/1999   Dr Fredrik   VALVE REPLACEMENT      Social History   Socioeconomic History   Marital status: Married    Spouse name: Not on file   Number of children: Not on file   Years of education: Not on file   Highest education level: Not on file  Occupational History   Not on file  Tobacco Use   Smoking status: Some Days    Types: Cigars   Smokeless tobacco: Never   Tobacco comments:    Cigars once a week use  Vaping Use   Vaping status: Never Used  Substance and Sexual Activity   Alcohol use: Not Currently   Drug use: Never   Sexual activity: Not on file  Other Topics Concern   Not on file  Social History Narrative   Right handed    Lives with family    Retired    Drinks a etoh free beer a day    Social Drivers of Corporate investment banker Strain: Not on file  Food Insecurity: No Food Insecurity (11/06/2023)   Hunger Vital Sign    Worried About Running Out of Food in the Last Year: Never true    Ran Out of Food in the Last Year: Never true  Transportation Needs: No Transportation Needs (11/06/2023)   PRAPARE - Administrator, Civil Service (Medical): No    Lack of Transportation (Non-Medical): No  Physical Activity: Not on file  Stress: Not on file  Social Connections: Not on file  Intimate Partner  Violence: Not At Risk (11/06/2023)   Humiliation, Afraid, Rape, and Kick questionnaire    Fear of Current or Ex-Partner: No    Emotionally Abused: No    Physically Abused: No    Sexually Abused: No    History reviewed. No pertinent family history.  ROS: no fevers or chills, productive cough, hemoptysis, dysphasia, odynophagia, melena, hematochezia, dysuria, hematuria, rash, seizure activity, orthopnea, PND, pedal edema, claudication. Remaining systems are negative.  Physical Exam: Well-developed well-nourished in no acute distress.  Skin is warm and dry.  HEENT is normal.  Neck is supple.  Chest is clear to auscultation with normal expansion.  Cardiovascular exam is regular rate and rhythm.  3/6 systolic murmur left sternal border. Abdominal exam nontender or distended. No masses palpated. Extremities show no edema. neuro grossly intact   A/P  1 paroxysmal atrial fibrillation/flutter-plan to continue amiodarone  for rate control as he had problems with hypotension previously.  Also previously noted to have bradycardia and amiodarone  decreased to 100 mg daily.  Follow-up monitor showed sinus rhythm with transient junctional rhythm at 10:45 PM but he was asleep at that time.  No prolonged pauses noted.  Continue Coumadin .  2 history of aortic valve replacement-continue SBE prophylaxis and Coumadin .  3 mitral stenosis/mitral regurgitation-moderate to severe mitral stenosis and mild mitral regurgitation noted on most recent echocardiogram.  I previously reviewed his echocardiogram with Dr. Dede.  He may need evaluation by our structural team in the near future once his thrombocytopenia/sarcoid is addressed.  4 cardiomyopathy-LV function mildly reduced on most recent echocardiogram.  5 coronary calcification-continue statin.  6 thrombocytopenia/sarcoid-per hematology oncology.  Bone marrow biopsy showed granulomas and he has been diagnosed with sarcoid.  Will likely require steroid  therapy.  Redell Shallow, MD

## 2024-01-22 DIAGNOSIS — Z7901 Long term (current) use of anticoagulants: Secondary | ICD-10-CM | POA: Diagnosis not present

## 2024-01-22 DIAGNOSIS — Z952 Presence of prosthetic heart valve: Secondary | ICD-10-CM | POA: Diagnosis not present

## 2024-01-30 ENCOUNTER — Encounter: Payer: Self-pay | Admitting: Cardiology

## 2024-01-30 ENCOUNTER — Ambulatory Visit: Attending: Cardiology | Admitting: Cardiology

## 2024-01-30 VITALS — BP 113/60 | HR 50 | Ht 70.0 in | Wt 170.0 lb

## 2024-01-30 DIAGNOSIS — I4819 Other persistent atrial fibrillation: Secondary | ICD-10-CM

## 2024-01-30 DIAGNOSIS — E785 Hyperlipidemia, unspecified: Secondary | ICD-10-CM

## 2024-01-30 DIAGNOSIS — I050 Rheumatic mitral stenosis: Secondary | ICD-10-CM

## 2024-01-30 DIAGNOSIS — I340 Nonrheumatic mitral (valve) insufficiency: Secondary | ICD-10-CM

## 2024-01-30 DIAGNOSIS — I341 Nonrheumatic mitral (valve) prolapse: Secondary | ICD-10-CM

## 2024-01-30 DIAGNOSIS — I051 Rheumatic mitral insufficiency: Secondary | ICD-10-CM

## 2024-01-30 DIAGNOSIS — Z952 Presence of prosthetic heart valve: Secondary | ICD-10-CM

## 2024-01-30 NOTE — Patient Instructions (Signed)
   Follow-Up: At [facility], you and your health needs are our priority.  As part of our continuing mission to provide you with exceptional heart care, our providers are all part of one team.  This team includes your primary Cardiologist (physician) and Advanced Practice Providers or APPs (Physician Assistants and Nurse Practitioners) who all work together to provide you with the care you need, when you need it.  Your next appointment:   4-6 month(s)  Provider:   Redell Shallow, MD

## 2024-01-31 ENCOUNTER — Other Ambulatory Visit: Payer: Self-pay | Admitting: Hematology and Oncology

## 2024-01-31 ENCOUNTER — Inpatient Hospital Stay: Attending: Physician Assistant

## 2024-01-31 ENCOUNTER — Ambulatory Visit: Admitting: Neurology

## 2024-01-31 ENCOUNTER — Encounter: Payer: Self-pay | Admitting: Neurology

## 2024-01-31 ENCOUNTER — Inpatient Hospital Stay: Admitting: Hematology and Oncology

## 2024-01-31 VITALS — BP 132/77 | HR 50 | Temp 97.6°F | Resp 15 | Wt 170.5 lb

## 2024-01-31 VITALS — BP 111/60 | HR 50 | Wt 172.4 lb

## 2024-01-31 DIAGNOSIS — D649 Anemia, unspecified: Secondary | ICD-10-CM | POA: Insufficient documentation

## 2024-01-31 DIAGNOSIS — F17290 Nicotine dependence, other tobacco product, uncomplicated: Secondary | ICD-10-CM | POA: Diagnosis not present

## 2024-01-31 DIAGNOSIS — D869 Sarcoidosis, unspecified: Secondary | ICD-10-CM

## 2024-01-31 DIAGNOSIS — D696 Thrombocytopenia, unspecified: Secondary | ICD-10-CM | POA: Insufficient documentation

## 2024-01-31 DIAGNOSIS — G3184 Mild cognitive impairment, so stated: Secondary | ICD-10-CM | POA: Diagnosis not present

## 2024-01-31 DIAGNOSIS — G40009 Localization-related (focal) (partial) idiopathic epilepsy and epileptic syndromes with seizures of localized onset, not intractable, without status epilepticus: Secondary | ICD-10-CM

## 2024-01-31 LAB — CBC WITH DIFFERENTIAL (CANCER CENTER ONLY)
Abs Immature Granulocytes: 0.02 K/uL (ref 0.00–0.07)
Basophils Absolute: 0 K/uL (ref 0.0–0.1)
Basophils Relative: 1 %
Eosinophils Absolute: 0.3 K/uL (ref 0.0–0.5)
Eosinophils Relative: 6 %
HCT: 35.7 % — ABNORMAL LOW (ref 39.0–52.0)
Hemoglobin: 11.7 g/dL — ABNORMAL LOW (ref 13.0–17.0)
Immature Granulocytes: 1 %
Lymphocytes Relative: 14 %
Lymphs Abs: 0.6 K/uL — ABNORMAL LOW (ref 0.7–4.0)
MCH: 29.7 pg (ref 26.0–34.0)
MCHC: 32.8 g/dL (ref 30.0–36.0)
MCV: 90.6 fL (ref 80.0–100.0)
Monocytes Absolute: 0.6 K/uL (ref 0.1–1.0)
Monocytes Relative: 13 %
Neutro Abs: 2.9 K/uL (ref 1.7–7.7)
Neutrophils Relative %: 65 %
Platelet Count: 72 K/uL — ABNORMAL LOW (ref 150–400)
RBC: 3.94 MIL/uL — ABNORMAL LOW (ref 4.22–5.81)
RDW: 15.8 % — ABNORMAL HIGH (ref 11.5–15.5)
WBC Count: 4.4 K/uL (ref 4.0–10.5)
nRBC: 0 % (ref 0.0–0.2)

## 2024-01-31 LAB — CMP (CANCER CENTER ONLY)
ALT: 15 U/L (ref 0–44)
AST: 19 U/L (ref 15–41)
Albumin: 4 g/dL (ref 3.5–5.0)
Alkaline Phosphatase: 136 U/L — ABNORMAL HIGH (ref 38–126)
Anion gap: 4 — ABNORMAL LOW (ref 5–15)
BUN: 14 mg/dL (ref 8–23)
CO2: 28 mmol/L (ref 22–32)
Calcium: 9.4 mg/dL (ref 8.9–10.3)
Chloride: 99 mmol/L (ref 98–111)
Creatinine: 1.25 mg/dL — ABNORMAL HIGH (ref 0.61–1.24)
GFR, Estimated: 60 mL/min — ABNORMAL LOW (ref >60)
Glucose, Bld: 87 mg/dL (ref 70–99)
Potassium: 4.4 mmol/L (ref 3.5–5.1)
Sodium: 131 mmol/L — ABNORMAL LOW (ref 135–145)
Total Bilirubin: 2 mg/dL — ABNORMAL HIGH (ref 0.0–1.2)
Total Protein: 7 g/dL (ref 6.5–8.1)

## 2024-01-31 MED ORDER — LEVETIRACETAM 500 MG PO TABS: ORAL_TABLET | ORAL | 3 refills | Status: AC

## 2024-01-31 NOTE — Patient Instructions (Signed)
 Good to see you! Continue Keppra  500mg : take 1 tablet every morning, 1 and 1/2 tablets every night. Let me know if any problems if you proceed with Prednisone. Follow-up in 6 months, call for any changes   Seizure Precautions: 1. If medication has been prescribed for you to prevent seizures, take it exactly as directed.  Do not stop taking the medicine without talking to your doctor first, even if you have not had a seizure in a long time.   2. Avoid activities in which a seizure would cause danger to yourself or to others.  Don't operate dangerous machinery, swim alone, or climb in high or dangerous places, such as on ladders, roofs, or girders.  Do not drive unless your doctor says you may.  3. If you have any warning that you may have a seizure, lay down in a safe place where you can't hurt yourself.    4.  No driving for 6 months from last seizure, as per [place]  state law.   Please refer to the following link on the Epile[employer] of [employer]'s website for more information: http://www.epilepsyfoundation.org/answerplace/Social/driving/drivingu.cfm   5.  Maintain good sleep hygiene.   6.  Contact your doctor if you have any problems that may be related to the medicine you are taking.  7.  Call 911 and bring the patient back to the ED if:        A.  The seizure lasts longer than 5 minutes.       B.  The patient doesn't awaken shortly after the seizure  C.  The patient has new problems such as difficulty seeing, speaking or moving  D.  The patient was injured during the seizure  E.  The patient has a temperature over 102 F (39C)  F.  The patient vomited and now is having trouble breathing

## 2024-01-31 NOTE — Progress Notes (Signed)
 NEUROLOGY FOLLOW UP OFFICE NOTE  Alan Willis [id number] [birth year]  HISTORY OF PRESENT ILLNESS: I had the pleasure of seeing Alan Willis in follow-up in the neurology clinic on 10/17/202.  The patient was last seen 7 months ago for seizures and memory loss. He is again accompanied by his wife who helps supplement the history today. Records and images were personally reviewed where available. Since his last visit, he continues to do well from a seizure standpoint, no focal impaired awareness seizures since 06/2022 on Levetiracetamg 500mg  in AM, 750mg  in PM. No side effects. He denies any staring/unresponsive episodes, focal numbness/tingling/weakness, myoclonic jerks. He has occasional headaches. No dizziness, vision changes, no falls. He gets 7-8 hours of sleep, sometimes interrupted by needing to use the bathroom. Mood is good, he feels terrific, they exercise regularly. His wife does not think his memory has gotten worse. MMSE 24/30 in 12/2022. He does not drive much. We had discussed starting Memantine  on last visit but they decided to hold off due to his thrombocytopenia. 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 Prednisone treatment has been discussed however they have been concerned about potential side effects.     History on Initial Assessment 11/07/2021: This is a pleasant 78 year old right-handed man with a history of mechanical aortic valve replacement, atrial fibrillation on Coumadin , presenting for evaluation of new onset seizures. He was in his usual state of health, went to bed feeling fine and woke up to use the bathroom with no issues earlier that night, went back to sleep, then next recollection is coming to in the hospital. His wife reports that at around 4am she heard him make a guttural sound and found him convulsing with his head turned to the left side and arms flexed over his chest. This lasted a couple of minutes followed by  sonorous respiration. No focal weakness, he was confused when EMS arrived but started answering questions and was back to baseline in the ER. In the ER, he was found to have a small abrasion on the right side of his tongue. Bloodwork showed normal sodium 137, creatinine slightly elevated 1.33, normal WBC and glucose (104). I personally reviewed head CT without contrast which did not show any acute changes, there was moderate diffuse atrophy. EKG showed sinus tachycardia, LAE, consider biatrial enlargement, LBBB. He was discharged home on Levetiracetam  500mg  BID. He only took one dose yesterday and it made him sleepy. He did not take dose today.    He denies any prior history of convulsions, however his wife reports an incident last May 2023 on Mother's Day, they were talking when he suddenly stared off and was unresponsive for a few minutes. By the time EMS arrived, he started responding and vital signs were normal. He saw his cardiologist afterwards who noted his sodium level and Hct were low, so he was admitted to the hospital. His wife recalls a different episode in 2017 or 2018 while he was in the car and suddenly just stopped talking, she would see he was looking for his words with extended pauses but was not staring off. He denies any olfactory/gustatory hallucinations, deja vu, rising epigastric sensation, focal numbness/tingling/weakness, myoclonic jerks. Since the seizure, he has noticed pain in his left buttock and hip, with pain on hip flexion. 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 He usually takes Tylenol  with good response. No dizziness,  diplopia, dysarthria/dysphagia, neck pain, bowel/bladder dysfunction. He usually gets 8 hours of sleep. He drinks 1-2 beers daily but his wife reminds him he had more alcohol than usual the day prior when they had a family gathering. Memory is so-so. His wife  started noticing more confusion and memory changes 4-5 years ago, losing his keys, phone, and wallet more often. He was getting confused with directions, going to a different place from where she told him to go. He manages his own Coumadin , but she took over his other medications 1.5 years ago because he was forgetting them. He continues to manage finances without issues. Mood is pretty good, no personality changes, paranoia, or hallucinations.   I personally reviewed MRI brain with and without contrast done 12/2020 for increased thirst, confusion, no acute changes, there was moderate diffuse atrophy.   Epilepsy Risk Factors:  He had a normal birth and early development.  There is no history of febrile convulsions, CNS infections such as meningitis/encephalitis, significant traumatic brain injury, neurosurgical procedures, or family history of seizures.  Diagnostic Data: MRI brain with and without contrast done 11/2021 did not show any acute changes, there was mild diffuse atrophy.  EEG in 12/2021 was normal.   PAST MEDICAL HISTORY: Past Medical History:  Diagnosis Date   A-fib [facility])    Atrial flutter with rapid ventricular response (HCC) 03/16/2021   Cholelithiasis 2011   Chronic anticoagulation 03/03/2023   Colon, diverticulosis    H/O heart valve replacement with mechanical valve 03/03/2023   Bjork-Shiley tilting disc valve in 1983 at [facility] in [place], [place] .        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   Stricture of sigmoid colon s/p colectomy 2007 2007    MEDICATIONS: Current Outpatient Medications on File Prior to Visit  Medication Sig Dispense Refill   amiodarone  (PACERONE ) 200 MG tablet Take 100 mg by mouth daily.     Cholecalciferol (VITAMIN D3) 1000 units CAPS Take 1,000 Units by mouth daily.     doxycycline  (VIBRA -TABS) 100 MG tablet Take 100 mg by mouth See admin instructions. Take 100 mg by mouth 30-60 minutes prior to dental  procedures     empagliflozin  (JARDIANCE ) 10 MG TABS tablet Take 1 tablet (10 mg total) by mouth daily. 90 tablet 3   ferrous sulfate 325 (65 FE) MG tablet Take 325 mg by mouth daily with breakfast.     furosemide  (LASIX ) 20 MG tablet Take 20 mg by mouth daily.     IMODIUM A-D 2 MG tablet Take 2 mg by mouth 4 (four) times daily as needed for diarrhea or loose stools.     levETIRAcetam  (KEPPRA ) 500 MG tablet Take 1 tablet in AM and 1 and 1/2 tablets in PM 225 tablet 3   magnesium  oxide (MAG-OX) 400 (240 Mg) MG tablet Take 400 mg by mouth daily.     ondansetron  (ZOFRAN ) 4 MG tablet Take 4 mg by mouth every 8 (eight) hours as needed.     rosuvastatin  (CRESTOR ) 20 MG tablet Take 1 tablet (20 mg total) by mouth daily. 90 tablet 3   TYLENOL  500 MG tablet Take 500 mg by mouth every 6 (six) hours as needed for mild pain (pain score 1-3) or headache.     warfarin (COUMADIN ) 6 MG tablet Take 3-6 mg by mouth See admin instructions. Take 6 mg by mouth in the morning on Sun/Mon/Wed/Thurs/Fri/Sat and 3 mg on Tues     No current facility-administered medications  on file prior to visit.    ALLERGIES: Allergies  Allergen Reactions   Amoxicillin-Pot Clavulanate Rash   Codeine Other (See Comments)    Stomach upset    FAMILY HISTORY: No family history on file.  SOCIAL HISTORY: Social History   Socioeconomic History   Marital status: Married    Spouse name: Not on file   Number of children: Not on file   Years of education: Not on file   Highest education level: Not on file  Occupational History   Not on file  Tobacco Use   Smoking status: Some Days    Types: Cigars   Smokeless tobacco: Never   Tobacco comments:    Cigars once a week use  Vaping Use   Vaping status: Never Used  Substance and Sexual Activity   Alcohol use: Not Currently   Drug use: Never   Sexual activity: Not on file  Other Topics Concern   Not on file  Social History Narrative   Right handed    Lives with family     Retired    Drinks a etoh free beer a day    Social Drivers of Corporate investment banker Strain: Not on file  Food Insecurity: No Food Insecurity (11/06/2023)   Hunger Vital Sign    Worried About Running Out of Food in the Last Year: Never true    Ran Out of Food in the Last Year: Never true  Transportation Needs: No Transportation Needs (11/06/2023)   PRAPARE - Administrator, Civil Service (Medical): No    Lack of Transportation (Non-Medical): No  Physical Activity: Not on file  Stress: Not on file  Social Connections: Not on file  Intimate Partner Violence: Not At Risk (11/06/2023)   Humiliation, Afraid, Rape, and Kick questionnaire    Fear of Current or Ex-Partner: No    Emotionally Abused: No    Physically Abused: No    Sexually Abused: No     PHYSICAL EXAM: Vitals:   01/31/24 0810  BP: 111/60  Pulse: (!) 50  SpO2: 97%   General: No acute distress Head:  Normocephalic/atraumatic Skin/Extremities: No rash, no edema Neurological Exam: alert and awake. No aphasia or dysarthria. Fund of knowledge is appropriate.  Attention and concentration are normal.   Cranial nerves: Pupils equal, round. Extraocular movements intact with no nystagmus. Visual fields full.  No facial asymmetry.  Motor: Bulk and tone normal, muscle strength 5/5 throughout with no pronator drift.   Finger to nose testing intact.  Gait narrow-based and steady, no ataxia. No tremors.   IMPRESSION: This is a pleasant 78 yo RH man with a history of mechanical aortic valve replacement, atrial fibrillation on Coumadin , with seizures suggestive of right hemisphere onset. No focal impaired awareness seizures since 06/2022 on Levetiracetam  500mg  in AM, 750mg  in PM. He has mild MCI, MMSE 24/30 in 12/2022, memory stable. 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 They have been concerned about the prednisone, we discussed their concerns and discussed that from a neurological  standpoint, there is no contraindication to prednisone. He is aware of [place] driving laws to stop driving after a seizure until 6 months seizure-free. Follow-up in 6 months, call for any changes.   Thank you for allowing me to participate in his care.  Please do not hesitate to call for any questions or concerns.    Darice Shivers, M.D.   CC: Dr. Cleotilde, Dr. Federico

## 2024-01-31 NOTE — Progress Notes (Signed)
 [facility] Annalissa Murphey L[facility] [phone]   [phone]  PROGRESS NOTE  Patient Care Team: Cleotilde Planas, MD as PCP - General (Family Medicine) Pietro Redell RAMAN, MD as PCP - Cardiology (Cardiology) Georjean Darice HERO, MD as Consulting Physician (Neurology) Lucienne Ripley POUR, MD as Referring Physician (Cardiology) Harlene Riis, MD (Inactive) as Referring Physician (Cardiology) Lennard Lesta FALCON, MD as Consulting Physician (Gastroenterology)  Hematological/Oncological History # Thrombocytopenia # Normocytic Anemia Labs from PCP, Dr. Planas Cleotilde. 09/26/2023: WBC 4.5, Hgb 12.7(L), MCV 83.3, Plt 74 (L) 10/24/2023: WBC 4.8, Hgb 11.7, MCV 85.9, Plt 65 (L) 11/06/2023: Establish care with [facility] Hematology  Interval History:  DERICO MITTON 78 y.o. male with medical history significant for thrombocytopenia and normocytic anemia who presents for a follow up visit. The patient's last visit was on 12/13/2023. In the interim since the last visit he underwent a CT scan which showed no evidence of lymphadenopathy or disease elsewhere in the body.  On exam today Mr. Bonzo reports he is quite nervous about starting steroid therapy.  He notes that he is currently taking amiodarone  and is concerned that there may be some interactions with his steroids and his other medications.  There is also some concern he may interfere with his warfarin.  He reports that overall his energy levels are about the same as our last visit.  The bulk of our discussion focused on steroid therapy and what to expect moving forward.  After a long discussion about the risks and benefits they were willing to consider starting steroid therapy on 02/25/2024.  We will plan to have labs ordered 2 weeks and 4 weeks after that time.  MEDICAL HISTORY:  Past Medical History:  Diagnosis Date   A-fib [facility])    Atrial flutter with rapid ventricular response (HCC) 03/16/2021   Cholelithiasis 2011   Chronic anticoagulation  03/03/2023   Colon, diverticulosis    H/O heart valve replacement with mechanical valve 03/03/2023   Bjork-Shiley tilting disc valve in 1983 at [facility] in [place], [place] .        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   Stricture of sigmoid colon s/p colectomy 2007 2007    SURGICAL HISTORY: Past Surgical History:  Procedure Laterality Date   ATRIAL FIBRILLATION ABLATION     BIOPSY  03/05/2023   Procedure: BIOPSY;  Surgeon: Saintclair Jasper, MD;  Location: [facility];  Service: Gastroenterology;;   CARDIOVERSION N/A 03/13/2023   Procedure: CARDIOVERSION;  Surgeon: Alvan Ronal BRAVO, MD;  Location: [facility];  Service: Cardiovascular;  Laterality: N/A;   CARDIOVERSION N/A 03/13/2023   Procedure: CARDIOVERSION;  Surgeon: Alvan Ronal BRAVO, MD;  Location: [facility];  Service: Cardiovascular;  Laterality: N/A;   COLON RESECTION SIGMOID  07/26/2005   Dr Eletha for diverticular sigmoid colon stricture   ESOPHAGOGASTRODUODENOSCOPY (EGD) WITH PROPOFOL  N/A 03/05/2023   Procedure: ESOPHAGOGASTRODUODENOSCOPY (EGD) WITH PROPOFOL ;  Surgeon: Saintclair Jasper, MD;  Location: [facility];  Service: Gastroenterology;  Laterality: N/A;   HEMOSTASIS CLIP PLACEMENT  03/05/2023   Procedure: HEMOSTASIS CLIP PLACEMENT;  Surgeon: Saintclair Jasper, MD;  Location: [facility];  Service: Gastroenterology;;   LAPAROSCOPIC INGUINAL HERNIA REPAIR Bilateral 04/03/1999   Dr Fredrik   VALVE REPLACEMENT      SOCIAL HISTORY: Social History   Socioeconomic History   Marital status: Married    Spouse name: Not on file   Number of children: Not on file   Years of education: Not on file  Highest education level: Not on file  Occupational History   Not on file  Tobacco Use   Smoking status: Some Days    Types: Cigars   Smokeless tobacco: Never   Tobacco comments:    Cigars once a week use  Vaping Use   Vaping status: Never Used  Substance and Sexual Activity    Alcohol use: Not Currently   Drug use: Never   Sexual activity: Not on file  Other Topics Concern   Not on file  Social History Narrative   Right handed    Lives with family    Retired    Drinks a etoh free beer a day    Social Drivers of Corporate Investment Banker Strain: Not on file  Food Insecurity: No Food Insecurity (11/06/2023)   Hunger Vital Sign    Worried About Running Out of Food in the Last Year: Never true    Ran Out of Food in the Last Year: Never true  Transportation Needs: No Transportation Needs (11/06/2023)   PRAPARE - Administrator, Civil Service (Medical): No    Lack of Transportation (Non-Medical): No  Physical Activity: Not on file  Stress: Not on file  Social Connections: Not on file  Intimate Partner Violence: Not At Risk (11/06/2023)   Humiliation, Afraid, Rape, and Kick questionnaire    Fear of Current or Ex-Partner: No    Emotionally Abused: No    Physically Abused: No    Sexually Abused: No    FAMILY HISTORY: No family history on file.  ALLERGIES:  is allergic to amoxicillin-pot clavulanate and codeine.  MEDICATIONS:  Current Outpatient Medications  Medication Sig Dispense Refill   predniSONE (DELTASONE) 20 MG tablet Take 3 tablets (60 mg total) by mouth daily with breakfast. 63 tablet 1   amiodarone  (PACERONE ) 200 MG tablet Take 100 mg by mouth daily.     Cholecalciferol (VITAMIN D3) 1000 units CAPS Take 1,000 Units by mouth daily.     doxycycline  (VIBRA -TABS) 100 MG tablet Take 100 mg by mouth See admin instructions. Take 100 mg by mouth 30-60 minutes prior to dental procedures     empagliflozin  (JARDIANCE ) 10 MG TABS tablet Take 1 tablet (10 mg total) by mouth daily. 90 tablet 3   ferrous sulfate 325 (65 FE) MG tablet Take 325 mg by mouth daily with breakfast.     furosemide  (LASIX ) 20 MG tablet Take 20 mg by mouth daily.     IMODIUM A-D 2 MG tablet Take 2 mg by mouth 4 (four) times daily as needed for diarrhea or loose stools.      levETIRAcetam  (KEPPRA ) 500 MG tablet Take 1 tablet in AM and 1 and 1/2 tablets in PM 225 tablet 3   magnesium  oxide (MAG-OX) 400 (240 Mg) MG tablet Take 400 mg by mouth daily.     ondansetron  (ZOFRAN ) 4 MG tablet Take 4 mg by mouth every 8 (eight) hours as needed.     rosuvastatin  (CRESTOR ) 20 MG tablet Take 1 tablet (20 mg total) by mouth daily. 90 tablet 3   TYLENOL  500 MG tablet Take 500 mg by mouth every 6 (six) hours as needed for mild pain (pain score 1-3) or headache.     warfarin (COUMADIN ) 6 MG tablet Take 3-6 mg by mouth See admin instructions. Take 6 mg by mouth in the morning on Sun/Mon/Wed/Thurs/Fri/Sat and 3 mg on Tues     No current facility-administered medications for this visit.    REVIEW  OF SYSTEMS:   Constitutional: ( - ) fevers, ( - )  chills , ( - ) night sweats Eyes: ( - ) blurriness of vision, ( - ) double vision, ( - ) watery eyes Ears, nose, mouth, throat, and face: ( - ) mucositis, ( - ) sore throat Respiratory: ( - ) cough, ( - ) dyspnea, ( - ) wheezes Cardiovascular: ( - ) palpitation, ( - ) chest discomfort, ( - ) lower extremity swelling Gastrointestinal:  ( - ) nausea, ( - ) heartburn, ( - ) change in bowel habits Skin: ( - ) abnormal skin rashes Lymphatics: ( - ) new lymphadenopathy, ( - ) easy bruising Neurological: ( - ) numbness, ( - ) tingling, ( - ) new weaknesses Behavioral/Psych: ( - ) mood change, ( - ) new changes  All other systems were reviewed with the patient and are negative.  PHYSICAL EXAMINATION:  Vitals:   01/31/24 1322  BP: 132/77  Pulse: (!) 50  Resp: 15  Temp: 97.6 F (36.4 C)  SpO2: 97%    Filed Weights   01/31/24 1322  Weight: 170 lb 8 oz (77.3 kg)     GENERAL: Well-appearing elderly Caucasian male, alert, no distress and comfortable SKIN: skin color, texture, turgor are normal, no rashes or significant lesions EYES: conjunctiva are pink and non-injected, sclera clear LUNGS: clear to auscultation and percussion with  normal breathing effort HEART: regular rate & rhythm and no murmurs and no lower extremity edema Musculoskeletal: no cyanosis of digits and no clubbing  PSYCH: alert & oriented x 3, fluent speech NEURO: no focal motor/sensory deficits  LABORATORY DATA:  I have reviewed the data as listed    Latest Ref Rng & Units 01/31/2024   12:48 PM 12/13/2023    9:22 AM 12/09/2023    9:12 AM  CBC  WBC 4.0 - 10.5 K/uL 4.4  4.8  4.7   Hemoglobin 13.0 - 17.0 g/dL 88.2  87.4  87.9   Hematocrit 39.0 - 52.0 % 35.7  38.7  38.2   Platelets 150 - 400 K/uL 72  68  65        Latest Ref Rng & Units 01/31/2024   12:48 PM 12/13/2023    9:22 AM 11/06/2023   10:04 AM  CMP  Glucose 70 - 99 mg/dL 87  91  90   BUN 8 - 23 mg/dL 14  14  12    Creatinine 0.61 - 1.24 mg/dL 8.74  8.50  8.58   Sodium 135 - 145 mmol/L 131  134  133   Potassium 3.5 - 5.1 mmol/L 4.4  4.4  4.4   Chloride 98 - 111 mmol/L 99  100  99   CO2 22 - 32 mmol/L 28  29  28    Calcium  8.9 - 10.3 mg/dL 9.4  9.3  9.3   Total Protein 6.5 - 8.1 g/dL 7.0  7.4  7.7   Total Bilirubin 0.0 - 1.2 mg/dL 2.0  1.6  1.5   Alkaline Phos 38 - 126 U/L 136  153  159   AST 15 - 41 U/L 19  23  25    ALT 0 - 44 U/L 15  17  19      Lab Results  Component Value Date   MPROTEIN Not Observed 11/06/2023   Lab Results  Component Value Date   KPAFRELGTCHN 46.6 (H) 11/06/2023   LAMBDASER 33.0 (H) 11/06/2023   KAPLAMBRATIO 1.41 11/06/2023    RADIOGRAPHIC STUDIES: No results found.  ASSESSMENT & PLAN Alan Willis 78 y.o. male with medical history significant for thrombocytopenia and normocytic anemia who presents for a follow up visit.  Preliminary results of the bone marrow biopsy were available today.  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  Etiology at this time is unclear.  Patient tested negative for tuberculosis.  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  In the event that the further workup with a bone  marrow biopsy does not reveal a clear etiology for his findings I would recommend a trial of steroid pulse.  The patient voiced understanding of our findings and plan moving forward.   #Thrombocytopenia #Normocytic anemia: -- Labs today show white blood cell 4.4, hemoglobin 1.7, MCV 90.6, platelets 72. --Bone marrow biopsy performed on 12/09/2023 showed evidence of granulomas and inflammation, etiology unclear.  Final bone marrow biopsy report currently pending. -- Because there is no clear etiology identified on CT scan would recommend trial pulse of steroids to treat inflammation. --Patient voiced understanding of our findings and plan moving forward. --No other clear etiology was noted on our blood work.  Nutritional levels within normal limits with no other concerning abnormalities. --Return to clinic in 2 weeks after starting treatment for labs in 4 weeks for clinic visit    No orders of the defined types were placed in this encounter.   All questions were answered. The patient knows to call the clinic with any problems, questions or concerns.  A total of more than 30 minutes were spent on this encounter with face-to-face time and non-face-to-face time, including preparing to see the patient, ordering tests and/or medications, counseling the patient and coordination of care as outlined above.   Norleen IVAR Kidney, MD Department of Hematology/Oncology [facility] Phone: [phone] Pager: [phone] Email: [email or web address]  02/09/2024 5:11 PM

## 2024-02-05 DIAGNOSIS — Z7901 Long term (current) use of anticoagulants: Secondary | ICD-10-CM | POA: Diagnosis not present

## 2024-02-05 DIAGNOSIS — Z952 Presence of prosthetic heart valve: Secondary | ICD-10-CM | POA: Diagnosis not present

## 2024-02-09 MED ORDER — PREDNISONE 20 MG PO TABS: 60.0000 mg | ORAL_TABLET | Freq: Every day | ORAL | 1 refills | Status: DC

## 2024-03-04 ENCOUNTER — Encounter: Payer: Self-pay | Admitting: Neurology

## 2024-03-09 ENCOUNTER — Telehealth: Payer: Self-pay

## 2024-03-09 NOTE — Telephone Encounter (Signed)
 Pt's wife, [name],  phoned and left message regarding concerns about pt's medication and side effects. TC returned and [name] reported pt started Prednisone  60 mg and has difficulty sleeping; increased nausea and Warfarin dose has had to be changed frequently.  [name] is uncertain how long she can get her husband to continue with the prednisone . Discussed with Dr Federico and he ordered for pt to have labs as scheduled 11/25 and make a plan after getting results. . Emphasized pt should not abruptly;y stop Prednisone . Suggested Prilosec or Pepcid could be used for GI issues.  TC to pt to discuss plan of care. Left message to call nurse for information.

## 2024-03-10 ENCOUNTER — Telehealth: Payer: Self-pay

## 2024-03-10 ENCOUNTER — Other Ambulatory Visit: Payer: Self-pay | Admitting: Hematology and Oncology

## 2024-03-10 ENCOUNTER — Inpatient Hospital Stay: Attending: Physician Assistant

## 2024-03-10 DIAGNOSIS — D696 Thrombocytopenia, unspecified: Secondary | ICD-10-CM | POA: Diagnosis present

## 2024-03-10 DIAGNOSIS — D649 Anemia, unspecified: Secondary | ICD-10-CM | POA: Insufficient documentation

## 2024-03-10 LAB — CMP (CANCER CENTER ONLY)
ALT: 56 U/L — ABNORMAL HIGH (ref 0–44)
AST: 41 U/L (ref 15–41)
Albumin: 4.6 g/dL (ref 3.5–5.0)
Alkaline Phosphatase: 103 U/L (ref 38–126)
Anion gap: 10 (ref 5–15)
BUN: 22 mg/dL (ref 8–23)
CO2: 28 mmol/L (ref 22–32)
Calcium: 9.4 mg/dL (ref 8.9–10.3)
Chloride: 98 mmol/L (ref 98–111)
Creatinine: 1.29 mg/dL — ABNORMAL HIGH (ref 0.61–1.24)
GFR, Estimated: 57 mL/min — ABNORMAL LOW (ref >60)
Glucose, Bld: 93 mg/dL (ref 70–99)
Potassium: 4 mmol/L (ref 3.5–5.1)
Sodium: 136 mmol/L (ref 135–145)
Total Bilirubin: 1.6 mg/dL — ABNORMAL HIGH (ref 0.0–1.2)
Total Protein: 7.8 g/dL (ref 6.5–8.1)

## 2024-03-10 LAB — CBC WITH DIFFERENTIAL (CANCER CENTER ONLY)
Abs Immature Granulocytes: 0.24 K/uL — ABNORMAL HIGH (ref 0.00–0.07)
Basophils Absolute: 0 K/uL (ref 0.0–0.1)
Basophils Relative: 0 %
Eosinophils Absolute: 0 K/uL (ref 0.0–0.5)
Eosinophils Relative: 0 %
HCT: 47.8 % (ref 39.0–52.0)
Hemoglobin: 15.5 g/dL (ref 13.0–17.0)
Immature Granulocytes: 1 %
Lymphocytes Relative: 5 %
Lymphs Abs: 0.9 K/uL (ref 0.7–4.0)
MCH: 29.6 pg (ref 26.0–34.0)
MCHC: 32.4 g/dL (ref 30.0–36.0)
MCV: 91.4 fL (ref 80.0–100.0)
Monocytes Absolute: 0.8 K/uL (ref 0.1–1.0)
Monocytes Relative: 4 %
Neutro Abs: 18.4 K/uL — ABNORMAL HIGH (ref 1.7–7.7)
Neutrophils Relative %: 90 %
Platelet Count: 84 K/uL — ABNORMAL LOW (ref 150–400)
RBC: 5.23 MIL/uL (ref 4.22–5.81)
RDW: 15.8 % — ABNORMAL HIGH (ref 11.5–15.5)
WBC Count: 20.4 K/uL — ABNORMAL HIGH (ref 4.0–10.5)
nRBC: 0 % (ref 0.0–0.2)

## 2024-03-10 LAB — LACTATE DEHYDROGENASE: LDH: 238 U/L — ABNORMAL HIGH (ref 105–235)

## 2024-03-10 MED ORDER — PREDNISONE 20 MG PO TABS: 40.0000 mg | ORAL_TABLET | Freq: Every day | ORAL | Status: DC

## 2024-03-10 NOTE — Telephone Encounter (Signed)
 Returned TC to pt's wife , Alan Willis, regarding recent lab results and instructions on safely tapering Prednisone  medication.  Instructed per Dr Lafonda verbal order to reduce prednisone  to 40 mg daily x 2 wks and Dr Federico will see him in clinic 04/04/24 for scheduled appt. Alan Willis confirmed he has enough medication to last until 12/10  and will take on appt day prior to coming in. Advised to call for further questions/concerns.  Alan Willis verbalized understanding of medication instructions.

## 2024-03-24 ENCOUNTER — Encounter: Payer: Self-pay | Admitting: *Deleted

## 2024-03-24 ENCOUNTER — Other Ambulatory Visit: Payer: Self-pay | Admitting: Hematology and Oncology

## 2024-03-24 DIAGNOSIS — D869 Sarcoidosis, unspecified: Secondary | ICD-10-CM

## 2024-03-24 NOTE — Progress Notes (Unsigned)
 [facility] Telephone:([phone]   Fax:([phone]  PROGRESS NOTE  Patient Care Team: Cleotilde Planas, MD as PCP - General (Family Medicine) Pietro Redell RAMAN, MD as PCP - Cardiology (Cardiology) Georjean Darice HERO, MD as Consulting Physician (Neurology) Lucienne Ripley POUR, MD as Referring Physician (Cardiology) Harlene Riis, MD (Inactive) as Referring Physician (Cardiology) Lennard Lesta FALCON, MD as Consulting Physician (Gastroenterology)  Hematological/Oncological History # Thrombocytopenia # Normocytic Anemia Labs from PCP, Dr. Planas Cleotilde. 09/26/2023: WBC 4.5, Hgb 12.7(L), MCV 83.3, Plt 74 (L) 10/24/2023: WBC 4.8, Hgb 11.7, MCV 85.9, Plt 65 (L) 11/06/2023: Establish care with [facility]  Interval History:  Alan Willis 78 y.o. male with medical history significant for thrombocytopenia and normocytic anemia who presents for a follow up visit. The patient's last visit was on 12/13/2023. In the interim since the last visit he underwent a CT scan which showed no evidence of lymphadenopathy or disease elsewhere in the body.  On exam today Alan Willis reports he is quite nervous about starting steroid therapy.  He notes that he is currently taking amiodarone  and is concerned that there may be some interactions with his steroids and his other medications.  There is also some concern he may interfere with his warfarin.  He reports that overall his energy levels are about the same as our last visit.  The bulk of our discussion focused on steroid therapy and what to expect moving forward.  After a long discussion about the risks and benefits they were willing to consider starting steroid therapy on 02/25/2024.  We will plan to have labs ordered 2 weeks and 4 weeks after that time.  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   Stricture of sigmoid colon s/p colectomy 2007 2007    SURGICAL HISTORY: Past Surgical History:  Procedure Laterality Date   ATRIAL FIBRILLATION ABLATION     BIOPSY  03/05/2023   Procedure: BIOPSY;  Surgeon: Saintclair Jasper, MD;  Location: [facility];  Service: Gastroenterology;;   CARDIOVERSION N/A 03/13/2023   Procedure: CARDIOVERSION;  Surgeon: Alvan Ronal BRAVO, MD;  [facility];  Service: Cardiovascular;  Laterality: N/A;   CARDIOVERSION N/A 03/13/2023   Procedure: CARDIOVERSION;  Surgeon: Alvan Ronal BRAVO, MD;  [facility];  Service: Cardiovascular;  Laterality: N/A;   COLON RESECTION SIGMOID  07/26/2005   Dr Eletha for diverticular sigmoid colon stricture   ESOPHAGOGASTRODUODENOSCOPY (EGD) WITH PROPOFOL  N/A 03/05/2023   Procedure: ESOPHAGOGASTRODUODENOSCOPY (EGD) WITH PROPOFOL ;  Surgeon: Saintclair Jasper, MD;  Location: [facility];  Service: Gastroenterology;  Laterality: N/A;   HEMOSTASIS CLIP PLACEMENT  03/05/2023   Procedure: HEMOSTASIS CLIP PLACEMENT;  Surgeon: Saintclair Jasper, MD;  Location: [facility];  Service: Gastroenterology;;   LAPAROSCOPIC INGUINAL HERNIA REPAIR Bilateral 04/03/1999   Dr Fredrik   VALVE REPLACEMENT      SOCIAL HISTORY: Social History   Socioeconomic History   Marital status: Married    Spouse name: Not on file   Number of children: Not on file   Years of education: Not on file  Highest education level: Not on file  Occupational History   Not on file  Tobacco Use   Smoking status: Some Days    Types: Cigars   Smokeless tobacco: Never   Tobacco comments:    Cigars once a week use  Vaping Use   Vaping status: Never Used  Substance and Sexual Activity    Alcohol use: Not Currently   Drug use: Never   Sexual activity: Not on file  Other Topics Concern   Not on file  Social History Narrative   Right handed    Lives with family    Retired    Drinks a etoh free beer a day    Social Drivers of Corporate Investment Banker Strain: Not on file  Food Insecurity: No Food Insecurity (11/06/2023)   Hunger Vital Sign    Worried About Running Out of Food in the Last Year: Never true    Ran Out of Food in the Last Year: Never true  Transportation Needs: No Transportation Needs (11/06/2023)   PRAPARE - Administrator, Civil Service (Medical): No    Lack of Transportation (Non-Medical): No  Physical Activity: Not on file  Stress: Not on file  Social Connections: Not on file  Intimate Partner Violence: Not At Risk (11/06/2023)   Humiliation, Afraid, Rape, and Kick questionnaire    Fear of Current or Ex-Partner: No    Emotionally Abused: No    Physically Abused: No    Sexually Abused: No    FAMILY HISTORY: No family history on file.  ALLERGIES:  is allergic to amoxicillin-pot clavulanate and codeine.  MEDICATIONS:  Current Outpatient Medications  Medication Sig Dispense Refill   amiodarone  (PACERONE ) 200 MG tablet Take 100 mg by mouth daily.     Cholecalciferol (VITAMIN D3) 1000 units CAPS Take 1,000 Units by mouth daily.     doxycycline  (VIBRA -TABS) 100 MG tablet Take 100 mg by mouth See admin instructions. Take 100 mg by mouth 30-60 minutes prior to dental procedures     empagliflozin  (JARDIANCE ) 10 MG TABS tablet Take 1 tablet (10 mg total) by mouth daily. 90 tablet 3   ferrous sulfate 325 (65 FE) MG tablet Take 325 mg by mouth daily with breakfast.     furosemide  (LASIX ) 20 MG tablet Take 20 mg by mouth daily.     IMODIUM A-D 2 MG tablet Take 2 mg by mouth 4 (four) times daily as needed for diarrhea or loose stools.     levETIRAcetam  (KEPPRA ) 500 MG tablet Take 1 tablet in AM and 1 and 1/2 tablets in PM 225 tablet 3    magnesium  oxide (MAG-OX) 400 (240 Mg) MG tablet Take 400 mg by mouth daily.     ondansetron  (ZOFRAN ) 4 MG tablet Take 4 mg by mouth every 8 (eight) hours as needed.     predniSONE  (DELTASONE ) 20 MG tablet Take 2 tablets (40 mg total) by mouth daily with breakfast.     rosuvastatin  (CRESTOR ) 20 MG tablet Take 1 tablet (20 mg total) by mouth daily. 90 tablet 3   TYLENOL  500 MG tablet Take 500 mg by mouth every 6 (six) hours as needed for mild pain (pain score 1-3) or headache.     warfarin (COUMADIN ) 6 MG tablet Take 3-6 mg by mouth See admin instructions. Take 6 mg by mouth in the morning on Sun/Mon/Wed/Thurs/Fri/Sat and 3 mg on Tues     No current facility-administered medications for this visit.    REVIEW OF  SYSTEMS:   Constitutional: ( - ) fevers, ( - )  chills , ( - ) night sweats Eyes: ( - ) blurriness of vision, ( - ) double vision, ( - ) watery eyes Ears, nose, mouth, throat, and face: ( - ) mucositis, ( - ) sore throat Respiratory: ( - ) cough, ( - ) dyspnea, ( - ) wheezes Cardiovascular: ( - ) palpitation, ( - ) chest discomfort, ( - ) lower extremity swelling Gastrointestinal:  ( - ) nausea, ( - ) heartburn, ( - ) change in bowel habits Skin: ( - ) abnormal skin rashes Lymphatics: ( - ) new lymphadenopathy, ( - ) easy bruising Neurological: ( - ) numbness, ( - ) tingling, ( - ) new weaknesses Behavioral/Psych: ( - ) mood change, ( - ) new changes  All other systems were reviewed with the patient and are negative.  PHYSICAL EXAMINATION:  There were no vitals filed for this visit.   There were no vitals filed for this visit.    GENERAL: Well-appearing elderly Caucasian male, alert, no distress and comfortable SKIN: skin color, texture, turgor are normal, no rashes or significant lesions EYES: conjunctiva are pink and non-injected, sclera clear LUNGS: clear to auscultation and percussion with normal breathing effort HEART: regular rate & rhythm and no murmurs and no lower  extremity edema Musculoskeletal: no cyanosis of digits and no clubbing  PSYCH: alert & oriented x 3, fluent speech NEURO: no focal motor/sensory deficits  LABORATORY DATA:  I have reviewed the data as listed    Latest Ref Rng & Units 03/10/2024    8:26 AM 01/31/2024   12:48 PM 12/13/2023    9:22 AM  CBC  WBC 4.0 - 10.5 K/uL 20.4  4.4  4.8   Hemoglobin 13.0 - 17.0 g/dL 84.4  88.2  87.4   Hematocrit 39.0 - 52.0 % 47.8  35.7  38.7   Platelets 150 - 400 K/uL 84  72  68        Latest Ref Rng & Units 03/10/2024    8:26 AM 01/31/2024   12:48 PM 12/13/2023    9:22 AM  CMP  Glucose 70 - 99 mg/dL 93  87  91   BUN 8 - 23 mg/dL 22  14  14    Creatinine 0.61 - 1.24 mg/dL 8.70  8.74  8.50   Sodium 135 - 145 mmol/L 136  131  134   Potassium 3.5 - 5.1 mmol/L 4.0  4.4  4.4   Chloride 98 - 111 mmol/L 98  99  100   CO2 22 - 32 mmol/L 28  28  29    Calcium  8.9 - 10.3 mg/dL 9.4  9.4  9.3   Total Protein 6.5 - 8.1 g/dL 7.8  7.0  7.4   Total Bilirubin 0.0 - 1.2 mg/dL 1.6  2.0  1.6   Alkaline Phos 38 - 126 U/L 103  136  153   AST 15 - 41 U/L 41  19  23   ALT 0 - 44 U/L 56  15  17     Lab Results  Component Value Date   MPROTEIN Not Observed 11/06/2023   Lab Results  Component Value Date   KPAFRELGTCHN 46.6 (H) 11/06/2023   LAMBDASER 33.0 (H) 11/06/2023   KAPLAMBRATIO 1.41 11/06/2023    RADIOGRAPHIC STUDIES: No results found.   ASSESSMENT & PLAN Alan Willis 78 y.o. male with medical history significant for thrombocytopenia and normocytic anemia who presents for a  follow up visit.  Preliminary results of the bone marrow biopsy were available today.  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  Etiology at this time is unclear.  Patient tested negative for tuberculosis.  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  In the event that the further workup with a bone marrow biopsy does not reveal a clear etiology for his findings I would recommend a  trial of steroid pulse.  The patient voiced understanding of our findings and plan moving forward.   #Thrombocytopenia #Normocytic anemia: -- Labs today show white blood cell 4.4, hemoglobin 1.7, MCV 90.6, platelets 72. --Bone marrow biopsy performed on 12/09/2023 showed evidence of granulomas and inflammation, etiology unclear.  Final bone marrow biopsy report currently pending. -- Because there is no clear etiology identified on CT scan would recommend trial pulse of steroids to treat inflammation. --Patient voiced understanding of our findings and plan moving forward. --No other clear etiology was noted on our blood work.  Nutritional levels within normal limits with no other concerning abnormalities. --Return to clinic in 2 weeks after starting treatment for labs in 4 weeks for clinic visit    No orders of the defined types were placed in this encounter.   All questions were answered. The patient knows to call the clinic with any problems, questions or concerns.  A total of more than 30 minutes were spent on this encounter with face-to-face time and non-face-to-face time, including preparing to see the patient, ordering tests and/or medications, counseling the patient and coordination of care as outlined above.   Norleen IVAR Kidney, MD Department of Hematology/Oncology [facility] Phone: [phone] Pager: [phone] Email: [email or web address]  03/24/2024 9:02 AM

## 2024-03-25 ENCOUNTER — Other Ambulatory Visit: Payer: Self-pay | Admitting: Hematology and Oncology

## 2024-03-25 ENCOUNTER — Inpatient Hospital Stay

## 2024-03-25 ENCOUNTER — Inpatient Hospital Stay: Attending: Physician Assistant | Admitting: Hematology and Oncology

## 2024-03-25 VITALS — BP 112/70 | HR 55 | Temp 98.2°F | Resp 14 | Wt 163.2 lb

## 2024-03-25 DIAGNOSIS — D869 Sarcoidosis, unspecified: Secondary | ICD-10-CM | POA: Diagnosis not present

## 2024-03-25 DIAGNOSIS — D696 Thrombocytopenia, unspecified: Secondary | ICD-10-CM | POA: Diagnosis present

## 2024-03-25 DIAGNOSIS — D649 Anemia, unspecified: Secondary | ICD-10-CM | POA: Diagnosis not present

## 2024-03-25 DIAGNOSIS — F17290 Nicotine dependence, other tobacco product, uncomplicated: Secondary | ICD-10-CM | POA: Diagnosis not present

## 2024-03-25 DIAGNOSIS — Z79899 Other long term (current) drug therapy: Secondary | ICD-10-CM | POA: Insufficient documentation

## 2024-03-25 DIAGNOSIS — Z7952 Long term (current) use of systemic steroids: Secondary | ICD-10-CM | POA: Diagnosis not present

## 2024-03-25 LAB — CMP (CANCER CENTER ONLY)
ALT: 55 U/L — ABNORMAL HIGH (ref 0–44)
AST: 35 U/L (ref 15–41)
Albumin: 4.2 g/dL (ref 3.5–5.0)
Alkaline Phosphatase: 81 U/L (ref 38–126)
Anion gap: 9 (ref 5–15)
BUN: 21 mg/dL (ref 8–23)
CO2: 28 mmol/L (ref 22–32)
Calcium: 9.4 mg/dL (ref 8.9–10.3)
Chloride: 102 mmol/L (ref 98–111)
Creatinine: 1.24 mg/dL (ref 0.61–1.24)
GFR, Estimated: 60 mL/min — ABNORMAL LOW (ref >60)
Glucose, Bld: 74 mg/dL (ref 70–99)
Potassium: 4 mmol/L (ref 3.5–5.1)
Sodium: 138 mmol/L (ref 135–145)
Total Bilirubin: 1.3 mg/dL — ABNORMAL HIGH (ref 0.0–1.2)
Total Protein: 6.9 g/dL (ref 6.5–8.1)

## 2024-03-25 LAB — CBC WITH DIFFERENTIAL (CANCER CENTER ONLY)
Abs Immature Granulocytes: 0.14 K/uL — ABNORMAL HIGH (ref 0.00–0.07)
Basophils Absolute: 0 K/uL (ref 0.0–0.1)
Basophils Relative: 0 %
Eosinophils Absolute: 0 K/uL (ref 0.0–0.5)
Eosinophils Relative: 0 %
HCT: 46.6 % (ref 39.0–52.0)
Hemoglobin: 15.1 g/dL (ref 13.0–17.0)
Immature Granulocytes: 1 %
Lymphocytes Relative: 4 %
Lymphs Abs: 0.6 K/uL — ABNORMAL LOW (ref 0.7–4.0)
MCH: 29.8 pg (ref 26.0–34.0)
MCHC: 32.4 g/dL (ref 30.0–36.0)
MCV: 91.9 fL (ref 80.0–100.0)
Monocytes Absolute: 0.7 K/uL (ref 0.1–1.0)
Monocytes Relative: 5 %
Neutro Abs: 14.4 K/uL — ABNORMAL HIGH (ref 1.7–7.7)
Neutrophils Relative %: 90 %
Platelet Count: 60 K/uL — ABNORMAL LOW (ref 150–400)
RBC: 5.07 MIL/uL (ref 4.22–5.81)
RDW: 16.5 % — ABNORMAL HIGH (ref 11.5–15.5)
WBC Count: 15.9 K/uL — ABNORMAL HIGH (ref 4.0–10.5)
nRBC: 0 % (ref 0.0–0.2)

## 2024-03-25 LAB — LACTATE DEHYDROGENASE: LDH: 250 U/L — ABNORMAL HIGH (ref 105–235)

## 2024-03-26 ENCOUNTER — Telehealth: Payer: Self-pay | Admitting: *Deleted

## 2024-03-26 NOTE — Telephone Encounter (Signed)
 TCT pt's wife regarding recent lab results.  Spoke to wife, [name]. Advised that platelets dropped rather than improved. Dr. Federico recommends Alan Willis continue with the 40 mg PO prednisone  for now. Asked wife if he had enough for two weeks.  She states she will check his supply and then let me know if he needs any refills. She is aware of his next lab appt on 04/10/24

## 2024-04-09 ENCOUNTER — Other Ambulatory Visit: Payer: Self-pay | Admitting: Hematology and Oncology

## 2024-04-09 DIAGNOSIS — D696 Thrombocytopenia, unspecified: Secondary | ICD-10-CM

## 2024-04-10 ENCOUNTER — Inpatient Hospital Stay

## 2024-04-10 DIAGNOSIS — D696 Thrombocytopenia, unspecified: Secondary | ICD-10-CM | POA: Diagnosis not present

## 2024-04-10 LAB — CBC WITH DIFFERENTIAL (CANCER CENTER ONLY)
Abs Immature Granulocytes: 0.2 K/uL — ABNORMAL HIGH (ref 0.00–0.07)
Basophils Absolute: 0 K/uL (ref 0.0–0.1)
Basophils Relative: 0 %
Eosinophils Absolute: 0 K/uL (ref 0.0–0.5)
Eosinophils Relative: 0 %
HCT: 47.2 % (ref 39.0–52.0)
Hemoglobin: 15.3 g/dL (ref 13.0–17.0)
Immature Granulocytes: 1 %
Lymphocytes Relative: 5 %
Lymphs Abs: 0.8 K/uL (ref 0.7–4.0)
MCH: 29.5 pg (ref 26.0–34.0)
MCHC: 32.4 g/dL (ref 30.0–36.0)
MCV: 90.9 fL (ref 80.0–100.0)
Monocytes Absolute: 0.3 K/uL (ref 0.1–1.0)
Monocytes Relative: 2 %
Neutro Abs: 14.6 K/uL — ABNORMAL HIGH (ref 1.7–7.7)
Neutrophils Relative %: 92 %
Platelet Count: 87 K/uL — ABNORMAL LOW (ref 150–400)
RBC: 5.19 MIL/uL (ref 4.22–5.81)
RDW: 16.7 % — ABNORMAL HIGH (ref 11.5–15.5)
WBC Count: 16 K/uL — ABNORMAL HIGH (ref 4.0–10.5)
nRBC: 0 % (ref 0.0–0.2)

## 2024-04-10 LAB — CMP (CANCER CENTER ONLY)
ALT: 73 U/L — ABNORMAL HIGH (ref 0–44)
AST: 44 U/L — ABNORMAL HIGH (ref 15–41)
Albumin: 4.3 g/dL (ref 3.5–5.0)
Alkaline Phosphatase: 92 U/L (ref 38–126)
Anion gap: 11 (ref 5–15)
BUN: 23 mg/dL (ref 8–23)
CO2: 26 mmol/L (ref 22–32)
Calcium: 8.9 mg/dL (ref 8.9–10.3)
Chloride: 99 mmol/L (ref 98–111)
Creatinine: 1.47 mg/dL — ABNORMAL HIGH (ref 0.61–1.24)
GFR, Estimated: 49 mL/min — ABNORMAL LOW
Glucose, Bld: 136 mg/dL — ABNORMAL HIGH (ref 70–99)
Potassium: 4.4 mmol/L (ref 3.5–5.1)
Sodium: 136 mmol/L (ref 135–145)
Total Bilirubin: 1.5 mg/dL — ABNORMAL HIGH (ref 0.0–1.2)
Total Protein: 6.9 g/dL (ref 6.5–8.1)

## 2024-04-13 ENCOUNTER — Telehealth: Payer: Self-pay

## 2024-04-13 NOTE — Telephone Encounter (Signed)
 Returned TC to pt's wife regarding recent labs.  Left message for her to return our call as no ID information on voice mail.

## 2024-04-21 ENCOUNTER — Other Ambulatory Visit: Payer: Self-pay | Admitting: Hematology and Oncology

## 2024-04-21 DIAGNOSIS — D696 Thrombocytopenia, unspecified: Secondary | ICD-10-CM

## 2024-04-21 NOTE — Progress Notes (Unsigned)
 " [facility] Telephone:(336) [phone]   Fax:(336) [phone]  PROGRESS NOTE  Patient Care Team: Cleotilde Planas, MD as PCP - General (Family Medicine) Pietro Redell RAMAN, MD as PCP - Cardiology (Cardiology) Georjean Darice HERO, MD as Consulting Physician (Neurology) Lucienne Ripley POUR, MD as Referring Physician (Cardiology) Harlene Riis, MD (Inactive) as Referring Physician (Cardiology) Lennard Lesta FALCON, MD as Consulting Physician (Gastroenterology)  Hematological/Oncological History # Thrombocytopenia # Normocytic Anemia Labs from PCP, Dr. Planas Cleotilde. 09/26/2023: WBC 4.5, Hgb 12.7(L), MCV 83.3, Plt 74 (L) 10/24/2023: WBC 4.8, Hgb 11.7, MCV 85.9, Plt 65 (L) 11/06/2023: Establish care with [facility]ematology  Interval History:  Alan Willis 79 y.o. male with medical history significant for thrombocytopenia and normocytic anemia who presents for a follow up visit. The patient's last visit was on 12/102025. In the interim since the last visit he underwent a CT scan which showed no evidence of lymphadenopathy or disease elsewhere in the body.  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   Stricture of sigmoid colon s/p colectomy 2007 2007    SURGICAL HISTORY: Past Surgical History:  Procedure Laterality Date   ATRIAL FIBRILLATION ABLATION     BIOPSY  03/05/2023   Procedure: BIOPSY;  Surgeon: Saintclair Jasper, MD;  Location: [facility];  Service: Gastroenterology;;   CARDIOVERSION N/A 03/13/2023   Procedure: CARDIOVERSION;  Surgeon:  Alvan Ronal BRAVO, MD;  [facility];  Service: Cardiovascular;  Laterality: N/A;   CARDIOVERSION N/A 03/13/2023   Procedure: CARDIOVERSION;  Surgeon: Alvan Ronal BRAVO, MD;  [facility];  Service: Cardiovascular;  Laterality: N/A;   COLON RESECTION SIGMOID  07/26/2005   Dr Eletha for diverticular sigmoid colon stricture   ESOPHAGOGASTRODUODENOSCOPY (EGD) WITH PROPOFOL  N/A 03/05/2023   Procedure: ESOPHAGOGASTRODUODENOSCOPY (EGD) WITH PROPOFOL ;  Surgeon: Saintclair Jasper, MD;  Location[facility];  Service: Gastroenterology;  Laterality: N/A;   HEMOSTASIS CLIP PLACEMENT  03/05/2023   Procedure: HEMOSTASIS CLIP PLACEMENT;  Surgeon: Saintclair Jasper, MD;  [facility];  Service: Gastroenterology;;   LAPAROSCOPIC INGUINAL HERNIA REPAIR Bilateral 04/03/1999   Dr Fredrik   VALVE REPLACEMENT      SOCIAL HISTORY: Social History   Socioeconomic History   Marital status: Married    Spouse name: Not on file   Number of children: Not on file   Years of education: Not on file   Highest education level: Not on file  Occupational History   Not on file  Tobacco Use   Smoking status: Some Days    Types: Cigars   Smokeless tobacco: Never   Tobacco comments:    Cigars once a week use  Vaping Use   Vaping status: Never Used  Substance and Sexual Activity   Alcohol use: Not Currently   Drug use: Never   Sexual activity: Not on file  Other Topics Concern   Not on file  Social History Narrative   Right handed    Lives with family    Retired    Drinks a  etoh free beer a day    Social Drivers of Health   Tobacco Use: High Risk (01/31/2024)   Patient History    Smoking Tobacco Use: Some Days    Smokeless Tobacco Use: Never    Passive Exposure: Not on file  Financial Resource Strain: Not on file  Food Insecurity: No Food Insecurity (11/06/2023)   Epic    Worried About Programme Researcher, Broadcasting/film/video in the Last Year: Never true    Ran Out of Food in the Last Year:  Never true  Transportation Needs: No Transportation Needs (11/06/2023)   Epic    Lack of Transportation (Medical): No    Lack of Transportation (Non-Medical): No  Physical Activity: Not on file  Stress: Not on file  Social Connections: Not on file  Intimate Partner Violence: Not At Risk (11/06/2023)   Epic    Fear of Current or Ex-Partner: No    Emotionally Abused: No    Physically Abused: No    Sexually Abused: No  Depression (PHQ2-9): Low Risk (11/06/2023)   Depression (PHQ2-9)    PHQ-2 Score: 0  Alcohol Screen: Not on file  Housing: Unknown (11/06/2023)   Epic    Unable to Pay for Housing in the Last Year: No    Number of Times Moved in the Last Year: Not on file    Homeless in the Last Year: No  Utilities: Not At Risk (11/06/2023)   Epic    Threatened with loss of utilities: No  Health Literacy: Not on file    FAMILY HISTORY: No family history on file.  ALLERGIES:  is allergic to amoxicillin-pot clavulanate and codeine.  MEDICATIONS:  Current Outpatient Medications  Medication Sig Dispense Refill   amiodarone  (PACERONE ) 200 MG tablet Take 100 mg by mouth daily.     Cholecalciferol (VITAMIN D3) 1000 units CAPS Take 1,000 Units by mouth daily.     doxycycline  (VIBRA -TABS) 100 MG tablet Take 100 mg by mouth See admin instructions. Take 100 mg by mouth 30-60 minutes prior to dental procedures     empagliflozin  (JARDIANCE ) 10 MG TABS tablet Take 1 tablet (10 mg total) by mouth daily. 90 tablet 3   ferrous sulfate 325 (65 FE) MG tablet Take 325 mg by mouth daily with breakfast.     furosemide  (LASIX ) 20 MG tablet Take 20 mg by mouth daily.     IMODIUM A-D 2 MG tablet Take 2 mg by mouth 4 (four) times daily as needed for diarrhea or loose stools.     levETIRAcetam  (KEPPRA ) 500 MG tablet Take 1 tablet in AM and 1 and 1/2 tablets in PM 225 tablet 3   magnesium  oxide (MAG-OX) 400 (240 Mg) MG tablet Take 400 mg by mouth daily.     ondansetron  (ZOFRAN ) 4 MG tablet Take 4 mg by mouth  every 8 (eight) hours as needed.     predniSONE  (DELTASONE ) 20 MG tablet TAKE 3 TABLETS (60 MG TOTAL) BY MOUTH DAILY WITH BREAKFAST. 63 tablet 1   rosuvastatin  (CRESTOR ) 20 MG tablet Take 1 tablet (20 mg total) by mouth daily. 90 tablet 3   TYLENOL  500 MG tablet Take 500 mg by mouth every 6 (six) hours as needed for mild pain (pain score 1-3) or headache.     warfarin (COUMADIN ) 6 MG tablet Take 3-6 mg by mouth See admin instructions. Take 6 mg by mouth in the morning on Sun/Mon/Wed/Thurs/Fri/Sat and 3 mg on Tues (Patient taking differently: Take 2-4 mg by mouth See admin instructions.  Take 6 mg by mouth in the morning on Sun/Mon/Wed/Thurs/Fri/Sat and 3 mg on Tues)     No current facility-administered medications for this visit.    REVIEW OF SYSTEMS:   Constitutional: ( - ) fevers, ( - )  chills , ( - ) night sweats Eyes: ( - ) blurriness of vision, ( - ) double vision, ( - ) watery eyes Ears, nose, mouth, throat, and face: ( - ) mucositis, ( - ) sore throat Respiratory: ( - ) cough, ( - ) dyspnea, ( - ) wheezes Cardiovascular: ( - ) palpitation, ( - ) chest discomfort, ( - ) lower extremity swelling Gastrointestinal:  ( - ) nausea, ( - ) heartburn, ( - ) change in bowel habits Skin: ( - ) abnormal skin rashes Lymphatics: ( - ) new lymphadenopathy, ( - ) easy bruising Neurological: ( - ) numbness, ( - ) tingling, ( - ) new weaknesses Behavioral/Psych: ( - ) mood change, ( - ) new changes  All other systems were reviewed with the patient and are negative.  PHYSICAL EXAMINATION:  There were no vitals filed for this visit. There were no vitals filed for this visit.  GENERAL: Well-appearing elderly Caucasian male, alert, no distress and comfortable SKIN: skin color, texture, turgor are normal, no rashes or significant lesions EYES: conjunctiva are pink and non-injected, sclera clear LUNGS: clear to auscultation and percussion with normal breathing effort HEART: regular rate & rhythm and no  murmurs and no lower extremity edema Musculoskeletal: no cyanosis of digits and no clubbing  PSYCH: alert & oriented x 3, fluent speech NEURO: no focal motor/sensory deficits  LABORATORY DATA:  I have reviewed the data as listed    Latest Ref Rng & Units 04/10/2024   10:38 AM 03/25/2024    9:13 AM 03/10/2024    8:26 AM  CBC  WBC 4.0 - 10.5 K/uL 16.0  15.9  20.4   Hemoglobin 13.0 - 17.0 g/dL 84.6  84.8  84.4   Hematocrit 39.0 - 52.0 % 47.2  46.6  47.8   Platelets 150 - 400 K/uL 87  60  84        Latest Ref Rng & Units 04/10/2024   10:38 AM 03/25/2024    9:13 AM 03/10/2024    8:26 AM  CMP  Glucose 70 - 99 mg/dL 863  74  93   BUN 8 - 23 mg/dL 23  21  22    Creatinine 0.61 - 1.24 mg/dL 8.52  8.75  8.70   Sodium 135 - 145 mmol/L 136  138  136   Potassium 3.5 - 5.1 mmol/L 4.4  4.0  4.0   Chloride 98 - 111 mmol/L 99  102  98   CO2 22 - 32 mmol/L 26  28  28    Calcium  8.9 - 10.3 mg/dL 8.9  9.4  9.4   Total Protein 6.5 - 8.1 g/dL 6.9  6.9  7.8   Total Bilirubin 0.0 - 1.2 mg/dL 1.5  1.3  1.6   Alkaline Phos 38 - 126 U/L 92  81  103   AST 15 - 41 U/L 44  35  41   ALT 0 - 44 U/L 73  55  56     Lab Results  Component Value Date   MPROTEIN Not Observed 11/06/2023   Lab Results  Component Value Date   KPAFRELGTCHN 46.6 (H) 11/06/2023   LAMBDASER 33.0 (H) 11/06/2023   KAPLAMBRATIO 1.41 11/06/2023    RADIOGRAPHIC STUDIES:  No results found.   ASSESSMENT & PLAN Alan Willis 79 y.o. male with medical history significant for thrombocytopenia and normocytic anemia who presents for a follow up visit.  Preliminary results of the bone marrow biopsy were available today.  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  Etiology at this time is unclear.  Patient tested negative for tuberculosis.  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  In the event that the further workup with a bone marrow biopsy does not reveal a clear etiology for his  findings I would recommend a trial of steroid pulse.  The patient voiced understanding of our findings and plan moving forward.   #Thrombocytopenia--stable #Normocytic anemia-resolved -- Labs today show white blood cell ***.  Creatinine *** --Bone marrow biopsy performed on 12/09/2023 showed evidence of granulomas and inflammation, etiology possibly sarcoidosis vs inflammatory disorder. No evidence of malignancy.  -- Because there is no clear etiology identified on CT scan we are pursuing steroid therapy.  --Patient voiced understanding of our findings and plan moving forward. --Started at prednisone  80 mg p.o. daily, dose currently at 40 mg p.o. daily.  Will drop dose to 20 mg p.o. daily x 2 weeks with consideration of dropping down to 10 mg in 2 weeks if labs continue to improve.  --Due to drop in platelets recommend patient continue 40 mg p.o. daily. --No other clear etiology was noted on our blood work.  Nutritional levels within normal limits with no other concerning abnormalities. --Return to clinic in 2 weeks for labs in 4 weeks for clinic visit/labs     No orders of the defined types were placed in this encounter.   All questions were answered. The patient knows to call the clinic with any problems, questions or concerns.  A total of more than 30 minutes were spent on this encounter with face-to-face time and non-face-to-face time, including preparing to see the patient, ordering tests and/or medications, counseling the patient and coordination of care as outlined above.   Alan IVAR Kidney, MD Department of Hematology/Oncology [facility] Phone: [phone] Pager: [phone] Email: [email or web address]  04/21/2024 9:15 AM  "

## 2024-04-22 ENCOUNTER — Inpatient Hospital Stay: Admitting: Hematology and Oncology

## 2024-04-22 ENCOUNTER — Inpatient Hospital Stay: Attending: Physician Assistant

## 2024-04-22 VITALS — BP 115/89 | HR 68 | Temp 98.3°F | Resp 18 | Ht 70.0 in | Wt 173.3 lb

## 2024-04-22 DIAGNOSIS — Z7952 Long term (current) use of systemic steroids: Secondary | ICD-10-CM | POA: Diagnosis not present

## 2024-04-22 DIAGNOSIS — F17290 Nicotine dependence, other tobacco product, uncomplicated: Secondary | ICD-10-CM | POA: Diagnosis not present

## 2024-04-22 DIAGNOSIS — D696 Thrombocytopenia, unspecified: Secondary | ICD-10-CM | POA: Insufficient documentation

## 2024-04-22 DIAGNOSIS — D869 Sarcoidosis, unspecified: Secondary | ICD-10-CM | POA: Diagnosis not present

## 2024-04-22 DIAGNOSIS — D649 Anemia, unspecified: Secondary | ICD-10-CM | POA: Insufficient documentation

## 2024-04-22 LAB — CBC WITH DIFFERENTIAL (CANCER CENTER ONLY)
Abs Immature Granulocytes: 0.16 K/uL — ABNORMAL HIGH (ref 0.00–0.07)
Basophils Absolute: 0 K/uL (ref 0.0–0.1)
Basophils Relative: 0 %
Eosinophils Absolute: 0 K/uL (ref 0.0–0.5)
Eosinophils Relative: 0 %
HCT: 47.6 % (ref 39.0–52.0)
Hemoglobin: 15.6 g/dL (ref 13.0–17.0)
Immature Granulocytes: 1 %
Lymphocytes Relative: 6 %
Lymphs Abs: 0.7 K/uL (ref 0.7–4.0)
MCH: 30.1 pg (ref 26.0–34.0)
MCHC: 32.8 g/dL (ref 30.0–36.0)
MCV: 91.7 fL (ref 80.0–100.0)
Monocytes Absolute: 0.3 K/uL (ref 0.1–1.0)
Monocytes Relative: 2 %
Neutro Abs: 11.7 K/uL — ABNORMAL HIGH (ref 1.7–7.7)
Neutrophils Relative %: 91 %
Platelet Count: 86 K/uL — ABNORMAL LOW (ref 150–400)
RBC: 5.19 MIL/uL (ref 4.22–5.81)
RDW: 18.1 % — ABNORMAL HIGH (ref 11.5–15.5)
WBC Count: 12.9 K/uL — ABNORMAL HIGH (ref 4.0–10.5)
nRBC: 0 % (ref 0.0–0.2)

## 2024-04-22 LAB — CMP (CANCER CENTER ONLY)
ALT: 90 U/L — ABNORMAL HIGH (ref 0–44)
AST: 45 U/L — ABNORMAL HIGH (ref 15–41)
Albumin: 4.3 g/dL (ref 3.5–5.0)
Alkaline Phosphatase: 91 U/L (ref 38–126)
Anion gap: 10 (ref 5–15)
BUN: 22 mg/dL (ref 8–23)
CO2: 27 mmol/L (ref 22–32)
Calcium: 9.2 mg/dL (ref 8.9–10.3)
Chloride: 99 mmol/L (ref 98–111)
Creatinine: 1.5 mg/dL — ABNORMAL HIGH (ref 0.61–1.24)
GFR, Estimated: 48 mL/min — ABNORMAL LOW
Glucose, Bld: 131 mg/dL — ABNORMAL HIGH (ref 70–99)
Potassium: 4.5 mmol/L (ref 3.5–5.1)
Sodium: 136 mmol/L (ref 135–145)
Total Bilirubin: 2.1 mg/dL — ABNORMAL HIGH (ref 0.0–1.2)
Total Protein: 6.9 g/dL (ref 6.5–8.1)

## 2024-04-22 LAB — LACTATE DEHYDROGENASE: LDH: 356 U/L — ABNORMAL HIGH (ref 105–235)

## 2024-04-22 LAB — IMMATURE PLATELET FRACTION: Immature Platelet Fraction: 4.8 % (ref 1.2–8.6)

## 2024-05-04 ENCOUNTER — Emergency Department (HOSPITAL_BASED_OUTPATIENT_CLINIC_OR_DEPARTMENT_OTHER)

## 2024-05-04 ENCOUNTER — Inpatient Hospital Stay (HOSPITAL_BASED_OUTPATIENT_CLINIC_OR_DEPARTMENT_OTHER)
Admission: EM | Admit: 2024-05-04 | Discharge: 2024-05-08 | DRG: 871 | Disposition: A | Discharge status: Home / Self Care | Attending: Internal Medicine | Admitting: Internal Medicine

## 2024-05-04 ENCOUNTER — Encounter (HOSPITAL_COMMUNITY): Payer: Self-pay | Admitting: Internal Medicine

## 2024-05-04 ENCOUNTER — Other Ambulatory Visit: Payer: Self-pay

## 2024-05-04 DIAGNOSIS — I471 Supraventricular tachycardia, unspecified: Secondary | ICD-10-CM | POA: Diagnosis present

## 2024-05-04 DIAGNOSIS — I4891 Unspecified atrial fibrillation: Secondary | ICD-10-CM | POA: Diagnosis not present

## 2024-05-04 DIAGNOSIS — Z66 Do not resuscitate: Secondary | ICD-10-CM | POA: Diagnosis present

## 2024-05-04 DIAGNOSIS — F32A Depression, unspecified: Secondary | ICD-10-CM | POA: Diagnosis present

## 2024-05-04 DIAGNOSIS — A419 Sepsis, unspecified organism: Principal | ICD-10-CM | POA: Diagnosis present

## 2024-05-04 DIAGNOSIS — G40909 Epilepsy, unspecified, not intractable, without status epilepticus: Secondary | ICD-10-CM | POA: Diagnosis present

## 2024-05-04 DIAGNOSIS — Z8719 Personal history of other diseases of the digestive system: Secondary | ICD-10-CM

## 2024-05-04 DIAGNOSIS — I509 Heart failure, unspecified: Secondary | ICD-10-CM

## 2024-05-04 DIAGNOSIS — I5022 Chronic systolic (congestive) heart failure: Secondary | ICD-10-CM | POA: Insufficient documentation

## 2024-05-04 DIAGNOSIS — Z952 Presence of prosthetic heart valve: Secondary | ICD-10-CM

## 2024-05-04 DIAGNOSIS — I050 Rheumatic mitral stenosis: Secondary | ICD-10-CM | POA: Diagnosis not present

## 2024-05-04 DIAGNOSIS — K56609 Unspecified intestinal obstruction, unspecified as to partial versus complete obstruction: Secondary | ICD-10-CM

## 2024-05-04 DIAGNOSIS — N179 Acute kidney failure, unspecified: Secondary | ICD-10-CM | POA: Diagnosis present

## 2024-05-04 DIAGNOSIS — Z7984 Long term (current) use of oral hypoglycemic drugs: Secondary | ICD-10-CM

## 2024-05-04 DIAGNOSIS — Z9049 Acquired absence of other specified parts of digestive tract: Secondary | ICD-10-CM

## 2024-05-04 DIAGNOSIS — J188 Other pneumonia, unspecified organism: Secondary | ICD-10-CM

## 2024-05-04 DIAGNOSIS — I052 Rheumatic mitral stenosis with insufficiency: Secondary | ICD-10-CM | POA: Diagnosis present

## 2024-05-04 DIAGNOSIS — Z7901 Long term (current) use of anticoagulants: Secondary | ICD-10-CM | POA: Diagnosis not present

## 2024-05-04 DIAGNOSIS — F17290 Nicotine dependence, other tobacco product, uncomplicated: Secondary | ICD-10-CM | POA: Diagnosis present

## 2024-05-04 DIAGNOSIS — I342 Nonrheumatic mitral (valve) stenosis: Secondary | ICD-10-CM | POA: Diagnosis not present

## 2024-05-04 DIAGNOSIS — Z515 Encounter for palliative care: Secondary | ICD-10-CM

## 2024-05-04 DIAGNOSIS — I480 Paroxysmal atrial fibrillation: Secondary | ICD-10-CM | POA: Diagnosis present

## 2024-05-04 DIAGNOSIS — J189 Pneumonia, unspecified organism: Secondary | ICD-10-CM | POA: Diagnosis present

## 2024-05-04 DIAGNOSIS — Z7952 Long term (current) use of systemic steroids: Secondary | ICD-10-CM

## 2024-05-04 DIAGNOSIS — T380X5A Adverse effect of glucocorticoids and synthetic analogues, initial encounter: Secondary | ICD-10-CM | POA: Diagnosis present

## 2024-05-04 DIAGNOSIS — D869 Sarcoidosis, unspecified: Secondary | ICD-10-CM | POA: Diagnosis present

## 2024-05-04 DIAGNOSIS — J9601 Acute respiratory failure with hypoxia: Secondary | ICD-10-CM | POA: Diagnosis present

## 2024-05-04 DIAGNOSIS — R040 Epistaxis: Secondary | ICD-10-CM | POA: Insufficient documentation

## 2024-05-04 DIAGNOSIS — K565 Intestinal adhesions [bands], unspecified as to partial versus complete obstruction: Secondary | ICD-10-CM | POA: Diagnosis present

## 2024-05-04 DIAGNOSIS — Z880 Allergy status to penicillin: Secondary | ICD-10-CM

## 2024-05-04 DIAGNOSIS — I4589 Other specified conduction disorders: Secondary | ICD-10-CM | POA: Diagnosis present

## 2024-05-04 DIAGNOSIS — Z885 Allergy status to narcotic agent status: Secondary | ICD-10-CM

## 2024-05-04 DIAGNOSIS — R1310 Dysphagia, unspecified: Secondary | ICD-10-CM | POA: Diagnosis present

## 2024-05-04 DIAGNOSIS — D696 Thrombocytopenia, unspecified: Secondary | ICD-10-CM | POA: Diagnosis present

## 2024-05-04 DIAGNOSIS — D6489 Other specified anemias: Secondary | ICD-10-CM | POA: Insufficient documentation

## 2024-05-04 DIAGNOSIS — N1831 Chronic kidney disease, stage 3a: Secondary | ICD-10-CM | POA: Diagnosis present

## 2024-05-04 DIAGNOSIS — I5023 Acute on chronic systolic (congestive) heart failure: Secondary | ICD-10-CM | POA: Diagnosis present

## 2024-05-04 DIAGNOSIS — Z79899 Other long term (current) drug therapy: Secondary | ICD-10-CM

## 2024-05-04 DIAGNOSIS — I4892 Unspecified atrial flutter: Secondary | ICD-10-CM | POA: Diagnosis present

## 2024-05-04 DIAGNOSIS — Z881 Allergy status to other antibiotic agents status: Secondary | ICD-10-CM

## 2024-05-04 DIAGNOSIS — I2489 Other forms of acute ischemic heart disease: Secondary | ICD-10-CM | POA: Diagnosis present

## 2024-05-04 DIAGNOSIS — Z1152 Encounter for screening for COVID-19: Secondary | ICD-10-CM

## 2024-05-04 DIAGNOSIS — R6520 Severe sepsis without septic shock: Secondary | ICD-10-CM | POA: Diagnosis present

## 2024-05-04 DIAGNOSIS — R569 Unspecified convulsions: Secondary | ICD-10-CM | POA: Insufficient documentation

## 2024-05-04 DIAGNOSIS — I493 Ventricular premature depolarization: Secondary | ICD-10-CM | POA: Diagnosis present

## 2024-05-04 LAB — URINALYSIS, W/ REFLEX TO CULTURE (INFECTION SUSPECTED)
Bacteria, UA: NONE SEEN
Bilirubin Urine: NEGATIVE
Glucose, UA: >1000 mg/dL — AB
Hgb urine dipstick: NEGATIVE
Ketones, ur: NEGATIVE mg/dL
Leukocytes,Ua: NEGATIVE
Nitrite: NEGATIVE
Specific Gravity, Urine: 1.026 (ref 1.005–1.030)
pH: 6 (ref 5.0–8.0)

## 2024-05-04 LAB — RESP PANEL BY RT-PCR (RSV, FLU A&B, COVID)  RVPGX2
Influenza A by PCR: NEGATIVE
Influenza B by PCR: NEGATIVE
Resp Syncytial Virus by PCR: NEGATIVE
SARS Coronavirus 2 by RT PCR: NEGATIVE

## 2024-05-04 LAB — CBC WITH DIFFERENTIAL/PLATELET
Abs Immature Granulocytes: 0.2 K/uL — ABNORMAL HIGH (ref 0.00–0.07)
Basophils Absolute: 0.1 K/uL (ref 0.0–0.1)
Basophils Relative: 0 %
Eosinophils Absolute: 0 K/uL (ref 0.0–0.5)
Eosinophils Relative: 0 %
HCT: 49.8 % (ref 39.0–52.0)
Hemoglobin: 16.5 g/dL (ref 13.0–17.0)
Immature Granulocytes: 1 %
Lymphocytes Relative: 3 %
Lymphs Abs: 1 K/uL (ref 0.7–4.0)
MCH: 30.1 pg (ref 26.0–34.0)
MCHC: 33.1 g/dL (ref 30.0–36.0)
MCV: 90.9 fL (ref 80.0–100.0)
Monocytes Absolute: 1.6 K/uL — ABNORMAL HIGH (ref 0.1–1.0)
Monocytes Relative: 6 %
Neutro Abs: 25.3 K/uL — ABNORMAL HIGH (ref 1.7–7.7)
Neutrophils Relative %: 90 %
Platelets: 139 K/uL — ABNORMAL LOW (ref 150–400)
RBC: 5.48 MIL/uL (ref 4.22–5.81)
RDW: 18.9 % — ABNORMAL HIGH (ref 11.5–15.5)
Smear Review: NORMAL
WBC: 28.1 K/uL — ABNORMAL HIGH (ref 4.0–10.5)
nRBC: 0 % (ref 0.0–0.2)

## 2024-05-04 LAB — BASIC METABOLIC PANEL WITH GFR
Anion gap: 16 — ABNORMAL HIGH (ref 5–15)
BUN: 20 mg/dL (ref 8–23)
CO2: 25 mmol/L (ref 22–32)
Calcium: 9.9 mg/dL (ref 8.9–10.3)
Chloride: 99 mmol/L (ref 98–111)
Creatinine, Ser: 1.34 mg/dL — ABNORMAL HIGH (ref 0.61–1.24)
GFR, Estimated: 55 mL/min — ABNORMAL LOW
Glucose, Bld: 120 mg/dL — ABNORMAL HIGH (ref 70–99)
Potassium: 3.9 mmol/L (ref 3.5–5.1)
Sodium: 140 mmol/L (ref 135–145)

## 2024-05-04 LAB — D-DIMER, QUANTITATIVE: D-Dimer, Quant: 0.68 ug{FEU}/mL — ABNORMAL HIGH (ref 0.00–0.50)

## 2024-05-04 LAB — PROTIME-INR
INR: 2.3 — ABNORMAL HIGH (ref 0.8–1.2)
Prothrombin Time: 26 s — ABNORMAL HIGH (ref 11.4–15.2)

## 2024-05-04 LAB — PRO BRAIN NATRIURETIC PEPTIDE: Pro Brain Natriuretic Peptide: 4570 pg/mL — ABNORMAL HIGH

## 2024-05-04 LAB — LACTIC ACID, PLASMA
Lactic Acid, Venous: 2 mmol/L (ref 0.5–1.9)
Lactic Acid, Venous: 1.8 mmol/L (ref 0.5–1.9)

## 2024-05-04 LAB — TROPONIN T, HIGH SENSITIVITY: Troponin T High Sensitivity: 86 ng/L — ABNORMAL HIGH (ref 0–19)

## 2024-05-04 MED ORDER — AMIODARONE HCL IN DEXTROSE 360-4.14 MG/200ML-% IV SOLN
30.0000 mg/h | INTRAVENOUS | Status: DC
  Filled 2024-05-04: qty 200

## 2024-05-04 MED ORDER — FENTANYL CITRATE (PF) 50 MCG/ML IJ SOSY
50.0000 ug | PREFILLED_SYRINGE | Freq: Once | INTRAMUSCULAR | Status: AC
  Administered 2024-05-04: 50 ug via INTRAVENOUS
  Filled 2024-05-04: qty 1

## 2024-05-04 MED ORDER — DEXTROSE 5 % IV SOLN
30.0000 mg/h | INTRAVENOUS | Status: AC
  Administered 2024-05-05 – 2024-05-06 (×3): 30 mg/h via INTRAVENOUS
  Filled 2024-05-04: qty 450
  Filled 2024-05-04: qty 9
  Filled 2024-05-04 (×2): qty 450

## 2024-05-04 MED ORDER — AMIODARONE LOAD VIA INFUSION
150.0000 mg | Freq: Once | INTRAVENOUS | Status: AC
  Administered 2024-05-04: 150 mg via INTRAVENOUS
  Filled 2024-05-04: qty 83.34

## 2024-05-04 MED ORDER — DILTIAZEM HCL 25 MG/5ML IV SOLN
10.0000 mg | Freq: Once | INTRAVENOUS | Status: AC
  Administered 2024-05-04: 10 mg via INTRAVENOUS
  Filled 2024-05-04: qty 5

## 2024-05-04 MED ORDER — LACTATED RINGERS IV BOLUS
1000.0000 mL | Freq: Once | INTRAVENOUS | Status: AC
  Administered 2024-05-04: 1000 mL via INTRAVENOUS

## 2024-05-04 MED ORDER — ACETAMINOPHEN 650 MG RE SUPP
650.0000 mg | Freq: Once | RECTAL | Status: AC
  Administered 2024-05-04: 650 mg via RECTAL
  Filled 2024-05-04: qty 1

## 2024-05-04 MED ORDER — IOHEXOL 350 MG/ML SOLN
100.0000 mL | Freq: Once | INTRAVENOUS | Status: AC | PRN
  Administered 2024-05-04: 100 mL via INTRAVENOUS

## 2024-05-04 MED ORDER — AZITHROMYCIN 250 MG PO TABS
500.0000 mg | ORAL_TABLET | Freq: Every day | ORAL | Status: DC
  Administered 2024-05-04: 500 mg via ORAL
  Filled 2024-05-04: qty 2

## 2024-05-04 MED ORDER — FUROSEMIDE 10 MG/ML IJ SOLN
40.0000 mg | Freq: Once | INTRAMUSCULAR | Status: AC
  Administered 2024-05-04: 40 mg via INTRAVENOUS
  Filled 2024-05-04: qty 4

## 2024-05-04 MED ORDER — AMIODARONE HCL IN DEXTROSE 360-4.14 MG/200ML-% IV SOLN
60.0000 mg/h | INTRAVENOUS | Status: AC
  Administered 2024-05-04: 60 mg/h via INTRAVENOUS
  Filled 2024-05-04: qty 200

## 2024-05-04 MED ORDER — SODIUM CHLORIDE 0.9 % IV SOLN
2.0000 g | Freq: Once | INTRAVENOUS | Status: AC
  Administered 2024-05-04: 2 g via INTRAVENOUS
  Filled 2024-05-04: qty 20

## 2024-05-04 NOTE — ED Provider Notes (Addendum)
 Patient is holding in the ED pending admission.  Notified that his heart rate is in the 130s.  Patient is being admitted for high-grade bowel obstruction.  Patient also has evidence of pneumonia.  EKG shows atrial flutter with 2-1 block.  Will give dose of Cardizem  and continue to monitor closely   Randol Simmonds, MD 05/04/24 1913  HR still eelvated.  BP soft.  Will start amiodarone    Randol Simmonds, MD 05/04/24 1945

## 2024-05-04 NOTE — ED Notes (Signed)
 Patient transported to CT

## 2024-05-04 NOTE — ED Provider Notes (Signed)
 " [place] EMERGENCY DEPARTMENT AT [facility] Provider Note   CSN: [id number] Arrival date & time: 05/04/24  1005     Patient presents with: Shortness of Breath and Abdominal Pain   Alan Willis is a 79 y.o. male.    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  The patient denies any chest pain.  Has had some abdominal discomfort, associated nausea.  He has had some increased swelling in the bilateral legs and ankles, some asymmetry noted. He additionally has had some lower abdominal discomfort, described as in the suprapubic/periumbilical region, he is moving his bowels last bowel movement today and was normal and he is passing gas, no genitourinary symptoms. On arrival to the emergency department, the patient was tachycardic, tachypneic and saturating in the 70s on room air and subsequently placed on oxygen.    Prior to Admission medications  Medication Sig Start Date End Date Taking? Authorizing Provider  amiodarone  (PACERONE ) 200 MG tablet Take 100 mg by mouth daily.   Yes [provider]  empagliflozin  (JARDIANCE ) 10 MG TABS tablet Take 1 tablet (10 mg total) by mouth daily. 05/13/23  Yes Pietro Redell RAMAN, MD  furosemide  (LASIX ) 20 MG tablet Take 20 mg by mouth daily. 03/20/23  Yes [provider]  levETIRAcetam  (KEPPRA ) 500 MG tablet Take 1 tablet in AM and 1 and 1/2 tablets in PM 01/31/24  Yes Georjean Darice CHRISTELLA, MD  rosuvastatin  (CRESTOR ) 20 MG tablet Take 1 tablet  (20 mg total) by mouth daily. 09/17/23  Yes Pietro Redell RAMAN, MD  warfarin (COUMADIN ) 6 MG tablet Take 3-6 mg by mouth See admin instructions. Take 6 mg by mouth in the morning on Sun/Mon/Wed/Thurs/Fri/Sat and 3 mg on Tues Patient taking differently: Take 2-4 mg by mouth See admin instructions. Take 6 mg by mouth in the morning on Sun/Mon/Wed/Thurs/Fri/Sat and 3 mg on Tues   Yes [provider]  Cholecalciferol (VITAMIN D3) 1000 units CAPS Take 1,000 Units by mouth daily.    [provider]  ferrous sulfate 325 (65 FE) MG tablet Take 325 mg by mouth daily with breakfast.    [provider]  IMODIUM A-D 2 MG tablet Take 2 mg by mouth 4 (four) times daily as needed for diarrhea or loose stools.    [provider]  magnesium  oxide (MAG-OX) 400 (240 Mg) MG tablet Take 400 mg by mouth daily.    [provider]  ondansetron  (ZOFRAN ) 4 MG tablet Take 4 mg by mouth every 8 (eight) hours as needed. 04/12/23   [provider]  predniSONE  (DELTASONE ) 20 MG tablet TAKE 3 TABLETS (60 MG TOTAL) BY MOUTH DAILY WITH BREAKFAST. 03/27/24   Federico Norleen ONEIDA MADISON, MD  TYLENOL  500 MG tablet Take 500 mg by mouth every 6 (six) hours as needed for mild pain (pain score 1-3) or headache.    [provider]    Allergies: Amoxicillin-pot clavulanate and Codeine    Review of Systems  Respiratory:  Positive for cough and shortness of breath.   Gastrointestinal:  Positive for abdominal pain.  All other systems reviewed and are negative.   Updated Vital Signs BP 102/78   Pulse 74   Temp 98.5 F (36.9 C)   Resp (!) 35   SpO2 95%   Physical Exam Vitals and nursing note reviewed.  Constitutional:      General: He is not in acute distress.    Appearance: He is well-developed.  HENT:     Head: Normocephalic and atraumatic.  Eyes:     Conjunctiva/sclera: Conjunctivae normal.  Cardiovascular:     Rate and Rhythm: Normal rate and regular rhythm.     Heart  sounds: No murmur heard. Pulmonary:     Effort: Pulmonary effort is normal. Tachypnea present. No respiratory distress.     Breath sounds: Examination of the right-lower field reveals rales. Examination of the left-lower field reveals rales. Rales present.  Abdominal:     Palpations: Abdomen is soft.     Tenderness: There is generalized abdominal tenderness. There is no guarding.  Musculoskeletal:        General: No swelling.     Cervical back: Neck supple.     Right lower leg: Edema present.     Left lower leg: Edema present.  Skin:    General: Skin is warm and dry.     Capillary Refill: Capillary refill takes less than 2 seconds.  Neurological:     Mental Status: He is alert.  Psychiatric:        Mood and Affect: Mood normal.     (all labs ordered are listed, but only abnormal results are displayed) Labs Reviewed  BASIC METABOLIC PANEL WITH GFR - Abnormal; Notable for the following components:      Result Value   Glucose, Bld 120 (*)    Creatinine, Ser 1.34 (*)    GFR, Estimated 55 (*)    Anion gap 16 (*)    All other components within normal limits  CBC WITH DIFFERENTIAL/PLATELET - Abnormal; Notable for the following components:   WBC 28.1 (*)    RDW 18.9 (*)    Platelets 139 (*)    Neutro Abs 25.3 (*)    Monocytes Absolute 1.6 (*)    Abs Immature Granulocytes 0.20 (*)    All other components within normal limits  PRO BRAIN NATRIURETIC PEPTIDE - Abnormal; Notable for the following components:   Pro Brain Natriuretic Peptide 4,570.0 (*)    All other components within normal limits  D-DIMER, QUANTITATIVE - Abnormal; Notable for the following components:   D-Dimer, Quant 0.68 (*)    All other components within normal limits  PROTIME-INR - Abnormal; Notable for the following components:   Prothrombin Time 26.0 (*)    INR 2.3 (*)    All other components within normal limits  URINALYSIS, W/ REFLEX TO CULTURE (INFECTION SUSPECTED) - Abnormal; Notable for the following  components:   Glucose, UA >1,000 (*)    Protein, ur TRACE (*)    All other components within normal limits  TROPONIN T, HIGH SENSITIVITY - Abnormal; Notable for the following components:   Troponin T High Sensitivity 86 (*)    All other components within normal limits  RESP PANEL BY RT-PCR (RSV, FLU A&B, COVID)  RVPGX2  CULTURE, BLOOD (ROUTINE X 2)  CULTURE, BLOOD (ROUTINE X 2)  LACTIC ACID, PLASMA  LACTIC ACID, PLASMA    EKG: EKG Interpretation Date/Time:  Monday May 04 2024 10:21:37 EST Ventricular Rate:  139 PR Interval:  124 QRS Duration:  118 QT Interval:  332 QTC  Calculation: 505 R Axis:   -76  Text Interpretation: Sinus tachycardia Left anterior fascicular block Left ventricular hypertrophy with QRS widening ( Cornell product , Romhilt-Estes ) Abnormal ECG When compared with ECG of 17-Sep-2023 07:50, Sinus rhythm has replaced Atrial fibrillation Vent. rate has increased BY  92 BPM QRS duration has decreased ST elevation now present in Inferior leads Nonspecific T wave abnormality no longer evident in Inferior leads Confirmed by Jerrol Agent ([id number]) on 05/04/2024 12:02:09 PM  Radiology: CT Angio Chest PE W and/or Wo Contrast Result Date: 05/04/2024 CLINICAL DATA:  Pulmonary embolism suspected, high probability. Abdominal pain, nausea, difficulty breathing, cough. Leg and ankle swelling. Low O2 sat. EXAM: CT ANGIOGRAPHY CHEST CT ABDOMEN AND PELVIS WITH CONTRAST TECHNIQUE: Multidetector CT imaging of the chest was performed using the standard protocol during bolus administration of intravenous contrast. Multiplanar CT image reconstructions and MIPs were obtained to evaluate the vascular anatomy. Multidetector CT imaging of the abdomen and pelvis was performed using the standard protocol during bolus administration of intravenous contrast. RADIATION DOSE REDUCTION: This exam was performed according to the departmental dose-optimization program which includes automated exposure  control, adjustment of the mA and/or kV according to patient size and/or use of iterative reconstruction technique. CONTRAST:  OMNIPAQUE  IOHEXOL  350 MG/ML SOLN COMPARISON:  CT chest 11/05/2023 and CT abdomen pelvis 01/02/2024. FINDINGS: CTA CHEST FINDINGS Cardiovascular: Image quality is degraded by respiratory motion. Negative for pulmonary embolus. Atherosclerotic calcification of the aorta and coronary arteries. Aortic valve replacement. Mitral annulus calcification. Enlarged pulmonic trunk and heart. No pericardial effusion. Ascending aorta measures 3.8 cm (7/89). Mediastinum/Nodes: Thoracic inlet lymph nodes are not enlarged by CT size criteria. Mediastinal lymph nodes measure up to 1.7 cm in the low right paratracheal station, minimally larger but present dating back to 04/14/2020. 1.6 cm right hilar lymph node. No left hilar or axillary adenopathy. Air in fluid in the esophagus are indicative of dysmotility. Lungs/Pleura: Image quality is degraded by expiratory phase imaging, creating added density in the lungs. Patchy rounded consolidation in the right upper lobe. Additional rounded areas of consolidation in the left lower lobe. Nodule in the inferior posterolateral left lower lobe is seen along the left hemidiaphragm and measures 1.3 x 1.6 cm (6/100), unchanged from 04/14/2020 and considered benign. No pleural fluid. Airway is unremarkable. Musculoskeletal: Degenerative changes in the spine. Chronic mild compression of the T4 and T5 superior endplates. Review of the MIP images confirms the above findings. CT ABDOMEN and PELVIS FINDINGS Hepatobiliary: Liver is enlarged, 19.9 cm. Gallstone. No biliary ductal dilatation. Pancreas: Negative. Spleen: Enlarged, 15.2 cm. Adrenals/Urinary Tract: Adrenal glands are unremarkable. Low-attenuation lesions in the kidneys. No specific follow-up necessary. Kidneys are otherwise unremarkable. Ureters are decompressed. Bladder is grossly unremarkable. Stomach/Bowel:  Stomach is distended with fluid. Dilated and fluid-filled jejunum with a transition point in the ventral midline low abdomen (2/51). Remainder of the small bowel is acutely decompressed. There is unobstructed small bowel extending into a right inguinal hernia. Appendix and colon are unremarkable. Vascular/Lymphatic: Atherosclerotic calcification of the aorta. No pathologically enlarged lymph nodes. Reproductive: Prostate is slightly enlarged. Other: Bilateral inguinal hernia repair. Large right inguinal hernia contains unobstructed small bowel. Small left inguinal hernia contains fat. Trace interloop fluid. No mesenteric edema. Musculoskeletal: Degenerative changes in the spine. Review of the MIP images confirms the above findings. IMPRESSION: 1. Negative for pulmonary embolus. 2. High-grade small bowel obstruction with transition point in the low ventral midline abdomen. Minimal associated interloop fluid. No mesenteric edema. 3. Right  upper and left lower lobe pneumonia. Associated enlargement of mediastinal lymph nodes, likely reactive in etiology. 4. Bilateral inguinal hernia repair with a large right inguinal hernia containing unobstructed small bowel and a small left inguinal hernia contains fat. 5. Mild hepatosplenomegaly, improved from 01/02/2024. 6. Cholelithiasis. 7. Mildly enlarged prostate. 8. Aortic atherosclerosis (ICD10-I70.0). Coronary artery calcification. 9. Enlarged pulmonic trunk, indicative of pulmonary arterial hypertension. Electronically Signed   By: Newell Eke M.D.   On: 05/04/2024 13:29   CT ABDOMEN PELVIS W CONTRAST Result Date: 05/04/2024 CLINICAL DATA:  Pulmonary embolism suspected, high probability. Abdominal pain, nausea, difficulty breathing, cough. Leg and ankle swelling. Low O2 sat. EXAM: CT ANGIOGRAPHY CHEST CT ABDOMEN AND PELVIS WITH CONTRAST TECHNIQUE: Multidetector CT imaging of the chest was performed using the standard protocol during bolus administration of  intravenous contrast. Multiplanar CT image reconstructions and MIPs were obtained to evaluate the vascular anatomy. Multidetector CT imaging of the abdomen and pelvis was performed using the standard protocol during bolus administration of intravenous contrast. RADIATION DOSE REDUCTION: This exam was performed according to the departmental dose-optimization program which includes automated exposure control, adjustment of the mA and/or kV according to patient size and/or use of iterative reconstruction technique. CONTRAST:  OMNIPAQUE  IOHEXOL  350 MG/ML SOLN COMPARISON:  CT chest 11/05/2023 and CT abdomen pelvis 01/02/2024. FINDINGS: CTA CHEST FINDINGS Cardiovascular: Image quality is degraded by respiratory motion. Negative for pulmonary embolus. Atherosclerotic calcification of the aorta and coronary arteries. Aortic valve replacement. Mitral annulus calcification. Enlarged pulmonic trunk and heart. No pericardial effusion. Ascending aorta measures 3.8 cm (7/89). Mediastinum/Nodes: Thoracic inlet lymph nodes are not enlarged by CT size criteria. Mediastinal lymph nodes measure up to 1.7 cm in the low right paratracheal station, minimally larger but present dating back to 04/14/2020. 1.6 cm right hilar lymph node. No left hilar or axillary adenopathy. Air in fluid in the esophagus are indicative of dysmotility. Lungs/Pleura: Image quality is degraded by expiratory phase imaging, creating added density in the lungs. Patchy rounded consolidation in the right upper lobe. Additional rounded areas of consolidation in the left lower lobe. Nodule in the inferior posterolateral left lower lobe is seen along the left hemidiaphragm and measures 1.3 x 1.6 cm (6/100), unchanged from 04/14/2020 and considered benign. No pleural fluid. Airway is unremarkable. Musculoskeletal: Degenerative changes in the spine. Chronic mild compression of the T4 and T5 superior endplates. Review of the MIP images confirms the above findings.  CT ABDOMEN and PELVIS FINDINGS Hepatobiliary: Liver is enlarged, 19.9 cm. Gallstone. No biliary ductal dilatation. Pancreas: Negative. Spleen: Enlarged, 15.2 cm. Adrenals/Urinary Tract: Adrenal glands are unremarkable. Low-attenuation lesions in the kidneys. No specific follow-up necessary. Kidneys are otherwise unremarkable. Ureters are decompressed. Bladder is grossly unremarkable. Stomach/Bowel: Stomach is distended with fluid. Dilated and fluid-filled jejunum with a transition point in the ventral midline low abdomen (2/51). Remainder of the small bowel is acutely decompressed. There is unobstructed small bowel extending into a right inguinal hernia. Appendix and colon are unremarkable. Vascular/Lymphatic: Atherosclerotic calcification of the aorta. No pathologically enlarged lymph nodes. Reproductive: Prostate is slightly enlarged. Other: Bilateral inguinal hernia repair. Large right inguinal hernia contains unobstructed small bowel. Small left inguinal hernia contains fat. Trace interloop fluid. No mesenteric edema. Musculoskeletal: Degenerative changes in the spine. Review of the MIP images confirms the above findings. IMPRESSION: 1. Negative for pulmonary embolus. 2. High-grade small bowel obstruction with transition point in the low ventral midline abdomen. Minimal associated interloop fluid. No mesenteric edema. 3. Right upper and left  lower lobe pneumonia. Associated enlargement of mediastinal lymph nodes, likely reactive in etiology. 4. Bilateral inguinal hernia repair with a large right inguinal hernia containing unobstructed small bowel and a small left inguinal hernia contains fat. 5. Mild hepatosplenomegaly, improved from 01/02/2024. 6. Cholelithiasis. 7. Mildly enlarged prostate. 8. Aortic atherosclerosis (ICD10-I70.0). Coronary artery calcification. 9. Enlarged pulmonic trunk, indicative of pulmonary arterial hypertension. Electronically Signed   By: Newell Eke M.D.   On: 05/04/2024 13:29    US  Venous Img Lower Bilateral Result Date: 05/04/2024 EXAM: ULTRASOUND DUPLEX OF THE BILATERAL LOWER EXTREMITY VEINS TECHNIQUE: Duplex ultrasound using B-mode/gray scaled imaging and Doppler spectral analysis and color flow was obtained of the deep venous structures of the bilateral lower extremity. COMPARISON: None available. CLINICAL HISTORY: leg swelling FINDINGS: LEFT: The common femoral vein, femoral vein, popliteal vein, and posterior tibial vein demonstrate normal compressibility with normal color flow and spectral analysis. RIGHT: The common femoral vein, femoral vein, popliteal vein, and posterior tibial vein demonstrate normal compressibility with normal color flow and spectral analysis. IMPRESSION: 1. No evidence of deep venous thrombosis. Electronically signed by: Evalene Coho MD 05/04/2024 12:21 PM EST RP Workstation: [id number]   DG Chest Port 1 View Result Date: 05/04/2024 CLINICAL DATA:  Abdominal pain and nausea with difficulty breathing and mild cough for few days. EXAM: PORTABLE CHEST 1 VIEW COMPARISON:  None Available. FINDINGS: Multiple sternal wires are present. The cardiac silhouette is mildly enlarged and unchanged in size. An artificial cardiac valve is seen. Mild to moderate severity infiltrate is seen within the periphery of the mid right lung. Mild left basilar atelectasis and/or infiltrate is also present. No pleural effusion or pneumothorax is identified. Multilevel degenerative changes seen throughout the thoracic spine. IMPRESSION: 1. Mild to moderate severity right mid lung infiltrate. 2. Mild left basilar atelectasis and/or infiltrate. Electronically Signed   By: Suzen Dials M.D.   On: 05/04/2024 11:32     .Critical Care  Performed by: Jerrol Agent, MD Authorized by: Jerrol Agent, MD   Critical care provider statement:    Critical care time (minutes):  120   Critical care was necessary to treat or prevent imminent or life-threatening deterioration of  the following conditions:  Respiratory failure   Critical care was time spent personally by me on the following activities:  Development of treatment plan with patient or surrogate, discussions with consultants, evaluation of patient's response to treatment, examination of patient, ordering and review of laboratory studies, ordering and review of radiographic studies, ordering and performing treatments and interventions, pulse oximetry, re-evaluation of patient's condition and review of old charts   Care discussed with: admitting provider      Medications Ordered in the ED  azithromycin  (ZITHROMAX ) tablet 500 mg (500 mg Oral Given 05/04/24 1311)  fentaNYL  (SUBLIMAZE ) injection 50 mcg (has no administration in time range)  lactated ringers  bolus 1,000 mL (has no administration in time range)  furosemide  (LASIX ) injection 40 mg (40 mg Intravenous Given 05/04/24 1041)  iohexol  (OMNIPAQUE ) 350 MG/ML injection 100 mL (100 mLs Intravenous Contrast Given 05/04/24 1228)  cefTRIAXone  (ROCEPHIN ) 2 g in sodium chloride  0.9 % 100 mL IVPB (2 g Intravenous New Bag/Given 05/04/24 1305)    Clinical Course as of 05/04/24 1343  Mon May 04, 2024  1201 WBC(!): 28.1 [JL]  1201 Pro Brain Natriuretic Peptide(!): 4,570.0 [JL]  1201 Troponin T High Sensitivity(!): 86 [JL]  1212 INR(!): 2.3 [JL]  1212 WBC(!): 28.1 [JL]    Clinical Course User Index [JL] Jerrol Agent, MD  Medical Decision Making Amount and/or Complexity of Data Reviewed Labs: ordered. Decision-making details documented in ED Course. Radiology: ordered.  Risk Prescription drug management. Decision regarding hospitalization.    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  The patient denies any chest pain.  Has had some abdominal discomfort, associated nausea.  He has had some increased swelling in the bilateral legs and ankles, some asymmetry noted. He additionally has had some lower abdominal discomfort, described as in the suprapubic/periumbilical region, he is moving his bowels last bowel movement today and was normal and he is passing gas, no genitourinary symptoms. On arrival to the emergency department, the patient was tachycardic, tachypneic and saturating in the 70s on room air and subsequently placed on oxygen.  On arrival, the patient was afebrile, tachycardic heart rate 140, sinus tachycardia noted on cardiac telemetry, tachypneic RR 26, initially saturating 74% on room air, placed on high flow oxygen via nasal cannula and stabilized on 10 L O2 per minute, saturating in the high 90s.  Blood pressure 115/76 on arrival.  On exam the patient had mild abdominal tenderness to palpation, tachypnea with rales bibasilar and face lower extremity edema noted bilaterally.  Differential diagnosis includes CHF exacerbation, sepsis, commune acquired pneumonia, viral infectious etiology, PE.  Additionally considered acute intra-abdominal emergency such as SBO.  Considered UTI/pyelonephritis.  The access was obtained and the patient was initially administered IV Lasix  given the lower extremity edema and bibasilar rales.  He maintained his oxygen saturations on 10 L O2 per minute.  Labs: CBC with a leukocytosis to 28.1 which of note the patient has a chronic leukocytosis but is not normally this elevated, growing concern for sepsis from infectious etiology.  Chest x-ray: IMPRESSION:  1. Mild to moderate severity right mid lung infiltrate.  2. Mild left basilar atelectasis and/or infiltrate.    DVT ultrasound: IMPRESSION:  1. No evidence of deep venous  thrombosis.    Labs: BNP elevated at 4570, cardiac troponin 86, D-dimer 0.68, INR therapeutic at 2.3, urinalysis without evidence of UTI, BMP with creatinine at baseline, no significant electrolyte abnormality.  Given findings on chest x-ray, was covered with broad-spectrum antibiotics to include IV Rocephin  and azithromycin .  CT abdomen pelvis and CTA PE study: IMPRESSION:  1. Negative for pulmonary embolus.  2. High-grade small bowel obstruction with transition point in the  low ventral midline abdomen. Minimal associated interloop fluid. No  mesenteric edema.  3. Right upper and left lower lobe pneumonia. Associated enlargement  of mediastinal lymph nodes, likely reactive in etiology.  4. Bilateral inguinal hernia repair with a large right inguinal  hernia containing unobstructed small bowel and a small left inguinal  hernia contains fat.  5. Mild hepatosplenomegaly, improved from 01/02/2024.  6. Cholelithiasis.  7. Mildly enlarged prostate.  8. Aortic atherosclerosis (ICD10-I70.0). Coronary artery  calcification.  9. Enlarged pulmonic trunk, indicative of pulmonary arterial  hypertension.    Patient informed of the diagnosis of high-grade bowel obstruction, lactic acid pending, NG tube ordered, surgery consult placed, spoke with Vertell Pringle PA, general surgery to see once admitted to [facility].  Patient also found to have right upper and left lower lobe pneumonia with reactive lymph nodes, enlarged pulmonary trunk indicative of pulmonary arterial  hypertension as well noted.  Given the concern for sepsis from pneumonia, patient was then administered a 1 L LR bolus.  Hospitalist medicine consulted for admission, Dr. Melvin accepting.  Patient hemodynamically stable at time of admission.     Final diagnoses:  Acute respiratory failure with hypoxia (HCC)  Community acquired pneumonia, unspecified laterality  Sepsis with acute hypoxic respiratory failure without septic shock, due to  unspecified organism [facility])  SBO (small bowel obstruction) [facility])    ED Discharge Orders     None          Jerrol Agent, MD 05/04/24 1435  "

## 2024-05-04 NOTE — ED Notes (Signed)
 Family reports pt has not taken his regular medications this morning

## 2024-05-04 NOTE — ED Triage Notes (Signed)
 Reports abd pain, nausea, difficulty breathing/shallow breathing 'for a few days', mild cough. Also reports increased swelling in bilateral legs/ankles. Hx CHF and esophageal tear. Satting at 74% room air in triage, respiratory to triage to eval. Family denies any home oxygen use.

## 2024-05-04 NOTE — Progress Notes (Signed)
 Elink following for sepsis protocol.

## 2024-05-04 NOTE — Progress Notes (Signed)
 Pt has arrived from [facility] for admit. IV amio infusing per orders. NG tube to low intermittent wall suction. Pt remains on 10L HFNC. VS stable (see Epic). [facility] admissions notified of pts arrival via amion.

## 2024-05-04 NOTE — ED Notes (Signed)
 Provider informed of Pts HR.

## 2024-05-04 NOTE — ED Notes (Signed)
 Report given to [facility].

## 2024-05-04 NOTE — ED Notes (Signed)
Report given to the RN on the Floor.  

## 2024-05-05 ENCOUNTER — Inpatient Hospital Stay (HOSPITAL_COMMUNITY)

## 2024-05-05 DIAGNOSIS — J188 Other pneumonia, unspecified organism: Secondary | ICD-10-CM | POA: Diagnosis not present

## 2024-05-05 DIAGNOSIS — A419 Sepsis, unspecified organism: Secondary | ICD-10-CM

## 2024-05-05 DIAGNOSIS — I4891 Unspecified atrial fibrillation: Secondary | ICD-10-CM

## 2024-05-05 LAB — COMPREHENSIVE METABOLIC PANEL WITH GFR
ALT: 28 U/L (ref 0–44)
AST: 22 U/L (ref 15–41)
Albumin: 3.5 g/dL (ref 3.5–5.0)
Alkaline Phosphatase: 75 U/L (ref 38–126)
Anion gap: 12 (ref 5–15)
BUN: 28 mg/dL — ABNORMAL HIGH (ref 8–23)
CO2: 26 mmol/L (ref 22–32)
Calcium: 8.5 mg/dL — ABNORMAL LOW (ref 8.9–10.3)
Chloride: 101 mmol/L (ref 98–111)
Creatinine, Ser: 1.49 mg/dL — ABNORMAL HIGH (ref 0.61–1.24)
GFR, Estimated: 48 mL/min — ABNORMAL LOW
Glucose, Bld: 91 mg/dL (ref 70–99)
Potassium: 4.1 mmol/L (ref 3.5–5.1)
Sodium: 140 mmol/L (ref 135–145)
Total Bilirubin: 2.2 mg/dL — ABNORMAL HIGH (ref 0.0–1.2)
Total Protein: 5.9 g/dL — ABNORMAL LOW (ref 6.5–8.1)

## 2024-05-05 LAB — CBC
HCT: 41.2 % (ref 39.0–52.0)
Hemoglobin: 13.7 g/dL (ref 13.0–17.0)
MCH: 30.6 pg (ref 26.0–34.0)
MCHC: 33.3 g/dL (ref 30.0–36.0)
MCV: 92 fL (ref 80.0–100.0)
Platelets: 82 K/uL — ABNORMAL LOW (ref 150–400)
RBC: 4.48 MIL/uL (ref 4.22–5.81)
RDW: 18.3 % — ABNORMAL HIGH (ref 11.5–15.5)
WBC: 14.9 K/uL — ABNORMAL HIGH (ref 4.0–10.5)
nRBC: 0 % (ref 0.0–0.2)

## 2024-05-05 LAB — MAGNESIUM: Magnesium: 2.2 mg/dL (ref 1.7–2.4)

## 2024-05-05 LAB — PROTIME-INR
INR: 2.5 — ABNORMAL HIGH (ref 0.8–1.2)
Prothrombin Time: 28.3 s — ABNORMAL HIGH (ref 11.4–15.2)

## 2024-05-05 LAB — TROPONIN T, HIGH SENSITIVITY: Troponin T High Sensitivity: 74 ng/L — ABNORMAL HIGH (ref 0–19)

## 2024-05-05 MED ORDER — SODIUM CHLORIDE 0.9 % IV SOLN
100.0000 mg | Freq: Two times a day (BID) | INTRAVENOUS | Status: DC
  Administered 2024-05-05 – 2024-05-06 (×3): 100 mg via INTRAVENOUS
  Filled 2024-05-05 (×3): qty 100

## 2024-05-05 MED ORDER — SODIUM CHLORIDE 0.9 % IV SOLN
2.0000 g | INTRAVENOUS | Status: DC
  Administered 2024-05-05 – 2024-05-07 (×3): 2 g via INTRAVENOUS
  Filled 2024-05-05 (×3): qty 20

## 2024-05-05 MED ORDER — DIATRIZOATE MEGLUMINE & SODIUM 66-10 % PO SOLN
90.0000 mL | Freq: Once | ORAL | Status: AC
  Administered 2024-05-05: 90 mL via NASOGASTRIC

## 2024-05-05 MED ORDER — LEVETIRACETAM (KEPPRA) 500 MG/5 ML ADULT IV PUSH
500.0000 mg | Freq: Every morning | INTRAVENOUS | Status: DC
  Administered 2024-05-05 – 2024-05-06 (×2): 500 mg via INTRAVENOUS
  Filled 2024-05-05 (×2): qty 5

## 2024-05-05 MED ORDER — METHYLPREDNISOLONE SODIUM SUCC 40 MG IJ SOLR
16.0000 mg | Freq: Every day | INTRAMUSCULAR | Status: DC
  Administered 2024-05-05 – 2024-05-06 (×2): 16 mg via INTRAVENOUS
  Filled 2024-05-05 (×2): qty 1

## 2024-05-05 MED ORDER — ACETAMINOPHEN 325 MG PO TABS: 650.0000 mg | ORAL_TABLET | Freq: Four times a day (QID) | ORAL | Status: DC | PRN

## 2024-05-05 MED ORDER — FUROSEMIDE 10 MG/ML IJ SOLN: 40.0000 mg | Freq: Every day | INTRAMUSCULAR | Status: DC

## 2024-05-05 MED ORDER — SODIUM CHLORIDE 0.45 % IV SOLN: INTRAVENOUS | Status: DC

## 2024-05-05 MED ORDER — NALOXONE HCL 0.4 MG/ML IJ SOLN: 0.4000 mg | INTRAMUSCULAR | Status: DC | PRN

## 2024-05-05 MED ORDER — LEVETIRACETAM (KEPPRA) 500 MG/5 ML ADULT IV PUSH
750.0000 mg | Freq: Once | INTRAVENOUS | Status: AC
  Administered 2024-05-05: 750 mg via INTRAVENOUS
  Filled 2024-05-05: qty 10

## 2024-05-05 MED ORDER — LEVETIRACETAM (KEPPRA) 500 MG/5 ML ADULT IV PUSH
750.0000 mg | Freq: Every evening | INTRAVENOUS | Status: DC
  Administered 2024-05-05: 750 mg via INTRAVENOUS
  Filled 2024-05-05: qty 10

## 2024-05-05 MED ORDER — ACETAMINOPHEN 650 MG RE SUPP: 650.0000 mg | Freq: Four times a day (QID) | RECTAL | Status: DC | PRN

## 2024-05-05 MED ORDER — HEPARIN (PORCINE) 25000 UT/250ML-% IV SOLN
1250.0000 [IU]/h | INTRAVENOUS | Status: DC
  Administered 2024-05-05: 1250 [IU]/h via INTRAVENOUS
  Filled 2024-05-05: qty 250

## 2024-05-05 MED ORDER — SODIUM CHLORIDE 3 % IN NEBU
4.0000 mL | INHALATION_SOLUTION | Freq: Two times a day (BID) | RESPIRATORY_TRACT | Status: AC | PRN
  Filled 2024-05-05: qty 4

## 2024-05-05 MED ORDER — FENTANYL CITRATE (PF) 50 MCG/ML IJ SOSY: 12.5000 ug | PREFILLED_SYRINGE | INTRAMUSCULAR | Status: DC | PRN

## 2024-05-05 NOTE — TOC Initial Note (Signed)
 Transition of Care [facility]) - Initial/Assessment Note    Patient Details  Name: Alan Willis MRN: [id number] Date of Birth: [birth year]  [facility] CM/SW Contact:    Sudie Erminio Deems, RN Phone Number: 05/05/2024, 12:58 PM  Clinical Narrative: Patient presented for shortness of breath. PTA patient was from home with spouse. Spouse was at the bedside during the visit. Patient does not use any DME in the home. Patient has insurance and PCP- has no issues with transportation to appointments. ICM will continue to follow for additional needs as the patient progresses.   Expected Discharge Plan: Home w Home Health Services Barriers to Discharge: Continued Medical Work up  Expected Discharge Plan and Services In-house Referral: NA Discharge Planning Services: CM Consult Post Acute Care Choice: Home Health Living arrangements for the past 2 months: Single Family Home                   DME Agency: NA  Prior Living Arrangements/Services Living arrangements for the past 2 months: Single Family Home Lives with:: Spouse Patient language and need for interpreter reviewed:: Yes Do you feel safe going back to the place where you live?: Yes      Need for Family Participation in Patient Care: Yes (Comment) Care giver support system in place?: Yes (comment)   Criminal Activity/Legal Involvement Pertinent to Current Situation/Hospitalization: No - Comment as needed  Activities of Daily Living   ADL Screening (condition at time of admission) Independently performs ADLs?: Yes (appropriate for developmental age) Is the patient deaf or have difficulty hearing?: Yes Does the patient have difficulty seeing, even when wearing glasses/contacts?: No Does the patient have difficulty concentrating, remembering, or making decisions?: No  Permission Sought/Granted Permission sought to share information with : Family Supports, Case Manager  Emotional Assessment Appearance::  Appears stated age Attitude/Demeanor/Rapport: Engaged Affect (typically observed): Appropriate   Alcohol / Substance Use: Not Applicable Psych Involvement: No (comment)  Admission diagnosis:  SBO (small bowel obstruction) (HCC) [K56.609] Acute respiratory failure with hypoxia (HCC) [J96.01] Atrial flutter, unspecified type (HCC) [I48.92] Community acquired pneumonia, unspecified laterality [J18.9] Sepsis with acute hypoxic respiratory failure without septic shock, due to unspecified organism (HCC) [A41.9, R65.20, J96.01] Patient Active Problem List   Diagnosis Date Noted   Multifocal pneumonia 05/05/2024   Sepsis (HCC) 05/05/2024   Stage 3a chronic kidney disease (HCC) 05/04/2024   Seizure (HCC) 05/04/2024   Sarcoidosis 05/04/2024   Recurrent epistaxis 05/04/2024   Other specified anemias 05/04/2024   History of gastrointestinal hemorrhage 05/04/2024   Chronic systolic heart failure (HCC) 05/04/2024   Thrombocytopenia 05/04/2024   Acute respiratory failure with hypoxia (HCC) 05/04/2024   Atrial flutter (HCC) 03/11/2023   Acute exacerbation of CHF (congestive heart failure) (HCC) 03/07/2023   Shock (HCC) 03/05/2023   GI bleed 03/03/2023   Chronic anticoagulation 03/03/2023   H/O heart valve replacement with mechanical valve 03/03/2023   Memory loss 03/03/2023   History of seizures 03/03/2023   Localization-related idiopathic epilepsy and epileptic syndromes with seizures of localized onset, not intractable, without status epilepticus (HCC) 03/03/2023   AKI (acute kidney injury) 03/03/2023   Gallstone 03/03/2023   CKD (chronic kidney disease) stage 2, GFR 60-89 ml/min 03/03/2023   Elevated troponin 03/03/2023   Recurrent right inguinal hernia 03/03/2023   Abdominal aortic atherosclerosis 03/03/2023   Atrial fibrillation with RVR (HCC) 03/03/2023   Diverticulosis of colon with hemorrhage 03/03/2023   Small bowel obstruction (HCC) 03/03/2023   Atrial flutter with rapid  ventricular response (HCC) 03/16/2021   Mitral valve stenosis 07/14/2019   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) 05/21/2011   PCP:  Cleotilde Planas, MD Pharmacy:   [facility] [phone] [facility] - [place], [place] - [address] [place] [address] Phone: [phone] Fax: [phone]     Social Drivers of Health (SDOH) Social History: SDOH Screenings   Food Insecurity: No Food Insecurity (05/04/2024)  Housing: Low Risk (05/04/2024)  Transportation Needs: No Transportation Needs (05/04/2024)  Utilities: Not At Risk (05/04/2024)  Depression (PHQ2-9): Low Risk (11/06/2023)  Social Connections: Socially Integrated (05/04/2024)  Tobacco Use: High Risk (05/04/2024)   Readmission Risk Interventions    03/13/2023    6:32 PM 03/05/2023   12:52 PM  Readmission Risk Prevention Plan  Transportation Screening Complete Complete  PCP or Specialist Appt within 5-7 Days Complete   PCP or Specialist Appt within 3-5 Days  Complete  Home Care Screening Complete   Medication Review (RN CM) Complete   HRI or Home Care Consult  Complete  Social Work Consult for Recovery Care Planning/Counseling  Complete  Palliative Care Screening  Not Applicable  Medication Review Oceanographer)  Complete

## 2024-05-05 NOTE — Consult Note (Signed)
 [facility] 1945-08-07  [id number].    Requesting MD: Jerrol MD Chief Complaint/Reason for Consult: SBO  HPI:  [facility] is a 79 year old male with a past medical history of a flutter on warfarin, mechanical aortic valve, heart failure, seizure disorder, remote history of colectomy for sigmoid stricture, Mallory-Weiss tear, and anemia/thrombocytopenia who [facility] to [facility].  This morning he is unable to tell me what brought him to the [facility].  Per chart review [facility] with shortness of breath as well as 24 hours of abdominal pain.  This morning he denies fever, chills, chest pain, heart palpitations, abdominal pain, nausea, or urinary symptoms.  States he is passing flatus.  States his last BM was yesterday.  Denies a known history of bowel obstruction.  At baseline he lives at home with his wife, states he has 3 sons in the area.  Denies tobacco or drug use.  Reports drinking 1 beer daily.  Currently on Coumadin .  When I asked him about his history of abdominal surgery he reports open heart surgery, does not recall having surgery on his colon.  Does report history of colonoscopy, unsure last time, denies history of intestinal malignancy.  States he and his wife go to the [facility] daily to walk and lift weights.  Also plays golf.  Independent of ADLs.   ROS: Review of Systems  All other systems reviewed and are negative.   History reviewed. No pertinent family history.  Past Medical History:  Diagnosis Date   A-fib [facility])    Atrial flutter with rapid ventricular response (HCC) 03/16/2021   Cholelithiasis 2011   Chronic anticoagulation 03/03/2023   Colon, diverticulosis    H/O heart valve replacement with mechanical valve 03/03/2023   Bjork-Shiley tilting disc valve in 1983 at [facility] in [facility] .        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   Stricture of sigmoid  colon s/p colectomy 2007 2007    Past Surgical History:  Procedure Laterality Date   ATRIAL FIBRILLATION ABLATION     BIOPSY  03/05/2023   Procedure: BIOPSY;  Surgeon: [facility] Jasper, MD;  Location: [facility];  Service: Gastroenterology;;   CARDIOVERSION N/A 03/13/2023   Procedure: CARDIOVERSION;  Surgeon: Alan Ronal BRAVO, MD;  Location: [facility];  Service: Cardiovascular;  Laterality: N/A;   CARDIOVERSION N/A 03/13/2023   Procedure: CARDIOVERSION;  Surgeon: Alan Ronal BRAVO, MD;  Location: [facility];  Service: Cardiovascular;  Laterality: N/A;   COLON RESECTION SIGMOID  07/26/2005   [facility] for diverticular sigmoid colon stricture   ESOPHAGOGASTRODUODENOSCOPY (EGD) WITH PROPOFOL  N/A 03/05/2023   Procedure: ESOPHAGOGASTRODUODENOSCOPY (EGD) WITH PROPOFOL ;  Surgeon: [facility] Jasper, MD;  Location: [facility];  Service: Gastroenterology;  Laterality: N/A;   HEMOSTASIS CLIP PLACEMENT  03/05/2023   Procedure: HEMOSTASIS CLIP PLACEMENT;  Surgeon: [facility] Jasper, MD;  Location: [facility];  Service: Gastroenterology;;   LAPAROSCOPIC INGUINAL HERNIA REPAIR Bilateral 04/03/1999   [facility]   VALVE REPLACEMENT      Social History:  reports that he has been smoking cigars. He has never used smokeless tobacco. He reports that he does not currently use alcohol. He reports that he does not use drugs.  Allergies: Allergies[1]  Medications Prior to Admission  Medication Sig Dispense Refill   amiodarone  (PACERONE ) 200 MG tablet Take 100 mg by mouth daily.     empagliflozin  (JARDIANCE ) 10 MG  TABS tablet Take 1 tablet (10 mg total) by mouth daily. 90 tablet 3   furosemide  (LASIX ) 20 MG tablet Take 20 mg by mouth daily.     levETIRAcetam  (KEPPRA ) 500 MG tablet Take 1 tablet in AM and 1 and 1/2 tablets in PM 225 tablet 3   rosuvastatin  (CRESTOR ) 20 MG tablet Take 1 tablet (20 mg total) by mouth daily. 90 tablet 3   warfarin (COUMADIN ) 6 MG tablet Take 3-6 mg by mouth See admin  instructions. Take 6 mg by mouth in the morning on Sun/Mon/Wed/Thurs/Fri/Sat and 3 mg on Tues (Patient taking differently: Take 2-4 mg by mouth See admin instructions. Take 6 mg by mouth in the morning on Sun/Mon/Wed/Thurs/Fri/Sat and 3 mg on Tues)     Cholecalciferol (VITAMIN D3) 1000 units CAPS Take 1,000 Units by mouth daily.     ferrous sulfate 325 (65 FE) MG tablet Take 325 mg by mouth daily with breakfast.     IMODIUM A-D 2 MG tablet Take 2 mg by mouth 4 (four) times daily as needed for diarrhea or loose stools.     magnesium  oxide (MAG-OX) 400 (240 Mg) MG tablet Take 400 mg by mouth daily.     ondansetron  (ZOFRAN ) 4 MG tablet Take 4 mg by mouth every 8 (eight) hours as needed.     predniSONE  (DELTASONE ) 20 MG tablet TAKE 3 TABLETS (60 MG TOTAL) BY MOUTH DAILY WITH BREAKFAST. 63 tablet 1   TYLENOL  500 MG tablet Take 500 mg by mouth every 6 (six) hours as needed for mild pain (pain score 1-3) or headache.       Physical Exam: Blood pressure 104/63, pulse 87, temperature 98.3 F (36.8 C), temperature source Oral, resp. rate 20, height 5' 9 (1.753 m), weight 75.1 kg, SpO2 98%. General: Pleasant white male, laying on [facility] bed, appears stated age, NAD, WD/WN HEENT: head -normocephalic, atraumatic; Eyes: PERRLA, no conjunctival injection; anicteric sclerae Neck- Trachea is midline CV-irregular rhythm, heart rate 90s during my exam, there is no lower extremity edema Pulm- breathing is non-labored on nasal cannula Abd- soft, nontender, nondistended, previous midline laparotomy scar appears well-healing, no hernias.  No guarding no peritonitis. GU- deferred  MSK- UE/LE symmetrical, no cyanosis, clubbing, or edema. Neuro- CN II-XII grossly in tact, no paresthesias. Psych- oriented to self and [facility].  Unable to report the year or the president. Skin: warm and dry, no rashes or lesions   Results for orders placed or performed during the [facility] encounter of 05/04/24 (from the past 48  hours)  Basic metabolic panel     Status: Abnormal   Collection Time: 05/04/24 10:27 AM  Result Value Ref Range   Sodium 140 135 - 145 mmol/L   Potassium 3.9 3.5 - 5.1 mmol/L   Chloride 99 98 - 111 mmol/L   CO2 25 22 - 32 mmol/L   Glucose, Bld 120 (H) 70 - 99 mg/dL    Comment: Glucose reference range applies only to samples taken after fasting for at least 8 hours.   BUN 20 8 - 23 mg/dL   Creatinine, Ser 8.65 (H) 0.61 - 1.24 mg/dL   Calcium  9.9 8.9 - 10.3 mg/dL   GFR, Estimated 55 (L) >60 mL/min    Comment: (NOTE) Calculated using the CKD-EPI Creatinine Equation (2021)    Anion gap 16 (H) 5 - 15    Comment: Performed at [facility], [address], [place], [place] [address]  CBC with Differential     Status:  Abnormal   Collection Time: 05/04/24 10:27 AM  Result Value Ref Range   WBC 28.1 (H) 4.0 - 10.5 K/uL    Comment: WHITE COUNT CONFIRMED ON SMEAR   RBC 5.48 4.22 - 5.81 MIL/uL   Hemoglobin 16.5 13.0 - 17.0 g/dL   HCT 50.1 60.9 - 47.9 %   MCV 90.9 80.0 - 100.0 fL   MCH 30.1 26.0 - 34.0 pg   MCHC 33.1 30.0 - 36.0 g/dL   RDW 81.0 (H) 88.4 - 84.4 %   Platelets 139 (L) 150 - 400 K/uL    Comment: SPECIMEN CHECKED FOR CLOTS REPEATED TO VERIFY    nRBC 0.0 0.0 - 0.2 %   Neutrophils Relative % 90 %   Neutro Abs 25.3 (H) 1.7 - 7.7 K/uL   Lymphocytes Relative 3 %   Lymphs Abs 1.0 0.7 - 4.0 K/uL   Monocytes Relative 6 %   Monocytes Absolute 1.6 (H) 0.1 - 1.0 K/uL   Eosinophils Relative 0 %   Eosinophils Absolute 0.0 0.0 - 0.5 K/uL   Basophils Relative 0 %   Basophils Absolute 0.1 0.0 - 0.1 K/uL   RBC Morphology MORPHOLOGY UNREMARKABLE    Smear Review Normal platelet morphology    Immature Granulocytes 1 %   Abs Immature Granulocytes 0.20 (H) 0.00 - 0.07 K/uL    Comment: Performed at [facility], [address] [place], [place] [address]  Brain natriuretic peptide     Status: Abnormal   Collection Time: 05/04/24 10:27 AM  Result  Value Ref Range   Pro Brain Natriuretic Peptide 4,570.0 (H) <300.0 pg/mL    Comment: (NOTE) Age Group        Cut-Points    Interpretation  < 50 years     450 pg/mL       NT-proBNP > 450 pg/mL indicates                                ADHF is likely              50 to 75 years  900 pg/mL      NT-proBNP > 900 pg/mL indicates          ADHF is likely  > 75 years      1800 pg/mL     NT-proBNP > 1800 pg/mL indicates          ADHF is likely                           All ages    Results between       Indeterminate. Further clinical             300 and the cut-   information is needed to determine            point for age group   if ADHF is present.                                                             Elecsys proBNP II/ Elecsys proBNP II STAT           Cut-Point  Interpretation  300 pg/mL                    NT-proBNP <300pg/mL indicates                             ADHF is not likely  Performed at [facility], [address], [place], [place] [address]   D-dimer, quantitative     Status: Abnormal   Collection Time: 05/04/24 10:27 AM  Result Value Ref Range   D-Dimer, Quant 0.68 (H) 0.00 - 0.50 ug/mL-FEU    Comment: (NOTE) At the manufacturer cut-off value of 0.5 g/mL FEU, this assay has a negative predictive value of 95-100%.This assay is intended for use in conjunction with a clinical pretest probability (PTP) assessment model to exclude pulmonary embolism (PE) and deep venous thrombosis (DVT) in outpatients suspected of PE or DVT. Results should be correlated with clinical presentation. Performed at [facility], [address] [place], [place] [address]   Troponin T, High Sensitivity     Status: Abnormal   Collection Time: 05/04/24 10:27 AM  Result Value Ref Range   Troponin T High Sensitivity 86 (H) 0 - 19 ng/L    Comment: (NOTE) Biotin concentrations > 1000 ng/mL falsely decrease TnT results.  Serial  cardiac troponin measurements are suggested.  Refer to the Links section for chest pain algorithms and additional  guidance. Performed at [facility], [address] [place], [place] [address]   Protime-INR     Status: Abnormal   Collection Time: 05/04/24 10:27 AM  Result Value Ref Range   Prothrombin Time 26.0 (H) 11.4 - 15.2 seconds   INR 2.3 (H) 0.8 - 1.2    Comment: (NOTE) INR goal varies based on device and disease states. Performed at [facility], [address] [place], [place] [address]   Resp panel by RT-PCR (RSV, Flu A&B, Covid) Anterior Nasal Swab     Status: None   Collection Time: 05/04/24 10:45 AM   Specimen: Anterior Nasal Swab  Result Value Ref Range   SARS Coronavirus 2 by RT PCR NEGATIVE NEGATIVE    Comment: (NOTE) SARS-CoV-2 target nucleic acids are NOT DETECTED.  The SARS-CoV-2 RNA is generally detectable in upper respiratory specimens during the acute phase of infection. The lowest concentration of SARS-CoV-2 viral copies this assay can detect is 138 copies/mL. A negative result does not preclude SARS-Cov-2 infection and should not be used as the sole basis for treatment or other patient management decisions. A negative result may occur with  improper specimen collection/handling, submission of specimen other than nasopharyngeal swab, presence of viral mutation(s) within the areas targeted by this assay, and inadequate number of viral copies(<138 copies/mL). A negative result must be combined with clinical observations, patient history, and epidemiological information. The expected result is Negative.  Fact Sheet for Patients:  bloggercourse.com  Fact Sheet for Healthcare Providers:  seriousbroker.it  This test is no t yet approved or cleared by the [place]  FDA and  has been authorized for detection and/or diagnosis of SARS-CoV-2 by FDA under an  Emergency Use Authorization (EUA). This EUA will remain  in effect (meaning this test can be used) for the duration of the COVID-19 declaration under Section 564(b)(1) of the Act, 21 U.S.C.section 360bbb-3(b)(1), unless the authorization is terminated  or revoked sooner.       Influenza A by PCR NEGATIVE NEGATIVE   Influenza B by PCR  NEGATIVE NEGATIVE    Comment: (NOTE) The Xpert Xpress SARS-CoV-2/FLU/RSV plus assay is intended as an aid in the diagnosis of influenza from Nasopharyngeal swab specimens and should not be used as a sole basis for treatment. Nasal washings and aspirates are unacceptable for Xpert Xpress SARS-CoV-2/FLU/RSV testing.  Fact Sheet for Patients: bloggercourse.com  Fact Sheet for Healthcare Providers: seriousbroker.it  This test is not yet approved or cleared by the [place]  FDA and has been authorized for detection and/or diagnosis of SARS-CoV-2 by FDA under an Emergency Use Authorization (EUA). This EUA will remain in effect (meaning this test can be used) for the duration of the COVID-19 declaration under Section 564(b)(1) of the Act, 21 U.S.C. section 360bbb-3(b)(1), unless the authorization is terminated or revoked.     Resp Syncytial Virus by PCR NEGATIVE NEGATIVE    Comment: (NOTE) Fact Sheet for Patients: bloggercourse.com  Fact Sheet for Healthcare Providers: seriousbroker.it  This test is not yet approved or cleared by the [place]  FDA and has been authorized for detection and/or diagnosis of SARS-CoV-2 by FDA under an Emergency Use Authorization (EUA). This EUA will remain in effect (meaning this test can be used) for the duration of the COVID-19 declaration under Section 564(b)(1) of the Act, 21 U.S.C. section 360bbb-3(b)(1), unless the authorization is terminated or revoked.  Performed at [facility], [address], [place], [place] [address]   Urinalysis, w/ Reflex to Culture (Infection Suspected) -Urine, Clean Catch     Status: Abnormal   Collection Time: 05/04/24 11:20 AM  Result Value Ref Range   Specimen Source URINE, CLEAN CATCH    Color, Urine YELLOW YELLOW   APPearance CLEAR CLEAR   Specific Gravity, Urine 1.026 1.005 - 1.030   pH 6.0 5.0 - 8.0   Glucose, UA >1,000 (A) NEGATIVE mg/dL   Hgb urine dipstick NEGATIVE NEGATIVE   Bilirubin Urine NEGATIVE NEGATIVE   Ketones, ur NEGATIVE NEGATIVE mg/dL   Protein, ur TRACE (A) NEGATIVE mg/dL   Nitrite NEGATIVE NEGATIVE   Leukocytes,Ua NEGATIVE NEGATIVE   RBC / HPF 0-5 0 - 5 RBC/hpf   WBC, UA 0-5 0 - 5 WBC/hpf    Comment:        Reflex urine culture not performed if WBC <=10, OR if Squamous epithelial cells >5. If Squamous epithelial cells >5 suggest recollection.    Bacteria, UA NONE SEEN NONE SEEN   Squamous Epithelial / HPF 0-5 0 - 5 /HPF   Hyaline Casts, UA PRESENT     Comment: Performed at [facility], [address] [place], [place] [address]  Blood culture (routine x 2)     Status: None (Preliminary result)   Collection Time: 05/04/24 12:41 PM   Specimen: BLOOD RIGHT FOREARM  Result Value Ref Range   Specimen Description      BLOOD RIGHT FOREARM Performed at [facility], [address], [place], [place] [address]    Special Requests      Blood Culture adequate volume BOTTLES DRAWN AEROBIC AND ANAEROBIC Performed at [facility], [address], [place], [place] [address]    Culture      NO GROWTH < 12 HOURS Performed at [facility], [address] [place], [place] [address]    Report Status PENDING   Blood culture (routine x 2)     Status: None (Preliminary result)   Collection Time: 05/04/24 12:46 PM   Specimen: BLOOD  Result Value Ref Range   Specimen  Description      BLOOD LEFT ANTECUBITAL Performed at [facility], [address] [place], [place] [address]    Special Requests      Blood Culture adequate volume BOTTLES DRAWN AEROBIC AND ANAEROBIC Performed at [facility], [address] [place], [place] [address]    Culture      NO GROWTH < 12 HOURS Performed at [facility], [address], [place], [place] [address]    Report Status PENDING   Lactic acid, plasma     Status: Abnormal   Collection Time: 05/04/24  1:07 PM  Result Value Ref Range   Lactic Acid, Venous 2.0 (HH) 0.5 - 1.9 mmol/L    Comment: Critical Value, Read Back and verified with RN JINNY [facility] AT 1402 05/04/24 AC Performed at [facility], [address] [place], [place], [place] [address]   Lactic acid, plasma     Status: None   Collection Time: 05/04/24  2:41 PM  Result Value Ref Range   Lactic Acid, Venous 1.8 0.5 - 1.9 mmol/L    Comment: Performed at [facility], [address] [place], [place] [address]  Comprehensive metabolic panel     Status: Abnormal   Collection Time: 05/05/24  4:20 AM  Result Value Ref Range   Sodium 140 135 - 145 mmol/L   Potassium 4.1 3.5 - 5.1 mmol/L   Chloride 101 98 - 111 mmol/L   CO2 26 22 - 32 mmol/L   Glucose, Bld 91 70 - 99 mg/dL    Comment: Glucose reference range applies only to samples taken after fasting for at least 8 hours.   BUN 28 (H) 8 - 23 mg/dL   Creatinine, Ser 8.50 (H) 0.61 - 1.24 mg/dL   Calcium  8.5 (L) 8.9 - 10.3 mg/dL   Total Protein 5.9 (L) 6.5 - 8.1 g/dL   Albumin  3.5 3.5 - 5.0 g/dL   AST 22 15 - 41 U/L   ALT 28 0 - 44 U/L   Alkaline Phosphatase 75 38 - 126 U/L   Total Bilirubin 2.2 (H) 0.0 - 1.2 mg/dL   GFR, Estimated 48 (L) >60 mL/min    Comment: (NOTE) Calculated using the CKD-EPI Creatinine Equation (2021)    Anion gap 12 5 - 15    Comment: Performed at [facility], [address] [place], [place] [address]  CBC     Status: Abnormal   Collection Time: 05/05/24  4:20 AM  Result Value Ref Range   WBC  14.9 (H) 4.0 - 10.5 K/uL   RBC 4.48 4.22 - 5.81 MIL/uL   Hemoglobin 13.7 13.0 - 17.0 g/dL   HCT 58.7 60.9 - 47.9 %   MCV 92.0 80.0 - 100.0 fL   MCH 30.6 26.0 - 34.0 pg   MCHC 33.3 30.0 - 36.0 g/dL   RDW 81.6 (H) 88.4 - 84.4 %   Platelets 82 (L) 150 - 400 K/uL    Comment: REPEATED TO VERIFY PLATELET COUNT CONFIRMED BY SMEAR SPECIMEN CHECKED FOR CLOTS Immature Platelet Fraction may be clinically indicated, consider ordering this additional test [id number]    nRBC 0.0 0.0 - 0.2 %    Comment: Performed at [facility], [address], [place], [place] [address]  Magnesium      Status: None   Collection Time: 05/05/24  4:20 AM  Result Value Ref Range   Magnesium  2.2 1.7 - 2.4 mg/dL    Comment: Performed at [facility], [address] [place], [place] [address]  Troponin T, High Sensitivity     Status: Abnormal   Collection Time: 05/05/24  4:20 AM  Result Value Ref Range   Troponin T High Sensitivity 74 (H) 0 - 19 ng/L    Comment: (NOTE) Biotin concentrations > 1000 ng/mL falsely decrease TnT results.  Serial cardiac troponin measurements are suggested.  Refer to the Links section for chest pain algorithms and additional  guidance. Performed at [facility], [address], [place], [place] [address]   Protime-INR     Status: Abnormal   Collection Time: 05/05/24  4:20 AM  Result Value Ref Range   Prothrombin Time 28.3 (H) 11.4 - 15.2 seconds   INR 2.5 (H) 0.8 - 1.2    Comment: (NOTE) INR goal varies based on device and disease states. Performed at [facility], [address] [place], [place] [address]    DG Abdomen 1 View Result Date: 05/04/2024 EXAM: 1 VIEW XRAY OF THE ABDOMEN 05/04/2024 02:18:47 PM COMPARISON: CT abdomen and pelvis 05/04/2024. CLINICAL HISTORY: confirm NG FINDINGS: LINES, TUBES AND DEVICES: Enteric tube in place below the diaphragm with tip in the mid stomach. BOWEL: Dilated small bowel loops in visualized upper abdomen. SOFT  TISSUES: No abnormal calcifications. BONES: No acute fracture. LUNGS: Patchy airspace opacity in the right mid lung. IMPRESSION: 1. Enteric tube tip terminates in the mid stomach. 2. Dilated small bowel loops in the upper abdomen. 3. Patchy right mid lung airspace opacity. Electronically signed by: Greig Pique MD 05/04/2024 03:17 PM EST RP Workstation: [id number]   CT Angio Chest PE W and/or Wo Contrast Result Date: 05/04/2024 CLINICAL DATA:  Pulmonary embolism suspected, high probability. Abdominal pain, nausea, difficulty breathing, cough. Leg and ankle swelling. Low O2 sat. EXAM: CT ANGIOGRAPHY CHEST CT ABDOMEN AND PELVIS WITH CONTRAST TECHNIQUE: Multidetector CT imaging of the chest was performed using the standard protocol during bolus administration of intravenous contrast. Multiplanar CT image reconstructions and MIPs were obtained to evaluate the vascular anatomy. Multidetector CT imaging of the abdomen and pelvis was performed using the standard protocol during bolus administration of intravenous contrast. RADIATION DOSE REDUCTION: This exam was performed according to the departmental dose-optimization program which includes automated exposure control, adjustment of the mA and/or kV according to patient size and/or use of iterative reconstruction technique. CONTRAST:  OMNIPAQUE  IOHEXOL  350 MG/ML SOLN COMPARISON:  CT chest 11/05/2023 and CT abdomen pelvis 01/02/2024. FINDINGS: CTA CHEST FINDINGS Cardiovascular: Image quality is degraded by respiratory motion. Negative for pulmonary embolus. Atherosclerotic calcification of the aorta and coronary arteries. Aortic valve replacement. Mitral annulus calcification. Enlarged pulmonic trunk and heart. No pericardial effusion. Ascending aorta measures 3.8 cm (7/89). Mediastinum/Nodes: Thoracic inlet lymph nodes are not enlarged by CT size criteria. Mediastinal lymph nodes measure up to 1.7 cm in the low right paratracheal station, minimally larger but  present dating back to 04/14/2020. 1.6 cm right hilar lymph node. No left hilar or axillary adenopathy. Air in fluid in the esophagus are indicative of dysmotility. Lungs/Pleura: Image quality is degraded by expiratory phase imaging, creating added density in the lungs. Patchy rounded consolidation in the right upper lobe. Additional rounded areas of consolidation in the left lower lobe. Nodule in the inferior posterolateral left lower lobe is seen along the left hemidiaphragm and measures 1.3 x 1.6 cm (6/100), unchanged from 04/14/2020 and considered benign. No pleural fluid. Airway is unremarkable. Musculoskeletal: Degenerative changes in the spine. Chronic mild compression of  the T4 and T5 superior endplates. Review of the MIP images confirms the above findings. CT ABDOMEN and PELVIS FINDINGS Hepatobiliary: Liver is enlarged, 19.9 cm. Gallstone. No biliary ductal dilatation. Pancreas: Negative. Spleen: Enlarged, 15.2 cm. Adrenals/Urinary Tract: Adrenal glands are unremarkable. Low-attenuation lesions in the kidneys. No specific follow-up necessary. Kidneys are otherwise unremarkable. Ureters are decompressed. Bladder is grossly unremarkable. Stomach/Bowel: Stomach is distended with fluid. Dilated and fluid-filled jejunum with a transition point in the ventral midline low abdomen (2/51). Remainder of the small bowel is acutely decompressed. There is unobstructed small bowel extending into a right inguinal hernia. Appendix and colon are unremarkable. Vascular/Lymphatic: Atherosclerotic calcification of the aorta. No pathologically enlarged lymph nodes. Reproductive: Prostate is slightly enlarged. Other: Bilateral inguinal hernia repair. Large right inguinal hernia contains unobstructed small bowel. Small left inguinal hernia contains fat. Trace interloop fluid. No mesenteric edema. Musculoskeletal: Degenerative changes in the spine. Review of the MIP images confirms the above findings. IMPRESSION: 1. Negative for  pulmonary embolus. 2. High-grade small bowel obstruction with transition point in the low ventral midline abdomen. Minimal associated interloop fluid. No mesenteric edema. 3. Right upper and left lower lobe pneumonia. Associated enlargement of mediastinal lymph nodes, likely reactive in etiology. 4. Bilateral inguinal hernia repair with a large right inguinal hernia containing unobstructed small bowel and a small left inguinal hernia contains fat. 5. Mild hepatosplenomegaly, improved from 01/02/2024. 6. Cholelithiasis. 7. Mildly enlarged prostate. 8. Aortic atherosclerosis (ICD10-I70.0). Coronary artery calcification. 9. Enlarged pulmonic trunk, indicative of pulmonary arterial hypertension. Electronically Signed   By: Newell Eke M.D.   On: 05/04/2024 13:29   CT ABDOMEN PELVIS W CONTRAST Result Date: 05/04/2024 CLINICAL DATA:  Pulmonary embolism suspected, high probability. Abdominal pain, nausea, difficulty breathing, cough. Leg and ankle swelling. Low O2 sat. EXAM: CT ANGIOGRAPHY CHEST CT ABDOMEN AND PELVIS WITH CONTRAST TECHNIQUE: Multidetector CT imaging of the chest was performed using the standard protocol during bolus administration of intravenous contrast. Multiplanar CT image reconstructions and MIPs were obtained to evaluate the vascular anatomy. Multidetector CT imaging of the abdomen and pelvis was performed using the standard protocol during bolus administration of intravenous contrast. RADIATION DOSE REDUCTION: This exam was performed according to the departmental dose-optimization program which includes automated exposure control, adjustment of the mA and/or kV according to patient size and/or use of iterative reconstruction technique. CONTRAST:  OMNIPAQUE  IOHEXOL  350 MG/ML SOLN COMPARISON:  CT chest 11/05/2023 and CT abdomen pelvis 01/02/2024. FINDINGS: CTA CHEST FINDINGS Cardiovascular: Image quality is degraded by respiratory motion. Negative for pulmonary embolus. Atherosclerotic  calcification of the aorta and coronary arteries. Aortic valve replacement. Mitral annulus calcification. Enlarged pulmonic trunk and heart. No pericardial effusion. Ascending aorta measures 3.8 cm (7/89). Mediastinum/Nodes: Thoracic inlet lymph nodes are not enlarged by CT size criteria. Mediastinal lymph nodes measure up to 1.7 cm in the low right paratracheal station, minimally larger but present dating back to 04/14/2020. 1.6 cm right hilar lymph node. No left hilar or axillary adenopathy. Air in fluid in the esophagus are indicative of dysmotility. Lungs/Pleura: Image quality is degraded by expiratory phase imaging, creating added density in the lungs. Patchy rounded consolidation in the right upper lobe. Additional rounded areas of consolidation in the left lower lobe. Nodule in the inferior posterolateral left lower lobe is seen along the left hemidiaphragm and measures 1.3 x 1.6 cm (6/100), unchanged from 04/14/2020 and considered benign. No pleural fluid. Airway is unremarkable. Musculoskeletal: Degenerative changes in the spine. Chronic mild compression of the T4 and  T5 superior endplates. Review of the MIP images confirms the above findings. CT ABDOMEN and PELVIS FINDINGS Hepatobiliary: Liver is enlarged, 19.9 cm. Gallstone. No biliary ductal dilatation. Pancreas: Negative. Spleen: Enlarged, 15.2 cm. Adrenals/Urinary Tract: Adrenal glands are unremarkable. Low-attenuation lesions in the kidneys. No specific follow-up necessary. Kidneys are otherwise unremarkable. Ureters are decompressed. Bladder is grossly unremarkable. Stomach/Bowel: Stomach is distended with fluid. Dilated and fluid-filled jejunum with a transition point in the ventral midline low abdomen (2/51). Remainder of the small bowel is acutely decompressed. There is unobstructed small bowel extending into a right inguinal hernia. Appendix and colon are unremarkable. Vascular/Lymphatic: Atherosclerotic calcification of the aorta. No  pathologically enlarged lymph nodes. Reproductive: Prostate is slightly enlarged. Other: Bilateral inguinal hernia repair. Large right inguinal hernia contains unobstructed small bowel. Small left inguinal hernia contains fat. Trace interloop fluid. No mesenteric edema. Musculoskeletal: Degenerative changes in the spine. Review of the MIP images confirms the above findings. IMPRESSION: 1. Negative for pulmonary embolus. 2. High-grade small bowel obstruction with transition point in the low ventral midline abdomen. Minimal associated interloop fluid. No mesenteric edema. 3. Right upper and left lower lobe pneumonia. Associated enlargement of mediastinal lymph nodes, likely reactive in etiology. 4. Bilateral inguinal hernia repair with a large right inguinal hernia containing unobstructed small bowel and a small left inguinal hernia contains fat. 5. Mild hepatosplenomegaly, improved from 01/02/2024. 6. Cholelithiasis. 7. Mildly enlarged prostate. 8. Aortic atherosclerosis (ICD10-I70.0). Coronary artery calcification. 9. Enlarged pulmonic trunk, indicative of pulmonary arterial hypertension. Electronically Signed   By: Newell Eke M.D.   On: 05/04/2024 13:29   US  Venous Img Lower Bilateral Result Date: 05/04/2024 EXAM: ULTRASOUND DUPLEX OF THE BILATERAL LOWER EXTREMITY VEINS TECHNIQUE: Duplex ultrasound using B-mode/gray scaled imaging and Doppler spectral analysis and color flow was obtained of the deep venous structures of the bilateral lower extremity. COMPARISON: None available. CLINICAL HISTORY: leg swelling FINDINGS: LEFT: The common femoral vein, femoral vein, popliteal vein, and posterior tibial vein demonstrate normal compressibility with normal color flow and spectral analysis. RIGHT: The common femoral vein, femoral vein, popliteal vein, and posterior tibial vein demonstrate normal compressibility with normal color flow and spectral analysis. IMPRESSION: 1. No evidence of deep venous thrombosis.  Electronically signed by: Evalene Coho MD 05/04/2024 12:21 PM EST RP Workstation: [id number]   DG Chest Port 1 View Result Date: 05/04/2024 CLINICAL DATA:  Abdominal pain and nausea with difficulty breathing and mild cough for few days. EXAM: PORTABLE CHEST 1 VIEW COMPARISON:  None Available. FINDINGS: Multiple sternal wires are present. The cardiac silhouette is mildly enlarged and unchanged in size. An artificial cardiac valve is seen. Mild to moderate severity infiltrate is seen within the periphery of the mid right lung. Mild left basilar atelectasis and/or infiltrate is also present. No pleural effusion or pneumothorax is identified. Multilevel degenerative changes seen throughout the thoracic spine. IMPRESSION: 1. Mild to moderate severity right mid lung infiltrate. 2. Mild left basilar atelectasis and/or infiltrate. Electronically Signed   By: Suzen Dials M.D.   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 -  Patient's history, labs, imaging of all been personally reviewed.  -  Per chart review he had resection of a diverticular sigmoid stricture in 2007 by Alan. Eletha, underwent laparoscopic inguinal hernia repair in 2000 by Alan. Lily.  - CT scan of the abdomen and pelvis on 1/19 shows high-grade bowel obstruction with transition zone in the mid abdomen.  No  evidence of pneumoperitoneum, pneumatosis.  No peritonitis on exam.  No emergent role for surgery this morning. - Continue NG tube decompression, small bowel obstruction protocol with Gastrografin  per NG tube ordered - [facility] will follow  FEN - NPO, IVF per primary, NG to LIWS VTE - SCD's, heparin  drip for a flutter ID -Rocephin  for pneumonia Admit - [facility] service  Below per [facility] Acute hypoxemic respiratory failure Multifocal pneumonia Acute on chronic heart failure Mitral regurg A flutter with RVR, now rate controlled Elevated troponin, 86  yesterday, 74 today. QT prolongation History of mechanical aortic valve Seizure disorder on IV Keppra  Chronic thrombocytopenia, on a prolonged prednisone  taper, transition to daily IV Solu-Medrol  in the [place]  I reviewed nursing notes, ED provider notes, hospitalist notes, last 24 h vitals and pain scores, last 48 h intake and output, last 24 h labs and trends, and last 24 h imaging results.  Almarie GORMAN Pringle, PA-C [facility] 05/05/2024, 8:05 AM Please see Amion for pager number during day hours 7:00am-4:30pm or 7:00am -11:30am on weekends     [1]  Allergies Allergen Reactions   Amoxicillin-Pot Clavulanate Rash   Codeine Other (See Comments)    Stomach upset   "

## 2024-05-05 NOTE — H&P (View-Only) (Signed)
 "  Cardiology Consultation   Patient ID: RIYANSH GERSTNER MRN: [id number]; DOB: [birth year]  Admit date: 05/04/2024 Date of Consult: 05/05/2024  PCP:  Cleotilde Planas, MD   [facility] Providers Cardiologist:  Redell Shallow, MD        Patient Profile: Alan Willis is a 79 y.o. 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 Joette Pebbles.  History of Present Illness: Mr. Bissonette has medical history as stated above. Echo in 02/2023 showed LVEF 40-45%, severe BAE, mod-severe MS, mild-mod MR, prior mechanical aortic valve. Decreased EF thought to be secondary to tachycardia at that time. Patient underwent cardioversion but atrial flutter recurred and he was felt to be a failure of Tikosyn . He was then initiated on amiodarone . Follow-up monitor demonstrated persistent atrial flutter; rate control was pursued due to patient being asymptomatic. Repeat monitor in 10/2023 showed sinus rhythm, transient junctional rhythm, PACs, PVCs, and rare couplet. Echocardiogram in 10/2023 showed LVEF 40-45%, mild LV enlargement, mild LVH, mild RV dysfunction, severe BAE, mod-severe MR, mod-severe MS with mean gradient 7 mmHg, s/p aortic valve replacement with trace AI and mean gradient 16.7 mmHg. Follows with outpatient cardiology (Dr. Shallow), last seen 01/30/24 at which time he was seen for routine follow-up and was overall stable.  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 No evidence of malignancy and no other clear etiology noted in blood work.  Has been on steroid therapy since 01/2024.  Presented on 1/19 with complaining of several days of periumbilical/suprapubic abdominal pain, nausea, dyspnea, and cough. On arrival was febrile to 100.26F, tachycardic to the 140s, tachypneic, and saturating in the mid 70s on room air. CT showed high-grade SBO with transition point in the low ventral midline abdomen and RUL/LLL PNA. NG tube placed, general surgery consulted, and patient was admitted for high-grade bowel obstruction and pneumonia. ED EKG showed atrial flutter with 2:1 AV conduction, s/p diltiazem  bolus and started on amiodarone  bolus/gtt. Cardiology consulted for evaluation and management of atrial fibrillation/flutter with RVR and heart failure.  Relevant workup this admission: ProBNP 4,570 Hs-troponins 86 >> 74 Improving leukocytosis 28.1 >> 14.9 CMP with elevated creatinine, slightly above baseline (1.3-1.4). K and mag normal Respiratory panel negative Lactic acid 2.0 >> 1.8, NGTD on blood culture CXR showed mild-mod R mid lung infiltrate, mild L basilar atelectasis and/or infiltrate EKG and CT chest/abd/pelv as above  Upon speaking to the patient today, he states that he is feeling significantly better. Denies any chest pain, dyspnea, palpitations, lower extremity swelling. States that he is compliant on amiodarone  and warfarin at home, as well as daily Lasix  20 mg. States that he does not typically experience any lower extremity swelling until the past several days preceding his presentation to the ED. On chronic steroid therapy since October/November. Patient is hard of hearing; wife [name] at bedside to assist with history.   Past Medical History:  Diagnosis Date   A-fib [facility])    Atrial flutter with rapid  ventricular response (HCC) 03/16/2021   Cholelithiasis 2011    Chronic anticoagulation 03/03/2023   Colon, diverticulosis    H/O heart valve replacement with mechanical valve 03/03/2023   Bjork-Shiley tilting disc valve in 1983 at [facility] in [place], [place] .        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   Stricture of sigmoid colon s/p colectomy 2007 2007    Past Surgical History:  Procedure Laterality Date   ATRIAL FIBRILLATION ABLATION     BIOPSY  03/05/2023   Procedure: BIOPSY;  Surgeon: Saintclair Jasper, MD;  Location: [facility];  Service: Gastroenterology;;   CARDIOVERSION N/A 03/13/2023   Procedure: CARDIOVERSION;  Surgeon: Alvan Ronal BRAVO, MD;  Location: [facility];  Service: Cardiovascular;  Laterality: N/A;   CARDIOVERSION N/A 03/13/2023   Procedure: CARDIOVERSION;  Surgeon: Alvan Ronal BRAVO, MD;  Location: [facility];  Service: Cardiovascular;  Laterality: N/A;   COLON RESECTION SIGMOID  07/26/2005   Dr Eletha for diverticular sigmoid colon stricture   ESOPHAGOGASTRODUODENOSCOPY (EGD) WITH PROPOFOL  N/A 03/05/2023   Procedure: ESOPHAGOGASTRODUODENOSCOPY (EGD) WITH PROPOFOL ;  Surgeon: Saintclair Jasper, MD;  Location: [facility];  Service: Gastroenterology;  Laterality: N/A;   HEMOSTASIS CLIP PLACEMENT  03/05/2023   Procedure: HEMOSTASIS CLIP PLACEMENT;  Surgeon: Saintclair Jasper, MD;  Location: [facility];  Service: Gastroenterology;;   LAPAROSCOPIC INGUINAL HERNIA REPAIR Bilateral 04/03/1999   Dr Fredrik   VALVE REPLACEMENT         Scheduled Meds:  levETIRAcetam   500 mg Intravenous q morning   levETIRAcetam   750 mg Intravenous QPM   methylPREDNISolone  (SOLU-MEDROL ) injection  16 mg Intravenous Daily   Continuous Infusions:  sodium chloride  40 mL/hr at 05/05/24 1205   amiodarone  30 mg/hr (05/05/24 1231)   cefTRIAXone  (ROCEPHIN )  IV 2 g (05/05/24 1211)   doxycycline  (VIBRAMYCIN ) IV 100 mg (05/05/24 1012)   PRN Meds: acetaminophen  **OR** acetaminophen , fentaNYL  (SUBLIMAZE )  injection, naLOXone  (NARCAN )  injection, sodium chloride  HYPERTONIC  Allergies:   Allergies[1]  Social History:   Social History   Socioeconomic History   Marital status: Married    Spouse name: Not on file   Number of children: Not on file   Years of education: Not on file   Highest education level: Not on file  Occupational History   Not on file  Tobacco Use   Smoking status: Some Days    Types: Cigars   Smokeless tobacco: Never   Tobacco comments:    Cigars once a week use  Vaping Use   Vaping status: Never Used  Substance and Sexual Activity   Alcohol use: Not Currently   Drug use: Never   Sexual activity: Not on file  Other Topics Concern   Not on file  Social History Narrative   Right handed    Lives with family    Retired    Drinks a etoh free beer a day    Social Drivers of Health   Tobacco Use: High Risk (05/04/2024)   Patient History    Smoking Tobacco Use: Some Days    Smokeless Tobacco Use: Never    Passive Exposure: Not on file  Financial Resource Strain: Not on file  Food Insecurity: No Food Insecurity (05/04/2024)   Epic    Worried About Programme Researcher, Broadcasting/film/video in the Last Year: Never true    Ran Out of Food in the Last Year: Never true  Transportation Needs: No Transportation Needs (05/04/2024)   Epic  Lack of Transportation (Medical): No    Lack of Transportation (Non-Medical): No  Physical Activity: Not on file  Stress: Not on file  Social Connections: Socially Integrated (05/04/2024)   Social Connection and Isolation Panel    Frequency of Communication with Friends and Family: More than three times a week    Frequency of Social Gatherings with Friends and Family: Three times a week    Attends Religious Services: More than 4 times per year    Active Member of Clubs or Organizations: Yes    Attends Banker Meetings: More than 4 times per year    Marital Status: Married  Catering Manager Violence: Not At Risk (05/04/2024)   Epic     Fear of Current or Ex-Partner: No    Emotionally Abused: No    Physically Abused: No    Sexually Abused: No  Depression (PHQ2-9): Low Risk (11/06/2023)   Depression (PHQ2-9)    PHQ-2 Score: 0  Alcohol Screen: Not on file  Housing: Low Risk (05/04/2024)   Epic    Unable to Pay for Housing in the Last Year: No    Number of Times Moved in the Last Year: 0    Homeless in the Last Year: No  Utilities: Not At Risk (05/04/2024)   Epic    Threatened with loss of utilities: No  Health Literacy: Not on file    Family History:   History reviewed. No pertinent family history.   ROS:  Please see the history of present illness.   All other ROS reviewed and negative.     Physical Exam/Data: Vitals:   05/04/24 2200 05/04/24 2321 05/05/24 0820 05/05/24 0841  BP:   91/71 105/74  Pulse:   83 (!) 52  Resp:   20 20  Temp:   98 F (36.7 C)   TempSrc:   Oral   SpO2:   94% 93%  Weight: 75.1 kg 75.1 kg    Height:  5' 9 (1.753 m)      Intake/Output Summary (Last 24 hours) at 05/05/2024 1233 Last data filed at 05/05/2024 1050 Gross per 24 hour  Intake 1540.28 ml  Output 1200 ml  Net 340.28 ml      05/04/2024   11:21 PM 05/04/2024   10:00 PM 04/22/2024   12:11 PM  Last 3 Weights  Weight (lbs) 165 lb 9.1 oz 165 lb 9.1 oz 173 lb 4.8 oz  Weight (kg) 75.1 kg 75.1 kg 78.608 kg     Body mass index is 24.45 kg/m.  General: Well nourished, well developed elderly male sitting upright in bed, on 2 L/min O2 via [place], in no acute distress Neck: No JVD Vascular: Distal pulses 2+ bilaterally Cardiac: Irregularly irregular, tachycardic, normal S1, mechanical S2; 2/6 holosystolic mumur best heard at the apex Lungs: Decreased breath sounds in RUL and LLL Abd: Soft, nontender Ext: 1+ pitting edema bilaterally Musculoskeletal: No deformities Skin: Warm and dry  Neuro: CNs 2-12 intact, no focal abnormalities noted Psych: Normal affect   EKG: The EKG was personally reviewed and demonstrates: Atrial  flutter with 2:1 AV conduction with RVR, 135 bpm Telemetry: Telemetry was personally reviewed and demonstrates: Atrial fibrillation/flutter with RVR in the 110s  Relevant CV Studies:  Cardiac monitor [11/12/2023]: Patch Wear Time:  3 days and 19 hours (2025-07-14T10:02:09-398 to 2025-07-18T05:53:16-0400) Patient had a min HR of 42 bpm, max HR of 92 bpm, and avg HR of 53 bpm. Predominant underlying rhythm was Sinus Rhythm. First Degree AV Block was  present. Bundle Branch Block/IVCD was present. Junctional Rhythm was present. Isolated SVEs were occasional (2.1%, 6127), SVE Couplets were rare (<1.0%, 54), and SVE Triplets were rare (<1.0%, 1). Isolated VEs were rare (<1.0%), VE Couplets were rare (<1.0%), and no VE Triplets were present. Ventricular Trigeminy was present. Sinus bradycardia, normal sinus rhythm, transient junctional rhythm, PACs, PVCs and rare couplet.  Echo [11/01/2023]: 1. Left ventricular ejection fraction, by estimation, is 40 to 45% . The left ventricle has mildly decreased function. The left ventricle demonstrates global hypokinesis. The left ventricular internal cavity size was mildly dilated. There is mild left ventricular hypertrophy. Left ventricular diastolic function could not be evaluated. The average left ventricular global longitudinal strain is - 13. 6 % . The global longitudinal strain is abnormal. 2. Right ventricular systolic function is mildly reduced. The right ventricular size is mildly enlarged. There is moderately elevated pulmonary artery systolic pressure. 3. Left atrial size was severely dilated. 4. Right atrial size was severely dilated. 5. The mitral valve is degenerative. Moderate to severe mitral valve regurgitation. 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 The mean mitral valve gradient is 7. 0 mmHg. Severe mitral annular calcification. 6. The aortic valve has been repaired/ replaced. Aortic valve regurgitation is trivial. There is a unknown ball and cage valve  present in the aortic position. Aortic valve mean gradient measures 16. 7 mmHg. 7. Aortic dilatation noted. There is borderline dilatation of the ascending aorta, measuring 39 mm. 8. The inferior vena cava is dilated in size with < 50% respiratory variability, suggesting right atrial pressure of 15 mmHg.  Cardiac CT [03/11/23]: 1. Mild global cardiomegaly with left atrial enlargement. 2. Subpleural consolidation within the posterior basal left lower lobe may reflect changes of acute lobar pneumonia in the acute setting, resolving pulmonary infarct or pneumonic consolidation is subacute setting, or post inflammatory or post traumatic fibrotic change if chronic. Follow-up imaging in 3 months would be helpful in documenting resolution or stability. 3. Trace left pleural effusion.   Laboratory Data: High Sensitivity Troponin:  No results for input(s): TROPONINIHS in the last 720 hours.  Recent Labs  Lab 05/04/24 1027 05/05/24 0420  TRNPT 86* 74*      Chemistry Recent Labs  Lab 05/04/24 1027 05/05/24 0420  NA 140 140  K 3.9 4.1  CL 99 101  CO2 25 26  GLUCOSE 120* 91  BUN 20 28*  CREATININE 1.34* 1.49*  CALCIUM  9.9 8.5*  MG  --  2.2  GFRNONAA 55* 48*  ANIONGAP 16* 12    Recent Labs  Lab 05/05/24 0420  PROT 5.9*  ALBUMIN  3.5  AST 22  ALT 28  ALKPHOS 75  BILITOT 2.2*   Lipids No results for input(s): CHOL, TRIG, HDL, LABVLDL, LDLCALC, CHOLHDL in the last 168 hours.  Hematology Recent Labs  Lab 05/04/24 1027 05/05/24 0420  WBC 28.1* 14.9*  RBC 5.48 4.48  HGB 16.5 13.7  HCT 49.8 41.2  MCV 90.9 92.0  MCH 30.1 30.6  MCHC 33.1 33.3  RDW 18.9* 18.3*  PLT 139* 82*   Thyroid  No results for input(s): TSH, FREET4 in the last 168 hours.  BNP Recent Labs  Lab 05/04/24 1027  PROBNP 4,570.0*    DDimer  Recent Labs  Lab 05/04/24 1027  DDIMER 0.68*    Radiology/Studies:  DG Abdomen 1 View Result Date: 05/04/2024 EXAM: 1 VIEW XRAY OF THE  ABDOMEN 05/04/2024 02:18:47 PM COMPARISON: CT abdomen and pelvis 05/04/2024. CLINICAL HISTORY: confirm NG FINDINGS: LINES, TUBES AND DEVICES: Enteric  tube in place below the diaphragm with tip in the mid stomach. BOWEL: Dilated small bowel loops in visualized upper abdomen. SOFT TISSUES: No abnormal calcifications. BONES: No acute fracture. LUNGS: Patchy airspace opacity in the right mid lung. IMPRESSION: 1. Enteric tube tip terminates in the mid stomach. 2. Dilated small bowel loops in the upper abdomen. 3. Patchy right mid lung airspace opacity. Electronically signed by: Greig Pique MD 05/04/2024 03:17 PM EST RP Workstation: [id number]   CT Angio Chest PE W and/or Wo Contrast Result Date: 05/04/2024 CLINICAL DATA:  Pulmonary embolism suspected, high probability. Abdominal pain, nausea, difficulty breathing, cough. Leg and ankle swelling. Low O2 sat. EXAM: CT ANGIOGRAPHY CHEST CT ABDOMEN AND PELVIS WITH CONTRAST TECHNIQUE: Multidetector CT imaging of the chest was performed using the standard protocol during bolus administration of intravenous contrast. Multiplanar CT image reconstructions and MIPs were obtained to evaluate the vascular anatomy. Multidetector CT imaging of the abdomen and pelvis was performed using the standard protocol during bolus administration of intravenous contrast. RADIATION DOSE REDUCTION: This exam was performed according to the departmental dose-optimization program which includes automated exposure control, adjustment of the mA and/or kV according to patient size and/or use of iterative reconstruction technique. CONTRAST:  OMNIPAQUE  IOHEXOL  350 MG/ML SOLN COMPARISON:  CT chest 11/05/2023 and CT abdomen pelvis 01/02/2024. FINDINGS: CTA CHEST FINDINGS Cardiovascular: Image quality is degraded by respiratory motion. Negative for pulmonary embolus. Atherosclerotic calcification of the aorta and coronary arteries. Aortic valve replacement. Mitral annulus calcification. Enlarged  pulmonic trunk and heart. No pericardial effusion. Ascending aorta measures 3.8 cm (7/89). Mediastinum/Nodes: Thoracic inlet lymph nodes are not enlarged by CT size criteria. Mediastinal lymph nodes measure up to 1.7 cm in the low right paratracheal station, minimally larger but present dating back to 04/14/2020. 1.6 cm right hilar lymph node. No left hilar or axillary adenopathy. Air in fluid in the esophagus are indicative of dysmotility. Lungs/Pleura: Image quality is degraded by expiratory phase imaging, creating added density in the lungs. Patchy rounded consolidation in the right upper lobe. Additional rounded areas of consolidation in the left lower lobe. Nodule in the inferior posterolateral left lower lobe is seen along the left hemidiaphragm and measures 1.3 x 1.6 cm (6/100), unchanged from 04/14/2020 and considered benign. No pleural fluid. Airway is unremarkable. Musculoskeletal: Degenerative changes in the spine. Chronic mild compression of the T4 and T5 superior endplates. Review of the MIP images confirms the above findings. CT ABDOMEN and PELVIS FINDINGS Hepatobiliary: Liver is enlarged, 19.9 cm. Gallstone. No biliary ductal dilatation. Pancreas: Negative. Spleen: Enlarged, 15.2 cm. Adrenals/Urinary Tract: Adrenal glands are unremarkable. Low-attenuation lesions in the kidneys. No specific follow-up necessary. Kidneys are otherwise unremarkable. Ureters are decompressed. Bladder is grossly unremarkable. Stomach/Bowel: Stomach is distended with fluid. Dilated and fluid-filled jejunum with a transition point in the ventral midline low abdomen (2/51). Remainder of the small bowel is acutely decompressed. There is unobstructed small bowel extending into a right inguinal hernia. Appendix and colon are unremarkable. Vascular/Lymphatic: Atherosclerotic calcification of the aorta. No pathologically enlarged lymph nodes. Reproductive: Prostate is slightly enlarged. Other: Bilateral inguinal hernia repair.  Large right inguinal hernia contains unobstructed small bowel. Small left inguinal hernia contains fat. Trace interloop fluid. No mesenteric edema. Musculoskeletal: Degenerative changes in the spine. Review of the MIP images confirms the above findings. IMPRESSION: 1. Negative for pulmonary embolus. 2. High-grade small bowel obstruction with transition point in the low ventral midline abdomen. Minimal associated interloop fluid. No mesenteric edema. 3. Right upper and left  lower lobe pneumonia. Associated enlargement of mediastinal lymph nodes, likely reactive in etiology. 4. Bilateral inguinal hernia repair with a large right inguinal hernia containing unobstructed small bowel and a small left inguinal hernia contains fat. 5. Mild hepatosplenomegaly, improved from 01/02/2024. 6. Cholelithiasis. 7. Mildly enlarged prostate. 8. Aortic atherosclerosis (ICD10-I70.0). Coronary artery calcification. 9. Enlarged pulmonic trunk, indicative of pulmonary arterial hypertension. Electronically Signed   By: Newell Eke M.D.   On: 05/04/2024 13:29   CT ABDOMEN PELVIS W CONTRAST Result Date: 05/04/2024 CLINICAL DATA:  Pulmonary embolism suspected, high probability. Abdominal pain, nausea, difficulty breathing, cough. Leg and ankle swelling. Low O2 sat. EXAM: CT ANGIOGRAPHY CHEST CT ABDOMEN AND PELVIS WITH CONTRAST TECHNIQUE: Multidetector CT imaging of the chest was performed using the standard protocol during bolus administration of intravenous contrast. Multiplanar CT image reconstructions and MIPs were obtained to evaluate the vascular anatomy. Multidetector CT imaging of the abdomen and pelvis was performed using the standard protocol during bolus administration of intravenous contrast. RADIATION DOSE REDUCTION: This exam was performed according to the departmental dose-optimization program which includes automated exposure control, adjustment of the mA and/or kV according to patient size and/or use of iterative  reconstruction technique. CONTRAST:  OMNIPAQUE  IOHEXOL  350 MG/ML SOLN COMPARISON:  CT chest 11/05/2023 and CT abdomen pelvis 01/02/2024. FINDINGS: CTA CHEST FINDINGS Cardiovascular: Image quality is degraded by respiratory motion. Negative for pulmonary embolus. Atherosclerotic calcification of the aorta and coronary arteries. Aortic valve replacement. Mitral annulus calcification. Enlarged pulmonic trunk and heart. No pericardial effusion. Ascending aorta measures 3.8 cm (7/89). Mediastinum/Nodes: Thoracic inlet lymph nodes are not enlarged by CT size criteria. Mediastinal lymph nodes measure up to 1.7 cm in the low right paratracheal station, minimally larger but present dating back to 04/14/2020. 1.6 cm right hilar lymph node. No left hilar or axillary adenopathy. Air in fluid in the esophagus are indicative of dysmotility. Lungs/Pleura: Image quality is degraded by expiratory phase imaging, creating added density in the lungs. Patchy rounded consolidation in the right upper lobe. Additional rounded areas of consolidation in the left lower lobe. Nodule in the inferior posterolateral left lower lobe is seen along the left hemidiaphragm and measures 1.3 x 1.6 cm (6/100), unchanged from 04/14/2020 and considered benign. No pleural fluid. Airway is unremarkable. Musculoskeletal: Degenerative changes in the spine. Chronic mild compression of the T4 and T5 superior endplates. Review of the MIP images confirms the above findings. CT ABDOMEN and PELVIS FINDINGS Hepatobiliary: Liver is enlarged, 19.9 cm. Gallstone. No biliary ductal dilatation. Pancreas: Negative. Spleen: Enlarged, 15.2 cm. Adrenals/Urinary Tract: Adrenal glands are unremarkable. Low-attenuation lesions in the kidneys. No specific follow-up necessary. Kidneys are otherwise unremarkable. Ureters are decompressed. Bladder is grossly unremarkable. Stomach/Bowel: Stomach is distended with fluid. Dilated and fluid-filled jejunum with a transition point  in the ventral midline low abdomen (2/51). Remainder of the small bowel is acutely decompressed. There is unobstructed small bowel extending into a right inguinal hernia. Appendix and colon are unremarkable. Vascular/Lymphatic: Atherosclerotic calcification of the aorta. No pathologically enlarged lymph nodes. Reproductive: Prostate is slightly enlarged. Other: Bilateral inguinal hernia repair. Large right inguinal hernia contains unobstructed small bowel. Small left inguinal hernia contains fat. Trace interloop fluid. No mesenteric edema. Musculoskeletal: Degenerative changes in the spine. Review of the MIP images confirms the above findings. IMPRESSION: 1. Negative for pulmonary embolus. 2. High-grade small bowel obstruction with transition point in the low ventral midline abdomen. Minimal associated interloop fluid. No mesenteric edema. 3. Right upper and left lower lobe pneumonia.  Associated enlargement of mediastinal lymph nodes, likely reactive in etiology. 4. Bilateral inguinal hernia repair with a large right inguinal hernia containing unobstructed small bowel and a small left inguinal hernia contains fat. 5. Mild hepatosplenomegaly, improved from 01/02/2024. 6. Cholelithiasis. 7. Mildly enlarged prostate. 8. Aortic atherosclerosis (ICD10-I70.0). Coronary artery calcification. 9. Enlarged pulmonic trunk, indicative of pulmonary arterial hypertension. Electronically Signed   By: Newell Eke M.D.   On: 05/04/2024 13:29   US  Venous Img Lower Bilateral Result Date: 05/04/2024 EXAM: ULTRASOUND DUPLEX OF THE BILATERAL LOWER EXTREMITY VEINS TECHNIQUE: Duplex ultrasound using B-mode/gray scaled imaging and Doppler spectral analysis and color flow was obtained of the deep venous structures of the bilateral lower extremity. COMPARISON: None available. CLINICAL HISTORY: leg swelling FINDINGS: LEFT: The common femoral vein, femoral vein, popliteal vein, and posterior tibial vein demonstrate normal compressibility  with normal color flow and spectral analysis. RIGHT: The common femoral vein, femoral vein, popliteal vein, and posterior tibial vein demonstrate normal compressibility with normal color flow and spectral analysis. IMPRESSION: 1. No evidence of deep venous thrombosis. Electronically signed by: Evalene Coho MD 05/04/2024 12:21 PM EST RP Workstation: [id number]   DG Chest Port 1 View Result Date: 05/04/2024 CLINICAL DATA:  Abdominal pain and nausea with difficulty breathing and mild cough for few days. EXAM: PORTABLE CHEST 1 VIEW COMPARISON:  None Available. FINDINGS: Multiple sternal wires are present. The cardiac silhouette is mildly enlarged and unchanged in size. An artificial cardiac valve is seen. Mild to moderate severity infiltrate is seen within the periphery of the mid right lung. Mild left basilar atelectasis and/or infiltrate is also present. No pleural effusion or pneumothorax is identified. Multilevel degenerative changes seen throughout the thoracic spine. IMPRESSION: 1. Mild to moderate severity right mid lung infiltrate. 2. Mild left basilar atelectasis and/or infiltrate. Electronically Signed   By: Suzen Dials M.D.   On: 05/04/2024 11:32     Assessment and Plan:  Atrial fibrillation/flutter with RVR History of multiple cardioversions, most recently in 02/2023. Felt to be Tikosyn  failure at the time, instead on amiodarone  100 mg daily at home. 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 Given IV diltiazem  10 mg in the ED and started on amiodarone  bolus/drip Telemetry shows atrial fibrillation/flutter with RVR in the 110s Continue amiodarone  gtt and IV heparin   Acute on chronic HFmrEF Presented with several days of dyspnea and cough.  ProBNP  4,570 CT chest showed RUL/LLL PNA No pulmonary edema on imaging Last echo 11/01/23 showed LVEF 40-45%, global HK, mild LVH, mildly reduced RV systolic function, RVSP 59.9 mmHg, severe BAE, mod-severe MR, mod-severe MS with mean gradient 7.0 mmHg, s/p AV replacement with mean gradient 16.7 mmHg Home meds: jardiance  10 mg daily, Lasix  20 mg daily S/p IV Lasix  40 mg x 1 dose in the ED Suspect lower extremity edema likely secondary to chronic steroid therapy rather than ADHF. No evidence of pulmonary edema on imaging. No evidence of hypervolemia on exam otherwise, therefore no indication for diuresis at this time Monitor strict I/O, daily BMPs, and daily weights  Sepsis secondary to multifocal pneumonia Acute hypoxemic respiratory failure, in the setting of PNA and ADHF Presented with fever 100.2 F, tachycardia, tachypnea, and leukocytosis Lactate 2.0 >> 1.8, soft BP/no hypotension CT chest c/w RUL/LLL pneumonia, respiratory panel negative On ceftriaxone  and doxycycline .  Avoid macrolides due to QT prolongation Ongoing management per primary  High-grade small bowel obstruction Presented with several days of nausea and lower abdominal pain CT showed high-grade SBO with transition point in the low ventral midline abdomen, minimal associated interloop liquid NG tube in place, general surgery following Ongoing management per primary  S/p mechanical aortic valve replacement Severe mitral regurgitation, severe mitral stenosis Last echo as above Warfarin as home anticoagulation Continue IV heparin   Per primary Sarcoidosis/chronic thrombocytopenia Seizure disorder   Risk Assessment/Risk Scores:       [place]  Heart Association (NYHA) Functional Class NYHA Class II  CHA2DS2-VASc Score = 3   This indicates a 3.2% annual risk of stroke. The patient's score is based upon: CHF History: 1 HTN History: 0 Diabetes History: 0 Stroke History: 0 Vascular Disease History: 0 Age Score:  2 Gender Score: 0      For questions or updates, please contact [facility] Please consult [email or web address] for contact info under      Signed, Owen MARLA Daniels, PA-C  05/05/2024 12:33 PM     [1]  Allergies Allergen Reactions   Amoxicillin-Pot Clavulanate Rash   Codeine Other (See Comments)    Stomach upset   "

## 2024-05-05 NOTE — Evaluation (Signed)
 RT Evaluate and Treat Note  05/05/2024   Breathing is (select one): Same as normal    The following was found on auscultation (select multiple):  Bilateral Breath Sounds: Diminished;Clear (05/05/24 0842)             Cough Assessment: Cough: Non-productive (05/05/24 0841)    Most Recent Chest Xray:... (DG Chest Port 1 View Result Date: 05/04/2024 CLINICAL DATA:  Abdominal pain and nausea with difficulty breathing and mild cough for few days. EXAM: PORTABLE CHEST 1 VIEW COMPARISON:  None Available. FINDINGS: Multiple sternal wires are present. The cardiac silhouette is mildly enlarged and unchanged in size. An artificial cardiac valve is seen. Mild to moderate severity infiltrate is seen within the periphery of the mid right lung. Mild left basilar atelectasis and/or infiltrate is also present. No pleural effusion or pneumothorax is identified. Multilevel degenerative changes seen throughout the thoracic spine. IMPRESSION: 1. Mild to moderate severity right mid lung infiltrate. 2. Mild left basilar atelectasis and/or infiltrate. Electronically Signed   By: Suzen Dials M.D.   On: 05/04/2024 11:32      The following medications and/or interventions were ordered/changed/discontinued as part of the Respiratory Treatment protocol:   Medication Changes: No Change   Airway Clearance Changes: No Change   Oxygen Therapy Changes:No Change

## 2024-05-05 NOTE — Progress Notes (Signed)
 PHARMACY - ANTICOAGULATION CONSULT NOTE  Pharmacy Consult for heparin  Indication: afib + mAVR (PTA warfarin on hold)  Allergies[1]  Patient Measurements: Height: 5' 9 (175.3 cm) Weight: 75.1 kg (165 lb 9.1 oz) IBW/kg (Calculated) : 70.7 HEPARIN  DW (KG): 75.1  Vital Signs: Temp: 98.8 F (37.1 C) (01/20 1259) Temp Source: Oral (01/20 1259) BP: 100/71 (01/20 1259) Pulse Rate: 91 (01/20 1259)  Labs: Recent Labs    05/04/24 1027 05/05/24 0420  HGB 16.5 13.7  HCT 49.8 41.2  PLT 139* 82*  LABPROT 26.0* 28.3*  INR 2.3* 2.5*  CREATININE 1.34* 1.49*    Estimated Creatinine Clearance: 41.5 mL/min (A) (by C-G formula based on SCr of 1.49 mg/dL (H)).   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 Pharmacy consulted to dose heparin  while NPO. PMH includes warfarin for atrial fibrillation + mAVR (1983)  INR up to 2.5 this am (therapeutic), will hold heparin  drip today.  Goal of Therapy:  INR 2.5-3.5 Heparin  level 0.3-0.7 units/ml Monitor platelets by anticoagulation protocol: Yes   Plan:  Stop heparin , resume once INR <2.5 Daily protime  Alan Willis, PharmD, BCPS, [facility] Clinical Pharmacist [phone] Please check AMION for all [facility] Pharmacy numbers 05/05/2024         [1]  Allergies Allergen Reactions   Amoxicillin-Pot  Clavulanate Rash   Codeine Other (See Comments)    Stomach upset

## 2024-05-05 NOTE — Progress Notes (Signed)
 PHARMACY - ANTICOAGULATION CONSULT NOTE  Pharmacy Consult for heparin  Indication: afib + mAVR (PTA warfarin on hold)  Allergies[1]  Patient Measurements: Height: 5' 9 (175.3 cm) Weight: 75.1 kg (165 lb 9.1 oz) IBW/kg (Calculated) : 70.7 HEPARIN  DW (KG): 75.1  Vital Signs: Temp: 98.3 F (36.8 C) (01/19 2100) Temp Source: Oral (01/19 2100) BP: 104/63 (01/19 2100) Pulse Rate: 87 (01/19 2100)  Labs: Recent Labs    05/04/24 1027  HGB 16.5  HCT 49.8  PLT 139*  LABPROT 26.0*  INR 2.3*  CREATININE 1.34*    Estimated Creatinine Clearance: 46.2 mL/min (A) (by C-G formula based on SCr of 1.34 mg/dL (H)).   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 Pharmacy consulted to dose heparin  while NPO. PMH includes warfarin for atrial fibrillation + mAVR (1983)  -Hgb WNL, plts 139 (hx of chronic thrombocytopenia-unclear etiology) -Last dose of warfarin on 1/19 PM -INR 2.3-below goal will start heparin   Goal of Therapy:  INR 2.5-3.5 Heparin  level 0.3-0.7 units/ml Monitor platelets by anticoagulation protocol: Yes   Plan:  No bolus given recent warfarin use Start heparin  infusion at 1250 units/hr Check anti-Xa level in 8 hours and daily while on heparin  Continue to monitor H&H and platelets  Lynwood Poplar, PharmD, BCPS Clinical  Pharmacist 05/05/2024 2:32 AM        [1]  Allergies Allergen Reactions   Amoxicillin-Pot Clavulanate Rash   Codeine Other (See Comments)    Stomach upset

## 2024-05-05 NOTE — Consult Note (Signed)
 "  Cardiology Consultation   Patient ID: Alan Willis MRN: [id number]; DOB: Aug 18, 1945  Admit date: 05/04/2024 Date of Consult: 05/05/2024  PCP:  Cleotilde Planas, MD   [facility] Providers Cardiologist:  Redell Shallow, MD        Patient Profile: Alan Willis is a 79 y.o. 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 Joette Pebbles.  History of Present Illness: Alan Willis has medical history as stated above. Echo in 02/2023 showed LVEF 40-45%, severe BAE, mod-severe MS, mild-mod MR, prior mechanical aortic valve. Decreased EF thought to be secondary to tachycardia at that time. Patient underwent cardioversion but atrial flutter recurred and he was felt to be a failure of Tikosyn . He was then initiated on amiodarone . Follow-up monitor demonstrated persistent atrial flutter; rate control was pursued due to patient being asymptomatic. Repeat monitor in 10/2023 showed sinus rhythm, transient junctional rhythm, PACs, PVCs, and rare couplet. Echocardiogram in 10/2023 showed LVEF 40-45%, mild LV enlargement, mild LVH, mild RV dysfunction, severe BAE, mod-severe MR, mod-severe MS with mean gradient 7 mmHg, s/p aortic valve replacement with trace AI and mean gradient 16.7 mmHg. Follows with outpatient cardiology (Dr. Shallow), last seen 01/30/24 at which time he was seen for routine follow-up and was overall stable.  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 No evidence of malignancy and no other clear etiology noted in blood work.  Has been on steroid therapy since 01/2024.  Presented on 1/19 with complaining of several days of periumbilical/suprapubic abdominal pain, nausea, dyspnea, and cough. On arrival was febrile to 100.50F, tachycardic to the 140s, tachypneic, and saturating in the mid 70s on room air. CT showed high-grade SBO with transition point in the low ventral midline abdomen and RUL/LLL PNA. NG tube placed, general surgery consulted, and patient was admitted for high-grade bowel obstruction and pneumonia. ED EKG showed atrial flutter with 2:1 AV conduction, s/p diltiazem  bolus and started on amiodarone  bolus/gtt. Cardiology consulted for evaluation and management of atrial fibrillation/flutter with RVR and heart failure.  Relevant workup this admission: ProBNP 4,570 Hs-troponins 86 >> 74 Improving leukocytosis 28.1 >> 14.9 CMP with elevated creatinine, slightly above baseline (1.3-1.4). K and mag normal Respiratory panel negative Lactic acid 2.0 >> 1.8, NGTD on blood culture CXR showed mild-mod R mid lung infiltrate, mild L basilar atelectasis and/or infiltrate EKG and CT chest/abd/pelv as above  Upon speaking to the patient today, he states that he is feeling significantly better. Denies any chest pain, dyspnea, palpitations, lower extremity swelling. States that he is compliant on amiodarone  and warfarin at home, as well as daily Lasix  20 mg. States that he does not typically experience any lower extremity swelling until the past several days preceding his presentation to the ED. On chronic steroid therapy since October/November. Patient is hard of hearing; wife Alan Willis at bedside to assist with history.   Past Medical History:  Diagnosis Date   A-fib [facility])    Atrial flutter with rapid  ventricular response (HCC) 03/16/2021   Cholelithiasis 2011    Chronic anticoagulation 03/03/2023   Colon, diverticulosis    H/O heart valve replacement with mechanical valve 03/03/2023   Bjork-Shiley tilting disc valve in 1983 at [facility] in [place], [place] .        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   Stricture of sigmoid colon s/p colectomy 2007 2007    Past Surgical History:  Procedure Laterality Date   ATRIAL FIBRILLATION ABLATION     BIOPSY  03/05/2023   Procedure: BIOPSY;  Surgeon: Saintclair Jasper, MD;  Location: [facility];  Service: Gastroenterology;;   CARDIOVERSION N/A 03/13/2023   Procedure: CARDIOVERSION;  Surgeon: Alvan Ronal BRAVO, MD;  Location: [facility];  Service: Cardiovascular;  Laterality: N/A;   CARDIOVERSION N/A 03/13/2023   Procedure: CARDIOVERSION;  Surgeon: Alvan Ronal BRAVO, MD;  Location: [facility];  Service: Cardiovascular;  Laterality: N/A;   COLON RESECTION SIGMOID  07/26/2005   Dr Eletha for diverticular sigmoid colon stricture   ESOPHAGOGASTRODUODENOSCOPY (EGD) WITH PROPOFOL  N/A 03/05/2023   Procedure: ESOPHAGOGASTRODUODENOSCOPY (EGD) WITH PROPOFOL ;  Surgeon: Saintclair Jasper, MD;  Location: [facility];  Service: Gastroenterology;  Laterality: N/A;   HEMOSTASIS CLIP PLACEMENT  03/05/2023   Procedure: HEMOSTASIS CLIP PLACEMENT;  Surgeon: Saintclair Jasper, MD;  Location: [facility];  Service: Gastroenterology;;   LAPAROSCOPIC INGUINAL HERNIA REPAIR Bilateral 04/03/1999   Dr Fredrik   VALVE REPLACEMENT         Scheduled Meds:  levETIRAcetam   500 mg Intravenous q morning   levETIRAcetam   750 mg Intravenous QPM   methylPREDNISolone  (SOLU-MEDROL ) injection  16 mg Intravenous Daily   Continuous Infusions:  sodium chloride  40 mL/hr at 05/05/24 1205   amiodarone  30 mg/hr (05/05/24 1231)   cefTRIAXone  (ROCEPHIN )  IV 2 g (05/05/24 1211)   doxycycline  (VIBRAMYCIN ) IV 100 mg (05/05/24 1012)   PRN Meds: acetaminophen  **OR** acetaminophen , fentaNYL  (SUBLIMAZE )  injection, naLOXone  (NARCAN )  injection, sodium chloride  HYPERTONIC  Allergies:   Allergies[1]  Social History:   Social History   Socioeconomic History   Marital status: Married    Spouse name: Not on file   Number of children: Not on file   Years of education: Not on file   Highest education level: Not on file  Occupational History   Not on file  Tobacco Use   Smoking status: Some Days    Types: Cigars   Smokeless tobacco: Never   Tobacco comments:    Cigars once a week use  Vaping Use   Vaping status: Never Used  Substance and Sexual Activity   Alcohol use: Not Currently   Drug use: Never   Sexual activity: Not on file  Other Topics Concern   Not on file  Social History Narrative   Right handed    Lives with family    Retired    Drinks a etoh free beer a day    Social Drivers of Health   Tobacco Use: High Risk (05/04/2024)   Patient History    Smoking Tobacco Use: Some Days    Smokeless Tobacco Use: Never    Passive Exposure: Not on file  Financial Resource Strain: Not on file  Food Insecurity: No Food Insecurity (05/04/2024)   Epic    Worried About Programme Researcher, Broadcasting/film/video in the Last Year: Never true    Ran Out of Food in the Last Year: Never true  Transportation Needs: No Transportation Needs (05/04/2024)   Epic  Lack of Transportation (Medical): No    Lack of Transportation (Non-Medical): No  Physical Activity: Not on file  Stress: Not on file  Social Connections: Socially Integrated (05/04/2024)   Social Connection and Isolation Panel    Frequency of Communication with Friends and Family: More than three times a week    Frequency of Social Gatherings with Friends and Family: Three times a week    Attends Religious Services: More than 4 times per year    Active Member of Clubs or Organizations: Yes    Attends Banker Meetings: More than 4 times per year    Marital Status: Married  Catering Manager Violence: Not At Risk (05/04/2024)   Epic     Fear of Current or Ex-Partner: No    Emotionally Abused: No    Physically Abused: No    Sexually Abused: No  Depression (PHQ2-9): Low Risk (11/06/2023)   Depression (PHQ2-9)    PHQ-2 Score: 0  Alcohol Screen: Not on file  Housing: Low Risk (05/04/2024)   Epic    Unable to Pay for Housing in the Last Year: No    Number of Times Moved in the Last Year: 0    Homeless in the Last Year: No  Utilities: Not At Risk (05/04/2024)   Epic    Threatened with loss of utilities: No  Health Literacy: Not on file    Family History:   History reviewed. No pertinent family history.   ROS:  Please see the history of present illness.   All other ROS reviewed and negative.     Physical Exam/Data: Vitals:   05/04/24 2200 05/04/24 2321 05/05/24 0820 05/05/24 0841  BP:   91/71 105/74  Pulse:   83 (!) 52  Resp:   20 20  Temp:   98 F (36.7 C)   TempSrc:   Oral   SpO2:   94% 93%  Weight: 75.1 kg 75.1 kg    Height:  5' 9 (1.753 m)      Intake/Output Summary (Last 24 hours) at 05/05/2024 1233 Last data filed at 05/05/2024 1050 Gross per 24 hour  Intake 1540.28 ml  Output 1200 ml  Net 340.28 ml      05/04/2024   11:21 PM 05/04/2024   10:00 PM 04/22/2024   12:11 PM  Last 3 Weights  Weight (lbs) 165 lb 9.1 oz 165 lb 9.1 oz 173 lb 4.8 oz  Weight (kg) 75.1 kg 75.1 kg 78.608 kg     Body mass index is 24.45 kg/m.  General: Well nourished, well developed elderly male sitting upright in bed, on 2 L/min O2 via [place], in no acute distress Neck: No JVD Vascular: Distal pulses 2+ bilaterally Cardiac: Irregularly irregular, tachycardic, normal S1, mechanical S2; 2/6 holosystolic mumur best heard at the apex Lungs: Decreased breath sounds in RUL and LLL Abd: Soft, nontender Ext: 1+ pitting edema bilaterally Musculoskeletal: No deformities Skin: Warm and dry  Neuro: CNs 2-12 intact, no focal abnormalities noted Psych: Normal affect   EKG: The EKG was personally reviewed and demonstrates: Atrial  flutter with 2:1 AV conduction with RVR, 135 bpm Telemetry: Telemetry was personally reviewed and demonstrates: Atrial fibrillation/flutter with RVR in the 110s  Relevant CV Studies:  Cardiac monitor [11/12/2023]: Patch Wear Time:  3 days and 19 hours (2025-07-14T10:02:09-398 to 2025-07-18T05:53:16-0400) Patient had a min HR of 42 bpm, max HR of 92 bpm, and avg HR of 53 bpm. Predominant underlying rhythm was Sinus Rhythm. First Degree AV Block was  present. Bundle Branch Block/IVCD was present. Junctional Rhythm was present. Isolated SVEs were occasional (2.1%, 6127), SVE Couplets were rare (<1.0%, 54), and SVE Triplets were rare (<1.0%, 1). Isolated VEs were rare (<1.0%), VE Couplets were rare (<1.0%), and no VE Triplets were present. Ventricular Trigeminy was present. Sinus bradycardia, normal sinus rhythm, transient junctional rhythm, PACs, PVCs and rare couplet.  Echo [11/01/2023]: 1. Left ventricular ejection fraction, by estimation, is 40 to 45% . The left ventricle has mildly decreased function. The left ventricle demonstrates global hypokinesis. The left ventricular internal cavity size was mildly dilated. There is mild left ventricular hypertrophy. Left ventricular diastolic function could not be evaluated. The average left ventricular global longitudinal strain is - 13. 6 % . The global longitudinal strain is abnormal. 2. Right ventricular systolic function is mildly reduced. The right ventricular size is mildly enlarged. There is moderately elevated pulmonary artery systolic pressure. 3. Left atrial size was severely dilated. 4. Right atrial size was severely dilated. 5. The mitral valve is degenerative. Moderate to severe mitral valve regurgitation. 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 The mean mitral valve gradient is 7. 0 mmHg. Severe mitral annular calcification. 6. The aortic valve has been repaired/ replaced. Aortic valve regurgitation is trivial. There is a unknown ball and cage valve  present in the aortic position. Aortic valve mean gradient measures 16. 7 mmHg. 7. Aortic dilatation noted. There is borderline dilatation of the ascending aorta, measuring 39 mm. 8. The inferior vena cava is dilated in size with < 50% respiratory variability, suggesting right atrial pressure of 15 mmHg.  Cardiac CT [03/11/23]: 1. Mild global cardiomegaly with left atrial enlargement. 2. Subpleural consolidation within the posterior basal left lower lobe may reflect changes of acute lobar pneumonia in the acute setting, resolving pulmonary infarct or pneumonic consolidation is subacute setting, or post inflammatory or post traumatic fibrotic change if chronic. Follow-up imaging in 3 months would be helpful in documenting resolution or stability. 3. Trace left pleural effusion.   Laboratory Data: High Sensitivity Troponin:  No results for input(s): TROPONINIHS in the last 720 hours.  Recent Labs  Lab 05/04/24 1027 05/05/24 0420  TRNPT 86* 74*      Chemistry Recent Labs  Lab 05/04/24 1027 05/05/24 0420  NA 140 140  K 3.9 4.1  CL 99 101  CO2 25 26  GLUCOSE 120* 91  BUN 20 28*  CREATININE 1.34* 1.49*  CALCIUM  9.9 8.5*  MG  --  2.2  GFRNONAA 55* 48*  ANIONGAP 16* 12    Recent Labs  Lab 05/05/24 0420  PROT 5.9*  ALBUMIN  3.5  AST 22  ALT 28  ALKPHOS 75  BILITOT 2.2*   Lipids No results for input(s): CHOL, TRIG, HDL, LABVLDL, LDLCALC, CHOLHDL in the last 168 hours.  Hematology Recent Labs  Lab 05/04/24 1027 05/05/24 0420  WBC 28.1* 14.9*  RBC 5.48 4.48  HGB 16.5 13.7  HCT 49.8 41.2  MCV 90.9 92.0  MCH 30.1 30.6  MCHC 33.1 33.3  RDW 18.9* 18.3*  PLT 139* 82*   Thyroid  No results for input(s): TSH, FREET4 in the last 168 hours.  BNP Recent Labs  Lab 05/04/24 1027  PROBNP 4,570.0*    DDimer  Recent Labs  Lab 05/04/24 1027  DDIMER 0.68*    Radiology/Studies:  DG Abdomen 1 View Result Date: 05/04/2024 EXAM: 1 VIEW XRAY OF THE  ABDOMEN 05/04/2024 02:18:47 PM COMPARISON: CT abdomen and pelvis 05/04/2024. CLINICAL HISTORY: confirm NG FINDINGS: LINES, TUBES AND DEVICES: Enteric  tube in place below the diaphragm with tip in the mid stomach. BOWEL: Dilated small bowel loops in visualized upper abdomen. SOFT TISSUES: No abnormal calcifications. BONES: No acute fracture. LUNGS: Patchy airspace opacity in the right mid lung. IMPRESSION: 1. Enteric tube tip terminates in the mid stomach. 2. Dilated small bowel loops in the upper abdomen. 3. Patchy right mid lung airspace opacity. Electronically signed by: Greig Pique MD 05/04/2024 03:17 PM EST RP Workstation: [id number]   CT Angio Chest PE W and/or Wo Contrast Result Date: 05/04/2024 CLINICAL DATA:  Pulmonary embolism suspected, high probability. Abdominal pain, nausea, difficulty breathing, cough. Leg and ankle swelling. Low O2 sat. EXAM: CT ANGIOGRAPHY CHEST CT ABDOMEN AND PELVIS WITH CONTRAST TECHNIQUE: Multidetector CT imaging of the chest was performed using the standard protocol during bolus administration of intravenous contrast. Multiplanar CT image reconstructions and MIPs were obtained to evaluate the vascular anatomy. Multidetector CT imaging of the abdomen and pelvis was performed using the standard protocol during bolus administration of intravenous contrast. RADIATION DOSE REDUCTION: This exam was performed according to the departmental dose-optimization program which includes automated exposure control, adjustment of the mA and/or kV according to patient size and/or use of iterative reconstruction technique. CONTRAST:  OMNIPAQUE  IOHEXOL  350 MG/ML SOLN COMPARISON:  CT chest 11/05/2023 and CT abdomen pelvis 01/02/2024. FINDINGS: CTA CHEST FINDINGS Cardiovascular: Image quality is degraded by respiratory motion. Negative for pulmonary embolus. Atherosclerotic calcification of the aorta and coronary arteries. Aortic valve replacement. Mitral annulus calcification. Enlarged  pulmonic trunk and heart. No pericardial effusion. Ascending aorta measures 3.8 cm (7/89). Mediastinum/Nodes: Thoracic inlet lymph nodes are not enlarged by CT size criteria. Mediastinal lymph nodes measure up to 1.7 cm in the low right paratracheal station, minimally larger but present dating back to 04/14/2020. 1.6 cm right hilar lymph node. No left hilar or axillary adenopathy. Air in fluid in the esophagus are indicative of dysmotility. Lungs/Pleura: Image quality is degraded by expiratory phase imaging, creating added density in the lungs. Patchy rounded consolidation in the right upper lobe. Additional rounded areas of consolidation in the left lower lobe. Nodule in the inferior posterolateral left lower lobe is seen along the left hemidiaphragm and measures 1.3 x 1.6 cm (6/100), unchanged from 04/14/2020 and considered benign. No pleural fluid. Airway is unremarkable. Musculoskeletal: Degenerative changes in the spine. Chronic mild compression of the T4 and T5 superior endplates. Review of the MIP images confirms the above findings. CT ABDOMEN and PELVIS FINDINGS Hepatobiliary: Liver is enlarged, 19.9 cm. Gallstone. No biliary ductal dilatation. Pancreas: Negative. Spleen: Enlarged, 15.2 cm. Adrenals/Urinary Tract: Adrenal glands are unremarkable. Low-attenuation lesions in the kidneys. No specific follow-up necessary. Kidneys are otherwise unremarkable. Ureters are decompressed. Bladder is grossly unremarkable. Stomach/Bowel: Stomach is distended with fluid. Dilated and fluid-filled jejunum with a transition point in the ventral midline low abdomen (2/51). Remainder of the small bowel is acutely decompressed. There is unobstructed small bowel extending into a right inguinal hernia. Appendix and colon are unremarkable. Vascular/Lymphatic: Atherosclerotic calcification of the aorta. No pathologically enlarged lymph nodes. Reproductive: Prostate is slightly enlarged. Other: Bilateral inguinal hernia repair.  Large right inguinal hernia contains unobstructed small bowel. Small left inguinal hernia contains fat. Trace interloop fluid. No mesenteric edema. Musculoskeletal: Degenerative changes in the spine. Review of the MIP images confirms the above findings. IMPRESSION: 1. Negative for pulmonary embolus. 2. High-grade small bowel obstruction with transition point in the low ventral midline abdomen. Minimal associated interloop fluid. No mesenteric edema. 3. Right upper and left  lower lobe pneumonia. Associated enlargement of mediastinal lymph nodes, likely reactive in etiology. 4. Bilateral inguinal hernia repair with a large right inguinal hernia containing unobstructed small bowel and a small left inguinal hernia contains fat. 5. Mild hepatosplenomegaly, improved from 01/02/2024. 6. Cholelithiasis. 7. Mildly enlarged prostate. 8. Aortic atherosclerosis (ICD10-I70.0). Coronary artery calcification. 9. Enlarged pulmonic trunk, indicative of pulmonary arterial hypertension. Electronically Signed   By: Newell Eke M.D.   On: 05/04/2024 13:29   CT ABDOMEN PELVIS W CONTRAST Result Date: 05/04/2024 CLINICAL DATA:  Pulmonary embolism suspected, high probability. Abdominal pain, nausea, difficulty breathing, cough. Leg and ankle swelling. Low O2 sat. EXAM: CT ANGIOGRAPHY CHEST CT ABDOMEN AND PELVIS WITH CONTRAST TECHNIQUE: Multidetector CT imaging of the chest was performed using the standard protocol during bolus administration of intravenous contrast. Multiplanar CT image reconstructions and MIPs were obtained to evaluate the vascular anatomy. Multidetector CT imaging of the abdomen and pelvis was performed using the standard protocol during bolus administration of intravenous contrast. RADIATION DOSE REDUCTION: This exam was performed according to the departmental dose-optimization program which includes automated exposure control, adjustment of the mA and/or kV according to patient size and/or use of iterative  reconstruction technique. CONTRAST:  OMNIPAQUE  IOHEXOL  350 MG/ML SOLN COMPARISON:  CT chest 11/05/2023 and CT abdomen pelvis 01/02/2024. FINDINGS: CTA CHEST FINDINGS Cardiovascular: Image quality is degraded by respiratory motion. Negative for pulmonary embolus. Atherosclerotic calcification of the aorta and coronary arteries. Aortic valve replacement. Mitral annulus calcification. Enlarged pulmonic trunk and heart. No pericardial effusion. Ascending aorta measures 3.8 cm (7/89). Mediastinum/Nodes: Thoracic inlet lymph nodes are not enlarged by CT size criteria. Mediastinal lymph nodes measure up to 1.7 cm in the low right paratracheal station, minimally larger but present dating back to 04/14/2020. 1.6 cm right hilar lymph node. No left hilar or axillary adenopathy. Air in fluid in the esophagus are indicative of dysmotility. Lungs/Pleura: Image quality is degraded by expiratory phase imaging, creating added density in the lungs. Patchy rounded consolidation in the right upper lobe. Additional rounded areas of consolidation in the left lower lobe. Nodule in the inferior posterolateral left lower lobe is seen along the left hemidiaphragm and measures 1.3 x 1.6 cm (6/100), unchanged from 04/14/2020 and considered benign. No pleural fluid. Airway is unremarkable. Musculoskeletal: Degenerative changes in the spine. Chronic mild compression of the T4 and T5 superior endplates. Review of the MIP images confirms the above findings. CT ABDOMEN and PELVIS FINDINGS Hepatobiliary: Liver is enlarged, 19.9 cm. Gallstone. No biliary ductal dilatation. Pancreas: Negative. Spleen: Enlarged, 15.2 cm. Adrenals/Urinary Tract: Adrenal glands are unremarkable. Low-attenuation lesions in the kidneys. No specific follow-up necessary. Kidneys are otherwise unremarkable. Ureters are decompressed. Bladder is grossly unremarkable. Stomach/Bowel: Stomach is distended with fluid. Dilated and fluid-filled jejunum with a transition point  in the ventral midline low abdomen (2/51). Remainder of the small bowel is acutely decompressed. There is unobstructed small bowel extending into a right inguinal hernia. Appendix and colon are unremarkable. Vascular/Lymphatic: Atherosclerotic calcification of the aorta. No pathologically enlarged lymph nodes. Reproductive: Prostate is slightly enlarged. Other: Bilateral inguinal hernia repair. Large right inguinal hernia contains unobstructed small bowel. Small left inguinal hernia contains fat. Trace interloop fluid. No mesenteric edema. Musculoskeletal: Degenerative changes in the spine. Review of the MIP images confirms the above findings. IMPRESSION: 1. Negative for pulmonary embolus. 2. High-grade small bowel obstruction with transition point in the low ventral midline abdomen. Minimal associated interloop fluid. No mesenteric edema. 3. Right upper and left lower lobe pneumonia.  Associated enlargement of mediastinal lymph nodes, likely reactive in etiology. 4. Bilateral inguinal hernia repair with a large right inguinal hernia containing unobstructed small bowel and a small left inguinal hernia contains fat. 5. Mild hepatosplenomegaly, improved from 01/02/2024. 6. Cholelithiasis. 7. Mildly enlarged prostate. 8. Aortic atherosclerosis (ICD10-I70.0). Coronary artery calcification. 9. Enlarged pulmonic trunk, indicative of pulmonary arterial hypertension. Electronically Signed   By: Newell Eke M.D.   On: 05/04/2024 13:29   US  Venous Img Lower Bilateral Result Date: 05/04/2024 EXAM: ULTRASOUND DUPLEX OF THE BILATERAL LOWER EXTREMITY VEINS TECHNIQUE: Duplex ultrasound using B-mode/gray scaled imaging and Doppler spectral analysis and color flow was obtained of the deep venous structures of the bilateral lower extremity. COMPARISON: None available. CLINICAL HISTORY: leg swelling FINDINGS: LEFT: The common femoral vein, femoral vein, popliteal vein, and posterior tibial vein demonstrate normal compressibility  with normal color flow and spectral analysis. RIGHT: The common femoral vein, femoral vein, popliteal vein, and posterior tibial vein demonstrate normal compressibility with normal color flow and spectral analysis. IMPRESSION: 1. No evidence of deep venous thrombosis. Electronically signed by: Evalene Coho MD 05/04/2024 12:21 PM EST RP Workstation: [id number]   DG Chest Port 1 View Result Date: 05/04/2024 CLINICAL DATA:  Abdominal pain and nausea with difficulty breathing and mild cough for few days. EXAM: PORTABLE CHEST 1 VIEW COMPARISON:  None Available. FINDINGS: Multiple sternal wires are present. The cardiac silhouette is mildly enlarged and unchanged in size. An artificial cardiac valve is seen. Mild to moderate severity infiltrate is seen within the periphery of the mid right lung. Mild left basilar atelectasis and/or infiltrate is also present. No pleural effusion or pneumothorax is identified. Multilevel degenerative changes seen throughout the thoracic spine. IMPRESSION: 1. Mild to moderate severity right mid lung infiltrate. 2. Mild left basilar atelectasis and/or infiltrate. Electronically Signed   By: Suzen Dials M.D.   On: 05/04/2024 11:32     Assessment and Plan:  Atrial fibrillation/flutter with RVR History of multiple cardioversions, most recently in 02/2023. Felt to be Tikosyn  failure at the time, instead on amiodarone  100 mg daily at home. 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 Given IV diltiazem  10 mg in the ED and started on amiodarone  bolus/drip Telemetry shows atrial fibrillation/flutter with RVR in the 110s Continue amiodarone  gtt and IV heparin   Acute on chronic HFmrEF Presented with several days of dyspnea and cough.  ProBNP  4,570 CT chest showed RUL/LLL PNA No pulmonary edema on imaging Last echo 11/01/23 showed LVEF 40-45%, global HK, mild LVH, mildly reduced RV systolic function, RVSP 59.9 mmHg, severe BAE, mod-severe MR, mod-severe MS with mean gradient 7.0 mmHg, s/p AV replacement with mean gradient 16.7 mmHg Home meds: jardiance  10 mg daily, Lasix  20 mg daily S/p IV Lasix  40 mg x 1 dose in the ED Suspect lower extremity edema likely secondary to chronic steroid therapy rather than ADHF. No evidence of pulmonary edema on imaging. No evidence of hypervolemia on exam otherwise, therefore no indication for diuresis at this time Monitor strict I/O, daily BMPs, and daily weights  Sepsis secondary to multifocal pneumonia Acute hypoxemic respiratory failure, in the setting of PNA and ADHF Presented with fever 100.2 F, tachycardia, tachypnea, and leukocytosis Lactate 2.0 >> 1.8, soft BP/no hypotension CT chest c/w RUL/LLL pneumonia, respiratory panel negative On ceftriaxone  and doxycycline .  Avoid macrolides due to QT prolongation Ongoing management per primary  High-grade small bowel obstruction Presented with several days of nausea and lower abdominal pain CT showed high-grade SBO with transition point in the low ventral midline abdomen, minimal associated interloop liquid NG tube in place, general surgery following Ongoing management per primary  S/p mechanical aortic valve replacement Severe mitral regurgitation, severe mitral stenosis Last echo as above Warfarin as home anticoagulation Continue IV heparin   Per primary Sarcoidosis/chronic thrombocytopenia Seizure disorder   Risk Assessment/Risk Scores:       [place]  Heart Association (NYHA) Functional Class NYHA Class II  CHA2DS2-VASc Score = 3   This indicates a 3.2% annual risk of stroke. The patient's score is based upon: CHF History: 1 HTN History: 0 Diabetes History: 0 Stroke History: 0 Vascular Disease History: 0 Age Score:  2 Gender Score: 0      For questions or updates, please contact [facility] Please consult [email or web address] for contact info under      Signed, Owen MARLA Daniels, PA-C  05/05/2024 12:33 PM     [1]  Allergies Allergen Reactions   Amoxicillin-Pot Clavulanate Rash   Codeine Other (See Comments)    Stomach upset   "

## 2024-05-05 NOTE — H&P (View-Only) (Signed)
 Patient admitted earlier this morning.  H&P reviewed.  Patient seen and examined.  Patient appears to be comfortable on the bed this morning.  He denies any abdominal pain.  He says that he is passing gas from below.  Has not had a bowel movement yet.  Denies any shortness of breath this morning.  Has had a cough for few days.  Vital signs reviewed.  Noted to be stable.  He was on heated high flow at 10 L/min now on nasal cannula at 2 L/min.  Saturations are in the mid 90s.  Lungs reveal a few crackles on the right.  No wheezing or rhonchi.  Normal effort. S1-S2 is irregularly irregular. Abdomen is soft.  Bowel sounds sluggish but present.  Nontender.  No masses organomegaly.  No obvious focal neurological deficits.  Labs are reviewed from this morning.  Mostly stable.  He is noted to have leukocytosis.  He has thrombocytopenia which is chronic.  Creatinine is noted to be 1.49.  BUN is noted to be 28.  Hospitalized for multiple issues including small bowel obstruction, multifocal pneumonia.  He does have a history of congestive heart failure as well.  He has a history of atrial flutter and was noted to have RVR.  Cannot get oral medicines due to SBO.  Currently on amiodarone  infusion.  Cardiology is supposed to see the patient.  He is on warfarin for anticoagulation.  Currently on a heparin  infusion.  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  Since he is n.p.o. due to SBO we will place him on gentle IV hydration.  There was some concern for volume overload yesterday but no pulmonary edema noted on imaging studies.  Other issues as per H&P.  Continue to monitor.  Alan Willis 05/05/2024

## 2024-05-05 NOTE — Progress Notes (Signed)
 Contrast given via NG, Clamped afterwards. Xray called and aware.

## 2024-05-05 NOTE — H&P (Signed)
 " History and Physical    Alan Willis FMW:[id number] DOB: 1946/02/08 DOA: 05/04/2024  PCP: Cleotilde Planas, MD  Patient coming from: [facility] ED  Chief Complaint: Shortness of breath  HPI: Alan Willis is a 79 y.o. 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  Patient is reporting dyspnea on exertion and cough for the past few days and yesterday he had a fever at home.  Denies chest pain.  He also started having periumbilical abdominal pain yesterday but denies nausea, vomiting, or constipation.  He reports having daily bowel movements.  Not able to give any additional history.  ED Course: Tachycardic to the 140s, tachypneic to the mid 20s, and saturating in the mid 70s on room air on arrival.  Placed on 10 L HFNC.  Blood pressure 115/76 on arrival.  Febrile with temperature 100.2 F.  EKG showing atrial flutter.  Labs showing WBC count 28.1 with neutrophilic predominance, hemoglobin 16.5, platelet count 139k (improved), creatinine 1.34 (stable), proBNP 4570, D-dimer 0.68, troponin 86, INR 2.3, COVID/influenza/RSV PCR negative, UA not suggestive of infection, blood cultures in process, lactic acid 2.0> 1.8.  Bilateral lower extremity Doppler ultrasound negative for DVT.  CTA chest negative for PE but showing right upper and left lower lobe pneumonia with associated enlargement of mediastinal lymph nodes, likely reactive in etiology.  CT abdomen pelvis showing high-grade small bowel obstruction with transition point in the low ventral midline abdomen.  General surgery consulted (EDP spoke to [facility]) and NG tube placed.  Patient was given Tylenol ,  IV Cardizem  10 mg, fentanyl , IV Lasix  40 mg, azithromycin , ceftriaxone , and 1 L LR bolus.  He was given IV amiodarone  bolus and started on continuous infusion.  Review of Systems:  Review of Systems  All other systems reviewed and are negative.   Past Medical History:  Diagnosis Date   A-fib [facility])    Atrial flutter with rapid ventricular response (HCC) 03/16/2021   Cholelithiasis 2011   Chronic anticoagulation 03/03/2023   Colon, diverticulosis    H/O heart valve replacement with mechanical valve 03/03/2023   Bjork-Shiley tilting disc valve in 1983 at [facility] in [place], [place] .        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   Stricture of sigmoid colon s/p colectomy 2007 2007    Past Surgical History:  Procedure Laterality Date   ATRIAL FIBRILLATION ABLATION     BIOPSY  03/05/2023   Procedure: BIOPSY;  Surgeon: Saintclair Jasper, MD;  Location: [facility];  Service: Gastroenterology;;   CARDIOVERSION N/A 03/13/2023   Procedure: CARDIOVERSION;  Surgeon: Alvan Ronal BRAVO, MD;  Location: [facility];  Service: Cardiovascular;  Laterality: N/A;   CARDIOVERSION N/A 03/13/2023   Procedure: CARDIOVERSION;  Surgeon: Alvan Ronal BRAVO, MD;  Location: [facility];  Service: Cardiovascular;  Laterality: N/A;   COLON RESECTION SIGMOID  07/26/2005   Dr Eletha for diverticular sigmoid colon stricture   ESOPHAGOGASTRODUODENOSCOPY (EGD) WITH PROPOFOL  N/A 03/05/2023   Procedure: ESOPHAGOGASTRODUODENOSCOPY (EGD) WITH PROPOFOL ;  Surgeon: Saintclair Jasper, MD;  Location: [facility];  Service: Gastroenterology;  Laterality: N/A;   HEMOSTASIS CLIP PLACEMENT  03/05/2023   Procedure: HEMOSTASIS CLIP PLACEMENT;  Surgeon: Saintclair Jasper, MD;  Location: [facility];  Service: Gastroenterology;;   LAPAROSCOPIC INGUINAL HERNIA REPAIR Bilateral 04/03/1999   Dr Fredrik   VALVE REPLACEMENT       reports that he has been smoking cigars. He has never used smokeless  tobacco. He reports that he does not currently use alcohol. He reports that he does not use drugs.  Allergies[1]  History reviewed. No pertinent family history.  Prior to Admission medications  Medication Sig Start Date End Date Taking? Authorizing Provider  amiodarone  (PACERONE ) 200 MG tablet Take 100 mg by mouth daily.   Yes [provider]  empagliflozin  (JARDIANCE ) 10 MG TABS tablet Take 1 tablet (10 mg total) by mouth daily. 05/13/23  Yes Pietro Redell RAMAN, MD  furosemide  (LASIX ) 20 MG tablet Take 20 mg by mouth daily. 03/20/23  Yes [provider]  levETIRAcetam  (KEPPRA ) 500 MG tablet Take 1 tablet in AM and 1 and 1/2 tablets in PM 01/31/24  Yes Georjean Darice HERO, MD  rosuvastatin  (CRESTOR ) 20 MG tablet Take 1 tablet (20 mg total) by mouth daily. 09/17/23  Yes Pietro Redell RAMAN, MD  warfarin (COUMADIN ) 6 MG tablet Take 3-6 mg by mouth See admin instructions. Take 6 mg by mouth in the morning on Sun/Mon/Wed/Thurs/Fri/Sat and 3 mg on Tues Patient taking differently: Take 2-4 mg by mouth See admin instructions. Take 6 mg by mouth in the morning on Sun/Mon/Wed/Thurs/Fri/Sat and 3 mg on Tues   Yes [provider]  Cholecalciferol (VITAMIN D3) 1000 units CAPS Take 1,000 Units by mouth daily.    [provider]  ferrous sulfate 325 (65 FE) MG tablet Take 325 mg by mouth daily with breakfast.    [provider]  IMODIUM A-D 2 MG tablet Take 2 mg by mouth 4 (four) times daily as needed for diarrhea or loose stools.    [provider]  magnesium  oxide (MAG-OX) 400 (240 Mg) MG tablet Take 400 mg by mouth daily.    [provider]  ondansetron  (ZOFRAN ) 4 MG tablet Take 4 mg by mouth every 8 (eight) hours as needed. 04/12/23   [provider]  predniSONE  (DELTASONE ) 20 MG tablet TAKE 3 TABLETS (60 MG TOTAL) BY MOUTH DAILY WITH BREAKFAST. 03/27/24   Federico Norleen ONEIDA MADISON, MD  TYLENOL  500 MG tablet Take 500 mg by mouth every 6 (six) hours as  needed for mild pain (pain score 1-3) or headache.    [provider]    Physical Exam: Vitals:   05/04/24 2053 05/04/24 2100 05/04/24 2200 05/04/24 2321  BP: 101/64 104/63    Pulse: 87 87    Resp: 19 20    Temp:  98.3 F (36.8 C)    TempSrc:  Oral    SpO2: 98% 98%    Weight:   75.1 kg 75.1 kg  Height:    5' 9 (1.753 m)    Physical Exam Vitals reviewed.  Constitutional:      General: He is not in acute distress. HENT:     Head: Normocephalic and atraumatic.  Eyes:     Extraocular Movements: Extraocular movements intact.  Cardiovascular:     Rate and Rhythm: Normal rate. Rhythm irregular.     Heart sounds: Murmur heard.  Pulmonary:     Effort: Pulmonary effort is normal. No respiratory distress.     Breath sounds: Rales present.  Abdominal:     General: There is distension.     Palpations: Abdomen is soft.     Tenderness: There is  no abdominal tenderness. There is no guarding.     Comments: Bowel sounds present  Musculoskeletal:     Cervical back: Normal range of motion.     Right lower leg: Edema present.     Left lower leg: Edema present.     Comments: Mild bilateral pedal edema  Skin:    General: Skin is warm and dry.  Neurological:     General: No focal deficit present.     Mental Status: He is alert and oriented to person, place, and time.     Labs on Admission: I have personally reviewed following labs and imaging studies  CBC: Recent Labs  Lab 05/04/24 1027  WBC 28.1*  NEUTROABS 25.3*  HGB 16.5  HCT 49.8  MCV 90.9  PLT 139*   Basic Metabolic Panel: Recent Labs  Lab 05/04/24 1027  NA 140  K 3.9  CL 99  CO2 25  GLUCOSE 120*  BUN 20  CREATININE 1.34*  CALCIUM  9.9   GFR: Estimated Creatinine Clearance: 46.2 mL/min (A) (by C-G formula based on SCr of 1.34 mg/dL (H)). Liver Function Tests: No results for input(s): AST, ALT, ALKPHOS, BILITOT, PROT, ALBUMIN  in the last 168 hours. No results for input(s): LIPASE,  AMYLASE in the last 168 hours. No results for input(s): AMMONIA in the last 168 hours. Coagulation Profile: Recent Labs  Lab 05/04/24 1027  INR 2.3*   Cardiac Enzymes: No results for input(s): CKTOTAL, CKMB, CKMBINDEX, TROPONINI in the last 168 hours. BNP (last 3 results) Recent Labs    05/04/24 1027  PROBNP 4,570.0*   HbA1C: No results for input(s): HGBA1C in the last 72 hours. CBG: No results for input(s): GLUCAP in the last 168 hours. Lipid Profile: No results for input(s): CHOL, HDL, LDLCALC, TRIG, CHOLHDL, LDLDIRECT in the last 72 hours. Thyroid  Function Tests: No results for input(s): TSH, T4TOTAL, FREET4, T3FREE, THYROIDAB in the last 72 hours. Anemia Panel: No results for input(s): VITAMINB12, FOLATE, FERRITIN, TIBC, IRON, RETICCTPCT in the last 72 hours. Urine analysis:    Component Value Date/Time   COLORURINE YELLOW 05/04/2024 1120   APPEARANCEUR CLEAR 05/04/2024 1120   LABSPEC 1.026 05/04/2024 1120   PHURINE 6.0 05/04/2024 1120   GLUCOSEU >1,000 (A) 05/04/2024 1120   HGBUR NEGATIVE 05/04/2024 1120   BILIRUBINUR NEGATIVE 05/04/2024 1120   KETONESUR NEGATIVE 05/04/2024 1120   PROTEINUR TRACE (A) 05/04/2024 1120   UROBILINOGEN 0.2 12/28/2009 1126   NITRITE NEGATIVE 05/04/2024 1120   LEUKOCYTESUR NEGATIVE 05/04/2024 1120    Radiological Exams on Admission: DG Abdomen 1 View Result Date: 05/04/2024 EXAM: 1 VIEW XRAY OF THE ABDOMEN 05/04/2024 02:18:47 PM COMPARISON: CT abdomen and pelvis 05/04/2024. CLINICAL HISTORY: confirm NG FINDINGS: LINES, TUBES AND DEVICES: Enteric tube in place below the diaphragm with tip in the mid stomach. BOWEL: Dilated small bowel loops in visualized upper abdomen. SOFT TISSUES: No abnormal calcifications. BONES: No acute fracture. LUNGS: Patchy airspace opacity in the right mid lung. IMPRESSION: 1. Enteric tube tip terminates in the mid stomach. 2. Dilated small bowel loops in the upper  abdomen. 3. Patchy right mid lung airspace opacity. Electronically signed by: Greig Pique MD 05/04/2024 03:17 PM EST RP Workstation: [id number]   CT Angio Chest PE W and/or Wo Contrast Result Date: 05/04/2024 CLINICAL DATA:  Pulmonary embolism suspected, high probability. Abdominal pain, nausea, difficulty breathing, cough. Leg and ankle swelling. Low O2 sat. EXAM: CT ANGIOGRAPHY CHEST CT ABDOMEN AND PELVIS WITH CONTRAST TECHNIQUE: Multidetector CT imaging of the chest was performed using  the standard protocol during bolus administration of intravenous contrast. Multiplanar CT image reconstructions and MIPs were obtained to evaluate the vascular anatomy. Multidetector CT imaging of the abdomen and pelvis was performed using the standard protocol during bolus administration of intravenous contrast. RADIATION DOSE REDUCTION: This exam was performed according to the departmental dose-optimization program which includes automated exposure control, adjustment of the mA and/or kV according to patient size and/or use of iterative reconstruction technique. CONTRAST:  OMNIPAQUE  IOHEXOL  350 MG/ML SOLN COMPARISON:  CT chest 11/05/2023 and CT abdomen pelvis 01/02/2024. FINDINGS: CTA CHEST FINDINGS Cardiovascular: Image quality is degraded by respiratory motion. Negative for pulmonary embolus. Atherosclerotic calcification of the aorta and coronary arteries. Aortic valve replacement. Mitral annulus calcification. Enlarged pulmonic trunk and heart. No pericardial effusion. Ascending aorta measures 3.8 cm (7/89). Mediastinum/Nodes: Thoracic inlet lymph nodes are not enlarged by CT size criteria. Mediastinal lymph nodes measure up to 1.7 cm in the low right paratracheal station, minimally larger but present dating back to 04/14/2020. 1.6 cm right hilar lymph node. No left hilar or axillary adenopathy. Air in fluid in the esophagus are indicative of dysmotility. Lungs/Pleura: Image quality is degraded by expiratory phase  imaging, creating added density in the lungs. Patchy rounded consolidation in the right upper lobe. Additional rounded areas of consolidation in the left lower lobe. Nodule in the inferior posterolateral left lower lobe is seen along the left hemidiaphragm and measures 1.3 x 1.6 cm (6/100), unchanged from 04/14/2020 and considered benign. No pleural fluid. Airway is unremarkable. Musculoskeletal: Degenerative changes in the spine. Chronic mild compression of the T4 and T5 superior endplates. Review of the MIP images confirms the above findings. CT ABDOMEN and PELVIS FINDINGS Hepatobiliary: Liver is enlarged, 19.9 cm. Gallstone. No biliary ductal dilatation. Pancreas: Negative. Spleen: Enlarged, 15.2 cm. Adrenals/Urinary Tract: Adrenal glands are unremarkable. Low-attenuation lesions in the kidneys. No specific follow-up necessary. Kidneys are otherwise unremarkable. Ureters are decompressed. Bladder is grossly unremarkable. Stomach/Bowel: Stomach is distended with fluid. Dilated and fluid-filled jejunum with a transition point in the ventral midline low abdomen (2/51). Remainder of the small bowel is acutely decompressed. There is unobstructed small bowel extending into a right inguinal hernia. Appendix and colon are unremarkable. Vascular/Lymphatic: Atherosclerotic calcification of the aorta. No pathologically enlarged lymph nodes. Reproductive: Prostate is slightly enlarged. Other: Bilateral inguinal hernia repair. Large right inguinal hernia contains unobstructed small bowel. Small left inguinal hernia contains fat. Trace interloop fluid. No mesenteric edema. Musculoskeletal: Degenerative changes in the spine. Review of the MIP images confirms the above findings. IMPRESSION: 1. Negative for pulmonary embolus. 2. High-grade small bowel obstruction with transition point in the low ventral midline abdomen. Minimal associated interloop fluid. No mesenteric edema. 3. Right upper and left lower lobe pneumonia.  Associated enlargement of mediastinal lymph nodes, likely reactive in etiology. 4. Bilateral inguinal hernia repair with a large right inguinal hernia containing unobstructed small bowel and a small left inguinal hernia contains fat. 5. Mild hepatosplenomegaly, improved from 01/02/2024. 6. Cholelithiasis. 7. Mildly enlarged prostate. 8. Aortic atherosclerosis (ICD10-I70.0). Coronary artery calcification. 9. Enlarged pulmonic trunk, indicative of pulmonary arterial hypertension. Electronically Signed   By: Newell Eke M.D.   On: 05/04/2024 13:29   CT ABDOMEN PELVIS W CONTRAST Result Date: 05/04/2024 CLINICAL DATA:  Pulmonary embolism suspected, high probability. Abdominal pain, nausea, difficulty breathing, cough. Leg and ankle swelling. Low O2 sat. EXAM: CT ANGIOGRAPHY CHEST CT ABDOMEN AND PELVIS WITH CONTRAST TECHNIQUE: Multidetector CT imaging of the chest was performed using the standard protocol  during bolus administration of intravenous contrast. Multiplanar CT image reconstructions and MIPs were obtained to evaluate the vascular anatomy. Multidetector CT imaging of the abdomen and pelvis was performed using the standard protocol during bolus administration of intravenous contrast. RADIATION DOSE REDUCTION: This exam was performed according to the departmental dose-optimization program which includes automated exposure control, adjustment of the mA and/or kV according to patient size and/or use of iterative reconstruction technique. CONTRAST:  OMNIPAQUE  IOHEXOL  350 MG/ML SOLN COMPARISON:  CT chest 11/05/2023 and CT abdomen pelvis 01/02/2024. FINDINGS: CTA CHEST FINDINGS Cardiovascular: Image quality is degraded by respiratory motion. Negative for pulmonary embolus. Atherosclerotic calcification of the aorta and coronary arteries. Aortic valve replacement. Mitral annulus calcification. Enlarged pulmonic trunk and heart. No pericardial effusion. Ascending aorta measures 3.8 cm (7/89).  Mediastinum/Nodes: Thoracic inlet lymph nodes are not enlarged by CT size criteria. Mediastinal lymph nodes measure up to 1.7 cm in the low right paratracheal station, minimally larger but present dating back to 04/14/2020. 1.6 cm right hilar lymph node. No left hilar or axillary adenopathy. Air in fluid in the esophagus are indicative of dysmotility. Lungs/Pleura: Image quality is degraded by expiratory phase imaging, creating added density in the lungs. Patchy rounded consolidation in the right upper lobe. Additional rounded areas of consolidation in the left lower lobe. Nodule in the inferior posterolateral left lower lobe is seen along the left hemidiaphragm and measures 1.3 x 1.6 cm (6/100), unchanged from 04/14/2020 and considered benign. No pleural fluid. Airway is unremarkable. Musculoskeletal: Degenerative changes in the spine. Chronic mild compression of the T4 and T5 superior endplates. Review of the MIP images confirms the above findings. CT ABDOMEN and PELVIS FINDINGS Hepatobiliary: Liver is enlarged, 19.9 cm. Gallstone. No biliary ductal dilatation. Pancreas: Negative. Spleen: Enlarged, 15.2 cm. Adrenals/Urinary Tract: Adrenal glands are unremarkable. Low-attenuation lesions in the kidneys. No specific follow-up necessary. Kidneys are otherwise unremarkable. Ureters are decompressed. Bladder is grossly unremarkable. Stomach/Bowel: Stomach is distended with fluid. Dilated and fluid-filled jejunum with a transition point in the ventral midline low abdomen (2/51). Remainder of the small bowel is acutely decompressed. There is unobstructed small bowel extending into a right inguinal hernia. Appendix and colon are unremarkable. Vascular/Lymphatic: Atherosclerotic calcification of the aorta. No pathologically enlarged lymph nodes. Reproductive: Prostate is slightly enlarged. Other: Bilateral inguinal hernia repair. Large right inguinal hernia contains unobstructed small bowel. Small left inguinal hernia  contains fat. Trace interloop fluid. No mesenteric edema. Musculoskeletal: Degenerative changes in the spine. Review of the MIP images confirms the above findings. IMPRESSION: 1. Negative for pulmonary embolus. 2. High-grade small bowel obstruction with transition point in the low ventral midline abdomen. Minimal associated interloop fluid. No mesenteric edema. 3. Right upper and left lower lobe pneumonia. Associated enlargement of mediastinal lymph nodes, likely reactive in etiology. 4. Bilateral inguinal hernia repair with a large right inguinal hernia containing unobstructed small bowel and a small left inguinal hernia contains fat. 5. Mild hepatosplenomegaly, improved from 01/02/2024. 6. Cholelithiasis. 7. Mildly enlarged prostate. 8. Aortic atherosclerosis (ICD10-I70.0). Coronary artery calcification. 9. Enlarged pulmonic trunk, indicative of pulmonary arterial hypertension. Electronically Signed   By: Newell Eke M.D.   On: 05/04/2024 13:29   US  Venous Img Lower Bilateral Result Date: 05/04/2024 EXAM: ULTRASOUND DUPLEX OF THE BILATERAL LOWER EXTREMITY VEINS TECHNIQUE: Duplex ultrasound using B-mode/gray scaled imaging and Doppler spectral analysis and color flow was obtained of the deep venous structures of the bilateral lower extremity. COMPARISON: None available. CLINICAL HISTORY: leg swelling FINDINGS: LEFT: The common femoral  vein, femoral vein, popliteal vein, and posterior tibial vein demonstrate normal compressibility with normal color flow and spectral analysis. RIGHT: The common femoral vein, femoral vein, popliteal vein, and posterior tibial vein demonstrate normal compressibility with normal color flow and spectral analysis. IMPRESSION: 1. No evidence of deep venous thrombosis. Electronically signed by: Evalene Coho MD 05/04/2024 12:21 PM EST RP Workstation: [id number]   DG Chest Port 1 View Result Date: 05/04/2024 CLINICAL DATA:  Abdominal pain and nausea with difficulty breathing and  mild cough for few days. EXAM: PORTABLE CHEST 1 VIEW COMPARISON:  None Available. FINDINGS: Multiple sternal wires are present. The cardiac silhouette is mildly enlarged and unchanged in size. An artificial cardiac valve is seen. Mild to moderate severity infiltrate is seen within the periphery of the mid right lung. Mild left basilar atelectasis and/or infiltrate is also present. No pleural effusion or pneumothorax is identified. Multilevel degenerative changes seen throughout the thoracic spine. IMPRESSION: 1. Mild to moderate severity right mid lung infiltrate. 2. Mild left basilar atelectasis and/or infiltrate. Electronically Signed   By: Suzen Dials M.D.   On: 05/04/2024 11:32    Assessment and Plan  Sepsis secondary to multifocal pneumonia  Presenting with fever, tachycardia, tachypnea, and leukocytosis.  Lactate borderline elevated and improved on repeat labs.  Not hypotensive.  CT showing right upper and left lower lobe pneumonia.  COVID/influenza/RSV PCR negative.  Continue antibiotic coverage with ceftriaxone  and doxycycline .  Avoid macrolides due to QT prolongation.  Strep pneumo/Legionella urinary antigens.  Follow-up blood cultures and trend WBC count.  Acute on chronic HFmrEF Moderate to severe mitral regurgitation/stenosis Last echo done in July 2025 showing EF 40 to 45%, global hypokinesis mild LVH, RV systolic function mildly reduced, moderately elevated pulmonary artery systolic pressure, severe biatrial dilation, moderate to severe mitral regurgitation, and moderate to severe mitral stenosis.  proBNP currently elevated at 4570.  Patient takes Lasix  20 mg daily at home.  He was given IV Lasix  40 mg in the ED.  Monitor intake and output, daily weights, renal function, and electrolytes.  I have sent a message to cardiology requesting consultation in the morning.  Acute hypoxemic respiratory failure In the setting of pneumonia and decompensated heart failure.  Oxygen saturation was  in the mid 70s on room air initially, now stable on 8 L HFNC.  Continue supplemental oxygen, wean as tolerated.  Atrial flutter with RVR In the setting of problems listed above.  Heart rate has now improved.  Continue amiodarone  drip.  He is on warfarin at home but currently n.p.o. due to high-grade SBO.  Start heparin  drip.  Elevated troponin Likely secondary to demand ischemia, continue to trend.  Patient denies chest pain and resting comfortably.  High-grade SBO General Surgery consulted and NG tube placed.  Keep n.p.o., continue pain management, and monitor electrolytes.  QT prolongation QTc 521 on EKG.  Monitor potassium and magnesium  levels.  Avoid QT prolonging drugs.  History of mechanical aortic valve replacement On warfarin at home and INR slightly subtherapeutic at 2.3.  Heparin  drip as above.  Seizure disorder Continue Keppra  in IV form.  Chronic thrombocytopenia Patient is seen by hematology.  Etiology of his chronic thrombocytopenia was felt to be unclear and he is on a prolonged prednisone  taper.  Platelet count stable.  Since he is n.p.o., discussed with pharmacist and will be placed on equivalent dose of IV steroids.  DVT prophylaxis: IV heparin  gtt Code Status: Full Code (discussed with the patient) Family Communication: No family available at  this time. Level of care: Progressive Care Unit Admission status: It is my clinical opinion that admission to INPATIENT is reasonable and necessary because of the expectation that this patient will require hospital care that crosses at least 2 midnights to treat this condition based on the medical complexity of the problems presented.  Given the aforementioned information, the predictability of an adverse outcome is felt to be significant.  Editha Ram MD [facility]  If 7PM-7AM, please contact night-coverage [email or web address]  05/05/2024, 1:05 AM       [1]  Allergies Allergen Reactions   Amoxicillin-Pot  Clavulanate Rash   Codeine Other (See Comments)    Stomach upset   "

## 2024-05-05 NOTE — Progress Notes (Signed)
 Patient's NG tube was fully removed.  RN inserted new NG tube in right nare to the marking of 65, KUB ordered, paged provider to verified that new NG tube could be used.   05/05/24 2145  Provider Notification  Provider Name/Title Franky  Date Provider Notified 05/05/24  Time Provider Notified 2110  Method of Notification Page  Notification Reason Other (Comment) (Verification if NG tube could be used)  Test performed and critical result t  Provider response Other (Comment) (Provider verified that NG tube could be used)  Date of Provider Response 05/05/24  Time of Provider Response 2120

## 2024-05-05 NOTE — Progress Notes (Signed)
 Patient admitted earlier this morning.  H&P reviewed.  Patient seen and examined.  Patient appears to be comfortable on the bed this morning.  He denies any abdominal pain.  He says that he is passing gas from below.  Has not had a bowel movement yet.  Denies any shortness of breath this morning.  Has had a cough for few days.  Vital signs reviewed.  Noted to be stable.  He was on heated high flow at 10 L/min now on nasal cannula at 2 L/min.  Saturations are in the mid 90s.  Lungs reveal a few crackles on the right.  No wheezing or rhonchi.  Normal effort. S1-S2 is irregularly irregular. Abdomen is soft.  Bowel sounds sluggish but present.  Nontender.  No masses organomegaly.  No obvious focal neurological deficits.  Labs are reviewed from this morning.  Mostly stable.  He is noted to have leukocytosis.  He has thrombocytopenia which is chronic.  Creatinine is noted to be 1.49.  BUN is noted to be 28.  Hospitalized for multiple issues including small bowel obstruction, multifocal pneumonia.  He does have a history of congestive heart failure as well.  He has a history of atrial flutter and was noted to have RVR.  Cannot get oral medicines due to SBO.  Currently on amiodarone  infusion.  Cardiology is supposed to see the patient.  He is on warfarin for anticoagulation.  Currently on a heparin  infusion.  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  Since he is n.p.o. due to SBO we will place him on gentle IV hydration.  There was some concern for volume overload yesterday but no pulmonary edema noted on imaging studies.  Other issues as per H&P.  Continue to monitor.  Alan Willis 05/05/2024

## 2024-05-06 ENCOUNTER — Inpatient Hospital Stay

## 2024-05-06 DIAGNOSIS — J188 Other pneumonia, unspecified organism: Secondary | ICD-10-CM | POA: Diagnosis not present

## 2024-05-06 LAB — BASIC METABOLIC PANEL WITH GFR
Anion gap: 17 — ABNORMAL HIGH (ref 5–15)
BUN: 34 mg/dL — ABNORMAL HIGH (ref 8–23)
CO2: 23 mmol/L (ref 22–32)
Calcium: 9.2 mg/dL (ref 8.9–10.3)
Chloride: 99 mmol/L (ref 98–111)
Creatinine, Ser: 1.63 mg/dL — ABNORMAL HIGH (ref 0.61–1.24)
GFR, Estimated: 43 mL/min — ABNORMAL LOW
Glucose, Bld: 131 mg/dL — ABNORMAL HIGH (ref 70–99)
Potassium: 4.2 mmol/L (ref 3.5–5.1)
Sodium: 139 mmol/L (ref 135–145)

## 2024-05-06 LAB — CBC
HCT: 45.2 % (ref 39.0–52.0)
Hemoglobin: 14.3 g/dL (ref 13.0–17.0)
MCH: 30.2 pg (ref 26.0–34.0)
MCHC: 31.6 g/dL (ref 30.0–36.0)
MCV: 95.6 fL (ref 80.0–100.0)
Platelets: 104 K/uL — ABNORMAL LOW (ref 150–400)
RBC: 4.73 MIL/uL (ref 4.22–5.81)
RDW: 18.1 % — ABNORMAL HIGH (ref 11.5–15.5)
WBC: 14.2 K/uL — ABNORMAL HIGH (ref 4.0–10.5)
nRBC: 0 % (ref 0.0–0.2)

## 2024-05-06 LAB — PROTIME-INR
INR: 2.5 — ABNORMAL HIGH (ref 0.8–1.2)
Prothrombin Time: 28.3 s — ABNORMAL HIGH (ref 11.4–15.2)

## 2024-05-06 MED ORDER — SODIUM CHLORIDE 0.9 % IV SOLN: INTRAVENOUS | Status: DC

## 2024-05-06 MED ORDER — AMIODARONE HCL 200 MG PO TABS: 200.0000 mg | ORAL_TABLET | Freq: Two times a day (BID) | ORAL | Status: DC

## 2024-05-06 MED ORDER — WARFARIN SODIUM 1 MG PO TABS
1.0000 mg | ORAL_TABLET | Freq: Every day | ORAL | Status: DC
  Administered 2024-05-06: 1 mg via ORAL
  Filled 2024-05-06: qty 1

## 2024-05-06 MED ORDER — AMIODARONE HCL 200 MG PO TABS: 200.0000 mg | ORAL_TABLET | Freq: Every day | ORAL | Status: DC

## 2024-05-06 MED ORDER — PREDNISONE 20 MG PO TABS
20.0000 mg | ORAL_TABLET | Freq: Every day | ORAL | Status: DC
  Administered 2024-05-07 – 2024-05-08 (×2): 20 mg via ORAL
  Filled 2024-05-06 (×2): qty 1

## 2024-05-06 MED ORDER — LEVETIRACETAM 750 MG PO TABS
750.0000 mg | ORAL_TABLET | Freq: Every day | ORAL | Status: DC
  Administered 2024-05-06 – 2024-05-07 (×2): 750 mg via ORAL
  Filled 2024-05-06 (×3): qty 1

## 2024-05-06 MED ORDER — WARFARIN - PHARMACIST DOSING INPATIENT: Freq: Every day | Status: DC

## 2024-05-06 MED ORDER — LEVETIRACETAM 500 MG PO TABS
500.0000 mg | ORAL_TABLET | Freq: Every day | ORAL | Status: DC
  Administered 2024-05-07 – 2024-05-08 (×2): 500 mg via ORAL
  Filled 2024-05-06 (×2): qty 1

## 2024-05-06 MED ORDER — DOXYCYCLINE HYCLATE 100 MG PO TABS
100.0000 mg | ORAL_TABLET | Freq: Two times a day (BID) | ORAL | Status: DC
  Administered 2024-05-06 – 2024-05-08 (×4): 100 mg via ORAL
  Filled 2024-05-06 (×4): qty 1

## 2024-05-06 MED ORDER — AMIODARONE HCL 200 MG PO TABS
400.0000 mg | ORAL_TABLET | Freq: Two times a day (BID) | ORAL | Status: DC
  Administered 2024-05-06 – 2024-05-08 (×4): 400 mg via ORAL
  Filled 2024-05-06 (×4): qty 2

## 2024-05-06 MED ORDER — LACTATED RINGERS IV BOLUS
250.0000 mL | Freq: Once | INTRAVENOUS | Status: AC
  Administered 2024-05-06: 250 mL via INTRAVENOUS

## 2024-05-06 MED ORDER — AMIODARONE HCL 200 MG PO TABS: 400.0000 mg | ORAL_TABLET | Freq: Two times a day (BID) | ORAL | Status: DC

## 2024-05-06 NOTE — Progress Notes (Signed)
 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  On presentation he was in a flutter with rate in the range of 140s, stable blood pressure, fever around 1.2.  WBC count 28,000, creatinine of 1.3, proBNP of 4570, lactic acid was 2.  CTA chest negative for PE but showed right upper and left lower lobe pneumonia.  CT abdomen/pelvis showed high-grade small bowel obstruction with transition point in the lower ventral midline abdomen.  General surgery, cardiology consulted.  Started on antibiotics for pneumonia.  Started on IV amiodarone   Assessment & Plan:  Principal Problem:   Multifocal pneumonia Active Problems:   Atrial flutter with rapid ventricular response (HCC)   Atrial fibrillation with rapid ventricular response (HCC)   Acute exacerbation of CHF (congestive heart failure) (HCC)   Acute respiratory failure with hypoxia (HCC)   Sepsis (HCC)  Sepsis secondary to multifocal pneumonia/acute hypoxic respiratory failure: Presented with fever, tachycardia, tachypnea, leukocytosis.  Hypoxic on presentation and was put on high flow  oxygen.  Currently on 3 L of oxygen.  Lactic acid was mildly elevated.  Not hypotensive.  CT chest showed right upper/left lower lobe pneumonia.  COVID/flu/RSV negative.  Continue current antibiotics, follow-up cultures.  This could also be from aspiration from small bowel obstruction.  Will continue to wean oxygen.  SBO: Presented with abdominal pain.CT abdomen/pelvis showed high-grade small bowel obstruction with transition point in the lower ventral midline abdomen.  General surgery  following.  Continue small bowel obstruction protocol.  Currently on full liquid diet.  Abdomen is soft, nontender, nondistended today.  Passing gas.  No bowel movement yet.  NG tube removed  A-fib with RVR: Takes warfarin at home.  Started on amiodarone  drip.  Warfarin on hold due to high-grade SBO.  Plan to start on heparin  drip when INR falls below 2.  INR 2.5 today.  Acute on chronic HFmrEF: Last echo in July 2025 showed EF of 40 to 45%, global hypokinesis, right ventricular systolic function mildly reduced, moderate elevated pulmonary artery systolic pressure, severe biatrial dilatation.  Elevated BNP of 4570.  Takes Lasix  20 mg daily at home.  Given IV Lasix  in the emergency department.  Monitoring input/output, daily weight.  Cardiology following. Had mild elevated troponin, from demand ischemia.  Appears euvolemic today.  AKI on CKD stage IIIa: baseline creatinine around 1.3.  Creatinine slightly trending up to the range of 1.6.  Continue to monitor  History of moderate to severe mitral regurgitation/stenosis, mechanical aortic valve replacement: Follows up with cardiology.  Takes warfarin.  Goal INR 2.5-3  Seizure disorder: Continue Keppra  as  IV  Chronic thrombocytopenia: Follows with hematology.  On prolonged prednisone  taper.  Currently platelets, stable.  On IV steroid for replacement  Prolonged QTc: Continue to monitor on telemetry.  Leukocytosis: This is most likely secondary to steroids.  Continue to monitor  Deconditioning: Will consult PT            DVT prophylaxis:None, INR is therapeutic     Code Status: Full Code  Family Communication: Wife at bedside on 1/21  Patient status: Inpatient  Patient is from : Home  Anticipated discharge to: Home  Estimated DC date: 1 to 2  days   Consultants: Cardiology, general surgery  Procedures: None yet  Antimicrobials:  Anti-infectives (From admission, onward)    Start     Dose/Rate Route Frequency Ordered Stop    05/05/24 1200  cefTRIAXone  (ROCEPHIN ) 2 g in sodium chloride  0.9 % 100 mL IVPB        2 g 200 mL/hr over 30 Minutes Intravenous Every 24 hours 05/05/24 0223     05/05/24 0600  doxycycline  (VIBRAMYCIN ) 100 mg in sodium chloride  0.9 % 250 mL IVPB        100 mg 125 mL/hr over 120 Minutes Intravenous Every 12 hours 05/05/24 0223     05/04/24 1230  cefTRIAXone  (ROCEPHIN ) 2 g in sodium chloride  0.9 % 100 mL IVPB        2 g 200 mL/hr over 30 Minutes Intravenous  Once 05/04/24 1220 05/04/24 1335   05/04/24 1230  azithromycin  (ZITHROMAX ) tablet 500 mg  Status:  Discontinued        500 mg Oral Daily 05/04/24 1220 05/04/24 1947       Subjective: Patient seen and examined at the bedside today.  Appears very comfortable.  Lying in bed.  Denied any shortness of breath or chest pain.  On 3 L of oxygen per minute.  No cough.  NG tube removed today.  Abdomen is soft, nondistended, nontender.  Passing gas, no bowel movement yet.  Objective: Vitals:   05/06/24 0144 05/06/24 0344 05/06/24 0745 05/06/24 0842  BP: (!) 112/95 107/88 (!) 114/91   Pulse: (!) 124 (!) 124 (!) 119 (!) 117  Resp: 18 17 17 16   Temp: 98 F (36.7 C) 98 F (36.7 C) 97.9 F (36.6 C)   TempSrc: Oral Oral Oral   SpO2: 91% 96% (!) 85% 100%  Weight:  74.8 kg    Height:        Intake/Output Summary (Last 24 hours) at 05/06/2024 0908 Last data filed at 05/06/2024 9095 Gross per 24 hour  Intake 327.2 ml  Output 1450 ml  Net -1122.8 ml   Filed Weights   05/04/24 2200 05/04/24 2321 05/06/24 0344  Weight: 75.1 kg 75.1 kg 74.8 kg    Examination:  General exam: Overall comfortable, not in distress HEENT: PERRL Respiratory system:  diminished sounds bilaterally, few crackles in bilateral bases Cardiovascular system: Sinus tach Gastrointestinal system: Abdomen is nondistended, soft and nontender.  Good bowel sounds Central nervous system: Alert and oriented Extremities: No edema, no clubbing ,no cyanosis Skin: No rashes, no  ulcers,no icterus     Data Reviewed: I have personally reviewed following labs and imaging studies  CBC: Recent Labs  Lab 05/04/24 1027 05/05/24 0420 05/06/24 0451  WBC 28.1* 14.9* 14.2*  NEUTROABS 25.3*  --   --   HGB 16.5 13.7 14.3  HCT 49.8 41.2 45.2  MCV 90.9 92.0 95.6  PLT 139* 82* 104*   Basic Metabolic Panel: Recent Labs  Lab 05/04/24 1027 05/05/24 0420 05/06/24 0451  NA 140 140 139  K 3.9 4.1 4.2  CL 99 101 99  CO2 25 26 23   GLUCOSE 120* 91 131*  BUN 20 28* 34*  CREATININE 1.34* 1.49* 1.63*  CALCIUM  9.9 8.5* 9.2  MG  --  2.2  --      Recent Results (from the past 240 hours)  Resp panel by RT-PCR (RSV, Flu A&B, Covid) Anterior Nasal Swab     Status: None   Collection Time: 05/04/24 10:45 AM   Specimen: Anterior Nasal Swab  Result Value  Ref Range Status   SARS Coronavirus 2 by RT PCR NEGATIVE NEGATIVE Final    Comment: (NOTE) SARS-CoV-2 target nucleic acids are NOT DETECTED.  The SARS-CoV-2 RNA is generally detectable in upper respiratory specimens during the acute phase of infection. The lowest concentration of SARS-CoV-2 viral copies this assay can detect is 138 copies/mL. A negative result does not preclude SARS-Cov-2 infection and should not be used as the sole basis for treatment or other patient management decisions. A negative result may occur with  improper specimen collection/handling, submission of specimen other than nasopharyngeal swab, presence of viral mutation(s) within the areas targeted by this assay, and inadequate number of viral copies(<138 copies/mL). A negative result must be combined with clinical observations, patient history, and epidemiological information. The expected result is Negative.  Fact Sheet for Patients:  bloggercourse.com  Fact Sheet for Healthcare Providers:  seriousbroker.it  This test is no t yet approved or cleared by the [place]  FDA and  has been  authorized for detection and/or diagnosis of SARS-CoV-2 by FDA under an Emergency Use Authorization (EUA). This EUA will remain  in effect (meaning this test can be used) for the duration of the COVID-19 declaration under Section 564(b)(1) of the Act, 21 U.S.C.section 360bbb-3(b)(1), unless the authorization is terminated  or revoked sooner.       Influenza A by PCR NEGATIVE NEGATIVE Final   Influenza B by PCR NEGATIVE NEGATIVE Final    Comment: (NOTE) The Xpert Xpress SARS-CoV-2/FLU/RSV plus assay is intended as an aid in the diagnosis of influenza from Nasopharyngeal swab specimens and should not be used as a sole basis for treatment. Nasal washings and aspirates are unacceptable for Xpert Xpress SARS-CoV-2/FLU/RSV testing.  Fact Sheet for Patients: bloggercourse.com  Fact Sheet for Healthcare Providers: seriousbroker.it  This test is not yet approved or cleared by the [place]  FDA and has been authorized for detection and/or diagnosis of SARS-CoV-2 by FDA under an Emergency Use Authorization (EUA). This EUA will remain in effect (meaning this test can be used) for the duration of the COVID-19 declaration under Section 564(b)(1) of the Act, 21 U.S.C. section 360bbb-3(b)(1), unless the authorization is terminated or revoked.     Resp Syncytial Virus by PCR NEGATIVE NEGATIVE Final    Comment: (NOTE) Fact Sheet for Patients: bloggercourse.com  Fact Sheet for Healthcare Providers: seriousbroker.it  This test is not yet approved or cleared by the [place]  FDA and has been authorized for detection and/or diagnosis of SARS-CoV-2 by FDA under an Emergency Use Authorization (EUA). This EUA will remain in effect (meaning this test can be used) for the duration of the COVID-19 declaration under Section 564(b)(1) of the Act, 21 U.S.C. section 360bbb-3(b)(1), unless the  authorization is terminated or revoked.  Performed at [facility], [address], [place], [place] [address]   Blood culture (routine x 2)     Status: None (Preliminary result)   Collection Time: 05/04/24 12:41 PM   Specimen: BLOOD RIGHT FOREARM  Result Value Ref Range Status   Specimen Description   Final    BLOOD RIGHT FOREARM Performed at [facility], [address] [place], [place] [address]    Special Requests   Final    Blood Culture adequate volume BOTTLES DRAWN AEROBIC AND ANAEROBIC Performed at [facility], [address], [place], [place] [address]    Culture   Final    NO GROWTH 2 DAYS Performed at [facility], [address],  [place], [place] [address]    Report Status PENDING  Incomplete  Blood culture (routine x 2)     Status: None (Preliminary result)   Collection Time: 05/04/24 12:46 PM   Specimen: BLOOD  Result Value Ref Range Status   Specimen Description   Final    BLOOD LEFT ANTECUBITAL Performed at [facility], [address], [place], [place] [address]    Special Requests   Final    Blood Culture adequate volume BOTTLES DRAWN AEROBIC AND ANAEROBIC Performed at [facility], [address], [place], [place] [address]    Culture   Final    NO GROWTH 2 DAYS Performed at [facility], [address] [place], [place] [address]    Report Status PENDING  Incomplete     Radiology Studies: DG Abd 1 View Result Date: 05/05/2024 CLINICAL DATA:  NG tube placement EXAM: ABDOMEN - 1 VIEW COMPARISON:  05/05/2024, 05/04/2024 . FINDINGS: sternotomy and valve prosthesis. Streaky densities at the left base. Enteric tube tip and side-port overlie the stomach. Enteral contrast within the colon IMPRESSION: Enteric tube tip and side-port overlie the stomach. Electronically Signed   By: Luke Bun M.D.   On: 05/05/2024 21:02   DG Abd Portable 1V-Small Bowel  Obstruction Protocol-initial, 8 hr delay Result Date: 05/05/2024 EXAM: 1 VIEW XRAY OF THE ABDOMEN 05/05/2024 07:00:00 PM COMPARISON: 05/04/2024 CLINICAL HISTORY: Small bowel obstruction FINDINGS: BOWEL: Nonobstructive bowel gas pattern with contrast extending from the cecum to the rectum. SOFT TISSUES: Hernia mesh overlying the lower pelvis bilaterally. BONES: No acute fracture. IMPRESSION: 1. Nonobstructive bowel gas pattern. Contrast extends from the cecum to the rectum. Electronically signed by: Pinkie Pebbles MD 05/05/2024 07:41 PM EST RP Workstation: [id number]   DG Abdomen 1 View Result Date: 05/04/2024 EXAM: 1 VIEW XRAY OF THE ABDOMEN 05/04/2024 02:18:47 PM COMPARISON: CT abdomen and pelvis 05/04/2024. CLINICAL HISTORY: confirm NG FINDINGS: LINES, TUBES AND DEVICES: Enteric tube in place below the diaphragm with tip in the mid stomach. BOWEL: Dilated small bowel loops in visualized upper abdomen. SOFT TISSUES: No abnormal calcifications. BONES: No acute fracture. LUNGS: Patchy airspace opacity in the right mid lung. IMPRESSION: 1. Enteric tube tip terminates in the mid stomach. 2. Dilated small bowel loops in the upper abdomen. 3. Patchy right mid lung airspace opacity. Electronically signed by: Greig Pique MD 05/04/2024 03:17 PM EST RP Workstation: [id number]   CT Angio Chest PE W and/or Wo Contrast Result Date: 05/04/2024 CLINICAL DATA:  Pulmonary embolism suspected, high probability. Abdominal pain, nausea, difficulty breathing, cough. Leg and ankle swelling. Low O2 sat. EXAM: CT ANGIOGRAPHY CHEST CT ABDOMEN AND PELVIS WITH CONTRAST TECHNIQUE: Multidetector CT imaging of the chest was performed using the standard protocol during bolus administration of intravenous contrast. Multiplanar CT image reconstructions and MIPs were obtained to evaluate the vascular anatomy. Multidetector CT imaging of the abdomen and pelvis was performed using the standard protocol during bolus administration of  intravenous contrast. RADIATION DOSE REDUCTION: This exam was performed according to the departmental dose-optimization program which includes automated exposure control, adjustment of the mA and/or kV according to patient size and/or use of iterative reconstruction technique. CONTRAST:  OMNIPAQUE  IOHEXOL  350 MG/ML SOLN COMPARISON:  CT chest 11/05/2023 and CT abdomen pelvis 01/02/2024. FINDINGS: CTA CHEST FINDINGS Cardiovascular: Image quality is degraded by respiratory motion. Negative for pulmonary embolus. Atherosclerotic calcification of the aorta and coronary arteries. Aortic valve replacement. Mitral annulus calcification. Enlarged pulmonic trunk and heart. No pericardial effusion. Ascending aorta  measures 3.8 cm (7/89). Mediastinum/Nodes: Thoracic inlet lymph nodes are not enlarged by CT size criteria. Mediastinal lymph nodes measure up to 1.7 cm in the low right paratracheal station, minimally larger but present dating back to 04/14/2020. 1.6 cm right hilar lymph node. No left hilar or axillary adenopathy. Air in fluid in the esophagus are indicative of dysmotility. Lungs/Pleura: Image quality is degraded by expiratory phase imaging, creating added density in the lungs. Patchy rounded consolidation in the right upper lobe. Additional rounded areas of consolidation in the left lower lobe. Nodule in the inferior posterolateral left lower lobe is seen along the left hemidiaphragm and measures 1.3 x 1.6 cm (6/100), unchanged from 04/14/2020 and considered benign. No pleural fluid. Airway is unremarkable. Musculoskeletal: Degenerative changes in the spine. Chronic mild compression of the T4 and T5 superior endplates. Review of the MIP images confirms the above findings. CT ABDOMEN and PELVIS FINDINGS Hepatobiliary: Liver is enlarged, 19.9 cm. Gallstone. No biliary ductal dilatation. Pancreas: Negative. Spleen: Enlarged, 15.2 cm. Adrenals/Urinary Tract: Adrenal glands are unremarkable. Low-attenuation  lesions in the kidneys. No specific follow-up necessary. Kidneys are otherwise unremarkable. Ureters are decompressed. Bladder is grossly unremarkable. Stomach/Bowel: Stomach is distended with fluid. Dilated and fluid-filled jejunum with a transition point in the ventral midline low abdomen (2/51). Remainder of the small bowel is acutely decompressed. There is unobstructed small bowel extending into a right inguinal hernia. Appendix and colon are unremarkable. Vascular/Lymphatic: Atherosclerotic calcification of the aorta. No pathologically enlarged lymph nodes. Reproductive: Prostate is slightly enlarged. Other: Bilateral inguinal hernia repair. Large right inguinal hernia contains unobstructed small bowel. Small left inguinal hernia contains fat. Trace interloop fluid. No mesenteric edema. Musculoskeletal: Degenerative changes in the spine. Review of the MIP images confirms the above findings. IMPRESSION: 1. Negative for pulmonary embolus. 2. High-grade small bowel obstruction with transition point in the low ventral midline abdomen. Minimal associated interloop fluid. No mesenteric edema. 3. Right upper and left lower lobe pneumonia. Associated enlargement of mediastinal lymph nodes, likely reactive in etiology. 4. Bilateral inguinal hernia repair with a large right inguinal hernia containing unobstructed small bowel and a small left inguinal hernia contains fat. 5. Mild hepatosplenomegaly, improved from 01/02/2024. 6. Cholelithiasis. 7. Mildly enlarged prostate. 8. Aortic atherosclerosis (ICD10-I70.0). Coronary artery calcification. 9. Enlarged pulmonic trunk, indicative of pulmonary arterial hypertension. Electronically Signed   By: Newell Eke M.D.   On: 05/04/2024 13:29   CT ABDOMEN PELVIS W CONTRAST Result Date: 05/04/2024 CLINICAL DATA:  Pulmonary embolism suspected, high probability. Abdominal pain, nausea, difficulty breathing, cough. Leg and ankle swelling. Low O2 sat. EXAM: CT ANGIOGRAPHY CHEST  CT ABDOMEN AND PELVIS WITH CONTRAST TECHNIQUE: Multidetector CT imaging of the chest was performed using the standard protocol during bolus administration of intravenous contrast. Multiplanar CT image reconstructions and MIPs were obtained to evaluate the vascular anatomy. Multidetector CT imaging of the abdomen and pelvis was performed using the standard protocol during bolus administration of intravenous contrast. RADIATION DOSE REDUCTION: This exam was performed according to the departmental dose-optimization program which includes automated exposure control, adjustment of the mA and/or kV according to patient size and/or use of iterative reconstruction technique. CONTRAST:  OMNIPAQUE  IOHEXOL  350 MG/ML SOLN COMPARISON:  CT chest 11/05/2023 and CT abdomen pelvis 01/02/2024. FINDINGS: CTA CHEST FINDINGS Cardiovascular: Image quality is degraded by respiratory motion. Negative for pulmonary embolus. Atherosclerotic calcification of the aorta and coronary arteries. Aortic valve replacement. Mitral annulus calcification. Enlarged pulmonic trunk and heart. No pericardial effusion. Ascending aorta measures 3.8 cm (  7/89). Mediastinum/Nodes: Thoracic inlet lymph nodes are not enlarged by CT size criteria. Mediastinal lymph nodes measure up to 1.7 cm in the low right paratracheal station, minimally larger but present dating back to 04/14/2020. 1.6 cm right hilar lymph node. No left hilar or axillary adenopathy. Air in fluid in the esophagus are indicative of dysmotility. Lungs/Pleura: Image quality is degraded by expiratory phase imaging, creating added density in the lungs. Patchy rounded consolidation in the right upper lobe. Additional rounded areas of consolidation in the left lower lobe. Nodule in the inferior posterolateral left lower lobe is seen along the left hemidiaphragm and measures 1.3 x 1.6 cm (6/100), unchanged from 04/14/2020 and considered benign. No pleural fluid. Airway is unremarkable.  Musculoskeletal: Degenerative changes in the spine. Chronic mild compression of the T4 and T5 superior endplates. Review of the MIP images confirms the above findings. CT ABDOMEN and PELVIS FINDINGS Hepatobiliary: Liver is enlarged, 19.9 cm. Gallstone. No biliary ductal dilatation. Pancreas: Negative. Spleen: Enlarged, 15.2 cm. Adrenals/Urinary Tract: Adrenal glands are unremarkable. Low-attenuation lesions in the kidneys. No specific follow-up necessary. Kidneys are otherwise unremarkable. Ureters are decompressed. Bladder is grossly unremarkable. Stomach/Bowel: Stomach is distended with fluid. Dilated and fluid-filled jejunum with a transition point in the ventral midline low abdomen (2/51). Remainder of the small bowel is acutely decompressed. There is unobstructed small bowel extending into a right inguinal hernia. Appendix and colon are unremarkable. Vascular/Lymphatic: Atherosclerotic calcification of the aorta. No pathologically enlarged lymph nodes. Reproductive: Prostate is slightly enlarged. Other: Bilateral inguinal hernia repair. Large right inguinal hernia contains unobstructed small bowel. Small left inguinal hernia contains fat. Trace interloop fluid. No mesenteric edema. Musculoskeletal: Degenerative changes in the spine. Review of the MIP images confirms the above findings. IMPRESSION: 1. Negative for pulmonary embolus. 2. High-grade small bowel obstruction with transition point in the low ventral midline abdomen. Minimal associated interloop fluid. No mesenteric edema. 3. Right upper and left lower lobe pneumonia. Associated enlargement of mediastinal lymph nodes, likely reactive in etiology. 4. Bilateral inguinal hernia repair with a large right inguinal hernia containing unobstructed small bowel and a small left inguinal hernia contains fat. 5. Mild hepatosplenomegaly, improved from 01/02/2024. 6. Cholelithiasis. 7. Mildly enlarged prostate. 8. Aortic atherosclerosis (ICD10-I70.0). Coronary  artery calcification. 9. Enlarged pulmonic trunk, indicative of pulmonary arterial hypertension. Electronically Signed   By: Newell Eke M.D.   On: 05/04/2024 13:29   US  Venous Img Lower Bilateral Result Date: 05/04/2024 EXAM: ULTRASOUND DUPLEX OF THE BILATERAL LOWER EXTREMITY VEINS TECHNIQUE: Duplex ultrasound using B-mode/gray scaled imaging and Doppler spectral analysis and color flow was obtained of the deep venous structures of the bilateral lower extremity. COMPARISON: None available. CLINICAL HISTORY: leg swelling FINDINGS: LEFT: The common femoral vein, femoral vein, popliteal vein, and posterior tibial vein demonstrate normal compressibility with normal color flow and spectral analysis. RIGHT: The common femoral vein, femoral vein, popliteal vein, and posterior tibial vein demonstrate normal compressibility with normal color flow and spectral analysis. IMPRESSION: 1. No evidence of deep venous thrombosis. Electronically signed by: Evalene Coho MD 05/04/2024 12:21 PM EST RP Workstation: [id number]   DG Chest Port 1 View Result Date: 05/04/2024 CLINICAL DATA:  Abdominal pain and nausea with difficulty breathing and mild cough for few days. EXAM: PORTABLE CHEST 1 VIEW COMPARISON:  None Available. FINDINGS: Multiple sternal wires are present. The cardiac silhouette is mildly enlarged and unchanged in size. An artificial cardiac valve is seen. Mild to moderate severity infiltrate is seen within the periphery of the mid right  lung. Mild left basilar atelectasis and/or infiltrate is also present. No pleural effusion or pneumothorax is identified. Multilevel degenerative changes seen throughout the thoracic spine. IMPRESSION: 1. Mild to moderate severity right mid lung infiltrate. 2. Mild left basilar atelectasis and/or infiltrate. Electronically Signed   By: Suzen Dials M.D.   On: 05/04/2024 11:32    Scheduled Meds:  levETIRAcetam   500 mg Intravenous q morning   levETIRAcetam   750 mg  Intravenous QPM   methylPREDNISolone  (SOLU-MEDROL ) injection  16 mg Intravenous Daily   Continuous Infusions:  sodium chloride  40 mL/hr at 05/05/24 1205   amiodarone  30 mg/hr (05/06/24 0259)   cefTRIAXone  (ROCEPHIN )  IV 2 g (05/05/24 1211)   doxycycline  (VIBRAMYCIN ) IV 100 mg (05/06/24 0535)     LOS: 2 days   Ivonne Mustache, MD [facility] P1/21/2026, 9:08 AM  "

## 2024-05-06 NOTE — Progress Notes (Signed)
 PHARMACY - ANTICOAGULATION CONSULT NOTE  Pharmacy Consult for warfarin Indication: mechAVR  Allergies[1]  Patient Measurements: Height: 5' 9 (175.3 cm) Weight: 74.8 kg (164 lb 14.4 oz) IBW/kg (Calculated) : 70.7 HEPARIN  DW (KG): 75.1  Vital Signs: Temp: 97.9 F (36.6 C) (01/21 0745) Temp Source: Oral (01/21 0745) BP: 114/91 (01/21 0745) Pulse Rate: 117 (01/21 1126)  Labs: Recent Labs    05/04/24 1027 05/05/24 0420 05/06/24 0451  HGB 16.5 13.7 14.3  HCT 49.8 41.2 45.2  PLT 139* 82* 104*  LABPROT 26.0* 28.3* 28.3*  INR 2.3* 2.5* 2.5*  CREATININE 1.34* 1.49* 1.63*    Estimated Creatinine Clearance: 38 mL/min (A) (by C-G formula based on SCr of 1.63 mg/dL (H)).   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 Warfarin was held for SBO and INR was 2.3-2.5 despite holding warfarin. Amiodarone  started 1/19 for afib. Pharmacy consulted for warfarin dosing.   Note new drug interaction with amiodarone .  Warfarin PTA 4mg  Tue/Thr, 2mg  all other days  Goal of Therapy:  INR 2.5 -3.5  Monitor platelets by anticoagulation protocol: Yes   Plan:  Warfarin 1mg  daily, adjust as needed Monitor daily INR, signs/symptoms of bleeding    Jinnie Door, PharmD, BCPS, BCCP Clinical Pharmacist  Please check AMION for all [facility] Pharmacy phone numbers After 10:00 PM, call Main Pharmacy [phone]     [1]  Allergies Allergen Reactions   Amoxicillin-Pot  Clavulanate Rash   Codeine Other (See Comments)    Stomach upset

## 2024-05-06 NOTE — Progress Notes (Signed)
 [facility] Progress Note     Subjective: CC:  NG replaced overnight. Pt reports flatus and a BM. Denies abdominal pain  HR 117 and irregular during my evaluation. Cardiology PA at bedside.  Objective: Vital signs in last 24 hours: Temp:  [97.9 F (36.6 C)-98.8 F (37.1 C)] 97.9 F (36.6 C) (01/21 0745) Pulse Rate:  [47-124] 119 (01/21 0745) Resp:  [11-22] 17 (01/21 0745) BP: (91-114)/(71-95) 114/91 (01/21 0745) SpO2:  [85 %-98 %] 85 % (01/21 0745) Weight:  [74.8 kg] 74.8 kg (01/21 0344) Last BM Date : 05/04/24  Intake/Output from previous day: 01/20 0701 - 01/21 0700 In: 327.2 [I.V.:80; NG/GT:150; IV Piggyback:97.2] Out: 1150 [Urine:1150] Intake/Output this shift: No intake/output data recorded.  PE: Gen:  Alert, NAD, pleasant Card:  irregularly irregular Pulm:  Normal effort on [place] Abd: Soft, non-tender, non-distended, previous laparotomy scar well-healing Skin: warm and dry, no rashes  Psych: A&Ox3   Lab Results:  Recent Labs    05/05/24 0420 05/06/24 0451  WBC 14.9* 14.2*  HGB 13.7 14.3  HCT 41.2 45.2  PLT 82* 104*   BMET Recent Labs    05/05/24 0420 05/06/24 0451  NA 140 139  K 4.1 4.2  CL 101 99  CO2 26 23  GLUCOSE 91 131*  BUN 28* 34*  CREATININE 1.49* 1.63*  CALCIUM  8.5* 9.2   PT/INR Recent Labs    05/05/24 0420 05/06/24 0451  LABPROT 28.3* 28.3*  INR 2.5* 2.5*   CMP     Component Value Date/Time   NA 139 05/06/2024 0451   NA 139 05/03/2023 0939   K 4.2 05/06/2024 0451   CL 99 05/06/2024 0451   CO2 23 05/06/2024 0451   GLUCOSE 131 (H) 05/06/2024 0451   BUN 34 (H) 05/06/2024 0451   BUN 12 05/03/2023 0939   CREATININE 1.63 (H) 05/06/2024 0451   CREATININE 1.50 (H) 04/22/2024 1130   CALCIUM  9.2 05/06/2024 0451   PROT 5.9 (L) 05/05/2024 0420   PROT 6.9 05/03/2023 0939   ALBUMIN  3.5 05/05/2024 0420   ALBUMIN  4.3 05/03/2023 0939   AST 22 05/05/2024 0420   AST 45 (H) 04/22/2024 1130   ALT 28 05/05/2024 0420   ALT  90 (H) 04/22/2024 1130   ALKPHOS 75 05/05/2024 0420   BILITOT 2.2 (H) 05/05/2024 0420   BILITOT 2.1 (H) 04/22/2024 1130   GFRNONAA 43 (L) 05/06/2024 0451   GFRNONAA 48 (L) 04/22/2024 1130   GFRAA  12/29/2009 0625    >60        The eGFR has been calculated using the MDRD equation. This calculation has not been validated in all clinical situations. eGFR's persistently <60 mL/min signify possible Chronic Kidney Disease.   Lipase     Component Value Date/Time   LIPASE <10 (L) 03/03/2023 1048       Studies/Results: DG Abd 1 View Result Date: 05/05/2024 CLINICAL DATA:  NG tube placement EXAM: ABDOMEN - 1 VIEW COMPARISON:  05/05/2024, 05/04/2024 . FINDINGS: sternotomy and valve prosthesis. Streaky densities at the left base. Enteric tube tip and side-port overlie the stomach. Enteral contrast within the colon IMPRESSION: Enteric tube tip and side-port overlie the stomach. Electronically Signed   By: Luke Bun M.D.   On: 05/05/2024 21:02   DG Abd Portable 1V-Small Bowel Obstruction Protocol-initial, 8 hr delay Result Date: 05/05/2024 EXAM: 1 VIEW XRAY OF THE ABDOMEN 05/05/2024 07:00:00 PM COMPARISON: 05/04/2024 CLINICAL HISTORY: Small bowel obstruction FINDINGS: BOWEL: Nonobstructive bowel gas pattern with contrast extending from  the cecum to the rectum. SOFT TISSUES: Hernia mesh overlying the lower pelvis bilaterally. BONES: No acute fracture. IMPRESSION: 1. Nonobstructive bowel gas pattern. Contrast extends from the cecum to the rectum. Electronically signed by: Pinkie Pebbles MD 05/05/2024 07:41 PM EST RP Workstation: [id number]   DG Abdomen 1 View Result Date: 05/04/2024 EXAM: 1 VIEW XRAY OF THE ABDOMEN 05/04/2024 02:18:47 PM COMPARISON: CT abdomen and pelvis 05/04/2024. CLINICAL HISTORY: confirm NG FINDINGS: LINES, TUBES AND DEVICES: Enteric tube in place below the diaphragm with tip in the mid stomach. BOWEL: Dilated small bowel loops in visualized upper abdomen. SOFT TISSUES:  No abnormal calcifications. BONES: No acute fracture. LUNGS: Patchy airspace opacity in the right mid lung. IMPRESSION: 1. Enteric tube tip terminates in the mid stomach. 2. Dilated small bowel loops in the upper abdomen. 3. Patchy right mid lung airspace opacity. Electronically signed by: Greig Pique MD 05/04/2024 03:17 PM EST RP Workstation: [id number]   CT Angio Chest PE W and/or Wo Contrast Result Date: 05/04/2024 CLINICAL DATA:  Pulmonary embolism suspected, high probability. Abdominal pain, nausea, difficulty breathing, cough. Leg and ankle swelling. Low O2 sat. EXAM: CT ANGIOGRAPHY CHEST CT ABDOMEN AND PELVIS WITH CONTRAST TECHNIQUE: Multidetector CT imaging of the chest was performed using the standard protocol during bolus administration of intravenous contrast. Multiplanar CT image reconstructions and MIPs were obtained to evaluate the vascular anatomy. Multidetector CT imaging of the abdomen and pelvis was performed using the standard protocol during bolus administration of intravenous contrast. RADIATION DOSE REDUCTION: This exam was performed according to the departmental dose-optimization program which includes automated exposure control, adjustment of the mA and/or kV according to patient size and/or use of iterative reconstruction technique. CONTRAST:  OMNIPAQUE  IOHEXOL  350 MG/ML SOLN COMPARISON:  CT chest 11/05/2023 and CT abdomen pelvis 01/02/2024. FINDINGS: CTA CHEST FINDINGS Cardiovascular: Image quality is degraded by respiratory motion. Negative for pulmonary embolus. Atherosclerotic calcification of the aorta and coronary arteries. Aortic valve replacement. Mitral annulus calcification. Enlarged pulmonic trunk and heart. No pericardial effusion. Ascending aorta measures 3.8 cm (7/89). Mediastinum/Nodes: Thoracic inlet lymph nodes are not enlarged by CT size criteria. Mediastinal lymph nodes measure up to 1.7 cm in the low right paratracheal station, minimally larger but present  dating back to 04/14/2020. 1.6 cm right hilar lymph node. No left hilar or axillary adenopathy. Air in fluid in the esophagus are indicative of dysmotility. Lungs/Pleura: Image quality is degraded by expiratory phase imaging, creating added density in the lungs. Patchy rounded consolidation in the right upper lobe. Additional rounded areas of consolidation in the left lower lobe. Nodule in the inferior posterolateral left lower lobe is seen along the left hemidiaphragm and measures 1.3 x 1.6 cm (6/100), unchanged from 04/14/2020 and considered benign. No pleural fluid. Airway is unremarkable. Musculoskeletal: Degenerative changes in the spine. Chronic mild compression of the T4 and T5 superior endplates. Review of the MIP images confirms the above findings. CT ABDOMEN and PELVIS FINDINGS Hepatobiliary: Liver is enlarged, 19.9 cm. Gallstone. No biliary ductal dilatation. Pancreas: Negative. Spleen: Enlarged, 15.2 cm. Adrenals/Urinary Tract: Adrenal glands are unremarkable. Low-attenuation lesions in the kidneys. No specific follow-up necessary. Kidneys are otherwise unremarkable. Ureters are decompressed. Bladder is grossly unremarkable. Stomach/Bowel: Stomach is distended with fluid. Dilated and fluid-filled jejunum with a transition point in the ventral midline low abdomen (2/51). Remainder of the small bowel is acutely decompressed. There is unobstructed small bowel extending into a right inguinal hernia. Appendix and colon are unremarkable. Vascular/Lymphatic: Atherosclerotic calcification of the aorta. No pathologically  enlarged lymph nodes. Reproductive: Prostate is slightly enlarged. Other: Bilateral inguinal hernia repair. Large right inguinal hernia contains unobstructed small bowel. Small left inguinal hernia contains fat. Trace interloop fluid. No mesenteric edema. Musculoskeletal: Degenerative changes in the spine. Review of the MIP images confirms the above findings. IMPRESSION: 1. Negative for pulmonary  embolus. 2. High-grade small bowel obstruction with transition point in the low ventral midline abdomen. Minimal associated interloop fluid. No mesenteric edema. 3. Right upper and left lower lobe pneumonia. Associated enlargement of mediastinal lymph nodes, likely reactive in etiology. 4. Bilateral inguinal hernia repair with a large right inguinal hernia containing unobstructed small bowel and a small left inguinal hernia contains fat. 5. Mild hepatosplenomegaly, improved from 01/02/2024. 6. Cholelithiasis. 7. Mildly enlarged prostate. 8. Aortic atherosclerosis (ICD10-I70.0). Coronary artery calcification. 9. Enlarged pulmonic trunk, indicative of pulmonary arterial hypertension. Electronically Signed   By: Newell Eke M.D.   On: 05/04/2024 13:29   CT ABDOMEN PELVIS W CONTRAST Result Date: 05/04/2024 CLINICAL DATA:  Pulmonary embolism suspected, high probability. Abdominal pain, nausea, difficulty breathing, cough. Leg and ankle swelling. Low O2 sat. EXAM: CT ANGIOGRAPHY CHEST CT ABDOMEN AND PELVIS WITH CONTRAST TECHNIQUE: Multidetector CT imaging of the chest was performed using the standard protocol during bolus administration of intravenous contrast. Multiplanar CT image reconstructions and MIPs were obtained to evaluate the vascular anatomy. Multidetector CT imaging of the abdomen and pelvis was performed using the standard protocol during bolus administration of intravenous contrast. RADIATION DOSE REDUCTION: This exam was performed according to the departmental dose-optimization program which includes automated exposure control, adjustment of the mA and/or kV according to patient size and/or use of iterative reconstruction technique. CONTRAST:  OMNIPAQUE  IOHEXOL  350 MG/ML SOLN COMPARISON:  CT chest 11/05/2023 and CT abdomen pelvis 01/02/2024. FINDINGS: CTA CHEST FINDINGS Cardiovascular: Image quality is degraded by respiratory motion. Negative for pulmonary embolus. Atherosclerotic calcification  of the aorta and coronary arteries. Aortic valve replacement. Mitral annulus calcification. Enlarged pulmonic trunk and heart. No pericardial effusion. Ascending aorta measures 3.8 cm (7/89). Mediastinum/Nodes: Thoracic inlet lymph nodes are not enlarged by CT size criteria. Mediastinal lymph nodes measure up to 1.7 cm in the low right paratracheal station, minimally larger but present dating back to 04/14/2020. 1.6 cm right hilar lymph node. No left hilar or axillary adenopathy. Air in fluid in the esophagus are indicative of dysmotility. Lungs/Pleura: Image quality is degraded by expiratory phase imaging, creating added density in the lungs. Patchy rounded consolidation in the right upper lobe. Additional rounded areas of consolidation in the left lower lobe. Nodule in the inferior posterolateral left lower lobe is seen along the left hemidiaphragm and measures 1.3 x 1.6 cm (6/100), unchanged from 04/14/2020 and considered benign. No pleural fluid. Airway is unremarkable. Musculoskeletal: Degenerative changes in the spine. Chronic mild compression of the T4 and T5 superior endplates. Review of the MIP images confirms the above findings. CT ABDOMEN and PELVIS FINDINGS Hepatobiliary: Liver is enlarged, 19.9 cm. Gallstone. No biliary ductal dilatation. Pancreas: Negative. Spleen: Enlarged, 15.2 cm. Adrenals/Urinary Tract: Adrenal glands are unremarkable. Low-attenuation lesions in the kidneys. No specific follow-up necessary. Kidneys are otherwise unremarkable. Ureters are decompressed. Bladder is grossly unremarkable. Stomach/Bowel: Stomach is distended with fluid. Dilated and fluid-filled jejunum with a transition point in the ventral midline low abdomen (2/51). Remainder of the small bowel is acutely decompressed. There is unobstructed small bowel extending into a right inguinal hernia. Appendix and colon are unremarkable. Vascular/Lymphatic: Atherosclerotic calcification of the aorta. No pathologically enlarged  lymph  nodes. Reproductive: Prostate is slightly enlarged. Other: Bilateral inguinal hernia repair. Large right inguinal hernia contains unobstructed small bowel. Small left inguinal hernia contains fat. Trace interloop fluid. No mesenteric edema. Musculoskeletal: Degenerative changes in the spine. Review of the MIP images confirms the above findings. IMPRESSION: 1. Negative for pulmonary embolus. 2. High-grade small bowel obstruction with transition point in the low ventral midline abdomen. Minimal associated interloop fluid. No mesenteric edema. 3. Right upper and left lower lobe pneumonia. Associated enlargement of mediastinal lymph nodes, likely reactive in etiology. 4. Bilateral inguinal hernia repair with a large right inguinal hernia containing unobstructed small bowel and a small left inguinal hernia contains fat. 5. Mild hepatosplenomegaly, improved from 01/02/2024. 6. Cholelithiasis. 7. Mildly enlarged prostate. 8. Aortic atherosclerosis (ICD10-I70.0). Coronary artery calcification. 9. Enlarged pulmonic trunk, indicative of pulmonary arterial hypertension. Electronically Signed   By: Newell Eke M.D.   On: 05/04/2024 13:29   US  Venous Img Lower Bilateral Result Date: 05/04/2024 EXAM: ULTRASOUND DUPLEX OF THE BILATERAL LOWER EXTREMITY VEINS TECHNIQUE: Duplex ultrasound using B-mode/gray scaled imaging and Doppler spectral analysis and color flow was obtained of the deep venous structures of the bilateral lower extremity. COMPARISON: None available. CLINICAL HISTORY: leg swelling FINDINGS: LEFT: The common femoral vein, femoral vein, popliteal vein, and posterior tibial vein demonstrate normal compressibility with normal color flow and spectral analysis. RIGHT: The common femoral vein, femoral vein, popliteal vein, and posterior tibial vein demonstrate normal compressibility with normal color flow and spectral analysis. IMPRESSION: 1. No evidence of deep venous thrombosis. Electronically signed by:  Evalene Coho MD 05/04/2024 12:21 PM EST RP Workstation: [id number]   DG Chest Port 1 View Result Date: 05/04/2024 CLINICAL DATA:  Abdominal pain and nausea with difficulty breathing and mild cough for few days. EXAM: PORTABLE CHEST 1 VIEW COMPARISON:  None Available. FINDINGS: Multiple sternal wires are present. The cardiac silhouette is mildly enlarged and unchanged in size. An artificial cardiac valve is seen. Mild to moderate severity infiltrate is seen within the periphery of the mid right lung. Mild left basilar atelectasis and/or infiltrate is also present. No pleural effusion or pneumothorax is identified. Multilevel degenerative changes seen throughout the thoracic spine. IMPRESSION: 1. Mild to moderate severity right mid lung infiltrate. 2. Mild left basilar atelectasis and/or infiltrate. Electronically Signed   By: Suzen Dials M.D.   On: 05/04/2024 11:32    Anti-infectives: Anti-infectives (From admission, onward)    Start     Dose/Rate Route Frequency Ordered Stop   05/05/24 1200  cefTRIAXone  (ROCEPHIN ) 2 g in sodium chloride  0.9 % 100 mL IVPB        2 g 200 mL/hr over 30 Minutes Intravenous Every 24 hours 05/05/24 0223     05/05/24 0600  doxycycline  (VIBRAMYCIN ) 100 mg in sodium chloride  0.9 % 250 mL IVPB        100 mg 125 mL/hr over 120 Minutes Intravenous Every 12 hours 05/05/24 0223     05/04/24 1230  cefTRIAXone  (ROCEPHIN ) 2 g in sodium chloride  0.9 % 100 mL IVPB        2 g 200 mL/hr over 30 Minutes Intravenous  Once 05/04/24 1220 05/04/24 1335   05/04/24 1230  azithromycin  (ZITHROMAX ) tablet 500 mg  Status:  Discontinued        500 mg Oral Daily 05/04/24 1220 05/04/24 1947        Assessment/Plan  SBO, likely related to intra-abdominal adhesions from previous partial colectomy. -  Per chart review he had resection of a diverticular  sigmoid stricture in 2007 by Dr. Eletha, underwent laparoscopic inguinal hernia repair in 2000 by Dr. Lily.  - CT scan of the  abdomen and pelvis on 1/19 shows high-grade bowel obstruction with transition zone in the mid abdomen.  No evidence of pneumoperitoneum, pneumatosis.  Benign exam - S/P SBO protocol >> Contrast in the colon. Having flatus and BMs. D/C NGT and start full liquids. ADAT to soft.   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 QT prolongation History of mechanical aortic valve Seizure disorder on IV Keppra  Chronic thrombocytopenia, on a prolonged prednisone  taper, transition to daily IV Solu-Medrol  in the hospital     LOS: 2 days   I reviewed Consultant cardiology notes, hospitalist notes, last 24 h vitals and pain scores, last 48 h intake and output, last 24 h labs and trends, and last 24 h imaging results.  This care required moderate level of medical decision making.   Almarie Pringle, PA-C [facility] Please see Amion for pager number during day hours 7:00am-4:30pm

## 2024-05-06 NOTE — Progress Notes (Signed)
 Heart Failure Navigator Progress Note  Assessed for Heart & Vascular TOC clinic readiness.  Patient does not meet criteria. Elevated BNP on admission but per cardiology, seems to be dehydrated and not in CHF exacerbation. Will not schedule HF TOC appt at this time.   Navigator available for reassessment of patient.   Duwaine Plant, PharmD, BCPS Heart Failure Stewardship Pharmacist Phone [phone]

## 2024-05-06 NOTE — Progress Notes (Signed)
 "  Progress Note  Patient Name: Alan Willis Date of Encounter: 05/06/2024 [facility] Cardiologist: Redell Shallow, MD   Interval Summary   Patient is doing well today, feeling much better overall. Eager to eat and drink. No new complaints or concerns. Denies any chest pain, dyspnea, palpitations, or lower extremity swelling  Vital Signs Vitals:   05/06/24 0144 05/06/24 0344 05/06/24 0745 05/06/24 0842  BP: (!) 112/95 107/88 (!) 114/91   Pulse: (!) 124 (!) 124 (!) 119 (!) 117  Resp: 18 17 17 16   Temp: 98 F (36.7 C) 98 F (36.7 C) 97.9 F (36.6 C)   TempSrc: Oral Oral Oral   SpO2: 91% 96% (!) 85% 100%  Weight:  74.8 kg    Height:        Intake/Output Summary (Last 24 hours) at 05/06/2024 0923 Last data filed at 05/06/2024 9095 Gross per 24 hour  Intake 327.2 ml  Output 1450 ml  Net -1122.8 ml      05/06/2024    3:44 AM 05/04/2024   11:21 PM 05/04/2024   10:00 PM  Last 3 Weights  Weight (lbs) 164 lb 14.4 oz 165 lb 9.1 oz 165 lb 9.1 oz  Weight (kg) 74.798 kg 75.1 kg 75.1 kg      Telemetry/ECG  2:1 atrial flutter with RVR in the 110s - Personally Reviewed  Physical Exam  GEN: No acute distress. NG tube in place Neck: No JVD Cardiac: Irregularly irregular rhythm, tachycardic; systolic click from AVR; 2/6 holosystolic murmur at RUSB Respiratory: Decreased breath sounds in LLL GI: Soft, nontender MS: No peripheral edema  Assessment & Plan  Atrial fibrillation/flutter with RVR History of multiple cardioversions, most recently in 02/2023. Felt to be Tikosyn  failure at the time, instead on amiodarone  100 mg daily at home. 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 Given IV diltiazem  10 mg in the ED and started on  amiodarone  bolus/drip Telemetry shows atrial flutter with RVR in the 110s Continue amiodarone  gtt Per surgery, NG tube will be discontinued today with full liquid diet. Plan to transition IV amiodarone  to home PO amiodarone  once able to tolerate   Acute on chronic HFmrEF Presented with several days of dyspnea and cough.  ProBNP 4,570 CT chest showed RUL/LLL PNA No pulmonary edema on imaging Last echo 11/01/23 showed LVEF 40-45%, global HK, mild LVH, mildly reduced RV systolic function, RVSP 59.9 mmHg, severe BAE, mod-severe MR, mod-severe MS with mean gradient 7.0 mmHg, s/p AV replacement with mean gradient 16.7 mmHg Home meds: jardiance  10 mg daily, Lasix  20 mg daily Suspect lower extremity edema on presentation likely secondary to chronic steroid therapy rather than ADHF. S/p IV Lasix  40 mg x 1 dose in the ED. No evidence of pulmonary edema on imaging. No evidence of hypervolemia on exam otherwise, therefore no indication for diuresis at this time   Sepsis secondary to multifocal pneumonia Acute hypoxemic respiratory failure, in the setting of PNA and ADHF Presented with fever 100.2 F, tachycardia, tachypnea, and leukocytosis Lactate 2.0 >> 1.8, soft BP/no hypotension CT chest c/w RUL/LLL pneumonia, respiratory panel negative On ceftriaxone  and doxycycline . Avoid macrolides due to QT prolongation Ongoing management per primary   High-grade small bowel obstruction Presented with several days of nausea and lower abdominal pain CT showed high-grade  SBO with transition point in the low ventral midline abdomen, minimal associated interloop liquid NG tube in place, general surgery following Ongoing management per primary   S/p mechanical aortic valve replacement Severe mitral regurgitation, severe mitral stenosis Last echo as above Warfarin as home anticoagulation Continue IV heparin    Per primary Sarcoidosis/chronic thrombocytopenia Seizure disorder  For questions or updates, please  contact [facility] Please consult [email or web address] for contact info under         Signed, Owen MARLA Daniels, PA-C   "

## 2024-05-06 NOTE — Evaluation (Signed)
 RT Evaluate and Treat Note  05/06/2024   Breathing is (select one): Better than normal   The following was found on auscultation (select multiple):  Bilateral Breath Sounds: Diminished (05/06/24 0700)  R Upper  Breath Sounds: Diminished (05/06/24 0700) L Upper Breath Sounds: Diminished (05/06/24 0700) R Lower Breath Sounds: Diminished (05/06/24 0700) L Lower Breath Sounds: Diminished (05/06/24 0700)    Cough Assessment: Cough: Productive (05/06/24 0700)    Most Recent Chest Xray:... (No results found.  MPRESSION: 1. Mild to moderate severity right mid lung infiltrate. 2. Mild left basilar atelectasis and/or infiltrate.   The following medications and/or interventions were ordered/changed/discontinued as part of the Respiratory Treatment protocol:   Medication Changes: No Change   Airway Clearance Changes: Chest Physiotherapy (bed, vest) Changed and Flutter Valve Ordered   Oxygen Therapy Changes:No Change

## 2024-05-06 NOTE — Evaluation (Signed)
 Physical Therapy Evaluation Patient Details Name: Alan Willis MRN: [id number] DOB: 08/23/1945 Today's Date: 05/06/2024  History of Present Illness  79 y.o. male presents to [facility] 05/04/24 with SOB, cough, and fever. Admitted with sepsis as well as acute hypoxemic respiratory failure in setting of PNA and decompensated HF. Developed a-fib with RVR, possible DCCV 1/22. 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 Pt presents with decreased activity tolerance and impaired cardiopulmonary status. Pt was ModI for bed mobility/transfers with supervision to ambulate 4 x 31ft with no AD. Frequent standing rest breaks due to pt feeling SOB, vitals below. Pt has 24/7 assist available upon d/c home. Anticipate pt will have no post-acute PT needs with acute PT to follow.   95% SpO2 on RA at rest 89-93% SpO2 on RA during gait   HR- 110s BPM      If plan is discharge home, recommend the following: Assist for transportation;Help with stairs or ramp for entrance   Can travel by private vehicle    Yes    Equipment Recommendations None recommended by PT     Functional Status Assessment Patient has had a recent decline in their functional status and demonstrates the ability to make significant improvements in function in a reasonable and predictable amount of time.     Precautions / Restrictions Precautions Precautions: Fall Recall of Precautions/Restrictions: Intact Precaution/Restrictions Comments: watch O2 Restrictions Weight Bearing Restrictions Per Provider Order: No      Mobility  Bed Mobility Overal bed mobility: Modified Independent   Transfers Overall  transfer level: Modified independent Equipment used: None       Ambulation/Gait Ambulation/Gait assistance: Supervision Gait Distance (Feet): 80 Feet (x4) Assistive device: None Gait Pattern/deviations: Step-through pattern, Decreased stride length, Wide base of support Gait velocity: decr    General Gait Details: slightly unsteady gait with wide BOS. Frequent standing rest breaks due to SOB, vitals in chart    Balance Overall balance assessment: Needs assistance Sitting-balance support: No upper extremity supported, Feet supported Sitting balance-Leahy Scale: Normal    Standing balance support: No upper extremity supported Standing balance-Leahy Scale: Good       Pertinent Vitals/Pain Pain Assessment Pain Assessment: No/denies pain    Home Living Family/patient expects to be discharged to:: Private residence Living Arrangements: Spouse/significant other Available Help at Discharge: Family;Available 24 hours/day Type of Home: House Home Access: Stairs to enter Entrance Stairs-Rails: Right;Left;Can reach both Entrance Stairs-Number of Steps: 2 Alternate Level Stairs-Number of Steps: 12 Home Layout: Two level Home Equipment: Shower seat - built in;Grab bars - toilet;Grab bars - tub/shower      Prior Function Prior Level of Function : Independent/Modified Independent;Driving    Mobility Comments: Ind with no AD, ADLs Comments: Ind, retired mudlogger     Extremity/Trunk Assessment   Upper Extremity Assessment Upper Extremity Assessment: Overall WFL for tasks assessed    Lower Extremity Assessment Lower Extremity Assessment: Overall WFL for tasks assessed    Cervical / Trunk Assessment Cervical / Trunk Assessment: Normal  Communication   Communication Communication: Impaired Factors Affecting Communication: Hearing impaired    Cognition Arousal: Alert Behavior During Therapy: WFL for tasks assessed/performed   PT - Cognitive impairments: No  apparent impairments  Following commands: Intact       Cueing Cueing Techniques: Verbal cues, Tactile cues      PT Assessment Patient needs continued PT services  PT Problem List Decreased activity tolerance;Decreased balance;Decreased mobility;Cardiopulmonary status limiting activity       PT Treatment Interventions DME instruction;Gait training;Stair training;Functional mobility training;Therapeutic activities;Therapeutic exercise;Balance training;Neuromuscular re-education;Patient/family education    PT Goals (Current goals can be found in the Care Plan section)  Acute Rehab PT Goals Patient Stated Goal: to feel better PT Goal Formulation: With patient Time For Goal Achievement: 05/20/24 Potential to Achieve Goals: Good    Frequency Min 1X/week        AM-PAC PT 6 Clicks Mobility  Outcome Measure Help needed turning from your back to your side while in a flat bed without using bedrails?: None Help needed moving from lying on your back to sitting on the side of a flat bed without using bedrails?: None Help needed moving to and from a bed to a chair (including a wheelchair)?: None Help needed standing up from a chair using your arms (e.g., wheelchair or bedside chair)?: None Help needed to walk in hospital room?: A Little Help needed climbing 3-5 steps with a railing? : A Little 6 Click Score: 22    End of Session   Activity Tolerance: Patient tolerated treatment well Patient left: in chair;with call bell/phone within reach;with chair alarm set Nurse Communication: Mobility status (SpO2) PT Visit Diagnosis: Other abnormalities of gait and mobility (R26.89)    Time: 8681-8660 PT Time Calculation (min) (ACUTE ONLY): 21 min   Charges:   PT Evaluation $PT Eval Low Complexity: 1 Low   PT General Charges $$ ACUTE PT VISIT: 1 Visit       Kate ORN, PT, DPT Secure Chat Preferred  Rehab Office [phone]   Kate BRAVO [name] 05/06/2024, 1:44 PM

## 2024-05-07 ENCOUNTER — Encounter (HOSPITAL_COMMUNITY): Payer: Self-pay | Admitting: Cardiology

## 2024-05-07 ENCOUNTER — Telehealth: Payer: Self-pay | Admitting: Cardiology

## 2024-05-07 ENCOUNTER — Inpatient Hospital Stay (HOSPITAL_COMMUNITY): Admitting: Anesthesiology

## 2024-05-07 ENCOUNTER — Other Ambulatory Visit: Payer: Self-pay | Admitting: Hematology and Oncology

## 2024-05-07 ENCOUNTER — Encounter (HOSPITAL_COMMUNITY)
Admission: EM | Disposition: A | Discharge status: Home / Self Care | Payer: Self-pay | Source: Home / Self Care | Attending: Internal Medicine

## 2024-05-07 DIAGNOSIS — I4891 Unspecified atrial fibrillation: Secondary | ICD-10-CM

## 2024-05-07 DIAGNOSIS — I050 Rheumatic mitral stenosis: Secondary | ICD-10-CM

## 2024-05-07 DIAGNOSIS — J188 Other pneumonia, unspecified organism: Secondary | ICD-10-CM | POA: Diagnosis not present

## 2024-05-07 DIAGNOSIS — I342 Nonrheumatic mitral (valve) stenosis: Secondary | ICD-10-CM

## 2024-05-07 DIAGNOSIS — I4892 Unspecified atrial flutter: Secondary | ICD-10-CM

## 2024-05-07 DIAGNOSIS — D696 Thrombocytopenia, unspecified: Secondary | ICD-10-CM

## 2024-05-07 HISTORY — PX: CARDIOVERSION: EP1203

## 2024-05-07 LAB — BASIC METABOLIC PANEL WITH GFR
Anion gap: 12 (ref 5–15)
BUN: 33 mg/dL — ABNORMAL HIGH (ref 8–23)
CO2: 23 mmol/L (ref 22–32)
Calcium: 8.5 mg/dL — ABNORMAL LOW (ref 8.9–10.3)
Chloride: 101 mmol/L (ref 98–111)
Creatinine, Ser: 1.34 mg/dL — ABNORMAL HIGH (ref 0.61–1.24)
GFR, Estimated: 55 mL/min — ABNORMAL LOW
Glucose, Bld: 104 mg/dL — ABNORMAL HIGH (ref 70–99)
Potassium: 4.2 mmol/L (ref 3.5–5.1)
Sodium: 136 mmol/L (ref 135–145)

## 2024-05-07 LAB — CBC
HCT: 38.2 % — ABNORMAL LOW (ref 39.0–52.0)
Hemoglobin: 12.6 g/dL — ABNORMAL LOW (ref 13.0–17.0)
MCH: 30.7 pg (ref 26.0–34.0)
MCHC: 33 g/dL (ref 30.0–36.0)
MCV: 92.9 fL (ref 80.0–100.0)
Platelets: 91 K/uL — ABNORMAL LOW (ref 150–400)
RBC: 4.11 MIL/uL — ABNORMAL LOW (ref 4.22–5.81)
RDW: 18.1 % — ABNORMAL HIGH (ref 11.5–15.5)
WBC: 12.3 K/uL — ABNORMAL HIGH (ref 4.0–10.5)
nRBC: 0 % (ref 0.0–0.2)

## 2024-05-07 LAB — PROTIME-INR
INR: 3.7 — ABNORMAL HIGH (ref 0.8–1.2)
Prothrombin Time: 38.1 s — ABNORMAL HIGH (ref 11.4–15.2)

## 2024-05-07 MED ORDER — PROPOFOL 10 MG/ML IV BOLUS
INTRAVENOUS | Status: DC | PRN
  Administered 2024-05-07: 20 mg via INTRAVENOUS
  Administered 2024-05-07: 30 mg via INTRAVENOUS
  Administered 2024-05-07: 20 mg via INTRAVENOUS

## 2024-05-07 MED ORDER — SODIUM CHLORIDE 0.9 % IV SOLN: INTRAVENOUS | Status: DC

## 2024-05-07 MED ORDER — SODIUM CHLORIDE 0.9% FLUSH
INTRAVENOUS | Status: DC | PRN
  Administered 2024-05-07 (×3): 5 mL via INTRAVENOUS

## 2024-05-07 NOTE — Telephone Encounter (Signed)
 Pt's wife would like a c/b regarding pts discharge, please advise.

## 2024-05-07 NOTE — CV Procedure (Signed)
" ° °  Electrical Cardioversion Procedure Note GULED GAHAN [id number] March 13, 1946  Procedure: Electrical Cardioversion Indications:  Atrial Fibrillation  Time Out: Verified patient identification, verified procedure,medications/allergies/relevent history reviewed, required imaging and test results available.  Performed  Procedure Details  The patient signed informed consent.   The patient was NPO past midnight. Has had therapeutic anticoagulation with Coumadin  greater than 3 weeks. The patient denies any interruption of anticoagulation.  Anesthesia was administered by Dr. Leopoldo.  Adequate airway was maintained throughout and vital followed per protocol.  He was cardioverted x 1 with 200J of biphasic synchronized energy.  He converted to NSR.  There were no apparent complications.  The patient tolerated the procedure well and had normal neuro status and respiratory status post procedure with vitals stable as recorded elsewhere.     IMPRESSION:  Successful cardioversion of atrial fibrillation   Follow up:  he will be transferred to the medical floors  Holdan Stucke 05/07/2024, 10:24 AM   "

## 2024-05-07 NOTE — Transfer of Care (Signed)
 Immediate Anesthesia Transfer of Care Note  Patient: Alan Willis  Procedure(s) Performed: CARDIOVERSION  Patient Location: PACU  Anesthesia Type:MAC  Level of Consciousness: sedated  Airway & Oxygen Therapy: Patient Spontanous Breathing and Patient connected to nasal cannula oxygen  Post-op Assessment: Report given to RN and Post -op Vital signs reviewed and stable  Post vital signs: Reviewed and stable  Last Vitals:  Vitals Value Taken Time  BP 93/65 05/07/24 10:23  Temp 36.7 C 05/07/24 10:23  Pulse 59 05/07/24 10:24  Resp 15 05/07/24 10:24  SpO2 90 % 05/07/24 10:24  Vitals shown include unfiled device data.  Last Pain:  Vitals:   05/07/24 1023  TempSrc: Tympanic  PainSc: Asleep         Complications: There were no known notable events for this encounter.

## 2024-05-07 NOTE — Progress Notes (Signed)
 "  Rounding Note   Patient Name: Alan Willis Date of Encounter: 05/07/2024  [facility] Cardiologist: Redell Shallow, MD   Subjective  - No acute events overnight - Patient remains in atrial flutter with RVR - He states that he feels well and has no complaints  Scheduled Meds:  amiodarone   400 mg Oral BID   Followed by   NOREEN ON 05/13/2024] amiodarone   200 mg Oral BID   Followed by   NOREEN ON 05/20/2024] amiodarone   200 mg Oral Daily   doxycycline   100 mg Oral Q12H   levETIRAcetam   500 mg Oral Daily   levETIRAcetam   750 mg Oral QHS   predniSONE   20 mg Oral Q breakfast   Warfarin - Pharmacist Dosing Inpatient   Does not apply q1600   Continuous Infusions:  sodium chloride  75 mL/hr at 05/07/24 0415   cefTRIAXone  (ROCEPHIN )  IV 2 g (05/07/24 1126)   PRN Meds: acetaminophen  **OR** acetaminophen , fentaNYL  (SUBLIMAZE ) injection, naLOXone  (NARCAN )  injection, sodium chloride  HYPERTONIC   Vital Signs  Vitals:   05/07/24 1023 05/07/24 1033 05/07/24 1043 05/07/24 1053  BP: 93/65 94/69 104/71 101/70  Pulse: 62 64 62 (!) 59  Resp: 17 18 18 19   Temp: 98.1 F (36.7 C)     TempSrc: Tympanic     SpO2: 94% 91% 90% 94%  Weight:      Height:        Intake/Output Summary (Last 24 hours) at 05/07/2024 1155 Last data filed at 05/07/2024 0612 Gross per 24 hour  Intake 550.06 ml  Output 1100 ml  Net -549.94 ml      05/07/2024    5:00 AM 05/06/2024    3:44 AM 05/04/2024   11:21 PM  Last 3 Weights  Weight (lbs) 168 lb 11.2 oz 164 lb 14.4 oz 165 lb 9.1 oz  Weight (kg) 76.522 kg 74.798 kg 75.1 kg      Telemetry Atrial flutter with RVR- Personally Reviewed  ECG  Atrial flutter with RVR- Personally Reviewed  Physical Exam  General: Well nourished, well developed, in no acute distress HEENT: Atraumatic, normocephalic. No JVD Cardiac: Tachycardic with irregularly irregular rhythm; systolic click from AVR; II/VI systolic murmur at the RUSB, harsh III/VI diastolic  murmur at apex, no rubs or gallops Lungs: Bibasilar rales, no wheezing or rhonchi Abd: Soft, nontender, nondistended, normoactive bowel sounds Ext: No edema, radial pulses 2+ Musculoskeletal: No deformities, BUE and BLE strength normal and equal Skin: Warm and dry, no rashes   Neuro: Alert and oriented to person, place, time, and situation, CNII-XII grossly intact, no gross deficits  Psych: Normal mood and affect   Labs High Sensitivity Troponin:  No results for input(s): TROPONINIHS in the last 720 hours.  Recent Labs  Lab 05/04/24 1027 05/05/24 0420  TRNPT 86* 74*       Chemistry Recent Labs  Lab 05/05/24 0420 05/06/24 0451 05/07/24 0355  NA 140 139 136  K 4.1 4.2 4.2  CL 101 99 101  CO2 26 23 23   GLUCOSE 91 131* 104*  BUN 28* 34* 33*  CREATININE 1.49* 1.63* 1.34*  CALCIUM  8.5* 9.2 8.5*  MG 2.2  --   --   PROT 5.9*  --   --   ALBUMIN  3.5  --   --   AST 22  --   --   ALT 28  --   --   ALKPHOS 75  --   --   BILITOT 2.2*  --   --  GFRNONAA 48* 43* 55*  ANIONGAP 12 17* 12    Lipids No results for input(s): CHOL, TRIG, HDL, LABVLDL, LDLCALC, CHOLHDL in the last 168 hours.  Hematology Recent Labs  Lab 05/05/24 0420 05/06/24 0451 05/07/24 0355  WBC 14.9* 14.2* 12.3*  RBC 4.48 4.73 4.11*  HGB 13.7 14.3 12.6*  HCT 41.2 45.2 38.2*  MCV 92.0 95.6 92.9  MCH 30.6 30.2 30.7  MCHC 33.3 31.6 33.0  RDW 18.3* 18.1* 18.1*  PLT 82* 104* 91*   Thyroid  No results for input(s): TSH, FREET4 in the last 168 hours.  BNP Recent Labs  Lab 05/04/24 1027  PROBNP 4,570.0*    DDimer  Recent Labs  Lab 05/04/24 1027  DDIMER 0.68*     Radiology  EP STUDY Result Date: 05/07/2024 See surgical note for result.  DG Abd 1 View Result Date: 05/05/2024 CLINICAL DATA:  NG tube placement EXAM: ABDOMEN - 1 VIEW COMPARISON:  05/05/2024, 05/04/2024 . FINDINGS: sternotomy and valve prosthesis. Streaky densities at the left base. Enteric tube tip and side-port overlie  the stomach. Enteral contrast within the colon IMPRESSION: Enteric tube tip and side-port overlie the stomach. Electronically Signed   By: Luke Bun M.D.   On: 05/05/2024 21:02   DG Abd Portable 1V-Small Bowel Obstruction Protocol-initial, 8 hr delay Result Date: 05/05/2024 EXAM: 1 VIEW XRAY OF THE ABDOMEN 05/05/2024 07:00:00 PM COMPARISON: 05/04/2024 CLINICAL HISTORY: Small bowel obstruction FINDINGS: BOWEL: Nonobstructive bowel gas pattern with contrast extending from the cecum to the rectum. SOFT TISSUES: Hernia mesh overlying the lower pelvis bilaterally. BONES: No acute fracture. IMPRESSION: 1. Nonobstructive bowel gas pattern. Contrast extends from the cecum to the rectum. 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 Cardiology is consulted for the evaluation of atrial fibrillation with RVR and CHF.   Assessment & Plan    #PAF/AFlutter w/ RVR - Patient developed atrial fibrillation with RVR in the setting of pulmonary infiltrates concerning for pneumonia and an SBO. - These 2 acute illnesses are certainly the driver's of his RVR and his RVR will likely improve with resolution of these illnesses. - He was initially managed with IV amiodarone , but his SBO has resolved and he is able to take p.o. now. -Telemetry as of 05/06/2024 is suggestive of atrial arrhythmia and ECG confirmed atrial flutter with RVR Continue p.o. amiodarone  load The patient remains in atrial flutter with RVR.  He is agreeable to pursue DCCV.  Will add on today. Patient has been n.p.o. since midnight Recheck INR in a.m. Maintain telemetry    #Chronic HFmrEF - The patient was noted to have an elevated BNP on admission; however, he has no evidence of volume overload by physical exam. - In fact, I suspect that the patient may be somewhat dehydrated in the setting of his SBO and pneumonia. - I recommend holding his oral Lasix  and holding off on additional IV Lasix  at this time. Hold p.o. Lasix  20 mg daily Holding home Jardiance   Strict I's and O's   #Severe AS s/p Mechanical AVR - AVR was performed remotely and he has been on warfarin chronically. - The patient can now take p.o. so we  will restart warfarin Recheck INR in a.m. Continue warfarin per pharmacy Outpatient INR goal = 2.5   #Moderate-Severe MS/MR - Currently not causing any acute issues. -Would favor slower heart rates to increase diastolic filling time. Treatment of tachycardia as above CTM   #AKI on CKD - I suspect that this is prerenal and would favor rehydration given SBO and sepsis. Daily BMP   #PNA #SBO #Seizures Per primary        For questions or updates, please contact [facility] Please consult [email or web address] for contact info under       Signed, Georganna Archer, MD  05/07/2024, 11:55 AM    "

## 2024-05-07 NOTE — Progress Notes (Signed)
 79 y.o. male we are following for SBO.  Xray w/ contrast in colon on last check. Currently on a soft diet and having bowel movements.  Appears SBO is resolved. General surgery will sign off. Please call back with questions or concerns.   Ozell CHRISTELLA Shaper , [facility] 05/07/2024, 12:40 PM Please see Amion for pager number during day hours 7:00am-4:30pm

## 2024-05-07 NOTE — Evaluation (Signed)
 RT Evaluate and Treat Note  05/07/2024   Breathing is (select one): Better than normal   The following was found on auscultation (select multiple):  Bilateral Breath Sounds: Diminished (05/07/24 0800)  R Upper  Breath Sounds: Diminished (05/07/24 0800) L Upper Breath Sounds: Diminished (05/07/24 0800) R Lower Breath Sounds: Diminished (05/07/24 0800) L Lower Breath Sounds: Diminished (05/07/24 0800)    Cough Assessment: Cough: Productive (05/07/24 0750)    Most Recent Chest Xray:... (No results found.    The following medications and/or interventions were ordered/changed/discontinued as part of the Respiratory Treatment protocol:   Medication Changes: No Change   Airway Clearance Changes: No Change   Oxygen Therapy Changes:No Change

## 2024-05-07 NOTE — Progress Notes (Signed)
 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  On presentation he was in a flutter with rate in the range of 140s, stable blood pressure, fever around 1.2.  WBC count 28,000, creatinine of 1.3, proBNP of 4570, lactic acid was 2.  CTA chest negative for PE but showed right upper and left lower lobe pneumonia.  CT abdomen/pelvis showed high-grade small bowel obstruction with transition point in the lower ventral midline abdomen.  General surgery, cardiology consulted.  Started on antibiotics for pneumonia.  Started on IV amiodarone .  Underwent cardioversion today.  Possible discharge home tomorrow  Assessment & Plan:  Principal Problem:   Multifocal pneumonia Active Problems:   Atrial flutter with rapid ventricular response (HCC)   Atrial fibrillation with rapid ventricular response (HCC)   Acute exacerbation of CHF (congestive heart failure) (HCC)   Acute respiratory failure with hypoxia (HCC)   Sepsis (HCC)  Sepsis secondary to multifocal pneumonia/acute hypoxic respiratory failure: Presented with fever, tachycardia, tachypnea, leukocytosis.  Hypoxic on presentation and was put on high flow  oxygen.  Currently on room air.  Lactic acid was mildly elevated.  Not hypotensive.  CT chest showed right upper/left lower lobe pneumonia.  COVID/flu/RSV negative.  Continue current antibiotics, follow-up cultures.  This could also be from aspiration from small bowel obstruction.  Respiratory status has significantly improved.  SBO: Presented with abdominal pain.CT abdomen/pelvis showed high-grade small bowel  obstruction with transition point in the lower ventral midline abdomen.  General surgery were following.   Currently on full liquid diet.  Abdomen is soft, nontender, nondistended today.  Passing gas. NG tube removed.  Will check with general surgery if we can upgrade his diet  A-fib with RVR: Takes warfarin at home.  Started on amiodarone  drip.  Warfarin on hold due to high-grade SBO.  Plan to start on heparin  drip when INR falls below 2.  INR 3.7 today.  Underwent cardioversion on 1/22  Acute on chronic HFmrEF: Last echo in July 2025 showed EF of 40 to 45%, global hypokinesis, right ventricular systolic function mildly reduced, moderate elevated pulmonary artery systolic pressure, severe biatrial dilatation.  Elevated BNP of 4570.  Takes Lasix  20 mg daily at home.  Given IV Lasix  in the emergency department.  Monitoring input/output, daily weight.  Cardiology following. Had mild elevated troponin, from demand ischemia.  Appears euvolemic.  AKI on CKD stage IIIa: baseline creatinine around 1.3.  Creatinine slightly trending up to the range of 1.6.  Continue to monitor  History of moderate to severe mitral regurgitation/stenosis, mechanical aortic valve replacement: Follows up with cardiology.  Takes warfarin.  Goal INR 2.5-3  Seizure disorder: Continue Keppra   Chronic thrombocytopenia: Follows with hematology.  On prolonged prednisone  taper.  Currently platelets, stable.  Currently on prednisone  20 mg daily.  Prolonged QTc: Continue to monitor on telemetry.  Leukocytosis: This is most likely secondary to steroids.  Continue to monitor  Deconditioning: PT saw him on 1/21, no follow-up recommended.            DVT prophylaxis:None, INR is therapeutic warfarin (COUMADIN ) tablet 1 mg     Code Status: Full Code  Family Communication: Wife at bedside on 1/21  Patient status: Inpatient  Patient is from : Home  Anticipated discharge to: Home  Estimated DC date: 1 to 2 days, likely  tomorrow   Consultants: Cardiology, general surgery  Procedures: Cardioversion  Antimicrobials:  Anti-infectives (From admission, onward)    Start     Dose/Rate Route Frequency Ordered Stop   05/06/24 2200  doxycycline  (VIBRA -TABS) tablet 100 mg        100 mg Oral Every 12 hours 05/06/24 1038 05/08/24 2359   05/05/24 1200  cefTRIAXone  (ROCEPHIN ) 2 g in sodium chloride  0.9 % 100 mL IVPB        2 g 200 mL/hr over 30 Minutes Intravenous Every 24 hours 05/05/24 0223     05/05/24 0600  doxycycline  (VIBRAMYCIN ) 100 mg in sodium chloride  0.9 % 250 mL IVPB  Status:  Discontinued        100 mg 125 mL/hr over 120 Minutes Intravenous Every 12 hours 05/05/24 0223 05/06/24 1038   05/04/24 1230  cefTRIAXone  (ROCEPHIN ) 2 g in sodium chloride  0.9 % 100 mL IVPB        2 g 200 mL/hr over 30 Minutes Intravenous  Once 05/04/24 1220 05/04/24 1335   05/04/24 1230  azithromycin  (ZITHROMAX ) tablet 500 mg  Status:  Discontinued        500 mg Oral Daily 05/04/24 1220 05/04/24 1947       Subjective: Patient seen and examined at bedside today.  Hemodynamically stable.  Comfortable.  Just came from cardioversion.  Has been on room air.  No shortness of breath or cough.  No abdomen pain, nausea or vomiting.  Objective: Vitals:   05/07/24 1023 05/07/24 1033 05/07/24 1043 05/07/24 1053  BP: 93/65 94/69 104/71 101/70  Pulse: 62 64 62 (!) 59  Resp: 17 18 18 19   Temp: 98.1 F (36.7 C)     TempSrc: Tympanic     SpO2: 94% 91% 90% 94%  Weight:      Height:        Intake/Output Summary (Last 24 hours) at 05/07/2024 1108 Last data filed at 05/07/2024 0612 Gross per 24 hour  Intake 550.06 ml  Output 1100 ml  Net -549.94 ml   Filed Weights   05/04/24 2321 05/06/24 0344 05/07/24 0500  Weight: 75.1 kg 74.8 kg 76.5 kg    Examination:   General exam: Overall comfortable, not in distress HEENT: PERRL Respiratory system:  no wheezes or crackles , mildly diminished breath sounds on bases Cardiovascular  system: S1 & S2 heard, RRR.  Gastrointestinal system: Abdomen is nondistended, soft and nontender.  Bowel sounds present Central nervous system: Alert and oriented Extremities: No edema, no clubbing ,no cyanosis Skin: No rashes, no ulcers,no icterus     Data Reviewed: I have personally reviewed following labs and imaging studies  CBC: Recent Labs  Lab 05/04/24 1027 05/05/24 0420 05/06/24 0451 05/07/24 0355  WBC 28.1* 14.9* 14.2* 12.3*  NEUTROABS 25.3*  --   --   --   HGB 16.5 13.7 14.3 12.6*  HCT 49.8 41.2 45.2 38.2*  MCV 90.9 92.0 95.6 92.9  PLT 139* 82* 104* 91*   Basic Metabolic Panel: Recent Labs  Lab 05/04/24 1027 05/05/24 0420 05/06/24 0451 05/07/24 0355  NA 140 140 139 136  K 3.9 4.1 4.2 4.2  CL 99 101 99 101  CO2 25 26 23 23   GLUCOSE 120* 91 131* 104*  BUN 20 28* 34* 33*  CREATININE 1.34* 1.49* 1.63* 1.34*  CALCIUM  9.9 8.5* 9.2 8.5*  MG  --  2.2  --   --      Recent Results (from the past 240 hours)  Resp panel by RT-PCR (RSV, Flu A&B, Covid) Anterior Nasal Swab     Status: None   Collection Time: 05/04/24 10:45 AM   Specimen: Anterior Nasal Swab  Result Value Ref Range Status   SARS Coronavirus 2 by RT PCR NEGATIVE NEGATIVE Final    Comment: (NOTE) SARS-CoV-2 target nucleic acids are NOT DETECTED.  The SARS-CoV-2 RNA is generally detectable in upper respiratory specimens during the acute phase of infection. The lowest concentration of SARS-CoV-2 viral copies this assay can detect is 138 copies/mL. A negative result does not preclude SARS-Cov-2 infection and should not be used as the sole basis for treatment or other patient management decisions. A negative result may occur with  improper specimen collection/handling, submission of specimen other than nasopharyngeal swab, presence of viral mutation(s) within the areas targeted by this assay, and inadequate number of viral copies(<138 copies/mL). A negative result must be combined with clinical  observations, patient history, and epidemiological information. The expected result is Negative.  Fact Sheet for Patients:  bloggercourse.com  Fact Sheet for Healthcare Providers:  seriousbroker.it  This test is no t yet approved or cleared by the [place]  FDA and  has been authorized for detection and/or diagnosis of SARS-CoV-2 by FDA under an Emergency Use Authorization (EUA). This EUA will remain  in effect (meaning this test can be used) for the duration of the COVID-19 declaration under Section 564(b)(1) of the Act, 21 U.S.C.section 360bbb-3(b)(1), unless the authorization is terminated  or revoked sooner.       Influenza A by PCR NEGATIVE NEGATIVE Final   Influenza B by PCR NEGATIVE NEGATIVE Final    Comment: (NOTE) The Xpert Xpress SARS-CoV-2/FLU/RSV plus assay is intended as an aid in the diagnosis of influenza from Nasopharyngeal swab specimens and should not be used as a sole basis for treatment. Nasal washings and aspirates are unacceptable for Xpert Xpress SARS-CoV-2/FLU/RSV testing.  Fact Sheet for Patients: bloggercourse.com  Fact Sheet for Healthcare Providers: seriousbroker.it  This test is not yet approved or cleared by the [place]  FDA and has been authorized for detection and/or diagnosis of SARS-CoV-2 by FDA under an Emergency Use Authorization (EUA). This EUA will remain in effect (meaning this test can be used) for the duration of the COVID-19 declaration under Section 564(b)(1) of the Act, 21 U.S.C. section 360bbb-3(b)(1), unless the authorization is terminated or revoked.     Resp Syncytial Virus by PCR NEGATIVE NEGATIVE Final    Comment: (NOTE) Fact Sheet for Patients: bloggercourse.com  Fact Sheet for Healthcare Providers: seriousbroker.it  This test is not yet approved or cleared by  the [place]  FDA and has been authorized for detection and/or diagnosis of SARS-CoV-2 by FDA under an Emergency Use Authorization (EUA). This EUA will remain in effect (meaning this test can be used) for the duration of the COVID-19 declaration under Section 564(b)(1) of the Act, 21 U.S.C. section 360bbb-3(b)(1), unless the authorization is terminated or revoked.  Performed at [facility], [address] [place], [place] [address]   Blood culture (routine x 2)     Status: None (Preliminary result)   Collection Time: 05/04/24 12:41 PM   Specimen: BLOOD RIGHT FOREARM  Result Value Ref Range Status   Specimen Description   Final    BLOOD RIGHT FOREARM Performed at [facility], [address], [place], [place] [address]    Special  Requests   Final    Blood Culture adequate volume BOTTLES DRAWN AEROBIC AND ANAEROBIC Performed at [facility], [address], [place], [place] [address]    Culture   Final    NO GROWTH 3 DAYS Performed at [facility], [address], [place], [place] [address]    Report Status PENDING  Incomplete  Blood culture (routine x 2)     Status: None (Preliminary result)   Collection Time: 05/04/24 12:46 PM   Specimen: BLOOD  Result Value Ref Range Status   Specimen Description   Final    BLOOD LEFT ANTECUBITAL Performed at [facility], [address], [place], [place] [address]    Special Requests   Final    Blood Culture adequate volume BOTTLES DRAWN AEROBIC AND ANAEROBIC Performed at [facility], [address], [place], [place] [address]    Culture   Final    NO GROWTH 3 DAYS Performed at [facility], [address], [place], [place] [address]    Report Status PENDING  Incomplete     Radiology Studies: EP STUDY Result Date: 05/07/2024 See surgical note for result.  DG Abd 1 View Result Date: 05/05/2024 CLINICAL DATA:  NG tube placement  EXAM: ABDOMEN - 1 VIEW COMPARISON:  05/05/2024, 05/04/2024 . FINDINGS: sternotomy and valve prosthesis. Streaky densities at the left base. Enteric tube tip and side-port overlie the stomach. Enteral contrast within the colon IMPRESSION: Enteric tube tip and side-port overlie the stomach. Electronically Signed   By: Luke Bun M.D.   On: 05/05/2024 21:02   DG Abd Portable 1V-Small Bowel Obstruction Protocol-initial, 8 hr delay Result Date: 05/05/2024 EXAM: 1 VIEW XRAY OF THE ABDOMEN 05/05/2024 07:00:00 PM COMPARISON: 05/04/2024 CLINICAL HISTORY: Small bowel obstruction FINDINGS: BOWEL: Nonobstructive bowel gas pattern with contrast extending from the cecum to the rectum. SOFT TISSUES: Hernia mesh overlying the lower pelvis bilaterally. BONES: No acute fracture. IMPRESSION: 1. Nonobstructive bowel gas pattern. Contrast extends from the cecum to the rectum. Electronically signed by: Pinkie Pebbles MD 05/05/2024 07:41 PM EST RP Workstation: [id number]    Scheduled Meds:  amiodarone   400 mg Oral BID   Followed by   NOREEN ON 05/13/2024] amiodarone   200 mg Oral BID   Followed by   NOREEN ON 05/20/2024] amiodarone   200 mg Oral Daily   doxycycline   100 mg Oral Q12H   levETIRAcetam   500 mg Oral Daily   levETIRAcetam   750 mg Oral QHS   predniSONE   20 mg Oral Q breakfast   warfarin  1 mg Oral q1600   Warfarin - Pharmacist Dosing Inpatient   Does not apply q1600   Continuous Infusions:  sodium chloride  75 mL/hr at 05/07/24 0415   cefTRIAXone  (ROCEPHIN )  IV 2 g (05/06/24 1347)     LOS: 3 days   Ivonne Mustache, MD [facility] P1/22/2026, 11:08 AM  "

## 2024-05-07 NOTE — Anesthesia Preprocedure Evaluation (Addendum)
 "                                  Anesthesia Evaluation  Patient identified by MRN, date of birth, ID band Patient awake    Reviewed: Allergy & Precautions, NPO status , Patient's Chart, lab work & pertinent test results  History of Anesthesia Complications Negative for: history of anesthetic complications  Airway Mallampati: III  TM Distance: >3 FB Neck ROM: Full    Dental  (+) Dental Advisory Given   Pulmonary neg shortness of breath, neg sleep apnea, neg COPD, neg recent URI, Current Smoker and Patient abstained from smoking.   breath sounds clear to auscultation       Cardiovascular +CHF  + dysrhythmias Atrial Fibrillation  Rhythm:Irregular     Neuro/Psych Seizures -,     GI/Hepatic   Endo/Other    Renal/GU CRFRenal diseaseLab Results      Component                Value               Date                      NA                       136                 05/07/2024                K                        4.2                 05/07/2024                CO2                      23                  05/07/2024                GLUCOSE                  104 (H)             05/07/2024                BUN                      33 (H)              05/07/2024                CREATININE               1.34 (H)            05/07/2024                CALCIUM                   8.5 (L)             05/07/2024                EGFR  54 (L)              05/03/2023                GFRNONAA                 55 (L)              05/07/2024                Musculoskeletal   Abdominal   Peds  Hematology  (+) anemia Lab Results      Component                Value               Date                      WBC                      12.3 (H)            05/07/2024                HGB                      12.6 (L)            05/07/2024                HCT                      38.2 (L)            05/07/2024                MCV                      92.9                 05/07/2024                PLT                      91 (L)              05/07/2024              Anesthesia Other Findings   Reproductive/Obstetrics                              Anesthesia Physical Anesthesia Plan  ASA: 3  Anesthesia Plan: General   Post-op Pain Management: Minimal or no pain anticipated   Induction: Intravenous  PONV Risk Score and Plan: 1 and Treatment may vary due to age or medical condition  Airway Management Planned: Nasal Cannula, Natural Airway and Simple Face Mask  Additional Equipment: None  Intra-op Plan:   Post-operative Plan:   Informed Consent: I have reviewed the patients History and Physical, chart, labs and discussed the procedure including the risks, benefits and alternatives for the proposed anesthesia with the patient or authorized representative who has indicated his/her understanding and acceptance.     Dental advisory given  Plan Discussed with: CRNA  Anesthesia Plan Comments:         Anesthesia Quick Evaluation  "

## 2024-05-07 NOTE — Interval H&P Note (Signed)
 History and Physical Interval Note:  05/07/2024 8:56 AM  Alan Willis  has presented today for surgery, with the diagnosis of afib.  The various methods of treatment have been discussed with the patient and family. After consideration of risks, benefits and other options for treatment, the patient has consented to  Procedures: CARDIOVERSION (N/A) as a surgical intervention.  The patient's history has been reviewed, patient examined, no change in status, stable for surgery.  I have reviewed the patient's chart and labs.  Questions were answered to the patient's satisfaction.     [name]

## 2024-05-07 NOTE — Significant Event (Signed)
 Informed Consent   Shared Decision Making/Informed Consent The risks (stroke, cardiac arrhythmias rarely resulting in the need for a temporary or permanent pacemaker, skin irritation or burns and complications associated with conscious sedation including aspiration, arrhythmia, respiratory failure and death), benefits (restoration of normal sinus rhythm) and alternatives of a direct current cardioversion were explained in detail to Alan Willis and he agrees to proceed.       Alan Gerbino T. Floretta HEATH, MD [place]  [facility]  05/07/2024 8:20 AM

## 2024-05-07 NOTE — Plan of Care (Signed)
" °  Problem: Education: Goal: Knowledge of General Education information will improve Description: Including pain rating scale, medication(s)/side effects and non-pharmacologic comfort measures Outcome: Progressing   Problem: Health Behavior/Discharge Planning: Goal: Ability to manage health-related needs will improve Outcome: Progressing   Problem: Clinical Measurements: Goal: Ability to maintain clinical measurements within normal limits will improve Outcome: Progressing Goal: Will remain free from infection Outcome: Progressing Goal: Diagnostic test results will improve Outcome: Progressing Goal: Respiratory complications will improve Outcome: Progressing Goal: Cardiovascular complication will be avoided Outcome: Progressing   Problem: Activity: Goal: Risk for activity intolerance will decrease Outcome: Progressing   Problem: Nutrition: Goal: Adequate nutrition will be maintained Outcome: Progressing   Problem: Coping: Goal: Level of anxiety will decrease Outcome: Progressing   Problem: Elimination: Goal: Will not experience complications related to bowel motility Outcome: Progressing Goal: Will not experience complications related to urinary retention Outcome: Progressing   Problem: Pain Managment: Goal: General experience of comfort will improve and/or be controlled Outcome: Progressing   Problem: Safety: Goal: Ability to remain free from injury will improve Outcome: Progressing   Problem: Skin Integrity: Goal: Risk for impaired skin integrity will decrease Outcome: Progressing   Problem: Education: Goal: Knowledge of disease or condition will improve Outcome: Progressing Goal: Understanding of medication regimen will improve Outcome: Progressing Goal: Individualized Educational Video(s) Outcome: Progressing   Problem: Activity: Goal: Ability to tolerate increased activity will improve Outcome: Progressing   Problem: Cardiac: Goal: Ability to achieve  and maintain adequate cardiopulmonary perfusion will improve Outcome: Progressing   Problem: Health Behavior/Discharge Planning: Goal: Ability to safely manage health-related needs after discharge will improve Outcome: Progressing   Problem: Education: Goal: Ability to demonstrate management of disease process will improve Outcome: Progressing Goal: Ability to verbalize understanding of medication therapies will improve Outcome: Progressing Goal: Individualized Educational Video(s) Outcome: Progressing   Problem: Activity: Goal: Capacity to carry out activities will improve Outcome: Progressing   Problem: Cardiac: Goal: Ability to achieve and maintain adequate cardiopulmonary perfusion will improve Outcome: Progressing   Problem: Activity: Goal: Ability to tolerate increased activity will improve Outcome: Progressing   Problem: Clinical Measurements: Goal: Ability to maintain a body temperature in the normal range will improve Outcome: Progressing   Problem: Respiratory: Goal: Ability to maintain adequate ventilation will improve Outcome: Progressing Goal: Ability to maintain a clear airway will improve Outcome: Progressing   "

## 2024-05-07 NOTE — Anesthesia Postprocedure Evaluation (Signed)
"   Anesthesia Post Note  Patient: Alan Willis  Procedure(s) Performed: CARDIOVERSION     Patient location during evaluation: Cath Lab Anesthesia Type: General Level of consciousness: awake and alert Pain management: pain level controlled Vital Signs Assessment: post-procedure vital signs reviewed and stable Respiratory status: spontaneous breathing, nonlabored ventilation and respiratory function stable Cardiovascular status: blood pressure returned to baseline and stable Postop Assessment: no apparent nausea or vomiting Anesthetic complications: no   There were no known notable events for this encounter.                Roxanne Orner      "

## 2024-05-07 NOTE — Telephone Encounter (Signed)
 Pt is in hospital. Wife was concerned if they discharge him tomorrow that they wont be able to get back if there is a storm. I told her that if needed to get back to call 911 and they will get him back to the hospital. Wife stated understanding.

## 2024-05-07 NOTE — Interval H&P Note (Signed)
 History and Physical Interval Note:  05/07/2024 9:21 AM  Alan Willis  has presented today for surgery, with the diagnosis of afib.  The various methods of treatment have been discussed with the patient and family. After consideration of risks, benefits and other options for treatment, the patient has consented to  Procedures: CARDIOVERSION (N/A) as a surgical intervention.  The patient's history has been reviewed, patient examined, no change in status, stable for surgery.  I have reviewed the patient's chart and labs.  Questions were answered to the patient's satisfaction.     [name]

## 2024-05-07 NOTE — Progress Notes (Signed)
 Mobility Specialist Progress Note;   05/07/24 1526  Mobility  Activity Ambulated with assistance  Level of Assistance Contact guard assist, steadying assist  Assistive Device Front wheel walker  Distance Ambulated (ft) 450 ft  Activity Response Tolerated well  Mobility Referral Yes  Mobility visit 1 Mobility  Mobility Specialist Start Time (ACUTE ONLY) 1526  Mobility Specialist Stop Time (ACUTE ONLY) 1538  Mobility Specialist Time Calculation (min) (ACUTE ONLY) 12 min   Patient received in bed, eager to participate in mobility. No physical assistance required, CGA for safety. Ambulated in hallways with steady gait. Returned to room, no c/o throughout. Pt Was left in bed with call bell and personal belongings in reach. Bed alarm on. All needs met.   Florine Oak Mobility Specialist Please Neurosurgeon or [employer] [phone]

## 2024-05-07 NOTE — Progress Notes (Signed)
 PHARMACY - ANTICOAGULATION CONSULT NOTE  Pharmacy Consult for warfarin Indication: mechAVR  Allergies[1]  Patient Measurements: Height: 5' 9 (175.3 cm) Weight: 76.5 kg (168 lb 11.2 oz) IBW/kg (Calculated) : 70.7 HEPARIN  DW (KG): 75.1  Vital Signs: Temp: 98.1 F (36.7 C) (01/22 1023) Temp Source: Tympanic (01/22 1023) BP: 101/70 (01/22 1053) Pulse Rate: 59 (01/22 1053)  Labs: Recent Labs    05/05/24 0420 05/06/24 0451 05/07/24 0355  HGB 13.7 14.3 12.6*  HCT 41.2 45.2 38.2*  PLT 82* 104* 91*  LABPROT 28.3* 28.3* 38.1*  INR 2.5* 2.5* 3.7*  CREATININE 1.49* 1.63* 1.34*    Estimated Creatinine Clearance: 46.2 mL/min (A) (by C-G formula based on SCr of 1.34 mg/dL (H)).   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 Warfarin was held for SBO and INR was 2.3-2.5 despite holding warfarin. Amiodarone  started 1/19 for afib. Pharmacy consulted for warfarin dosing.   Note new drug interaction with amiodarone . INR up to 3.7, which is supratherapeutic, after 1mg  warfarin in the last 3 days. Eating 100%. S/p DCCV.  Warfarin PTA 4mg  Tue/Thr, 2mg  all other days  Goal of Therapy:  INR 2.5 -3.5  Monitor platelets by anticoagulation protocol: Yes   Plan:  Hold warfarin until INR is near 2.5 - may need 0.5mg  just two to three times a week with new amiodarone  interaction  Monitor daily INR, signs/symptoms of bleeding   Jinnie Door, PharmD, BCPS,  BCCP Clinical Pharmacist  Please check AMION for all [facility] Pharmacy phone numbers After 10:00 PM, call Main Pharmacy [phone]      [1]  Allergies Allergen Reactions   Amoxicillin-Pot Clavulanate Rash   Codeine Other (See Comments)    Stomach upset

## 2024-05-08 ENCOUNTER — Telehealth: Payer: Self-pay | Admitting: Cardiology

## 2024-05-08 ENCOUNTER — Other Ambulatory Visit (HOSPITAL_COMMUNITY): Payer: Self-pay

## 2024-05-08 ENCOUNTER — Other Ambulatory Visit: Payer: Self-pay | Admitting: Cardiology

## 2024-05-08 DIAGNOSIS — I4819 Other persistent atrial fibrillation: Secondary | ICD-10-CM

## 2024-05-08 DIAGNOSIS — K56609 Unspecified intestinal obstruction, unspecified as to partial versus complete obstruction: Secondary | ICD-10-CM

## 2024-05-08 DIAGNOSIS — J188 Other pneumonia, unspecified organism: Secondary | ICD-10-CM | POA: Diagnosis not present

## 2024-05-08 LAB — BASIC METABOLIC PANEL WITH GFR
Anion gap: 9 (ref 5–15)
BUN: 32 mg/dL — ABNORMAL HIGH (ref 8–23)
CO2: 24 mmol/L (ref 22–32)
Calcium: 8.7 mg/dL — ABNORMAL LOW (ref 8.9–10.3)
Chloride: 104 mmol/L (ref 98–111)
Creatinine, Ser: 1.33 mg/dL — ABNORMAL HIGH (ref 0.61–1.24)
GFR, Estimated: 55 mL/min — ABNORMAL LOW
Glucose, Bld: 76 mg/dL (ref 70–99)
Potassium: 3.7 mmol/L (ref 3.5–5.1)
Sodium: 137 mmol/L (ref 135–145)

## 2024-05-08 LAB — CBC
HCT: 37.8 % — ABNORMAL LOW (ref 39.0–52.0)
Hemoglobin: 12.6 g/dL — ABNORMAL LOW (ref 13.0–17.0)
MCH: 30.5 pg (ref 26.0–34.0)
MCHC: 33.3 g/dL (ref 30.0–36.0)
MCV: 91.5 fL (ref 80.0–100.0)
Platelets: 77 K/uL — ABNORMAL LOW (ref 150–400)
RBC: 4.13 MIL/uL — ABNORMAL LOW (ref 4.22–5.81)
RDW: 17.8 % — ABNORMAL HIGH (ref 11.5–15.5)
WBC: 9.5 K/uL (ref 4.0–10.5)
nRBC: 0 % (ref 0.0–0.2)

## 2024-05-08 LAB — PROTIME-INR
INR: 3.9 — ABNORMAL HIGH (ref 0.8–1.2)
Prothrombin Time: 40.3 s — ABNORMAL HIGH (ref 11.4–15.2)

## 2024-05-08 MED ORDER — POLYETHYLENE GLYCOL 3350 17 G PO PACK
17.0000 g | PACK | Freq: Every day | ORAL | Status: DC
  Administered 2024-05-08: 17 g via ORAL
  Filled 2024-05-08: qty 1

## 2024-05-08 MED ORDER — AMIODARONE HCL 200 MG PO TABS
ORAL_TABLET | ORAL | 0 refills | Status: AC
  Filled 2024-05-08: qty 49, 42d supply, fill #0

## 2024-05-08 MED ORDER — AMIODARONE HCL 200 MG PO TABS: 100.0000 mg | ORAL_TABLET | Freq: Every day | ORAL | Status: DC

## 2024-05-08 MED ORDER — DOXYCYCLINE HYCLATE 100 MG PO TABS
100.0000 mg | ORAL_TABLET | Freq: Two times a day (BID) | ORAL | 0 refills | Status: AC
  Filled 2024-05-08: qty 2, 1d supply, fill #0

## 2024-05-08 MED ORDER — CEFUROXIME AXETIL 500 MG PO TABS
500.0000 mg | ORAL_TABLET | Freq: Two times a day (BID) | ORAL | 0 refills | Status: AC
  Filled 2024-05-08: qty 6, 3d supply, fill #0

## 2024-05-08 MED ORDER — BISACODYL 10 MG RE SUPP: 10.0000 mg | Freq: Once | RECTAL | Status: DC

## 2024-05-08 MED ORDER — AMIODARONE HCL 200 MG PO TABS: 200.0000 mg | ORAL_TABLET | Freq: Two times a day (BID) | ORAL | Status: DC

## 2024-05-08 MED ORDER — AMIODARONE HCL 200 MG PO TABS: 400.0000 mg | ORAL_TABLET | Freq: Two times a day (BID) | ORAL | Status: DC

## 2024-05-08 MED ORDER — CEFUROXIME AXETIL 250 MG PO TABS
500.0000 mg | ORAL_TABLET | Freq: Two times a day (BID) | ORAL | Status: DC
  Administered 2024-05-08: 500 mg via ORAL
  Filled 2024-05-08 (×2): qty 2

## 2024-05-08 MED ORDER — FUROSEMIDE 20 MG PO TABS
20.0000 mg | ORAL_TABLET | Freq: Every day | ORAL | Status: DC
  Administered 2024-05-08: 20 mg via ORAL
  Filled 2024-05-08: qty 1

## 2024-05-08 MED ORDER — POLYETHYLENE GLYCOL 3350 17 GM/SCOOP PO POWD
17.0000 g | Freq: Every day | ORAL | 0 refills | Status: AC
  Filled 2024-05-08: qty 238, 14d supply, fill #0

## 2024-05-08 MED ORDER — SENNOSIDES 8.8 MG/5ML PO SYRP
5.0000 mL | ORAL_SOLUTION | Freq: Two times a day (BID) | ORAL | Status: DC
  Filled 2024-05-08: qty 5

## 2024-05-08 NOTE — TOC Transition Note (Signed)
 Transition of Care [facility]) - Discharge Note   Patient Details  Name: Alan Willis MRN: [id number] Date of Birth: Dec 10, 1945  Transition of Care [facility] CM/SW Contact:  Sudie Erminio Deems, RN Phone Number: 05/08/2024, 11:04 AM   Clinical Narrative:  Patient will discharge home today. No home needs identified at this time. Spouse will transport home via private vehicle.    Final next level of care: Home/Self Care Barriers to Discharge: No Barriers Identified  Discharge Plan and Services Additional resources added to the After Visit Summary for   In-house Referral: NA Discharge Planning Services: CM Consult Post Acute Care Choice: Home Health            DME Agency: NA  Social Drivers of Health (SDOH) Interventions SDOH Screenings   Food Insecurity: No Food Insecurity (05/04/2024)  Housing: Low Risk (05/04/2024)  Transportation Needs: No Transportation Needs (05/04/2024)  Utilities: Not At Risk (05/04/2024)  Depression (PHQ2-9): Low Risk (11/06/2023)  Social Connections: Socially Integrated (05/04/2024)  Tobacco Use: High Risk (05/04/2024)     Readmission Risk Interventions    03/13/2023    6:32 PM 03/05/2023   12:52 PM  Readmission Risk Prevention Plan  Transportation Screening Complete Complete  PCP or Specialist Appt within 5-7 Days Complete   PCP or Specialist Appt within 3-5 Days  Complete  Home Care Screening Complete   Medication Review (RN CM) Complete   HRI or Home Care Consult  Complete  Social Work Consult for Recovery Care Planning/Counseling  Complete  Palliative Care Screening  Not Applicable  Medication Review Ocean[employer])  Complete

## 2024-05-08 NOTE — Discharge Summary (Signed)
 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  On presentation he was in a flutter with rate in the range of 140s, stable blood pressure, fever around 1.2.  WBC count 28,000, creatinine of 1.3, proBNP of 4570, lactic acid was 2.  CTA chest negative for PE but showed right upper and left lower lobe pneumonia.  CT abdomen/pelvis showed high-grade small bowel obstruction with transition point in the lower ventral midline abdomen.  General surgery, cardiology were consulted.  Started on antibiotics for pneumonia.  Started on IV amiodarone .  Underwent cardioversion , now in normal sinus rhythm.  Amiodarone  changed to oral.  He did very well with the physical therapist, no follow-up recommended.  Remains on room air.  Finally had a bowel movement and tolerating diet.  Medically stable for discharge.   Following problems were addressed during the hospitalization:  Sepsis secondary to multifocal pneumonia/acute hypoxic respiratory failure: Presented with fever, tachycardia, tachypnea, leukocytosis.  Hypoxic on presentation and was put on high flow  oxygen.  Currently on room air.  Lactic acid was mildly elevated.  Not hypotensive.  CT chest showed right upper/left lower lobe pneumonia.  COVID/flu/RSV negative.  This could also be from aspiration from small bowel obstruction.  Respiratory status has significantly improved.   Antibiotics changed to oral.   SBO: Presented with abdominal pain.CT abdomen/pelvis showed high-grade small bowel obstruction with transition point in the lower ventral midline abdomen.  General surgery were following.    Abdomen is soft, nontender, nondistended today.  Passing gas. NG tube removed.  Tolerating  diet.  Had a bowel movement   A-fib with RVR: Takes warfarin at home.  Started on amiodarone  drip.Underwent cardioversion on 1/22.  Now on oral amiodarone .  Warfarin held because INR is 3.7.  We have instructed him to check INR on Monday before resuming warfarin   Acute on chronic HFmrEF: Last echo in July 2025 showed EF of 40 to 45%, global hypokinesis, right ventricular systolic function mildly reduced, moderate elevated pulmonary artery systolic pressure, severe biatrial dilatation.  Elevated BNP of 4570.  Takes Lasix  20 mg daily at home.  Being continued.   Appears euvolemic today.   AKI on CKD stage IIIa: baseline creatinine around 1.3.  Creatinine slightly trending up to the range of 1.6.  Given gentle IV fluids, improved   History of moderate to severe mitral regurgitation/stenosis, mechanical aortic valve replacement: Follows up with cardiology.  Takes warfarin.  Goal INR 2.5-3   Seizure disorder: Continue Keppra    Chronic thrombocytopenia: Follows with hematology.  On prolonged prednisone  taper.  Currently platelets stable.  Currently on prednisone  20 mg daily.  Leukocytosis: This is most likely secondary to steroids.  Continue to monitor, stable   Deconditioning: PT saw him on 1/21, no follow-up recommended.       Discharge Diagnoses:  Principal Problem:   Multifocal pneumonia Active Problems:   Atrial flutter with rapid ventricular response (HCC)   Atrial fibrillation with rapid ventricular response (HCC)   Acute exacerbation of CHF (congestive heart failure) (HCC)  Acute respiratory failure with hypoxia (HCC)   Sepsis [facility])    Discharge  Instructions  Discharge Instructions     Discharge instructions   Complete by: As directed    1)Please take your medications as instructed 2)Follow up with your PCP in a week 3)You will be called by cardiology for follow-up appointment 4)Follow up with your hematologist.  Take prednisone  as instructed   Increase activity slowly   Complete by: As directed       Allergies as of 05/08/2024       Reactions   Amoxicillin-pot Clavulanate Rash   Codeine Other (See Comments)   Stomach upset        Medication List     PAUSE taking these medications    warfarin 2 MG tablet Wait to take this until: May 11, 2024 Commonly known as: COUMADIN  Take 2-4 mg by mouth daily. Take 4mg  Tue/Thr, 2mg  all other days OR as directed       TAKE these medications    amiodarone  100 MG tablet Commonly known as: PACERONE  Take 4 tablets (400 mg total) by mouth 2 (two) times daily for 5 days, THEN 2 tablets (200 mg total) 2 (two) times daily for 7 days, THEN 1 tablet (100 mg total) daily. Start taking on: May 08, 2024 What changed:  medication strength See the new instructions.   cefUROXime 500 MG tablet Commonly known as: CEFTIN Take 1 tablet (500 mg total) by mouth 2 (two) times daily for 3 days.   doxycycline  100 MG tablet Commonly known as: VIBRA -TABS Take 1 tablet (100 mg total) by mouth every 12 (twelve) hours for 1 day.   empagliflozin  10 MG Tabs tablet Commonly known as: JARDIANCE  Take 1 tablet (10 mg total) by mouth daily.   ferrous sulfate 325 (65 FE) MG tablet Take 325 mg by mouth daily with breakfast.   furosemide  20 MG tablet Commonly known as: LASIX  Take 20 mg by mouth daily.   Imodium A-D 2 MG tablet Generic drug: loperamide Take 2 mg by mouth 4 (four) times daily as needed for diarrhea or loose stools.   levETIRAcetam  500 MG tablet Commonly known as: Keppra  Take 1 tablet in AM and 1 and 1/2 tablets in PM   magnesium  oxide 400 (240 Mg) MG  tablet Commonly known as: MAG-OX Take 400 mg by mouth daily.   ondansetron  4 MG tablet Commonly known as: ZOFRAN  Take 4 mg by mouth every 8 (eight) hours as needed.   polyethylene glycol 17 g packet Commonly known as: MIRALAX / GLYCOLAX Take 17 g by mouth daily. Start taking on: May 09, 2024   predniSONE  20 MG tablet Commonly known as: DELTASONE  TAKE 3 TABLETS (60 MG TOTAL) BY MOUTH DAILY WITH BREAKFAST.   rosuvastatin  20 MG tablet Commonly known as: CRESTOR  Take 1 tablet (20 mg total) by mouth daily.   TYLENOL  500 MG tablet Generic drug: acetaminophen  Take 500 mg by mouth every 6 (six) hours as needed for mild pain (pain score 1-3) or headache.   Vitamin D3 1000 units Caps Take 1,000 Units by mouth daily.        Follow-up Information     Cleotilde Planas, MD. Schedule an appointment as soon as possible for a visit in 1 week(s).   Specialty: Family Medicine Contact information: [address] [place] [address] [phone]                Allergies[1]  Consultations: Cardiology, general surgery   Procedures/Studies: EP STUDY  Result Date: 05/07/2024 See surgical note for result.  DG Abd 1 View Result Date: 05/05/2024 CLINICAL DATA:  NG tube placement EXAM: ABDOMEN - 1 VIEW COMPARISON:  05/05/2024, 05/04/2024 . FINDINGS: sternotomy and valve prosthesis. Streaky densities at the left base. Enteric tube tip and side-port overlie the stomach. Enteral contrast within the colon IMPRESSION: Enteric tube tip and side-port overlie the stomach. Electronically Signed   By: Luke Bun M.D.   On: 05/05/2024 21:02   DG Abd Portable 1V-Small Bowel Obstruction Protocol-initial, 8 hr delay Result Date: 05/05/2024 EXAM: 1 VIEW XRAY OF THE ABDOMEN 05/05/2024 07:00:00 PM COMPARISON: 05/04/2024 CLINICAL HISTORY: Small bowel obstruction FINDINGS: BOWEL: Nonobstructive bowel gas pattern with contrast extending from the cecum to the rectum. SOFT TISSUES: Hernia mesh  overlying the lower pelvis bilaterally. BONES: No acute fracture. IMPRESSION: 1. Nonobstructive bowel gas pattern. Contrast extends from the cecum to the rectum. Electronically signed by: Pinkie Pebbles MD 05/05/2024 07:41 PM EST RP Workstation: [id number]   DG Abdomen 1 View Result Date: 05/04/2024 EXAM: 1 VIEW XRAY OF THE ABDOMEN 05/04/2024 02:18:47 PM COMPARISON: CT abdomen and pelvis 05/04/2024. CLINICAL HISTORY: confirm NG FINDINGS: LINES, TUBES AND DEVICES: Enteric tube in place below the diaphragm with tip in the mid stomach. BOWEL: Dilated small bowel loops in visualized upper abdomen. SOFT TISSUES: No abnormal calcifications. BONES: No acute fracture. LUNGS: Patchy airspace opacity in the right mid lung. IMPRESSION: 1. Enteric tube tip terminates in the mid stomach. 2. Dilated small bowel loops in the upper abdomen. 3. Patchy right mid lung airspace opacity. Electronically signed by: Greig Pique MD 05/04/2024 03:17 PM EST RP Workstation: [id number]   CT Angio Chest PE W and/or Wo Contrast Result Date: 05/04/2024 CLINICAL DATA:  Pulmonary embolism suspected, high probability. Abdominal pain, nausea, difficulty breathing, cough. Leg and ankle swelling. Low O2 sat. EXAM: CT ANGIOGRAPHY CHEST CT ABDOMEN AND PELVIS WITH CONTRAST TECHNIQUE: Multidetector CT imaging of the chest was performed using the standard protocol during bolus administration of intravenous contrast. Multiplanar CT image reconstructions and MIPs were obtained to evaluate the vascular anatomy. Multidetector CT imaging of the abdomen and pelvis was performed using the standard protocol during bolus administration of intravenous contrast. RADIATION DOSE REDUCTION: This exam was performed according to the departmental dose-optimization program which includes automated exposure control, adjustment of the mA and/or kV according to patient size and/or use of iterative reconstruction technique. CONTRAST:  OMNIPAQUE  IOHEXOL  350 MG/ML  SOLN COMPARISON:  CT chest 11/05/2023 and CT abdomen pelvis 01/02/2024. FINDINGS: CTA CHEST FINDINGS Cardiovascular: Image quality is degraded by respiratory motion. Negative for pulmonary embolus. Atherosclerotic calcification of the aorta and coronary arteries. Aortic valve replacement. Mitral annulus calcification. Enlarged pulmonic trunk and heart. No pericardial effusion. Ascending aorta measures 3.8 cm (7/89). Mediastinum/Nodes: Thoracic inlet lymph nodes are not enlarged by CT size criteria. Mediastinal lymph nodes measure up to 1.7 cm in the low right paratracheal station, minimally larger but present dating back to 04/14/2020. 1.6 cm right hilar lymph node. No left hilar or axillary adenopathy. Air in fluid in the esophagus are indicative of dysmotility. Lungs/Pleura: Image quality is degraded by expiratory phase imaging, creating added density in the lungs. Patchy rounded consolidation in the right upper lobe. Additional rounded areas of consolidation in the left lower lobe. Nodule in the inferior posterolateral left lower lobe is seen along the left hemidiaphragm and measures 1.3 x 1.6 cm (6/100), unchanged from 04/14/2020 and considered benign. No pleural fluid. Airway is unremarkable. Musculoskeletal: Degenerative changes in the  spine. Chronic mild compression of the T4 and T5 superior endplates. Review of the MIP images confirms the above findings. CT ABDOMEN and PELVIS FINDINGS Hepatobiliary: Liver is enlarged, 19.9 cm. Gallstone. No biliary ductal dilatation. Pancreas: Negative. Spleen: Enlarged, 15.2 cm. Adrenals/Urinary Tract: Adrenal glands are unremarkable. Low-attenuation lesions in the kidneys. No specific follow-up necessary. Kidneys are otherwise unremarkable. Ureters are decompressed. Bladder is grossly unremarkable. Stomach/Bowel: Stomach is distended with fluid. Dilated and fluid-filled jejunum with a transition point in the ventral midline low abdomen (2/51). Remainder of the small bowel  is acutely decompressed. There is unobstructed small bowel extending into a right inguinal hernia. Appendix and colon are unremarkable. Vascular/Lymphatic: Atherosclerotic calcification of the aorta. No pathologically enlarged lymph nodes. Reproductive: Prostate is slightly enlarged. Other: Bilateral inguinal hernia repair. Large right inguinal hernia contains unobstructed small bowel. Small left inguinal hernia contains fat. Trace interloop fluid. No mesenteric edema. Musculoskeletal: Degenerative changes in the spine. Review of the MIP images confirms the above findings. IMPRESSION: 1. Negative for pulmonary embolus. 2. High-grade small bowel obstruction with transition point in the low ventral midline abdomen. Minimal associated interloop fluid. No mesenteric edema. 3. Right upper and left lower lobe pneumonia. Associated enlargement of mediastinal lymph nodes, likely reactive in etiology. 4. Bilateral inguinal hernia repair with a large right inguinal hernia containing unobstructed small bowel and a small left inguinal hernia contains fat. 5. Mild hepatosplenomegaly, improved from 01/02/2024. 6. Cholelithiasis. 7. Mildly enlarged prostate. 8. Aortic atherosclerosis (ICD10-I70.0). Coronary artery calcification. 9. Enlarged pulmonic trunk, indicative of pulmonary arterial hypertension. Electronically Signed   By: Newell Eke M.D.   On: 05/04/2024 13:29   CT ABDOMEN PELVIS W CONTRAST Result Date: 05/04/2024 CLINICAL DATA:  Pulmonary embolism suspected, high probability. Abdominal pain, nausea, difficulty breathing, cough. Leg and ankle swelling. Low O2 sat. EXAM: CT ANGIOGRAPHY CHEST CT ABDOMEN AND PELVIS WITH CONTRAST TECHNIQUE: Multidetector CT imaging of the chest was performed using the standard protocol during bolus administration of intravenous contrast. Multiplanar CT image reconstructions and MIPs were obtained to evaluate the vascular anatomy. Multidetector CT imaging of the abdomen and pelvis was  performed using the standard protocol during bolus administration of intravenous contrast. RADIATION DOSE REDUCTION: This exam was performed according to the departmental dose-optimization program which includes automated exposure control, adjustment of the mA and/or kV according to patient size and/or use of iterative reconstruction technique. CONTRAST:  OMNIPAQUE  IOHEXOL  350 MG/ML SOLN COMPARISON:  CT chest 11/05/2023 and CT abdomen pelvis 01/02/2024. FINDINGS: CTA CHEST FINDINGS Cardiovascular: Image quality is degraded by respiratory motion. Negative for pulmonary embolus. Atherosclerotic calcification of the aorta and coronary arteries. Aortic valve replacement. Mitral annulus calcification. Enlarged pulmonic trunk and heart. No pericardial effusion. Ascending aorta measures 3.8 cm (7/89). Mediastinum/Nodes: Thoracic inlet lymph nodes are not enlarged by CT size criteria. Mediastinal lymph nodes measure up to 1.7 cm in the low right paratracheal station, minimally larger but present dating back to 04/14/2020. 1.6 cm right hilar lymph node. No left hilar or axillary adenopathy. Air in fluid in the esophagus are indicative of dysmotility. Lungs/Pleura: Image quality is degraded by expiratory phase imaging, creating added density in the lungs. Patchy rounded consolidation in the right upper lobe. Additional rounded areas of consolidation in the left lower lobe. Nodule in the inferior posterolateral left lower lobe is seen along the left hemidiaphragm and measures 1.3 x 1.6 cm (6/100), unchanged from 04/14/2020 and considered benign. No pleural fluid. Airway is unremarkable. Musculoskeletal: Degenerative changes in the spine. Chronic mild  compression of the T4 and T5 superior endplates. Review of the MIP images confirms the above findings. CT ABDOMEN and PELVIS FINDINGS Hepatobiliary: Liver is enlarged, 19.9 cm. Gallstone. No biliary ductal dilatation. Pancreas: Negative. Spleen: Enlarged, 15.2 cm.  Adrenals/Urinary Tract: Adrenal glands are unremarkable. Low-attenuation lesions in the kidneys. No specific follow-up necessary. Kidneys are otherwise unremarkable. Ureters are decompressed. Bladder is grossly unremarkable. Stomach/Bowel: Stomach is distended with fluid. Dilated and fluid-filled jejunum with a transition point in the ventral midline low abdomen (2/51). Remainder of the small bowel is acutely decompressed. There is unobstructed small bowel extending into a right inguinal hernia. Appendix and colon are unremarkable. Vascular/Lymphatic: Atherosclerotic calcification of the aorta. No pathologically enlarged lymph nodes. Reproductive: Prostate is slightly enlarged. Other: Bilateral inguinal hernia repair. Large right inguinal hernia contains unobstructed small bowel. Small left inguinal hernia contains fat. Trace interloop fluid. No mesenteric edema. Musculoskeletal: Degenerative changes in the spine. Review of the MIP images confirms the above findings. IMPRESSION: 1. Negative for pulmonary embolus. 2. High-grade small bowel obstruction with transition point in the low ventral midline abdomen. Minimal associated interloop fluid. No mesenteric edema. 3. Right upper and left lower lobe pneumonia. Associated enlargement of mediastinal lymph nodes, likely reactive in etiology. 4. Bilateral inguinal hernia repair with a large right inguinal hernia containing unobstructed small bowel and a small left inguinal hernia contains fat. 5. Mild hepatosplenomegaly, improved from 01/02/2024. 6. Cholelithiasis. 7. Mildly enlarged prostate. 8. Aortic atherosclerosis (ICD10-I70.0). Coronary artery calcification. 9. Enlarged pulmonic trunk, indicative of pulmonary arterial hypertension. Electronically Signed   By: Newell Eke M.D.   On: 05/04/2024 13:29   US  Venous Img Lower Bilateral Result Date: 05/04/2024 EXAM: ULTRASOUND DUPLEX OF THE BILATERAL LOWER EXTREMITY VEINS TECHNIQUE: Duplex ultrasound using  B-mode/gray scaled imaging and Doppler spectral analysis and color flow was obtained of the deep venous structures of the bilateral lower extremity. COMPARISON: None available. CLINICAL HISTORY: leg swelling FINDINGS: LEFT: The common femoral vein, femoral vein, popliteal vein, and posterior tibial vein demonstrate normal compressibility with normal color flow and spectral analysis. RIGHT: The common femoral vein, femoral vein, popliteal vein, and posterior tibial vein demonstrate normal compressibility with normal color flow and spectral analysis. IMPRESSION: 1. No evidence of deep venous thrombosis. Electronically signed by: Evalene Coho MD 05/04/2024 12:21 PM EST RP Workstation: [id number]   DG Chest Port 1 View Result Date: 05/04/2024 CLINICAL DATA:  Abdominal pain and nausea with difficulty breathing and mild cough for few days. EXAM: PORTABLE CHEST 1 VIEW COMPARISON:  None Available. FINDINGS: Multiple sternal wires are present. The cardiac silhouette is mildly enlarged and unchanged in size. An artificial cardiac valve is seen. Mild to moderate severity infiltrate is seen within the periphery of the mid right lung. Mild left basilar atelectasis and/or infiltrate is also present. No pleural effusion or pneumothorax is identified. Multilevel degenerative changes seen throughout the thoracic spine. IMPRESSION: 1. Mild to moderate severity right mid lung infiltrate. 2. Mild left basilar atelectasis and/or infiltrate. Electronically Signed   By: Suzen Dials M.D.   On: 05/04/2024 11:32      Subjective: Patient seen and examined at bedside today.  Hemodynamically stable.  Very comfortable this morning.  Remains in normal sinus rhythm.  On room air.  Tolerating diet, had a bowel movement.  Very eager to go home today.  Discharge planning discussed with wife at bedside.  Discharge Exam: Vitals:   05/08/24 0000 05/08/24 0845  BP: 111/76 122/85  Pulse: 63 65  Resp: 13 12  Temp: 97.7 F (36.5  C) 97.8 F (36.6 C)  SpO2: 92% 95%   Vitals:   05/07/24 1946 05/08/24 0000 05/08/24 0347 05/08/24 0845  BP: 106/73 111/76  122/85  Pulse: (!) 59 63  65  Resp: (!) 21 13  12   Temp: 98 F (36.7 C) 97.7 F (36.5 C)  97.8 F (36.6 C)  TempSrc: Oral Oral  Oral  SpO2:  92%  95%  Weight:   77.7 kg   Height:        General: Pt is alert, awake, not in acute distress Cardiovascular: RRR, S1/S2 +, no rubs, no gallops Respiratory: CTA bilaterally, no wheezing, no rhonchi Abdominal: Soft, NT, ND, bowel sounds + Extremities: no edema, no cyanosis    The results of significant diagnostics from this hospitalization (including imaging, microbiology, ancillary and laboratory) are listed below for reference.     Microbiology: Recent Results (from the past 240 hours)  Resp panel by RT-PCR (RSV, Flu A&B, Covid) Anterior Nasal Swab     Status: None   Collection Time: 05/04/24 10:45 AM   Specimen: Anterior Nasal Swab  Result Value Ref Range Status   SARS Coronavirus 2 by RT PCR NEGATIVE NEGATIVE Final    Comment: (NOTE) SARS-CoV-2 target nucleic acids are NOT DETECTED.  The SARS-CoV-2 RNA is generally detectable in upper respiratory specimens during the acute phase of infection. The lowest concentration of SARS-CoV-2 viral copies this assay can detect is 138 copies/mL. A negative result does not preclude SARS-Cov-2 infection and should not be used as the sole basis for treatment or other patient management decisions. A negative result may occur with  improper specimen collection/handling, submission of specimen other than nasopharyngeal swab, presence of viral mutation(s) within the areas targeted by this assay, and inadequate number of viral copies(<138 copies/mL). A negative result must be combined with clinical observations, patient history, and epidemiological information. The expected result is Negative.  Fact Sheet for Patients:  bloggercourse.com  Fact  Sheet for Healthcare Providers:  seriousbroker.it  This test is no t yet approved or cleared by the [place]  FDA and  has been authorized for detection and/or diagnosis of SARS-CoV-2 by FDA under an Emergency Use Authorization (EUA). This EUA will remain  in effect (meaning this test can be used) for the duration of the COVID-19 declaration under Section 564(b)(1) of the Act, 21 U.S.C.section 360bbb-3(b)(1), unless the authorization is terminated  or revoked sooner.       Influenza A by PCR NEGATIVE NEGATIVE Final   Influenza B by PCR NEGATIVE NEGATIVE Final    Comment: (NOTE) The Xpert Xpress SARS-CoV-2/FLU/RSV plus assay is intended as an aid in the diagnosis of influenza from Nasopharyngeal swab specimens and should not be used as a sole basis for treatment. Nasal washings and aspirates are unacceptable for Xpert Xpress SARS-CoV-2/FLU/RSV testing.  Fact Sheet for Patients: bloggercourse.com  Fact Sheet for Healthcare Providers: seriousbroker.it  This test is not yet approved or cleared by the [place]  FDA and has been authorized for detection and/or diagnosis of SARS-CoV-2 by FDA under an Emergency Use Authorization (EUA). This EUA will remain in effect (meaning this test can be used) for the duration of the COVID-19 declaration under Section 564(b)(1) of the Act, 21 U.S.C. section 360bbb-3(b)(1), unless the authorization is terminated or revoked.     Resp Syncytial Virus by PCR NEGATIVE NEGATIVE Final    Comment: (NOTE) Fact Sheet for Patients: bloggercourse.com  Fact Sheet for Healthcare Providers: seriousbroker.it  This test is  not yet approved or cleared by the [place]  FDA and has been authorized for detection and/or diagnosis of SARS-CoV-2 by FDA under an Emergency Use Authorization (EUA). This EUA will remain in effect  (meaning this test can be used) for the duration of the COVID-19 declaration under Section 564(b)(1) of the Act, 21 U.S.C. section 360bbb-3(b)(1), unless the authorization is terminated or revoked.  Performed at [facility], [address] [place], [place] [address]   Blood culture (routine x 2)     Status: None (Preliminary result)   Collection Time: 05/04/24 12:41 PM   Specimen: BLOOD RIGHT FOREARM  Result Value Ref Range Status   Specimen Description   Final    BLOOD RIGHT FOREARM Performed at [facility], [address] [place], [place] [address]    Special Requests   Final    Blood Culture adequate volume BOTTLES DRAWN AEROBIC AND ANAEROBIC Performed at [facility], [address], [place], [place] [address]    Culture   Final    NO GROWTH 4 DAYS Performed at [facility], [address], [place], [place] [address]    Report Status PENDING  Incomplete  Blood culture (routine x 2)     Status: None (Preliminary result)   Collection Time: 05/04/24 12:46 PM   Specimen: BLOOD  Result Value Ref Range Status   Specimen Description   Final    BLOOD LEFT ANTECUBITAL Performed at [facility], [address], [place], [place] [address]    Special Requests   Final    Blood Culture adequate volume BOTTLES DRAWN AEROBIC AND ANAEROBIC Performed at [facility], [address], [place], [place] [address]    Culture   Final    NO GROWTH 4 DAYS Performed at [facility], [address], [place], [place] [address]    Report Status PENDING  Incomplete     Labs: BNP (last 3 results) No results for input(s): BNP in the last 8760 hours. Basic Metabolic Panel: Recent Labs  Lab 05/04/24 1027 05/05/24 0420 05/06/24 0451 05/07/24 0355 05/08/24 0519  NA 140 140 139 136 137  K 3.9 4.1 4.2 4.2 3.7  CL 99 101 99 101 104  CO2 25 26 23 23 24   GLUCOSE 120* 91 131* 104* 76  BUN 20  28* 34* 33* 32*  CREATININE 1.34* 1.49* 1.63* 1.34* 1.33*  CALCIUM  9.9 8.5* 9.2 8.5* 8.7*  MG  --  2.2  --   --   --    Liver Function Tests: Recent Labs  Lab 05/05/24 0420  AST 22  ALT 28  ALKPHOS 75  BILITOT 2.2*  PROT 5.9*  ALBUMIN  3.5   No results for input(s): LIPASE, AMYLASE in the last 168 hours. No results for input(s): AMMONIA in the last 168 hours. CBC: Recent Labs  Lab 05/04/24 1027 05/05/24 0420 05/06/24 0451 05/07/24 0355 05/08/24 0519  WBC 28.1* 14.9* 14.2* 12.3* 9.5  NEUTROABS 25.3*  --   --   --   --   HGB 16.5 13.7 14.3 12.6* 12.6*  HCT 49.8 41.2 45.2 38.2* 37.8*  MCV 90.9 92.0 95.6 92.9 91.5  PLT 139* 82* 104* 91* 77*   Cardiac Enzymes: No results for input(s): CKTOTAL, CKMB, CKMBINDEX, TROPONINI in the last 168 hours. BNP: Invalid input(s): POCBNP CBG: No results for input(s): GLUCAP in the last 168 hours. D-Dimer No results for input(s): DDIMER in the last 72 hours. Hgb A1c No results  for input(s): HGBA1C in the last 72 hours. Lipid Profile No results for input(s): CHOL, HDL, LDLCALC, TRIG, CHOLHDL, LDLDIRECT in the last 72 hours. Thyroid  function studies No results for input(s): TSH, T4TOTAL, T3FREE, THYROIDAB in the last 72 hours.  Invalid input(s): FREET3 Anemia work up No results for input(s): VITAMINB12, FOLATE, FERRITIN, TIBC, IRON, RETICCTPCT in the last 72 hours. Urinalysis    Component Value Date/Time   COLORURINE YELLOW 05/04/2024 1120   APPEARANCEUR CLEAR 05/04/2024 1120   LABSPEC 1.026 05/04/2024 1120   PHURINE 6.0 05/04/2024 1120   GLUCOSEU >1,000 (A) 05/04/2024 1120   HGBUR NEGATIVE 05/04/2024 1120   BILIRUBINUR NEGATIVE 05/04/2024 1120   KETONESUR NEGATIVE 05/04/2024 1120   PROTEINUR TRACE (A) 05/04/2024 1120   UROBILINOGEN 0.2 12/28/2009 1126   NITRITE NEGATIVE 05/04/2024 1120   LEUKOCYTESUR NEGATIVE 05/04/2024 1120   Sepsis Labs Recent Labs  Lab  05/05/24 0420 05/06/24 0451 05/07/24 0355 05/08/24 0519  WBC 14.9* 14.2* 12.3* 9.5   Microbiology Recent Results (from the past 240 hours)  Resp panel by RT-PCR (RSV, Flu A&B, Covid) Anterior Nasal Swab     Status: None   Collection Time: 05/04/24 10:45 AM   Specimen: Anterior Nasal Swab  Result Value Ref Range Status   SARS Coronavirus 2 by RT PCR NEGATIVE NEGATIVE Final    Comment: (NOTE) SARS-CoV-2 target nucleic acids are NOT DETECTED.  The SARS-CoV-2 RNA is generally detectable in upper respiratory specimens during the acute phase of infection. The lowest concentration of SARS-CoV-2 viral copies this assay can detect is 138 copies/mL. A negative result does not preclude SARS-Cov-2 infection and should not be used as the sole basis for treatment or other patient management decisions. A negative result may occur with  improper specimen collection/handling, submission of specimen other than nasopharyngeal swab, presence of viral mutation(s) within the areas targeted by this assay, and inadequate number of viral copies(<138 copies/mL). A negative result must be combined with clinical observations, patient history, and epidemiological information. The expected result is Negative.  Fact Sheet for Patients:  bloggercourse.com  Fact Sheet for Healthcare Providers:  seriousbroker.it  This test is no t yet approved or cleared by the [place]  FDA and  has been authorized for detection and/or diagnosis of SARS-CoV-2 by FDA under an Emergency Use Authorization (EUA). This EUA will remain  in effect (meaning this test can be used) for the duration of the COVID-19 declaration under Section 564(b)(1) of the Act, 21 U.S.C.section 360bbb-3(b)(1), unless the authorization is terminated  or revoked sooner.       Influenza A by PCR NEGATIVE NEGATIVE Final   Influenza B by PCR NEGATIVE NEGATIVE Final    Comment: (NOTE) The Xpert  Xpress SARS-CoV-2/FLU/RSV plus assay is intended as an aid in the diagnosis of influenza from Nasopharyngeal swab specimens and should not be used as a sole basis for treatment. Nasal washings and aspirates are unacceptable for Xpert Xpress SARS-CoV-2/FLU/RSV testing.  Fact Sheet for Patients: bloggercourse.com  Fact Sheet for Healthcare Providers: seriousbroker.it  This test is not yet approved or cleared by the [place]  FDA and has been authorized for detection and/or diagnosis of SARS-CoV-2 by FDA under an Emergency Use Authorization (EUA). This EUA will remain in effect (meaning this test can be used) for the duration of the COVID-19 declaration under Section 564(b)(1) of the Act, 21 U.S.C. section 360bbb-3(b)(1), unless the authorization is terminated or revoked.     Resp Syncytial Virus by PCR NEGATIVE NEGATIVE Final    Comment: (  NOTE) Fact Sheet for Patients: bloggercourse.com  Fact Sheet for Healthcare Providers: seriousbroker.it  This test is not yet approved or cleared by the [place]  FDA and has been authorized for detection and/or diagnosis of SARS-CoV-2 by FDA under an Emergency Use Authorization (EUA). This EUA will remain in effect (meaning this test can be used) for the duration of the COVID-19 declaration under Section 564(b)(1) of the Act, 21 U.S.C. section 360bbb-3(b)(1), unless the authorization is terminated or revoked.  Performed at [facility], [address], [place], [place] [address]   Blood culture (routine x 2)     Status: None (Preliminary result)   Collection Time: 05/04/24 12:41 PM   Specimen: BLOOD RIGHT FOREARM  Result Value Ref Range Status   Specimen Description   Final    BLOOD RIGHT FOREARM Performed at [facility], [address], [place], [place] [address]    Special Requests   Final    Blood  Culture adequate volume BOTTLES DRAWN AEROBIC AND ANAEROBIC Performed at [facility], [address] [place], [place] [address]    Culture   Final    NO GROWTH 4 DAYS Performed at [facility], [address], [place], [place] [address]    Report Status PENDING  Incomplete  Blood culture (routine x 2)     Status: None (Preliminary result)   Collection Time: 05/04/24 12:46 PM   Specimen: BLOOD  Result Value Ref Range Status   Specimen Description   Final    BLOOD LEFT ANTECUBITAL Performed at [facility], [address], [place], [place] [address]    Special Requests   Final    Blood Culture adequate volume BOTTLES DRAWN AEROBIC AND ANAEROBIC Performed at [facility], [address] [place], [place] [address]    Culture   Final    NO GROWTH 4 DAYS Performed at [facility], [address], [place], [place] [address]    Report Status PENDING  Incomplete    Please note: You were cared for by a hospitalist during your hospital stay. Once you are discharged, your primary care physician will handle any further medical issues. Please note that NO REFILLS for any discharge medications will be authorized once you are discharged, as it is imperative that you return to your primary care physician (or establish a relationship with a primary care physician if you do not have one) for your post hospital discharge needs so that they can reassess your need for medications and monitor your lab values.    Time coordinating discharge: 40 minutes  SIGNED:   Ivonne Mustache, MD  [facility] 05/08/2024, 11:03 AM Pager [phone]  If 7PM-7AM, please contact night-coverage [email or web address] [facility] Physician Discharge Summary  SLATE DEBROUX FMW:[id number] DOB: 1945-08-18 DOA: 05/04/2024  PCP: Cleotilde Planas, MD  Admit date: 05/04/2024 Discharge date: 05/08/2024  Admitted From: Home Disposition:   Home  Discharge Condition:Stable CODE STATUS:FULL, DNR, Comfort Care Diet recommendation: Heart Healthy / Carb Modified / Regular / Dysphagia   Brief/Interim Summary:   Following problems were addressed during the hospitalization:   Discharge Diagnoses:  Principal Problem:   Multifocal pneumonia Active Problems:   Atrial flutter with rapid ventricular response (HCC)   Atrial fibrillation with rapid ventricular response (HCC)   Acute exacerbation of CHF (congestive heart failure) (HCC)   Acute respiratory failure with hypoxia (HCC)   Sepsis [facility])    Discharge Instructions  Discharge Instructions     Discharge  instructions   Complete by: As directed    1)Please take your medications as instructed 2)Follow up with your PCP in a week 3)You will be called by cardiology for follow-up appointment 4)Follow up with your hematologist.  Take prednisone  as instructed   Increase activity slowly   Complete by: As directed       Allergies as of 05/08/2024       Reactions   Amoxicillin-pot Clavulanate Rash   Codeine Other (See Comments)   Stomach upset        Medication List     PAUSE taking these medications    warfarin 2 MG tablet Wait to take this until: May 11, 2024 Commonly known as: COUMADIN  Take 2-4 mg by mouth daily. Take 4mg  Tue/Thr, 2mg  all other days OR as directed       TAKE these medications    amiodarone  100 MG tablet Commonly known as: PACERONE  Take 4 tablets (400 mg total) by mouth 2 (two) times daily for 5 days, THEN 2 tablets (200 mg total) 2 (two) times daily for 7 days, THEN 1 tablet (100 mg total) daily. Start taking on: May 08, 2024 What changed:  medication strength See the new instructions.   cefUROXime 500 MG tablet Commonly known as: CEFTIN Take 1 tablet (500 mg total) by mouth 2 (two) times daily for 3 days.   doxycycline  100 MG tablet Commonly known as: VIBRA -TABS Take 1 tablet (100 mg total) by mouth every 12 (twelve)  hours for 1 day.   empagliflozin  10 MG Tabs tablet Commonly known as: JARDIANCE  Take 1 tablet (10 mg total) by mouth daily.   ferrous sulfate 325 (65 FE) MG tablet Take 325 mg by mouth daily with breakfast.   furosemide  20 MG tablet Commonly known as: LASIX  Take 20 mg by mouth daily.   Imodium A-D 2 MG tablet Generic drug: loperamide Take 2 mg by mouth 4 (four) times daily as needed for diarrhea or loose stools.   levETIRAcetam  500 MG tablet Commonly known as: Keppra  Take 1 tablet in AM and 1 and 1/2 tablets in PM   magnesium  oxide 400 (240 Mg) MG tablet Commonly known as: MAG-OX Take 400 mg by mouth daily.   ondansetron  4 MG tablet Commonly known as: ZOFRAN  Take 4 mg by mouth every 8 (eight) hours as needed.   polyethylene glycol 17 g packet Commonly known as: MIRALAX / GLYCOLAX Take 17 g by mouth daily. Start taking on: May 09, 2024   predniSONE  20 MG tablet Commonly known as: DELTASONE  TAKE 3 TABLETS (60 MG TOTAL) BY MOUTH DAILY WITH BREAKFAST.   rosuvastatin  20 MG tablet Commonly known as: CRESTOR  Take 1 tablet (20 mg total) by mouth daily.   TYLENOL  500 MG tablet Generic drug: acetaminophen  Take 500 mg by mouth every 6 (six) hours as needed for mild pain (pain score 1-3) or headache.   Vitamin D3 1000 units Caps Take 1,000 Units by mouth daily.        Follow-up Information     Cleotilde Planas, MD. Schedule an appointment as soon as possible for a visit in 1 week(s).   Specialty: Family Medicine Contact information: [address] [place] [address] [phone]                Allergies[2]  Consultations:    Procedures/Studies: EP STUDY Result Date: 05/07/2024 See surgical note for result.  DG Abd 1 View Result Date: 05/05/2024 CLINICAL DATA:  NG tube placement EXAM: ABDOMEN - 1  VIEW COMPARISON:  05/05/2024, 05/04/2024 . FINDINGS: sternotomy and valve prosthesis. Streaky densities at the left base. Enteric tube tip and  side-port overlie the stomach. Enteral contrast within the colon IMPRESSION: Enteric tube tip and side-port overlie the stomach. Electronically Signed   By: Luke Bun M.D.   On: 05/05/2024 21:02   DG Abd Portable 1V-Small Bowel Obstruction Protocol-initial, 8 hr delay Result Date: 05/05/2024 EXAM: 1 VIEW XRAY OF THE ABDOMEN 05/05/2024 07:00:00 PM COMPARISON: 05/04/2024 CLINICAL HISTORY: Small bowel obstruction FINDINGS: BOWEL: Nonobstructive bowel gas pattern with contrast extending from the cecum to the rectum. SOFT TISSUES: Hernia mesh overlying the lower pelvis bilaterally. BONES: No acute fracture. IMPRESSION: 1. Nonobstructive bowel gas pattern. Contrast extends from the cecum to the rectum. Electronically signed by: Pinkie Pebbles MD 05/05/2024 07:41 PM EST RP Workstation: [id number]   DG Abdomen 1 View Result Date: 05/04/2024 EXAM: 1 VIEW XRAY OF THE ABDOMEN 05/04/2024 02:18:47 PM COMPARISON: CT abdomen and pelvis 05/04/2024. CLINICAL HISTORY: confirm NG FINDINGS: LINES, TUBES AND DEVICES: Enteric tube in place below the diaphragm with tip in the mid stomach. BOWEL: Dilated small bowel loops in visualized upper abdomen. SOFT TISSUES: No abnormal calcifications. BONES: No acute fracture. LUNGS: Patchy airspace opacity in the right mid lung. IMPRESSION: 1. Enteric tube tip terminates in the mid stomach. 2. Dilated small bowel loops in the upper abdomen. 3. Patchy right mid lung airspace opacity. Electronically signed by: Greig Pique MD 05/04/2024 03:17 PM EST RP Workstation: [id number]   CT Angio Chest PE W and/or Wo Contrast Result Date: 05/04/2024 CLINICAL DATA:  Pulmonary embolism suspected, high probability. Abdominal pain, nausea, difficulty breathing, cough. Leg and ankle swelling. Low O2 sat. EXAM: CT ANGIOGRAPHY CHEST CT ABDOMEN AND PELVIS WITH CONTRAST TECHNIQUE: Multidetector CT imaging of the chest was performed using the standard protocol during bolus administration of intravenous  contrast. Multiplanar CT image reconstructions and MIPs were obtained to evaluate the vascular anatomy. Multidetector CT imaging of the abdomen and pelvis was performed using the standard protocol during bolus administration of intravenous contrast. RADIATION DOSE REDUCTION: This exam was performed according to the departmental dose-optimization program which includes automated exposure control, adjustment of the mA and/or kV according to patient size and/or use of iterative reconstruction technique. CONTRAST:  OMNIPAQUE  IOHEXOL  350 MG/ML SOLN COMPARISON:  CT chest 11/05/2023 and CT abdomen pelvis 01/02/2024. FINDINGS: CTA CHEST FINDINGS Cardiovascular: Image quality is degraded by respiratory motion. Negative for pulmonary embolus. Atherosclerotic calcification of the aorta and coronary arteries. Aortic valve replacement. Mitral annulus calcification. Enlarged pulmonic trunk and heart. No pericardial effusion. Ascending aorta measures 3.8 cm (7/89). Mediastinum/Nodes: Thoracic inlet lymph nodes are not enlarged by CT size criteria. Mediastinal lymph nodes measure up to 1.7 cm in the low right paratracheal station, minimally larger but present dating back to 04/14/2020. 1.6 cm right hilar lymph node. No left hilar or axillary adenopathy. Air in fluid in the esophagus are indicative of dysmotility. Lungs/Pleura: Image quality is degraded by expiratory phase imaging, creating added density in the lungs. Patchy rounded consolidation in the right upper lobe. Additional rounded areas of consolidation in the left lower lobe. Nodule in the inferior posterolateral left lower lobe is seen along the left hemidiaphragm and measures 1.3 x 1.6 cm (6/100), unchanged from 04/14/2020 and considered benign. No pleural fluid. Airway is unremarkable. Musculoskeletal: Degenerative changes in the spine. Chronic mild compression of the T4 and T5 superior endplates. Review of the MIP images confirms the above findings. CT ABDOMEN  and PELVIS FINDINGS  Hepatobiliary: Liver is enlarged, 19.9 cm. Gallstone. No biliary ductal dilatation. Pancreas: Negative. Spleen: Enlarged, 15.2 cm. Adrenals/Urinary Tract: Adrenal glands are unremarkable. Low-attenuation lesions in the kidneys. No specific follow-up necessary. Kidneys are otherwise unremarkable. Ureters are decompressed. Bladder is grossly unremarkable. Stomach/Bowel: Stomach is distended with fluid. Dilated and fluid-filled jejunum with a transition point in the ventral midline low abdomen (2/51). Remainder of the small bowel is acutely decompressed. There is unobstructed small bowel extending into a right inguinal hernia. Appendix and colon are unremarkable. Vascular/Lymphatic: Atherosclerotic calcification of the aorta. No pathologically enlarged lymph nodes. Reproductive: Prostate is slightly enlarged. Other: Bilateral inguinal hernia repair. Large right inguinal hernia contains unobstructed small bowel. Small left inguinal hernia contains fat. Trace interloop fluid. No mesenteric edema. Musculoskeletal: Degenerative changes in the spine. Review of the MIP images confirms the above findings. IMPRESSION: 1. Negative for pulmonary embolus. 2. High-grade small bowel obstruction with transition point in the low ventral midline abdomen. Minimal associated interloop fluid. No mesenteric edema. 3. Right upper and left lower lobe pneumonia. Associated enlargement of mediastinal lymph nodes, likely reactive in etiology. 4. Bilateral inguinal hernia repair with a large right inguinal hernia containing unobstructed small bowel and a small left inguinal hernia contains fat. 5. Mild hepatosplenomegaly, improved from 01/02/2024. 6. Cholelithiasis. 7. Mildly enlarged prostate. 8. Aortic atherosclerosis (ICD10-I70.0). Coronary artery calcification. 9. Enlarged pulmonic trunk, indicative of pulmonary arterial hypertension. Electronically Signed   By: Newell Eke M.D.   On: 05/04/2024 13:29   CT ABDOMEN  PELVIS W CONTRAST Result Date: 05/04/2024 CLINICAL DATA:  Pulmonary embolism suspected, high probability. Abdominal pain, nausea, difficulty breathing, cough. Leg and ankle swelling. Low O2 sat. EXAM: CT ANGIOGRAPHY CHEST CT ABDOMEN AND PELVIS WITH CONTRAST TECHNIQUE: Multidetector CT imaging of the chest was performed using the standard protocol during bolus administration of intravenous contrast. Multiplanar CT image reconstructions and MIPs were obtained to evaluate the vascular anatomy. Multidetector CT imaging of the abdomen and pelvis was performed using the standard protocol during bolus administration of intravenous contrast. RADIATION DOSE REDUCTION: This exam was performed according to the departmental dose-optimization program which includes automated exposure control, adjustment of the mA and/or kV according to patient size and/or use of iterative reconstruction technique. CONTRAST:  OMNIPAQUE  IOHEXOL  350 MG/ML SOLN COMPARISON:  CT chest 11/05/2023 and CT abdomen pelvis 01/02/2024. FINDINGS: CTA CHEST FINDINGS Cardiovascular: Image quality is degraded by respiratory motion. Negative for pulmonary embolus. Atherosclerotic calcification of the aorta and coronary arteries. Aortic valve replacement. Mitral annulus calcification. Enlarged pulmonic trunk and heart. No pericardial effusion. Ascending aorta measures 3.8 cm (7/89). Mediastinum/Nodes: Thoracic inlet lymph nodes are not enlarged by CT size criteria. Mediastinal lymph nodes measure up to 1.7 cm in the low right paratracheal station, minimally larger but present dating back to 04/14/2020. 1.6 cm right hilar lymph node. No left hilar or axillary adenopathy. Air in fluid in the esophagus are indicative of dysmotility. Lungs/Pleura: Image quality is degraded by expiratory phase imaging, creating added density in the lungs. Patchy rounded consolidation in the right upper lobe. Additional rounded areas of consolidation in the left lower lobe.  Nodule in the inferior posterolateral left lower lobe is seen along the left hemidiaphragm and measures 1.3 x 1.6 cm (6/100), unchanged from 04/14/2020 and considered benign. No pleural fluid. Airway is unremarkable. Musculoskeletal: Degenerative changes in the spine. Chronic mild compression of the T4 and T5 superior endplates. Review of the MIP images confirms the above findings. CT ABDOMEN and PELVIS FINDINGS Hepatobiliary: Liver is  enlarged, 19.9 cm. Gallstone. No biliary ductal dilatation. Pancreas: Negative. Spleen: Enlarged, 15.2 cm. Adrenals/Urinary Tract: Adrenal glands are unremarkable. Low-attenuation lesions in the kidneys. No specific follow-up necessary. Kidneys are otherwise unremarkable. Ureters are decompressed. Bladder is grossly unremarkable. Stomach/Bowel: Stomach is distended with fluid. Dilated and fluid-filled jejunum with a transition point in the ventral midline low abdomen (2/51). Remainder of the small bowel is acutely decompressed. There is unobstructed small bowel extending into a right inguinal hernia. Appendix and colon are unremarkable. Vascular/Lymphatic: Atherosclerotic calcification of the aorta. No pathologically enlarged lymph nodes. Reproductive: Prostate is slightly enlarged. Other: Bilateral inguinal hernia repair. Large right inguinal hernia contains unobstructed small bowel. Small left inguinal hernia contains fat. Trace interloop fluid. No mesenteric edema. Musculoskeletal: Degenerative changes in the spine. Review of the MIP images confirms the above findings. IMPRESSION: 1. Negative for pulmonary embolus. 2. High-grade small bowel obstruction with transition point in the low ventral midline abdomen. Minimal associated interloop fluid. No mesenteric edema. 3. Right upper and left lower lobe pneumonia. Associated enlargement of mediastinal lymph nodes, likely reactive in etiology. 4. Bilateral inguinal hernia repair with a large right inguinal hernia containing unobstructed  small bowel and a small left inguinal hernia contains fat. 5. Mild hepatosplenomegaly, improved from 01/02/2024. 6. Cholelithiasis. 7. Mildly enlarged prostate. 8. Aortic atherosclerosis (ICD10-I70.0). Coronary artery calcification. 9. Enlarged pulmonic trunk, indicative of pulmonary arterial hypertension. Electronically Signed   By: Newell Eke M.D.   On: 05/04/2024 13:29   US  Venous Img Lower Bilateral Result Date: 05/04/2024 EXAM: ULTRASOUND DUPLEX OF THE BILATERAL LOWER EXTREMITY VEINS TECHNIQUE: Duplex ultrasound using B-mode/gray scaled imaging and Doppler spectral analysis and color flow was obtained of the deep venous structures of the bilateral lower extremity. COMPARISON: None available. CLINICAL HISTORY: leg swelling FINDINGS: LEFT: The common femoral vein, femoral vein, popliteal vein, and posterior tibial vein demonstrate normal compressibility with normal color flow and spectral analysis. RIGHT: The common femoral vein, femoral vein, popliteal vein, and posterior tibial vein demonstrate normal compressibility with normal color flow and spectral analysis. IMPRESSION: 1. No evidence of deep venous thrombosis. Electronically signed by: Evalene Coho MD 05/04/2024 12:21 PM EST RP Workstation: [id number]   DG Chest Port 1 View Result Date: 05/04/2024 CLINICAL DATA:  Abdominal pain and nausea with difficulty breathing and mild cough for few days. EXAM: PORTABLE CHEST 1 VIEW COMPARISON:  None Available. FINDINGS: Multiple sternal wires are present. The cardiac silhouette is mildly enlarged and unchanged in size. An artificial cardiac valve is seen. Mild to moderate severity infiltrate is seen within the periphery of the mid right lung. Mild left basilar atelectasis and/or infiltrate is also present. No pleural effusion or pneumothorax is identified. Multilevel degenerative changes seen throughout the thoracic spine. IMPRESSION: 1. Mild to moderate severity right mid lung infiltrate. 2. Mild left  basilar atelectasis and/or infiltrate. Electronically Signed   By: Suzen Dials M.D.   On: 05/04/2024 11:32      Subjective:   Discharge Exam: Vitals:   05/08/24 0000 05/08/24 0845  BP: 111/76 122/85  Pulse: 63 65  Resp: 13 12  Temp: 97.7 F (36.5 C) 97.8 F (36.6 C)  SpO2: 92% 95%   Vitals:   05/07/24 1946 05/08/24 0000 05/08/24 0347 05/08/24 0845  BP: 106/73 111/76  122/85  Pulse: (!) 59 63  65  Resp: (!) 21 13  12   Temp: 98 F (36.7 C) 97.7 F (36.5 C)  97.8 F (36.6 C)  TempSrc: Oral Oral  Oral  SpO2:  92%  95%  Weight:   77.7 kg   Height:        General: Pt is alert, awake, not in acute distress Cardiovascular: RRR, S1/S2 +, no rubs, no gallops Respiratory: CTA bilaterally, no wheezing, no rhonchi Abdominal: Soft, NT, ND, bowel sounds + Extremities: no edema, no cyanosis    The results of significant diagnostics from this hospitalization (including imaging, microbiology, ancillary and laboratory) are listed below for reference.     Microbiology: Recent Results (from the past 240 hours)  Resp panel by RT-PCR (RSV, Flu A&B, Covid) Anterior Nasal Swab     Status: None   Collection Time: 05/04/24 10:45 AM   Specimen: Anterior Nasal Swab  Result Value Ref Range Status   SARS Coronavirus 2 by RT PCR NEGATIVE NEGATIVE Final    Comment: (NOTE) SARS-CoV-2 target nucleic acids are NOT DETECTED.  The SARS-CoV-2 RNA is generally detectable in upper respiratory specimens during the acute phase of infection. The lowest concentration of SARS-CoV-2 viral copies this assay can detect is 138 copies/mL. A negative result does not preclude SARS-Cov-2 infection and should not be used as the sole basis for treatment or other patient management decisions. A negative result may occur with  improper specimen collection/handling, submission of specimen other than nasopharyngeal swab, presence of viral mutation(s) within the areas targeted by this assay, and inadequate  number of viral copies(<138 copies/mL). A negative result must be combined with clinical observations, patient history, and epidemiological information. The expected result is Negative.  Fact Sheet for Patients:  bloggercourse.com  Fact Sheet for Healthcare Providers:  seriousbroker.it  This test is no t yet approved or cleared by the [place]  FDA and  has been authorized for detection and/or diagnosis of SARS-CoV-2 by FDA under an Emergency Use Authorization (EUA). This EUA will remain  in effect (meaning this test can be used) for the duration of the COVID-19 declaration under Section 564(b)(1) of the Act, 21 U.S.C.section 360bbb-3(b)(1), unless the authorization is terminated  or revoked sooner.       Influenza A by PCR NEGATIVE NEGATIVE Final   Influenza B by PCR NEGATIVE NEGATIVE Final    Comment: (NOTE) The Xpert Xpress SARS-CoV-2/FLU/RSV plus assay is intended as an aid in the diagnosis of influenza from Nasopharyngeal swab specimens and should not be used as a sole basis for treatment. Nasal washings and aspirates are unacceptable for Xpert Xpress SARS-CoV-2/FLU/RSV testing.  Fact Sheet for Patients: bloggercourse.com  Fact Sheet for Healthcare Providers: seriousbroker.it  This test is not yet approved or cleared by the [place]  FDA and has been authorized for detection and/or diagnosis of SARS-CoV-2 by FDA under an Emergency Use Authorization (EUA). This EUA will remain in effect (meaning this test can be used) for the duration of the COVID-19 declaration under Section 564(b)(1) of the Act, 21 U.S.C. section 360bbb-3(b)(1), unless the authorization is terminated or revoked.     Resp Syncytial Virus by PCR NEGATIVE NEGATIVE Final    Comment: (NOTE) Fact Sheet for Patients: bloggercourse.com  Fact Sheet for Healthcare  Providers: seriousbroker.it  This test is not yet approved or cleared by the [place]  FDA and has been authorized for detection and/or diagnosis of SARS-CoV-2 by FDA under an Emergency Use Authorization (EUA). This EUA will remain in effect (meaning this test can be used) for the duration of the COVID-19 declaration under Section 564(b)(1) of the Act, 21 U.S.C. section 360bbb-3(b)(1), unless the authorization is terminated or revoked.  Performed at [facility], [address] [place], [place] [address]   Blood culture (routine x 2)     Status: None (Preliminary result)   Collection Time: 05/04/24 12:41 PM   Specimen: BLOOD RIGHT FOREARM  Result Value Ref Range Status   Specimen Description   Final    BLOOD RIGHT FOREARM Performed at [facility], [address], [place], [place] [address]    Special Requests   Final    Blood Culture adequate volume BOTTLES DRAWN AEROBIC AND ANAEROBIC Performed at [facility], [address] [place], [place] [address]    Culture   Final    NO GROWTH 4 DAYS Performed at [facility], [address] [place], [place] [address]    Report Status PENDING  Incomplete  Blood culture (routine x 2)     Status: None (Preliminary result)   Collection Time: 05/04/24 12:46 PM   Specimen: BLOOD  Result Value Ref Range Status   Specimen Description   Final    BLOOD LEFT ANTECUBITAL Performed at [facility], [address] [place], [place] [address]    Special Requests   Final    Blood Culture adequate volume BOTTLES DRAWN AEROBIC AND ANAEROBIC Performed at [facility], [address] [place], [place] [address]    Culture   Final    NO GROWTH 4 DAYS Performed at [facility], [address] [place], [place] [address]    Report Status PENDING  Incomplete     Labs: BNP (last 3 results) No results for input(s): BNP  in the last 8760 hours. Basic Metabolic Panel: Recent Labs  Lab 05/04/24 1027 05/05/24 0420 05/06/24 0451 05/07/24 0355 05/08/24 0519  NA 140 140 139 136 137  K 3.9 4.1 4.2 4.2 3.7  CL 99 101 99 101 104  CO2 25 26 23 23 24   GLUCOSE 120* 91 131* 104* 76  BUN 20 28* 34* 33* 32*  CREATININE 1.34* 1.49* 1.63* 1.34* 1.33*  CALCIUM  9.9 8.5* 9.2 8.5* 8.7*  MG  --  2.2  --   --   --    Liver Function Tests: Recent Labs  Lab 05/05/24 0420  AST 22  ALT 28  ALKPHOS 75  BILITOT 2.2*  PROT 5.9*  ALBUMIN  3.5   No results for input(s): LIPASE, AMYLASE in the last 168 hours. No results for input(s): AMMONIA in the last 168 hours. CBC: Recent Labs  Lab 05/04/24 1027 05/05/24 0420 05/06/24 0451 05/07/24 0355 05/08/24 0519  WBC 28.1* 14.9* 14.2* 12.3* 9.5  NEUTROABS 25.3*  --   --   --   --   HGB 16.5 13.7 14.3 12.6* 12.6*  HCT 49.8 41.2 45.2 38.2* 37.8*  MCV 90.9 92.0 95.6 92.9 91.5  PLT 139* 82* 104* 91* 77*   Cardiac Enzymes: No results for input(s): CKTOTAL, CKMB, CKMBINDEX, TROPONINI in the last 168 hours. BNP: Invalid input(s): POCBNP CBG: No results for input(s): GLUCAP in the last 168 hours. D-Dimer No results for input(s): DDIMER in the last 72 hours. Hgb A1c No results for input(s): HGBA1C in the last 72 hours. Lipid Profile No results for input(s): CHOL, HDL, LDLCALC, TRIG, CHOLHDL, LDLDIRECT in the last 72 hours. Thyroid  function studies No results for input(s): TSH, T4TOTAL, T3FREE, THYROIDAB in the last 72 hours.  Invalid input(s): FREET3 Anemia work up No results for input(s): VITAMINB12, FOLATE, FERRITIN, TIBC, IRON, RETICCTPCT in the last 72 hours. Urinalysis    Component Value Date/Time  COLORURINE YELLOW 05/04/2024 1120   APPEARANCEUR CLEAR 05/04/2024 1120   LABSPEC 1.026 05/04/2024 1120   PHURINE 6.0 05/04/2024 1120   GLUCOSEU >1,000 (A) 05/04/2024 1120   HGBUR NEGATIVE 05/04/2024 1120    BILIRUBINUR NEGATIVE 05/04/2024 1120   KETONESUR NEGATIVE 05/04/2024 1120   PROTEINUR TRACE (A) 05/04/2024 1120   UROBILINOGEN 0.2 12/28/2009 1126   NITRITE NEGATIVE 05/04/2024 1120   LEUKOCYTESUR NEGATIVE 05/04/2024 1120   Sepsis Labs Recent Labs  Lab 05/05/24 0420 05/06/24 0451 05/07/24 0355 05/08/24 0519  WBC 14.9* 14.2* 12.3* 9.5   Microbiology Recent Results (from the past 240 hours)  Resp panel by RT-PCR (RSV, Flu A&B, Covid) Anterior Nasal Swab     Status: None   Collection Time: 05/04/24 10:45 AM   Specimen: Anterior Nasal Swab  Result Value Ref Range Status   SARS Coronavirus 2 by RT PCR NEGATIVE NEGATIVE Final    Comment: (NOTE) SARS-CoV-2 target nucleic acids are NOT DETECTED.  The SARS-CoV-2 RNA is generally detectable in upper respiratory specimens during the acute phase of infection. The lowest concentration of SARS-CoV-2 viral copies this assay can detect is 138 copies/mL. A negative result does not preclude SARS-Cov-2 infection and should not be used as the sole basis for treatment or other patient management decisions. A negative result may occur with  improper specimen collection/handling, submission of specimen other than nasopharyngeal swab, presence of viral mutation(s) within the areas targeted by this assay, and inadequate number of viral copies(<138 copies/mL). A negative result must be combined with clinical observations, patient history, and epidemiological information. The expected result is Negative.  Fact Sheet for Patients:  bloggercourse.com  Fact Sheet for Healthcare Providers:  seriousbroker.it  This test is no t yet approved or cleared by the [place]  FDA and  has been authorized for detection and/or diagnosis of SARS-CoV-2 by FDA under an Emergency Use Authorization (EUA). This EUA will remain  in effect (meaning this test can be used) for the duration of the COVID-19  declaration under Section 564(b)(1) of the Act, 21 U.S.C.section 360bbb-3(b)(1), unless the authorization is terminated  or revoked sooner.       Influenza A by PCR NEGATIVE NEGATIVE Final   Influenza B by PCR NEGATIVE NEGATIVE Final    Comment: (NOTE) The Xpert Xpress SARS-CoV-2/FLU/RSV plus assay is intended as an aid in the diagnosis of influenza from Nasopharyngeal swab specimens and should not be used as a sole basis for treatment. Nasal washings and aspirates are unacceptable for Xpert Xpress SARS-CoV-2/FLU/RSV testing.  Fact Sheet for Patients: bloggercourse.com  Fact Sheet for Healthcare Providers: seriousbroker.it  This test is not yet approved or cleared by the [place]  FDA and has been authorized for detection and/or diagnosis of SARS-CoV-2 by FDA under an Emergency Use Authorization (EUA). This EUA will remain in effect (meaning this test can be used) for the duration of the COVID-19 declaration under Section 564(b)(1) of the Act, 21 U.S.C. section 360bbb-3(b)(1), unless the authorization is terminated or revoked.     Resp Syncytial Virus by PCR NEGATIVE NEGATIVE Final    Comment: (NOTE) Fact Sheet for Patients: bloggercourse.com  Fact Sheet for Healthcare Providers: seriousbroker.it  This test is not yet approved or cleared by the [place]  FDA and has been authorized for detection and/or diagnosis of SARS-CoV-2 by FDA under an Emergency Use Authorization (EUA). This EUA will remain in effect (meaning this test can be used) for the duration of the COVID-19 declaration under Section 564(b)(1) of the Act, 21 U.S.C.  section 360bbb-3(b)(1), unless the authorization is terminated or revoked.  Performed at [facility], [address], [place], [place] [address]   Blood culture (routine x 2)     Status: None (Preliminary result)    Collection Time: 05/04/24 12:41 PM   Specimen: BLOOD RIGHT FOREARM  Result Value Ref Range Status   Specimen Description   Final    BLOOD RIGHT FOREARM Performed at [facility], [address] [place], [place] [address]    Special Requests   Final    Blood Culture adequate volume BOTTLES DRAWN AEROBIC AND ANAEROBIC Performed at [facility], [address] [place], [place] [address]    Culture   Final    NO GROWTH 4 DAYS Performed at [facility], [address], [place], [place] [address]    Report Status PENDING  Incomplete  Blood culture (routine x 2)     Status: None (Preliminary result)   Collection Time: 05/04/24 12:46 PM   Specimen: BLOOD  Result Value Ref Range Status   Specimen Description   Final    BLOOD LEFT ANTECUBITAL Performed at [facility], [address] [place], [place] [address]    Special Requests   Final    Blood Culture adequate volume BOTTLES DRAWN AEROBIC AND ANAEROBIC Performed at [facility], [address], [place], [place] [address]    Culture   Final    NO GROWTH 4 DAYS Performed at [facility], [address] [place], [place] [address]    Report Status PENDING  Incomplete    Please note: You were cared for by a hospitalist during your hospital stay. Once you are discharged, your primary care physician will handle any further medical issues. Please note that NO REFILLS for any discharge medications will be authorized once you are discharged, as it is imperative that you return to your primary care physician (or establish a relationship with a primary care physician if you do not have one) for your post hospital discharge needs so that they can reassess your need for medications and monitor your lab values.    Time coordinating discharge: 40 minutes  SIGNED:   Ivonne Mustache, MD  [facility] 05/08/2024, 11:03 AM Pager [phone]  If 7PM-7AM, please  contact night-coverage [email or web address] Password [email or web address]    [1]  Allergies Allergen Reactions   Amoxicillin-Pot Clavulanate Rash   Codeine Other (See Comments)    Stomach upset  [2]  Allergies Allergen Reactions   Amoxicillin-Pot Clavulanate Rash   Codeine Other (See Comments)    Stomach upset

## 2024-05-08 NOTE — Plan of Care (Signed)
" °  Problem: Education: Goal: Knowledge of General Education information will improve Description: Including pain rating scale, medication(s)/side effects and non-pharmacologic comfort measures Outcome: Adequate for Discharge   Problem: Health Behavior/Discharge Planning: Goal: Ability to manage health-related needs will improve Outcome: Adequate for Discharge   Problem: Clinical Measurements: Goal: Ability to maintain clinical measurements within normal limits will improve Outcome: Adequate for Discharge Goal: Will remain free from infection Outcome: Adequate for Discharge Goal: Diagnostic test results will improve Outcome: Adequate for Discharge Goal: Respiratory complications will improve Outcome: Adequate for Discharge Goal: Cardiovascular complication will be avoided Outcome: Adequate for Discharge   Problem: Activity: Goal: Risk for activity intolerance will decrease Outcome: Adequate for Discharge   Problem: Nutrition: Goal: Adequate nutrition will be maintained Outcome: Adequate for Discharge   Problem: Coping: Goal: Level of anxiety will decrease Outcome: Adequate for Discharge   Problem: Elimination: Goal: Will not experience complications related to bowel motility Outcome: Adequate for Discharge Goal: Will not experience complications related to urinary retention Outcome: Adequate for Discharge   Problem: Pain Managment: Goal: General experience of comfort will improve and/or be controlled Outcome: Adequate for Discharge   Problem: Safety: Goal: Ability to remain free from injury will improve Outcome: Adequate for Discharge   Problem: Skin Integrity: Goal: Risk for impaired skin integrity will decrease Outcome: Adequate for Discharge   Problem: Education: Goal: Knowledge of disease or condition will improve Outcome: Adequate for Discharge Goal: Understanding of medication regimen will improve Outcome: Adequate for Discharge Goal: Individualized  Educational Video(s) Outcome: Adequate for Discharge   Problem: Activity: Goal: Ability to tolerate increased activity will improve Outcome: Adequate for Discharge   Problem: Cardiac: Goal: Ability to achieve and maintain adequate cardiopulmonary perfusion will improve Outcome: Adequate for Discharge   Problem: Health Behavior/Discharge Planning: Goal: Ability to safely manage health-related needs after discharge will improve Outcome: Adequate for Discharge   Problem: Education: Goal: Ability to demonstrate management of disease process will improve Outcome: Adequate for Discharge Goal: Ability to verbalize understanding of medication therapies will improve Outcome: Adequate for Discharge Goal: Individualized Educational Video(s) Outcome: Adequate for Discharge   Problem: Activity: Goal: Capacity to carry out activities will improve Outcome: Adequate for Discharge   Problem: Cardiac: Goal: Ability to achieve and maintain adequate cardiopulmonary perfusion will improve Outcome: Adequate for Discharge   Problem: Activity: Goal: Ability to tolerate increased activity will improve Outcome: Adequate for Discharge   Problem: Clinical Measurements: Goal: Ability to maintain a body temperature in the normal range will improve Outcome: Adequate for Discharge   Problem: Respiratory: Goal: Ability to maintain adequate ventilation will improve Outcome: Adequate for Discharge Goal: Ability to maintain a clear airway will improve Outcome: Adequate for Discharge   "

## 2024-05-08 NOTE — Discharge Instructions (Addendum)
 Amiodarone  Dose Calendar 1/23- 1/27: take 400mg  (2 tablets) by mouth twice daily 1/28- 2/3: take 200mg  (1 tablet) by mouth twice daily  2/4 and on: take 100mg  (half tablet) by mouth once daily

## 2024-05-08 NOTE — Telephone Encounter (Signed)
 Patient on warfarin with history of atrial fibrillation/flutter with recent cardioversion.  He has his INRs checked with his PCP however given storm there is concern that they may not be open.  We will plan to do INR next Wednesday at our facilities just in case this falls through.

## 2024-05-08 NOTE — Care Management Important Message (Signed)
 Important Message  Patient Details  Name: Alan Willis MRN: [id number] Date of Birth: [birth year]   Important Message Given:  Yes - Medicare IM     Vonzell Arrie Sharps 05/08/2024, 12:17 PM

## 2024-05-08 NOTE — Progress Notes (Signed)
 Physical Therapy Treatment Patient Details Name: Alan Willis MRN: [id number] DOB: [birth year] Today's Date: 05/08/2024   History of Present Illness 79 y.o. male presents to [facility] 05/04/24 with SOB, cough, and fever. Admitted with sepsis as well as acute hypoxemic respiratory failure in setting of PNA and decompensated HF. Developed a-fib with RVR, DCCV 1/22. 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 Pt progressed by increased gait distance to 3101ft with supervision and no AD. Pt was occasionally unsteady 2/2 to low foot clearance, however, pt declined use of an AD. Also worked on psychologist, counselling with pt able to negotiate 6 steps with one handrail. Pt continues to feel like he is at his mobility baseline with no post-acute PT needed. Pt feels comfortable d/c home when medically stable. Will continue to follow acutely.   94% SpO2 on RA 80 BPM at rest 90s-128 BPM during gait   If plan is discharge home, recommend the following: Assist for transportation;Help with stairs or ramp for entrance   Can travel by private vehicle      Yes  Equipment Recommendations  None recommended by PT       Precautions / Restrictions Precautions Precautions: Fall Recall of Precautions/Restrictions: Intact Precaution/Restrictions Comments: watch HR Restrictions Weight Bearing Restrictions Per Provider Order: No     Mobility  Bed Mobility Overal bed mobility: Independent   Transfers Overall transfer level: Modified independent Equipment used: None   Ambulation/Gait Ambulation/Gait assistance: Supervision Gait Distance (Feet): 300 Feet Assistive device: None Gait  Pattern/deviations: Step-through pattern, Decreased stride length, Decreased dorsiflexion - left, Decreased dorsiflexion - right Gait velocity: decr    General Gait Details: low foot clearance with pt occasionally tripping over feet. Slightly unsteady gait, however, no overt LOB. Declined use of AD   Stairs Stairs: Yes Stairs assistance: Contact guard assist Stair Management: One rail Right, Step to pattern, Forwards Number of Stairs: 6 General stair comments: Step to pattern with difficulty clearing feet on steps. Cues to increase step height. CGA for safety      Balance Overall balance assessment: Needs assistance Sitting-balance support: No upper extremity supported, Feet supported Sitting balance-Leahy Scale: Normal    Standing balance support: No upper extremity supported Standing balance-Leahy Scale: Fair     Hotel Manager: Impaired Factors Affecting Communication: Hearing impaired  Cognition Arousal: Alert Behavior During Therapy: WFL for tasks assessed/performed   PT - Cognitive impairments: No apparent impairments    Following commands: Intact      Cueing Cueing Techniques: Verbal cues, Tactile cues  Exercises General Exercises - Lower Extremity Long Arc Quad: AROM, Both, 10 reps, Seated (x10 sec isometric hold)        Pertinent Vitals/Pain Pain Assessment Pain Assessment: No/denies pain     PT Goals (current goals can now be found in the care plan section) Acute Rehab PT Goals PT Goal Formulation: With patient Time For Goal Achievement: 05/20/24 Potential to Achieve Goals: Good Progress towards PT goals: Progressing toward goals    Frequency    Min 1X/week       AM-PAC PT 6 Clicks Mobility   Outcome Measure  Help needed turning from your back to your side while in a flat bed without using bedrails?: None  Help needed moving from lying on your back to sitting on the side of a flat bed without using bedrails?:  None Help needed moving to and from a bed to a chair (including a wheelchair)?: None Help needed standing up from a chair using your arms (e.g., wheelchair or bedside chair)?: None Help needed to walk in hospital room?: A Little Help needed climbing 3-5 steps with a railing? : A Little 6 Click Score: 22    End of Session Equipment Utilized During Treatment: Gait belt Activity Tolerance: Patient tolerated treatment well Patient left: in bed;with call bell/phone within reach Nurse Communication: Mobility status;Other (comment) (HR) PT Visit Diagnosis: Other abnormalities of gait and mobility (R26.89)     Time: 9249-9193 PT Time Calculation (min) (ACUTE ONLY): 16 min  Charges:    $Gait Training: 8-22 mins PT General Charges $$ ACUTE PT VISIT: 1 Visit                    Kate ORN, PT, DPT Secure Chat Preferred  Rehab Office [phone]   Kate BRAVO [name] 05/08/2024, 8:20 AM

## 2024-05-08 NOTE — Progress Notes (Signed)
 "  Rounding Note   Patient Name: Alan Willis Date of Encounter: 05/08/2024  [facility] Cardiologist: Redell Shallow, MD   Subjective -Underwent successful DCCV yesterday - The patient had a brief recurrence of SVT to the 130s that lasted for about an hour yesterday evening, but he converted back to NSR. - He continues to have no symptoms and is eating and drinking just fine.  Scheduled Meds:  amiodarone   400 mg Oral BID   Followed by   NOREEN ON 05/13/2024] amiodarone   200 mg Oral BID   Followed by   NOREEN ON 05/20/2024] amiodarone   100 mg Oral Daily   cefUROXime   500 mg Oral BID   doxycycline   100 mg Oral Q12H   furosemide   20 mg Oral Daily   levETIRAcetam   500 mg Oral Daily   levETIRAcetam   750 mg Oral QHS   polyethylene glycol  17 g Oral Daily   predniSONE   20 mg Oral Q breakfast   Warfarin - Pharmacist Dosing Inpatient   Does not apply q1600   Continuous Infusions:  PRN Meds: acetaminophen  **OR** acetaminophen , fentaNYL  (SUBLIMAZE ) injection, naLOXone  (NARCAN )  injection   Vital Signs  Vitals:   05/07/24 1946 05/08/24 0000 05/08/24 0347 05/08/24 0845  BP: 106/73 111/76  122/85  Pulse: (!) 59 63  65  Resp: (!) 21 13  12   Temp: 98 F (36.7 C) 97.7 F (36.5 C)  97.8 F (36.6 C)  TempSrc: Oral Oral  Oral  SpO2:  92%  95%  Weight:   77.7 kg   Height:        Intake/Output Summary (Last 24 hours) at 05/08/2024 1224 Last data filed at 05/08/2024 0900 Gross per 24 hour  Intake 4211.28 ml  Output 300 ml  Net 3911.28 ml      05/08/2024    3:47 AM 05/07/2024    5:00 AM 05/06/2024    3:44 AM  Last 3 Weights  Weight (lbs) 171 lb 6.4 oz 168 lb 11.2 oz 164 lb 14.4 oz  Weight (kg) 77.747 kg 76.522 kg 74.798 kg      Telemetry NSR- Personally Reviewed  ECG  NSR- Personally Reviewed  Physical Exam  GEN: No acute distress.   Neck: No JVD Cardiac: RRR, systolic click from AVR;  II/VI systolic murmur at the RUSB, harsh III/VI diastolic murmur at  apex, no rubs or gallops Lungs: Bibasilar rales that are improving, no wheezing or rhonchi Abd: Soft, nontender, nondistended, normoactive bowel sounds Ext: Trace bilateral pitting edema, radial pulses 2+ Musculoskeletal: No deformities, BUE and BLE strength normal and equal Skin: Warm and dry, no rashes   Neuro: Alert and oriented to person, place, time, and situation, CNII-XII grossly intact, no gross deficits  Psych: Normal mood and affect   Labs High Sensitivity Troponin:  No results for input(s): TROPONINIHS in the last 720 hours.  Recent Labs  Lab 05/04/24 1027 05/05/24 0420  TRNPT 86* 74*       Chemistry Recent Labs  Lab 05/05/24 0420 05/06/24 0451 05/07/24 0355 05/08/24 0519  NA 140 139 136 137  K 4.1 4.2 4.2 3.7  CL 101 99 101 104  CO2 26 23 23 24   GLUCOSE 91 131* 104* 76  BUN 28* 34* 33* 32*  CREATININE 1.49* 1.63* 1.34* 1.33*  CALCIUM  8.5* 9.2 8.5* 8.7*  MG 2.2  --   --   --   PROT 5.9*  --   --   --   ALBUMIN  3.5  --   --   --  AST 22  --   --   --   ALT 28  --   --   --   ALKPHOS 75  --   --   --   BILITOT 2.2*  --   --   --   GFRNONAA 48* 43* 55* 55*  ANIONGAP 12 17* 12 9    Lipids No results for input(s): CHOL, TRIG, HDL, LABVLDL, LDLCALC, CHOLHDL in the last 168 hours.  Hematology Recent Labs  Lab 05/06/24 0451 05/07/24 0355 05/08/24 0519  WBC 14.2* 12.3* 9.5  RBC 4.73 4.11* 4.13*  HGB 14.3 12.6* 12.6*  HCT 45.2 38.2* 37.8*  MCV 95.6 92.9 91.5  MCH 30.2 30.7 30.5  MCHC 31.6 33.0 33.3  RDW 18.1* 18.1* 17.8*  PLT 104* 91* 77*   Thyroid  No results for input(s): TSH, FREET4 in the last 168 hours.  BNP Recent Labs  Lab 05/04/24 1027  PROBNP 4,570.0*    DDimer  Recent Labs  Lab 05/04/24 1027  DDIMER 0.68*     Radiology  EP STUDY Result Date: 05/07/2024 See surgical note for result.   Cardiac Studies DCCV on 05/07/2024  Patient Profile   Mr. 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 Cardiology is consulted for the evaluation of atrial fibrillation with RVR and CHF.   Assessment & Plan    #PAF/AFlutter w/ RVR - Patient developed atrial fibrillation with RVR in the setting of pulmonary infiltrates concerning for pneumonia and an SBO. - These 2 acute illnesses are certainly the driver's of his RVR and his RVR will likely improve with resolution of these illnesses. - He was initially managed with IV amiodarone , but his SBO has resolved and he is able to take p.o. now. -Telemetry as of 05/06/2024 is suggestive of atrial arrhythmia and ECG confirmed atrial flutter with RVR - Patient now s/p DCCV on 05/07/2024 remains in NSR. Continue p.o. amiodarone  load with his maintenance dose being 100 mg p.o. daily Patient's INR continues to rise and is currently 3.9.  I am working closely with pharmacy to determine the best strategy for managing his warfarin.  The plan is for the patient to hold warfarin until early next week when he can get an INR check with either his PCP or in our vascular clinic.  If the patient is unable to get an INR checked on Wednesday or sooner due to the inclement weather, then I instructed the patient to start taking 1 mg of p.o. warfarin at that time until an INR can be checked.  The patient and his wife verbalized an understanding. Recheck INR in a.m. Maintain telemetry   #Chronic HFmrEF - The patient was noted to have an elevated BNP on admission; however, he has no evidence of volume overload by physical exam. - In fact, I suspect that the patient may be somewhat dehydrated in the setting of his SBO and pneumonia. - The patient has been adequately resuscitated from an IVF standpoint and now has some peripheral edema.  Will restart his home Lasix . Restart  Lasix  20 mg daily Restart Jardiance  10 mg daily Strict I's and O's   #Severe AS s/p Mechanical AVR - AVR was performed remotely and he has been on warfarin chronically. - The patient can now take p.o. 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 -Would favor slower heart rates to increase diastolic filling time. Treatment of tachycardia as above CTM   #AKI on CKD - I suspect that this is prerenal and would favor rehydration given SBO and sepsis. Daily BMP   #PNA #SBO #Seizures Per primary  [facility] will sign off.   The patient is ready for discharge today from a cardiac standpoint. Medication Recommendations: Continue oral amiodarone  load, warfarin plan as described above. Other recommendations (labs, testing, etc): Outpatient INR Follow up as an outpatient: Cardiology will arrange cardiology follow-up For questions or updates, please contact [facility] Please consult [email or web address] for contact info under       Signed, Georganna Archer, MD  05/08/2024, 12:24 PM    "

## 2024-05-08 NOTE — Progress Notes (Addendum)
 PHARMACY - ANTICOAGULATION CONSULT NOTE  Pharmacy Consult for warfarin Indication: mechAVR  Allergies[1]  Patient Measurements: Height: 5' 9 (175.3 cm) Weight: 77.7 kg (171 lb 6.4 oz) IBW/kg (Calculated) : 70.7 HEPARIN  DW (KG): 75.1  Vital Signs: Temp: 97.8 F (36.6 C) (01/23 0845) Temp Source: Oral (01/23 0845) BP: 122/85 (01/23 0845) Pulse Rate: 65 (01/23 0845)  Labs: Recent Labs    05/06/24 0451 05/07/24 0355 05/08/24 0519  HGB 14.3 12.6* 12.6*  HCT 45.2 38.2* 37.8*  PLT 104* 91* 77*  LABPROT 28.3* 38.1* 40.3*  INR 2.5* 3.7* 3.9*  CREATININE 1.63* 1.34* 1.33*    Estimated Creatinine Clearance: 46.5 mL/min (A) (by C-G formula based on SCr of 1.33 mg/dL (H)).   Medical History: Past Medical History:  Diagnosis Date   A-fib [facility])    Atrial flutter with rapid ventricular response (HCC) 03/16/2021   Cholelithiasis 2011   Chronic anticoagulation 03/03/2023   Colon, diverticulosis    H/O heart valve replacement with mechanical valve 03/03/2023   Bjork-Shiley tilting disc valve in 1983 at [facility] in [place], [place] .        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 Warfarin was held for SBO and INR was 2.3-2.5 despite holding warfarin. Amiodarone  started 1/19 for afib. Pharmacy consulted for warfarin dosing.   Patient has been on amiodarone  but did receive a new load this admit. Also on doxycylcine, which has a mild interaction. INR up to 3.9, which is supratherapeutic, after 1mg  warfarin since 1/19. Patient has been eating less than usual. Wife states INR has been labile since being on steroids since Oct 2025.  Warfarin PTA 4mg  Tue/Thr, 2mg  all other days. Follow[facility] by PCP at [facility].   Goal of Therapy:  INR 2.5 -3.5  Monitor platelets by anticoagulation protocol: Yes   Plan:  Hold  warfarin until INR is near 2.5 - may need 0.5mg  just two times a week with new amiodarone  interaction  Monitor daily INR, signs/symptoms of bleeding  For discharge- recommend holding warfarin with INR follow up Monday 1/26 or Tuesday 1/27 as able. Consider warfarin 0.5mg  twice weekly depending on Monday INR. Discussed this plan with patient and wife 1/23, who are in agreement.   Jinnie Door, PharmD, BCPS, BCCP Clinical Pharmacist  Please check AMION for all [facility] Pharmacy phone numbers After 10:00 PM, call Main Pharmacy [phone]       [1]  Allergies Allergen Reactions   Amoxicillin-Pot Clavulanate Rash   Codeine Other (See Comments)    Stomach upset

## 2024-05-08 NOTE — Progress Notes (Signed)
 Mobility Specialist Progress Note;    05/08/24 1040  Mobility  Activity Ambulated with assistance  Level of Assistance Contact guard assist, steadying assist  Assistive Device None  Distance Ambulated (ft) 250 ft  Activity Response Tolerated well  Mobility Referral Yes  Mobility visit 1 Mobility  Mobility Specialist Start Time (ACUTE ONLY) 1040  Mobility Specialist Stop Time (ACUTE ONLY) 1053  Mobility Specialist Time Calculation (min) (ACUTE ONLY) 13 min   Pt received standing near EoB, agreeable to mobility. No physical assistance required, CGA for safety. VSS throughout. Pt returned to bed with call bell and personal belongings in reach. Family in room.   Ricky Janeal MATSU, BS Mobility Specialist Please contact via Special Educational Needs Teacher or [employer] [phone]

## 2024-05-09 LAB — CULTURE, BLOOD (ROUTINE X 2)
Culture: NO GROWTH
Culture: NO GROWTH
Special Requests: ADEQUATE
Special Requests: ADEQUATE

## 2024-05-11 ENCOUNTER — Telehealth: Payer: Self-pay | Admitting: Home Health

## 2024-05-11 NOTE — Telephone Encounter (Signed)
 Patient's wife called after hour line today, reporting patient has some ankle edema bilaterally. His VS are stable. He offered no complaints, especially SOB or chest pain. She mistakenly gave him lasix  40mg  instead of 20mg  today. His weight is 170ib today, and was 171 ib on 05/09/23. Explained to the wife that with above information, patient is unlikely in decompensated CHF, maybe dependent edema. Advised the patient to continue lasix  20mg  daily, monitor symptoms of SOB, chest discomfort, weight gain >2 ib in a day or 5 ibs in a week, and worsening of BLE edema, that suggestive of CHF. Wife voiced understanding.

## 2024-05-19 ENCOUNTER — Other Ambulatory Visit: Payer: Self-pay | Admitting: Physician Assistant

## 2024-05-19 DIAGNOSIS — D696 Thrombocytopenia, unspecified: Secondary | ICD-10-CM

## 2024-05-20 ENCOUNTER — Other Ambulatory Visit: Payer: Self-pay

## 2024-05-20 ENCOUNTER — Inpatient Hospital Stay: Attending: Physician Assistant

## 2024-05-20 ENCOUNTER — Inpatient Hospital Stay: Admitting: Physician Assistant

## 2024-05-20 VITALS — BP 119/70 | HR 59 | Temp 97.5°F | Resp 20 | Wt 158.6 lb

## 2024-05-20 DIAGNOSIS — D696 Thrombocytopenia, unspecified: Secondary | ICD-10-CM

## 2024-05-20 DIAGNOSIS — D649 Anemia, unspecified: Secondary | ICD-10-CM | POA: Diagnosis not present

## 2024-05-20 LAB — PROTIME-INR
INR: 1.8 — ABNORMAL HIGH (ref 0.8–1.2)
Prothrombin Time: 21.4 s — ABNORMAL HIGH (ref 11.4–15.2)

## 2024-05-20 LAB — CBC WITH DIFFERENTIAL (CANCER CENTER ONLY)
Abs Immature Granulocytes: 0.09 10*3/uL — ABNORMAL HIGH (ref 0.00–0.07)
Basophils Absolute: 0 10*3/uL (ref 0.0–0.1)
Basophils Relative: 0 %
Eosinophils Absolute: 0 10*3/uL (ref 0.0–0.5)
Eosinophils Relative: 0 %
HCT: 46.7 % (ref 39.0–52.0)
Hemoglobin: 15.2 g/dL (ref 13.0–17.0)
Immature Granulocytes: 1 %
Lymphocytes Relative: 15 %
Lymphs Abs: 1.7 10*3/uL (ref 0.7–4.0)
MCH: 30 pg (ref 26.0–34.0)
MCHC: 32.5 g/dL (ref 30.0–36.0)
MCV: 92.1 fL (ref 80.0–100.0)
Monocytes Absolute: 0.6 10*3/uL (ref 0.1–1.0)
Monocytes Relative: 6 %
Neutro Abs: 8.7 10*3/uL — ABNORMAL HIGH (ref 1.7–7.7)
Neutrophils Relative %: 78 %
Platelet Count: 134 10*3/uL — ABNORMAL LOW (ref 150–400)
RBC: 5.07 MIL/uL (ref 4.22–5.81)
RDW: 17.8 % — ABNORMAL HIGH (ref 11.5–15.5)
WBC Count: 11.2 10*3/uL — ABNORMAL HIGH (ref 4.0–10.5)
nRBC: 0 % (ref 0.0–0.2)

## 2024-05-20 LAB — CMP (CANCER CENTER ONLY)
ALT: 35 U/L (ref 0–44)
AST: 31 U/L (ref 15–41)
Albumin: 4.3 g/dL (ref 3.5–5.0)
Alkaline Phosphatase: 95 U/L (ref 38–126)
Anion gap: 11 (ref 5–15)
BUN: 13 mg/dL (ref 8–23)
CO2: 28 mmol/L (ref 22–32)
Calcium: 9.4 mg/dL (ref 8.9–10.3)
Chloride: 99 mmol/L (ref 98–111)
Creatinine: 1.44 mg/dL — ABNORMAL HIGH (ref 0.61–1.24)
GFR, Estimated: 50 mL/min — ABNORMAL LOW
Glucose, Bld: 112 mg/dL — ABNORMAL HIGH (ref 70–99)
Potassium: 3.9 mmol/L (ref 3.5–5.1)
Sodium: 137 mmol/L (ref 135–145)
Total Bilirubin: 1.8 mg/dL — ABNORMAL HIGH (ref 0.0–1.2)
Total Protein: 7.1 g/dL (ref 6.5–8.1)

## 2024-05-20 LAB — LACTATE DEHYDROGENASE: LDH: 270 U/L — ABNORMAL HIGH (ref 105–235)

## 2024-05-20 LAB — IMMATURE PLATELET FRACTION: Immature Platelet Fraction: 4 % (ref 1.2–8.6)

## 2024-05-22 NOTE — Progress Notes (Unsigned)
 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  Patient admitted with GI bleed November 2024 due to Mallory-Weiss tear which was treated with hemostatic clip.  CTA November 2024 Calcium  score 588.  Echocardiogram November 2024 showed ejection fraction 40 to 45%, severe biatrial enlargement, moderate to severe mitral stenosis, mild to moderate mitral regurgitation, prior mechanical aortic valve.  Decreased ejection fraction felt possibly secondary to tachycardia.  Patient underwent cardioversion at that time but atrial flutter recurred and he was felt to be a failure of tikosyn .  He was therefore initiated on amiodarone . Follow-up monitor showed persistent atrial flutter and rate control was pursued as he was asymptomatic. Monitor July 2025 showed sinus rhythm, transient junctional rhythm, PACs, PVCs and rare couplet.  Echocardiogram July 2025 showed ejection fraction 40 to 45%, mild left ventricular enlargement, mild left ventricular hypertrophy, mild RV dysfunction, severe biatrial enlargement, moderate to severe mitral regurgitation, moderate to severe mitral stenosis with mean gradient 7 mmHg, status post aortic valve replacement with trace aortic insufficiency and mean gradient 16.7 mmHg.  Also with recent development of thrombocytopenia requiring hematology oncology evaluation.  Abdominal CT September 2025 showed new splenomegaly.  Patient had recurrent atrial fibrillation/flutter had cardioversion January 2026.  Since he was last seen   Current Outpatient Medications  Medication Sig Dispense Refill   amiodarone  (PACERONE ) 200 MG tablet Take 2 tablets (400 mg total) by mouth 2 (two) times daily for 5 days, THEN 1 tablet (200 mg total) 2 (two) times daily for 7 days, THEN 0.5 tablets (100 mg total) daily. 49 tablet 0   Cholecalciferol (VITAMIN D3) 1000 units CAPS Take  1,000 Units by mouth daily.     empagliflozin  (JARDIANCE ) 10 MG TABS tablet Take 1 tablet (10 mg total) by mouth daily. 90 tablet 3   ferrous sulfate 325 (65 FE) MG tablet Take 325 mg by mouth daily with breakfast.     furosemide  (LASIX ) 20 MG tablet Take 20 mg by mouth daily.     furosemide  (LASIX ) 40 MG tablet Take 40 mg by mouth daily.     IMODIUM A-D 2 MG tablet Take 2 mg by mouth 4 (four) times daily as needed for diarrhea or loose stools.     levETIRAcetam  (KEPPRA ) 500 MG tablet Take 1 tablet in AM and 1 and 1/2 tablets in PM 225 tablet 3   magnesium  oxide (MAG-OX) 400 (240 Mg) MG tablet Take 400 mg by mouth daily.     ondansetron  (ZOFRAN ) 4 MG tablet Take 4 mg by mouth every 8 (eight) hours as needed.     polyethylene glycol powder (GLYCOLAX /MIRALAX ) 17 GM/SCOOP powder Dissolve 1 capful (17g) in 4-8 ounces of liquid and take by mouth daily. 238 g 0   predniSONE  (DELTASONE ) 20 MG tablet TAKE 3 TABLETS (60 MG TOTAL) BY MOUTH DAILY WITH BREAKFAST. 63 tablet 1   rosuvastatin  (CRESTOR ) 20 MG tablet Take 1 tablet (20 mg total) by mouth daily. 90 tablet 3   TYLENOL  500 MG tablet Take 500 mg by mouth every 6 (six) hours as needed for mild pain (pain score 1-3) or headache.     warfarin (COUMADIN ) 2 MG tablet Take 2-4 mg by mouth daily. Take 4mg  Tue/Thr, 2mg  all other days OR as directed     No current facility-administered medications for this visit.     Past Medical History:  Diagnosis Date   A-fib (HCC)  Atrial flutter with rapid ventricular response (HCC) 03/16/2021   Cholelithiasis 2011   Chronic anticoagulation 03/03/2023   Colon, diverticulosis    H/O heart valve replacement with mechanical valve 03/03/2023   Bjork-Shiley tilting disc valve in 1983 at [facility] in [place], [place] .        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   Stricture of sigmoid colon s/p colectomy 2007 2007    Past Surgical History:  Procedure Laterality Date    ATRIAL FIBRILLATION ABLATION     BIOPSY  03/05/2023   Procedure: BIOPSY;  Surgeon: Saintclair Jasper, MD;  Location: [facility];  Service: Gastroenterology;;   CARDIOVERSION N/A 03/13/2023   Procedure: CARDIOVERSION;  Surgeon: Alvan Ronal BRAVO, MD;  Location: [facility];  Service: Cardiovascular;  Laterality: N/A;   CARDIOVERSION N/A 03/13/2023   Procedure: CARDIOVERSION;  Surgeon: Alvan Ronal BRAVO, MD;  Location: [facility];  Service: Cardiovascular;  Laterality: N/A;   CARDIOVERSION N/A 05/07/2024   Procedure: CARDIOVERSION;  Surgeon: Sheena Pugh, DO;  Location: [facility];  Service: Cardiovascular;  Laterality: N/A;   COLON RESECTION SIGMOID  07/26/2005   Dr Eletha for diverticular sigmoid colon stricture   ESOPHAGOGASTRODUODENOSCOPY (EGD) WITH PROPOFOL  N/A 03/05/2023   Procedure: ESOPHAGOGASTRODUODENOSCOPY (EGD) WITH PROPOFOL ;  Surgeon: Saintclair Jasper, MD;  Location: [facility];  Service: Gastroenterology;  Laterality: N/A;   HEMOSTASIS CLIP PLACEMENT  03/05/2023   Procedure: HEMOSTASIS CLIP PLACEMENT;  Surgeon: Saintclair Jasper, MD;  Location: [facility];  Service: Gastroenterology;;   LAPAROSCOPIC INGUINAL HERNIA REPAIR Bilateral 04/03/1999   Dr Fredrik   VALVE REPLACEMENT      Social History   Socioeconomic History   Marital status: Married    Spouse name: Not on file   Number of children: Not on file   Years of education: Not on file   Highest education level: Not on file  Occupational History   Not on file  Tobacco Use   Smoking status: Some Days    Types: Cigars   Smokeless tobacco: Never   Tobacco comments:    Cigars once a week use  Vaping Use   Vaping status: Never Used  Substance and Sexual Activity   Alcohol use: Not Currently   Drug use: Never   Sexual activity: Not on file  Other Topics Concern   Not on file  Social History Narrative   Right handed    Lives with family    Retired    Drinks a etoh free beer a day    Social Drivers of  Health   Tobacco Use: High Risk (05/04/2024)   Patient History    Smoking Tobacco Use: Some Days    Smokeless Tobacco Use: Never    Passive Exposure: Not on file  Financial Resource Strain: Not on file  Food Insecurity: No Food Insecurity (05/04/2024)   Epic    Worried About Programme Researcher, Broadcasting/film/video in the Last Year: Never true    Ran Out of Food in the Last Year: Never true  Transportation Needs: No Transportation Needs (05/04/2024)   Epic    Lack of Transportation (Medical): No    Lack of Transportation (Non-Medical): No  Physical Activity: Not on file  Stress: Not on file  Social Connections: Socially Integrated (05/04/2024)   Social Connection and Isolation Panel    Frequency of Communication with Friends and Family: More than three times a week    Frequency of Social Gatherings with Friends and Family:  Three times a week    Attends Religious Services: More than 4 times per year    Active Member of Clubs or Organizations: Yes    Attends Banker Meetings: More than 4 times per year    Marital Status: Married  Catering Manager Violence: Not At Risk (05/04/2024)   Epic    Fear of Current or Ex-Partner: No    Emotionally Abused: No    Physically Abused: No    Sexually Abused: No  Depression (PHQ2-9): Low Risk (11/06/2023)   Depression (PHQ2-9)    PHQ-2 Score: 0  Alcohol Screen: Not on file  Housing: Low Risk (05/04/2024)   Epic    Unable to Pay for Housing in the Last Year: No    Number of Times Moved in the Last Year: 0    Homeless in the Last Year: No  Utilities: Not At Risk (05/04/2024)   Epic    Threatened with loss of utilities: No  Health Literacy: Not on file    No family history on file.  ROS: no fevers or chills, productive cough, hemoptysis, dysphasia, odynophagia, melena, hematochezia, dysuria, hematuria, rash, seizure activity, orthopnea, PND, pedal edema, claudication. Remaining systems are negative.  Physical Exam: Well-developed well-nourished in no  acute distress.  Skin is warm and dry.  HEENT is normal.  Neck is supple.  Chest is clear to auscultation with normal expansion.  Cardiovascular exam is regular rate and rhythm.  Abdominal exam nontender or distended. No masses palpated. Extremities show no edema. neuro grossly intact  ECG- personally reviewed  A/P  1 paroxysmal atrial fibrillation/flutter-patient is status post cardioversion and is holding sinus rhythm.  Continue amiodarone  and Coumadin .  2 status post aortic valve replacement-continue Coumadin .  Continue SBE prophylaxis.  3 history of mitral stenosis/mitral regurgitation-previously reviewed with Dr. Dede.  May need evaluation by structural team in the future but sarcoid/thrombocytopenia would need to be addressed first.  Will repeat echocardiogram in July.  He remains asymptomatic.  4 cardiomyopathy-mild LV dysfunction on recent most echocardiogram.  5 coronary calcification-he denies chest pain.  Continue statin.  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  Redell Shallow, MD    "

## 2024-06-02 ENCOUNTER — Ambulatory Visit: Payer: Self-pay | Admitting: Cardiology

## 2024-06-05 ENCOUNTER — Inpatient Hospital Stay

## 2024-06-05 ENCOUNTER — Inpatient Hospital Stay: Admitting: Hematology and Oncology

## 2024-08-27 ENCOUNTER — Ambulatory Visit: Admitting: Neurology
